# Patient Record
Sex: Female | Born: 1970 | Race: Black or African American | Hispanic: No | Smoking: Never smoker
Health system: Southern US, Community
[De-identification: ages and names within clinical notes are randomized; demographics above are authoritative.]

## PROBLEM LIST (undated history)

## (undated) DIAGNOSIS — Z5189 Encounter for other specified aftercare: Secondary | ICD-10-CM

## (undated) DIAGNOSIS — G4700 Insomnia, unspecified: Secondary | ICD-10-CM

## (undated) DIAGNOSIS — C801 Malignant (primary) neoplasm, unspecified: Secondary | ICD-10-CM

## (undated) DIAGNOSIS — F32A Depression, unspecified: Secondary | ICD-10-CM

## (undated) DIAGNOSIS — R5383 Other fatigue: Secondary | ICD-10-CM

## (undated) DIAGNOSIS — R002 Palpitations: Secondary | ICD-10-CM

## (undated) DIAGNOSIS — M545 Low back pain, unspecified: Secondary | ICD-10-CM

## (undated) DIAGNOSIS — T7840XA Allergy, unspecified, initial encounter: Secondary | ICD-10-CM

## (undated) DIAGNOSIS — K219 Gastro-esophageal reflux disease without esophagitis: Secondary | ICD-10-CM

## (undated) DIAGNOSIS — D649 Anemia, unspecified: Secondary | ICD-10-CM

## (undated) DIAGNOSIS — R251 Tremor, unspecified: Secondary | ICD-10-CM

## (undated) DIAGNOSIS — E876 Hypokalemia: Secondary | ICD-10-CM

## (undated) DIAGNOSIS — R000 Tachycardia, unspecified: Secondary | ICD-10-CM

## (undated) DIAGNOSIS — I10 Essential (primary) hypertension: Secondary | ICD-10-CM

## (undated) DIAGNOSIS — Z6839 Body mass index (BMI) 39.0-39.9, adult: Secondary | ICD-10-CM

## (undated) DIAGNOSIS — J45909 Unspecified asthma, uncomplicated: Secondary | ICD-10-CM

## (undated) DIAGNOSIS — E669 Obesity, unspecified: Secondary | ICD-10-CM

## (undated) DIAGNOSIS — M461 Sacroiliitis, not elsewhere classified: Secondary | ICD-10-CM

## (undated) DIAGNOSIS — R899 Unspecified abnormal finding in specimens from other organs, systems and tissues: Secondary | ICD-10-CM

## (undated) DIAGNOSIS — R7303 Prediabetes: Secondary | ICD-10-CM

## (undated) DIAGNOSIS — N189 Chronic kidney disease, unspecified: Secondary | ICD-10-CM

## (undated) DIAGNOSIS — F419 Anxiety disorder, unspecified: Secondary | ICD-10-CM

## (undated) DIAGNOSIS — R350 Frequency of micturition: Secondary | ICD-10-CM

## (undated) DIAGNOSIS — R011 Cardiac murmur, unspecified: Secondary | ICD-10-CM

## (undated) DIAGNOSIS — Z90710 Acquired absence of both cervix and uterus: Secondary | ICD-10-CM

## (undated) DIAGNOSIS — Z9889 Other specified postprocedural states: Secondary | ICD-10-CM

## (undated) DIAGNOSIS — R112 Nausea with vomiting, unspecified: Secondary | ICD-10-CM

## (undated) DIAGNOSIS — M1990 Unspecified osteoarthritis, unspecified site: Secondary | ICD-10-CM

## (undated) DIAGNOSIS — F329 Major depressive disorder, single episode, unspecified: Secondary | ICD-10-CM

## (undated) HISTORY — DX: Unspecified abnormal finding in specimens from other organs, systems and tissues: R89.9

## (undated) HISTORY — DX: Low back pain: M54.5

## (undated) HISTORY — DX: Gastro-esophageal reflux disease without esophagitis: K21.9

## (undated) HISTORY — DX: Essential (primary) hypertension: I10

## (undated) HISTORY — DX: Encounter for other specified aftercare: Z51.89

## (undated) HISTORY — DX: Hypokalemia: E87.6

## (undated) HISTORY — DX: Acquired absence of both cervix and uterus: Z90.710

## (undated) HISTORY — DX: Unspecified osteoarthritis, unspecified site: M19.90

## (undated) HISTORY — DX: Cardiac murmur, unspecified: R01.1

## (undated) HISTORY — DX: Body mass index (BMI) 39.0-39.9, adult: Z68.39

## (undated) HISTORY — DX: Insomnia, unspecified: G47.00

## (undated) HISTORY — DX: Frequency of micturition: R35.0

## (undated) HISTORY — DX: Allergy, unspecified, initial encounter: T78.40XA

## (undated) HISTORY — DX: Obesity, unspecified: E66.9

## (undated) HISTORY — DX: Low back pain, unspecified: M54.50

## (undated) HISTORY — DX: Chronic kidney disease, unspecified: N18.9

## (undated) HISTORY — DX: Anxiety disorder, unspecified: F41.9

## (undated) HISTORY — DX: Palpitations: R00.2

## (undated) HISTORY — PX: TOOTH EXTRACTION: SUR596

## (undated) HISTORY — PX: COLON SURGERY: SHX602

## (undated) HISTORY — DX: Anemia, unspecified: D64.9

## (undated) HISTORY — DX: Sacroiliitis, not elsewhere classified: M46.1

## (undated) HISTORY — PX: APPENDECTOMY: SHX54

---

## 2016-11-05 ENCOUNTER — Encounter: Payer: Self-pay | Admitting: Cardiology

## 2016-12-06 ENCOUNTER — Ambulatory Visit (INDEPENDENT_AMBULATORY_CARE_PROVIDER_SITE_OTHER): Payer: Medicaid Other | Admitting: Cardiology

## 2016-12-06 ENCOUNTER — Encounter: Payer: Self-pay | Admitting: Cardiology

## 2016-12-06 ENCOUNTER — Encounter (INDEPENDENT_AMBULATORY_CARE_PROVIDER_SITE_OTHER): Payer: Self-pay

## 2016-12-06 VITALS — BP 156/100 | HR 77 | Ht 67.0 in | Wt 271.8 lb

## 2016-12-06 DIAGNOSIS — R002 Palpitations: Secondary | ICD-10-CM

## 2016-12-06 DIAGNOSIS — I10 Essential (primary) hypertension: Secondary | ICD-10-CM | POA: Diagnosis not present

## 2016-12-06 DIAGNOSIS — R400 Somnolence: Secondary | ICD-10-CM

## 2016-12-06 DIAGNOSIS — R0683 Snoring: Secondary | ICD-10-CM | POA: Diagnosis not present

## 2016-12-06 DIAGNOSIS — R001 Bradycardia, unspecified: Secondary | ICD-10-CM | POA: Diagnosis not present

## 2016-12-06 MED ORDER — CARVEDILOL 12.5 MG PO TABS: 12.5000 mg | ORAL_TABLET | Freq: Two times a day (BID) | ORAL | 3 refills | Status: DC

## 2016-12-06 NOTE — Progress Notes (Signed)
Faxed OV notes to PCP. Faxed to Dr. Jason Fila at [facility] at [phone]

## 2016-12-06 NOTE — Progress Notes (Signed)
Electrophysiology Office Note   Date:  [id number]   ID:  XICLALY ELSAESSER, DOB 24-May-1971, MRN [id number]  PCP:  Pcp Not In System Primary Electrophysiologist:  Verdie Barrows Meredith Leeds, MD    Chief Complaint  Patient presents with  . CONSULT    Sinis Bradycardia     History of Present Illness: Erin Bradford is a 45 y.o. female who presents today for electrophysiology evaluation.   She has a history of hypertension, obesity, palpitations, and chronic hypokalemia. She had an electrocardiogram at her primary physician's office that showed sinus rhythm with inferior T-wave inversions. Her heart rate at the time was 58. She says that her blood pressure is been quite difficult to control. She recently went to a nephrologist who put her on Aldactone, as she is also been hypokalemic. She says that she does snore at night, and has been told in the past that she stops breathing when she sleeps. She gets headaches most days, including morning headaches, and does have daytime somnolence.   Today, she denies symptoms of palpitations, chest pain, shortness of breath, orthopnea, PND, lower extremity edema, claudication, dizziness, presyncope, syncope, bleeding, or neurologic sequela. The patient is tolerating medications without difficulties and is otherwise without complaint today.    Past Medical History:  Diagnosis Date  . Abnormal laboratory test   . Anemia   . Anxiety   . BMI 39.0-39.9,adult   . Chronic hypokalemia   . Hypertension   . Insomnia   . Low back pain   . Obesity   . Palpitations    Past Surgical History:  Procedure Laterality Date  . CESAREAN SECTION    . TOOTH EXTRACTION       Current Outpatient Prescriptions  Medication Sig Dispense Refill  . buPROPion (WELLBUTRIN XL) 150 MG 24 hr tablet TAKE TWO TABLETS BY MOUTH  DAILY    . busPIRone (BUSPAR) 5 MG tablet Take 5 mg by mouth daily.    . cetirizine (ZYRTEC) 10 MG tablet Take 10 mg by mouth daily.    Marland Kitchen FERROUS  FUMARATE ER PO Take 50 mg by mouth daily.    . furosemide (LASIX) 20 MG tablet Take 20 mg by mouth daily.    Marland Kitchen losartan (COZAAR) 100 MG tablet Take 100 mg by mouth daily.    . methocarbamol (ROBAXIN) 750 MG tablet Take 750 mg by mouth.    . potassium chloride SA (K-DUR,KLOR-CON) 20 MEQ tablet Take 20 mEq by mouth daily.    Marland Kitchen spironolactone (ALDACTONE) 12.5 mg TABS tablet Take 12.5 mg by mouth 2 (two) times daily.    . traMADol (ULTRAM) 50 MG tablet Take 50 mg by mouth 2 (two) times daily.    Marland Kitchen zolpidem (AMBIEN) 10 MG tablet Take 10 mg by mouth at bedtime as needed for sleep.    . carvedilol (COREG) 12.5 MG tablet Take 1 tablet (12.5 mg total) by mouth 2 (two) times daily. 60 tablet 3   No current facility-administered medications for this visit.     Allergies:   Ace inhibitors   Social History:  The patient  reports that she has never smoked. She has never used smokeless tobacco. She reports that she does not drink alcohol or use drugs.   Family History:  The patient's family history includes Atrial fibrillation in her sister; Cancer in her maternal grandfather; Diabetes in her maternal grandmother and mother; Hyperlipidemia in her mother; Hypertension in her maternal grandmother and mother; Kidney disease in her father and maternal  grandmother; Pancreatic cancer in her maternal grandmother; Sarcoidosis in her mother.    ROS:  Please see the history of present illness.   Otherwise, review of systems is positive for none.   All other systems are reviewed and negative.    PHYSICAL EXAM: VS:  BP (!) 156/100   Pulse 77   Ht 5\' 7"  (1.702 m)   Wt 271 lb 12.8 oz (123.3 kg)   BMI 42.57 kg/m  , BMI Body mass index is 42.57 kg/m. GEN: Well nourished, well developed, in no acute distress  HEENT: normal  Neck: no JVD, carotid bruits, or masses Cardiac: RRR; no murmurs, rubs, or gallops,no edema  Respiratory:  clear to auscultation bilaterally, normal work of breathing GI: soft, nontender,  nondistended, + BS MS: no deformity or atrophy  Skin: warm and dry,  Neuro:  Strength and sensation are intact Psych: euthymic mood, full affect  EKG:  EKG is ordered today. Personal review of the ekg ordered shows sinus rhythm, rate 77  Recent Labs: No results found for requested labs within last 8760 hours.    Lipid Panel  No results found for: CHOL, TRIG, HDL, CHOLHDL, VLDL, LDLCALC, LDLDIRECT   Wt Readings from Last 3 Encounters:  12/06/16 271 lb 12.8 oz (123.3 kg)      Other studies Reviewed: Additional studies/ records that were reviewed today include: PCP notes  ASSESSMENT AND PLAN:  1.  Palpitations: Currently happened for up to 30 seconds at a time a few times a week. We'll place a two-week monitor for further determine the cause of her palpitations.  2. Hypertension: Blood pressure is elevated today, but apparently improved since she saw her neurologist, who put her on Aldactone. Erin Bradford start her on carvedilol 12.5 mg twice a day, which may help to improve her blood pressure. She has been relatively bradycardic in the past, but according to her she did not have any symptoms from this.  3. Snoring: She does have risk factors for sleep apnea including snoring, obesity, daytime somnolence, headaches. She is also been told that she stops breathing when she sleeps. Erin Bradford order a sleep study. If she does have sleep apnea, therapy may help improve her blood pressure.  Current medicines are reviewed at length with the patient today.   The patient does not have concerns regarding her medicines.  The following changes were made today:  Coreg 12.5 mg BID  Labs/ tests ordered today include:  Orders Placed This Encounter  Procedures  . Cardiac event monitor  . EKG 12-Lead  . Split night study     Disposition:   FU with Erin Bradford 6 weeks  Signed, Erin Bradford Meredith Leeds, MD  12/06/2016 10:50 AM     [facility] [address] [place]  [id number] [phone] (office) [phone] (fax).inic

## 2016-12-06 NOTE — Patient Instructions (Addendum)
Medication Instructions:   Start Carvedilol 12.5 mg twice daily  --- If you need a refill on your cardiac medications before your next appointment, please call your pharmacy. ---  Labwork:  None ordered  Testing/Procedures: Your physician has recommended that you wear an event monitor - 2 weeks. Event monitors are medical devices that record the heart's electrical activity. Doctors most often [place] these monitors to diagnose arrhythmias. Arrhythmias are problems with the speed or rhythm of the heartbeat. The monitor is a small, portable device. You can wear one while you do your normal daily activities. This is usually used to diagnose what is causing palpitations/syncope (passing out).  Your physician has recommended that you have a sleep study. This test records several body functions during sleep, including: brain activity, eye movement, oxygen and carbon dioxide blood levels, heart rate and rhythm, breathing rate and rhythm, the flow of air through your mouth and nose, snoring, body muscle movements, and chest and belly movement.    Follow-Up: Your physician wants you to follow-up in:6 weeks with Dr. Curt Bears.    Thank you for choosing [facility]!!   Erin Curet, RN [phone]   Any Other Special Instructions Will Be Listed Below (If Applicable).  Cardiac Event Monitoring A cardiac event monitor is a small recording device used to help detect abnormal heart rhythms (arrhythmias). The monitor is used to record heart rhythm when noticeable symptoms such as the following occur:  Fast heartbeats (palpitations), such as heart racing or fluttering.  Dizziness.  Fainting or light-headedness.  Unexplained weakness. The monitor is wired to two electrodes placed on your chest. Electrodes are flat, sticky disks that attach to your skin. The monitor can be worn for up to 30 days. You will wear the monitor at all times, except when bathing. How to use your cardiac event monitor A  technician will prepare your chest for the electrode placement. The technician will show you how to place the electrodes, how to work the monitor, and how to replace the batteries. Take time to practice using the monitor before you leave the office. Make sure you understand how to send the information from the monitor to your health care provider. This requires a telephone with a landline, not a cell phone. You need to:  Wear your monitor at all times, except when you are in water:  Do not get the monitor wet.  Take the monitor off when bathing. Do not swim or use a hot tub with it on.  Keep your skin clean. Do not put body lotion or moisturizer on your chest.  Change the electrodes daily or any time they stop sticking to your skin. You might need to use tape to keep them on.  It is possible that your skin under the electrodes could become irritated. To keep this from happening, try to put the electrodes in slightly different places on your chest. However, they must remain in the area under your left breast and in the upper right section of your chest.  Make sure the monitor is safely clipped to your clothing or in a location close to your body that your health care provider recommends.  Press the button to record when you feel symptoms of heart trouble, such as dizziness, weakness, light-headedness, palpitations, thumping, shortness of breath, unexplained weakness, or a fluttering or racing heart. The monitor is always on and records what happened slightly before you pressed the button, so do not worry about being too late to get good information.  Keep a diary of your activities, such as walking, doing chores, and taking medicine. It is especially important to note what you were doing when you pushed the button to record your symptoms. This will help your health care provider determine what might be contributing to your symptoms. The information stored in your monitor will be reviewed by your  health care provider alongside your diary entries.  Send the recorded information as recommended by your health care provider. It is important to understand that it will take some time for your health care provider to process the results.  Change the batteries as recommended by your health care provider. Get help right away if:  You have chest pain.  You have extreme difficulty breathing or shortness of breath.  You develop a very fast heartbeat that persists.  You develop dizziness that does not go away.  You faint or constantly feel you are about to faint. This information is not intended to replace advice given to you by your health care provider. Make sure you discuss any questions you have with your health care provider. Document Released: 09/21/2008 Document Revised: 05/20/2016 Document Reviewed: 06/11/2013 Elsevier Interactive Patient Education  2017 [employer].    Sleep Studies A sleep study (polysomnogram) is a series of tests done while you are sleeping. It can show how well you sleep. This can help your health care provider diagnose a sleep disorder and show how severe your sleep disorder is. A sleep study may lead to treatment that will help you sleep better and prevent other medical problems caused by poor sleep. If you have a sleep disorder, you may also be at risk for: Sleep-related accidents. High blood pressure. Heart disease. Stroke. Other medical conditions. Sleep disorders are common. Your health care provider may suspect a sleep disorder if you: Have loud snoring most nights. Have brief periods when you stop breathing at night. Feel sleepy on most days. Fall asleep suddenly during the day. Have trouble falling asleep or staying asleep. Feel like you need to move your legs when trying to fall asleep. Have dreams that seem very real shortly after falling asleep. Feel like you cannot move when you first wake up. Which tests will I need to have? Most sleep  studies last all night and include these tests: Recordings of your brain activity. Recordings of your eye movements. Recording of your heart rate and rhythm. Blood pressure readings. Readings of the amount of oxygen in your blood. Measurements of your chest and belly movement as you breathe during sleep. If you have signs of the sleep disorder called sleep apnea during your test, you may get a mask to wear for the second half of the night. The mask provides continuous positive airway pressure (CPAP). This may improve sleep apnea significantly. You will then have all tests done again with the mask in place to see if your measurements and recordings change. How are sleep studies done? Most sleep studies are done over one full night of sleep. You will arrive at the study center in the evening and can go home in the morning. Bring your pajamas and toothbrush. Do not have caffeine on the day of your sleep study. Your health care provider will let you know if you need to stop taking any of your regular medicines before the test. To do the tests included in a polysomnogram, you will have: Round, sticky patches with sensors attached to recording wires (electrodes) placed on your scalp, face, chest, and limbs. Wires from  all the electrodes and sensors run from your bed to a computer. The wires can be taken off and put back on if you need to get out of bed to go to the bathroom. A sensor placed over your nose to measure airflow. A finger clip put on one finger to measure your blood oxygen level. A belt around your belly and a belt around your chest to measure breathing movements. Where are sleep studies done? Sleep studies are done at sleep centers. A sleep center may be inside a hospital, office, or clinic. The room where you have the study may look like a hospital room or a hotel room. The health care providers doing the study may come in and out of the room during the study. Most of the time, they  will be in another room monitoring your test. How is information from sleep studies helpful? A polysomnogram can be used along with your medical history and a physical exam to diagnose conditions, such as: Sleep apnea. Restless legs syndrome. Sleep-related seizure disorders. Sleep-related movement disorders. A medical doctor who specializes in sleep will evaluate your sleep study. The specialist will share the results with your primary health care provider. Treatments based on your sleep study may include: Improving your sleep habits (sleep hygiene). Wearing a CPAP mask. Wearing an oral device at night to improve breathing and reduce snoring. Taking medicine for: Restless legs syndrome. Sleep-related seizure disorder. Sleep-related movement disorder. This information is not intended to replace advice given to you by your health care provider. Make sure you discuss any questions you have with your health care provider. Document Released: 06/19/2003 Document Revised: 08/08/2016 Document Reviewed: 02/18/2014 Elsevier Interactive Patient Education  2017 [employer].

## 2016-12-09 ENCOUNTER — Telehealth: Payer: Self-pay | Admitting: Oncology

## 2016-12-09 ENCOUNTER — Encounter: Payer: Self-pay | Admitting: Oncology

## 2016-12-09 NOTE — Telephone Encounter (Signed)
Pt confirmed appt, verified demo and insurance, mailed pt letter, fax  referring provider with appt date/time.

## 2016-12-10 ENCOUNTER — Encounter: Payer: Self-pay | Admitting: Cardiology

## 2016-12-10 NOTE — Telephone Encounter (Signed)
Advised pt it is not abnormal to have some fatigue/weakenss/dizziness when first starting a beta blocker.  We discussed that she experienced this before starting but after starting it "felt different than before and that worried me". Discussed medication and that with time usually (but not always) these initial symptoms subside. We discussed normal BP numbers also.  She was unaware of what normal is.  She also realized while we were on the phone that she had been taking in once daily and not BID. Pt is going to continue taking it, correctly, and I am going to follow up with her next week to see how she is doing.  If no better then we will discuss possible switching  medications. She thanks me for calling and setting her mind at ease.

## 2016-12-14 ENCOUNTER — Ambulatory Visit (INDEPENDENT_AMBULATORY_CARE_PROVIDER_SITE_OTHER): Payer: Medicaid Other

## 2016-12-14 ENCOUNTER — Telehealth: Payer: Self-pay | Admitting: Cardiology

## 2016-12-14 DIAGNOSIS — R002 Palpitations: Secondary | ICD-10-CM

## 2016-12-14 NOTE — Telephone Encounter (Signed)
Advised pt to continue normal ADLs/exercising while wearing monitor. She thanks me for calling.

## 2016-12-14 NOTE — Telephone Encounter (Signed)
Erin Bradford is calling to find is it ok to exercise with the heart monitor on . Please call . Thanks

## 2016-12-24 NOTE — Telephone Encounter (Signed)
Follow up with patient.  She reports BPs are better.  Last was 124/82. Fatigue/weakness slightly improved. Pt will continue to monitor and call office if there is a change or fatigue worsens.  Otherwise she will keep follow up in February. She thanks me for calling and checking in.

## 2017-01-03 ENCOUNTER — Encounter: Payer: Self-pay | Admitting: Oncology

## 2017-01-03 ENCOUNTER — Telehealth: Payer: Self-pay | Admitting: Oncology

## 2017-01-03 NOTE — Telephone Encounter (Signed)
Appt has been rescheduled to 2/9 at 2pm. Provider will be on-call on 1/19. Pt agreed to the appt date and time. Letter mailed

## 2017-01-07 ENCOUNTER — Telehealth: Payer: Self-pay | Admitting: *Deleted

## 2017-01-07 NOTE — Telephone Encounter (Signed)
Called to move appt to sooner date.  Pt took a look at her schedule and is "stretched".  She will keep currently scheduled appt on 2/2 and call if needs to be seen sooner.  She thanks me for my help  Notes Recorded by Stanton Kidney, RN on 01/07/2017 at 9:24 AM EST Pt would like OV to discuss further with Dr. Curt Bears. Will call pt later today to arrange sooner OV. ------  Notes Recorded by Stanton Kidney, RN on 12/31/2016 at 9:59 AM EST Reviewed results with patient who verbalized understanding. Pt still having daily palpitations that she feels. Will discuss with Camnitz further and call pt back with updated plan of care/recommendations and/or sooner follow up appt. Pt is agreeable to plan and understands it may be next week before calling her back. ------  Notes Recorded by Will Meredith Leeds, MD on 12/31/2016 at 9:37 AM EST Monitor without arrhythmia. Palpitations associated with sinus rhythm.

## 2017-01-14 ENCOUNTER — Encounter: Payer: Medicaid Other | Admitting: Oncology

## 2017-01-25 ENCOUNTER — Ambulatory Visit: Payer: Self-pay | Admitting: Neurology

## 2017-01-25 DIAGNOSIS — IMO0001 Reserved for inherently not codable concepts without codable children: Secondary | ICD-10-CM | POA: Insufficient documentation

## 2017-01-25 DIAGNOSIS — E669 Obesity, unspecified: Secondary | ICD-10-CM | POA: Diagnosis not present

## 2017-01-25 DIAGNOSIS — Z6841 Body Mass Index (BMI) 40.0 and over, adult: Secondary | ICD-10-CM | POA: Diagnosis not present

## 2017-01-25 DIAGNOSIS — M545 Low back pain: Secondary | ICD-10-CM

## 2017-01-25 DIAGNOSIS — R251 Tremor, unspecified: Secondary | ICD-10-CM | POA: Insufficient documentation

## 2017-01-25 DIAGNOSIS — R600 Localized edema: Secondary | ICD-10-CM | POA: Diagnosis not present

## 2017-01-25 DIAGNOSIS — G8929 Other chronic pain: Secondary | ICD-10-CM | POA: Insufficient documentation

## 2017-01-25 DIAGNOSIS — I10 Essential (primary) hypertension: Secondary | ICD-10-CM | POA: Diagnosis not present

## 2017-01-25 DIAGNOSIS — F329 Major depressive disorder, single episode, unspecified: Secondary | ICD-10-CM | POA: Diagnosis not present

## 2017-01-26 ENCOUNTER — Telehealth: Payer: Self-pay | Admitting: Cardiology

## 2017-01-26 ENCOUNTER — Encounter (HOSPITAL_BASED_OUTPATIENT_CLINIC_OR_DEPARTMENT_OTHER): Payer: Medicaid Other

## 2017-01-26 NOTE — Telephone Encounter (Signed)
Pt called to rs 01-28-17 appt with Camnitz, however she requested 2-13 or 2-16 and he is not in either of those days. She then requested to be transferred to the nurse-I tols her I would have the nurse call her back-she agreed-pls call [phone]

## 2017-01-26 NOTE — Telephone Encounter (Signed)
Pt unable to make appt on Friday and unable to schedule appt in the near future.  She is asking if Camnitz would recommend a medication for her tachy episodes/palpitations. I will forward to him for his review and call her this week w/ his response.  Pt understands and is agreeable.

## 2017-01-27 NOTE — Telephone Encounter (Signed)
Informed patient Dr. Curt Bears needs to meet with her in the office before prescribing any medication.  Pt understands and is agreeable.  She cannot make appt tomorrow -- she is re-scheduled for 2/20.

## 2017-01-28 ENCOUNTER — Ambulatory Visit: Payer: Medicaid Other | Admitting: Cardiology

## 2017-01-29 ENCOUNTER — Encounter: Payer: Self-pay | Admitting: Neurology

## 2017-01-31 ENCOUNTER — Encounter (HOSPITAL_BASED_OUTPATIENT_CLINIC_OR_DEPARTMENT_OTHER): Payer: Medicaid Other

## 2017-02-04 ENCOUNTER — Ambulatory Visit (HOSPITAL_BASED_OUTPATIENT_CLINIC_OR_DEPARTMENT_OTHER): Payer: Medicaid Other | Admitting: Oncology

## 2017-02-04 ENCOUNTER — Ambulatory Visit (HOSPITAL_BASED_OUTPATIENT_CLINIC_OR_DEPARTMENT_OTHER): Payer: Medicaid Other

## 2017-02-04 ENCOUNTER — Telehealth: Payer: Self-pay | Admitting: *Deleted

## 2017-02-04 ENCOUNTER — Telehealth: Payer: Self-pay | Admitting: Oncology

## 2017-02-04 DIAGNOSIS — D500 Iron deficiency anemia secondary to blood loss (chronic): Secondary | ICD-10-CM

## 2017-02-04 DIAGNOSIS — E876 Hypokalemia: Secondary | ICD-10-CM | POA: Diagnosis not present

## 2017-02-04 DIAGNOSIS — D509 Iron deficiency anemia, unspecified: Secondary | ICD-10-CM | POA: Insufficient documentation

## 2017-02-04 DIAGNOSIS — N920 Excessive and frequent menstruation with regular cycle: Secondary | ICD-10-CM

## 2017-02-04 DIAGNOSIS — I10 Essential (primary) hypertension: Secondary | ICD-10-CM

## 2017-02-04 LAB — COMPREHENSIVE METABOLIC PANEL
ALBUMIN: 3.8 g/dL (ref 3.5–5.0)
ALT: 20 U/L (ref 0–55)
AST: 17 U/L (ref 5–34)
Alkaline Phosphatase: 90 U/L (ref 40–150)
Anion Gap: 7 mEq/L (ref 3–11)
BUN: 8.9 mg/dL (ref 7.0–26.0)
CHLORIDE: 105 meq/L (ref 98–109)
CO2: 26 mEq/L (ref 22–29)
CREATININE: 1 mg/dL (ref 0.6–1.1)
Calcium: 9.2 mg/dL (ref 8.4–10.4)
EGFR: 80 mL/min/{1.73_m2} — ABNORMAL LOW (ref >90)
GLUCOSE: 85 mg/dL (ref 70–140)
POTASSIUM: 3.8 meq/L (ref 3.5–5.1)
SODIUM: 138 meq/L (ref 136–145)
Total Bilirubin: <0.22 mg/dL (ref 0.20–1.20)
Total Protein: 6.8 g/dL (ref 6.4–8.3)

## 2017-02-04 LAB — CBC WITH DIFFERENTIAL/PLATELET
BASO%: 0.3 % (ref 0.0–2.0)
BASOS ABS: 0 10*3/uL (ref 0.0–0.1)
EOS%: 2.3 % (ref 0.0–7.0)
Eosinophils Absolute: 0.2 10*3/uL (ref 0.0–0.5)
HEMATOCRIT: 33.9 % — AB (ref 34.8–46.6)
HEMOGLOBIN: 11 g/dL — AB (ref 11.6–15.9)
LYMPH#: 2.6 10*3/uL (ref 0.9–3.3)
LYMPH%: 29.8 % (ref 14.0–49.7)
MCH: 28.3 pg (ref 25.1–34.0)
MCHC: 32.4 g/dL (ref 31.5–36.0)
MCV: 87.1 fL (ref 79.5–101.0)
MONO#: 1.1 10*3/uL — ABNORMAL HIGH (ref 0.1–0.9)
MONO%: 13 % (ref 0.0–14.0)
NEUT#: 4.7 10*3/uL (ref 1.5–6.5)
NEUT%: 54.6 % (ref 38.4–76.8)
Platelets: 433 10*3/uL — ABNORMAL HIGH (ref 145–400)
RBC: 3.89 10*6/uL (ref 3.70–5.45)
RDW: 14.9 % — AB (ref 11.2–14.5)
WBC: 8.6 10*3/uL (ref 3.9–10.3)

## 2017-02-04 NOTE — Telephone Encounter (Signed)
Per 2/9 LOS I have scheduled appats and notified the scheduler

## 2017-02-04 NOTE — Progress Notes (Signed)
Reason for Referral: Anemia.   HPI: 46 year old woman currently of [place] where she has relocated recently. She lived in multiple areas including [place] and [place]. She was seen by hematology while she was living in [place] for the management of iron deficiency anemia. At that time she was noted to have iron deficiency related to menorrhagia. She was prescribed oral iron although it has not been very successful in treating her anemia. In the interim, she relocated to [facility] and light established follow-up locally. She reports that her symptoms of anemia have been chronic and dating back from last 30 years. Her menstrual cycles have been heavy since her last pregnancy 27 years ago. She has been intermittently on oral iron although never required transfusions or IV iron infusion. Most recently, she is reporting heavier menstrual cycles with menorrhagia related to uterine fibroids. She does report fatigue, tiredness and occasional headaches. She does not report any chest pain but does report some occasional dyspnea exertion. She does report some occasional dizziness. She denies any hematochezia or melena.  She does not report any blurry vision, syncope or seizures. She does not report any fevers, chills, sweats or weight loss. She does not report any chest pain, palpitation, orthopnea or leg edema. She does not report any cough, wheezing or hemoptysis. She does not report any nausea, vomiting or abdominal pain. She does not report any frequency urgency or hesitancy. She does not report any skeletal complaints. Remaining review of systems unremarkable.   Past Medical History:  Diagnosis Date  . Abnormal laboratory test   . Anemia   . Anxiety   . BMI 39.0-39.9,adult   . Chronic hypokalemia   . Hypertension   . Insomnia   . Low back pain   . Obesity   . Palpitations   :  Past Surgical History:  Procedure Laterality Date  . CESAREAN SECTION    . TOOTH EXTRACTION     :   Current Outpatient Prescriptions:  .  buPROPion (WELLBUTRIN XL) 150 MG 24 hr tablet, TAKE TWO TABLETS BY MOUTH  DAILY, Disp: , Rfl:  .  carvedilol (COREG) 12.5 MG tablet, Take 1 tablet (12.5 mg total) by mouth 2 (two) times daily., Disp: 60 tablet, Rfl: 3 .  cetirizine (ZYRTEC) 10 MG tablet, Take 10 mg by mouth daily., Disp: , Rfl:  .  FeFum-FePoly-FA-B Cmp-C-Biot (INTEGRA PLUS PO), Take 1 tablet by mouth daily., Disp: , Rfl:  .  FERROUS FUMARATE ER PO, Take 50 mg by mouth daily., Disp: , Rfl:  .  furosemide (LASIX) 20 MG tablet, Take 20 mg by mouth daily., Disp: , Rfl:  .  losartan (COZAAR) 100 MG tablet, Take 100 mg by mouth daily., Disp: , Rfl:  .  methocarbamol (ROBAXIN) 750 MG tablet, Take 750 mg by mouth., Disp: , Rfl:  .  potassium chloride SA (K-DUR,KLOR-CON) 20 MEQ tablet, Take 20 mEq by mouth daily., Disp: , Rfl:  .  sertraline (ZOLOFT) 50 MG tablet, Take 1 tablet by mouth daily., Disp: , Rfl:  .  spironolactone (ALDACTONE) 12.5 mg TABS tablet, Take 12.5 mg by mouth 2 (two) times daily., Disp: , Rfl:  .  traMADol (ULTRAM) 50 MG tablet, Take 50 mg by mouth 2 (two) times daily., Disp: , Rfl:  .  traZODone (DESYREL) 100 MG tablet, Take 1 tablet by mouth at bedtime., Disp: , Rfl: :  Allergies  Allergen Reactions  . Ace Inhibitors Anaphylaxis and Swelling    Swelling of tongue.  :  Family History  Problem Relation Age of Onset  . Diabetes Mother   . Hypertension Mother   . Hyperlipidemia Mother   . Sarcoidosis Mother   . Kidney disease Father   . Atrial fibrillation Sister   . Kidney disease Maternal Grandmother   . Pancreatic cancer Maternal Grandmother   . Diabetes Maternal Grandmother   . Hypertension Maternal Grandmother   . Cancer Maternal Grandfather   :  Social History   Social History  . Marital status: Married    Spouse name: N/A  . Number of children: N/A  . Years of education: N/A   Occupational History  . Not on file.   Social History Main  Topics  . Smoking status: Never Smoker  . Smokeless tobacco: Never Used  . Alcohol use No  . Drug use: No  . Sexual activity: No   Other Topics Concern  . Not on file   Social History Narrative  . No narrative on file  :  Pertinent items are noted in HPI.  Exam: Blood pressure (!) 141/86, pulse 81, temperature 98.4 F (36.9 C), temperature source Oral, resp. rate 20, height 5\' 7"  (1.702 m), weight 272 lb 9.6 oz (123.7 kg), SpO2 99 %.  ECOG 0.  General appearance: alert and cooperative appeared without distress. Head: Normocephalic, without obvious abnormality Throat: lips, mucosa, and tongue normal; teeth and gums normal Neck: no adenopathy Back: negative Resp: clear to auscultation bilaterally Chest wall: no tenderness Cardio: regular rate and rhythm, S1, S2 normal, no murmur, click, rub or gallop GI: soft, non-tender; bowel sounds normal; no masses,  no organomegaly  extremities: no edema    Assessment and Plan:   46 year old woman with the following issues:  1. Iron deficiency anemia: Her diagnosis has been chronic in nature and related to menstrual blood losses. She has menorrhagia related to uterine fibroids and have been on oral iron replacement but has been unsuccessful.  Other causes of anemia were reviewed today which includes anemia related to renal insufficiency, hemoglobinopathy among other causes. GI blood losses are not noted at this time per her report.  Management standpoint, I have recommended repeating her CBC and iron studies and document her recent iron status. She has been on oral iron and her iron levels continues to be low, I discussed IV iron with her as an option. Risks and benefits of Feraheme infusion were reviewed today. Complications include arthralgias, myalgias and infusion related complications were discussed. Anaphylaxis has been noted but has been rare.  After discussion today, she is agreeable to receive IV iron if iron deficiency is  documented. I recommended continuing oral iron and she is doing.  2. Menorrhagia: I have urged her to establish care with endocrinology to address her chronic menstrual blood losses. She understands she might require hysterectomy at worse case scenario.  3. Follow-up: Will be in 3 months to repeat iron studies

## 2017-02-04 NOTE — Telephone Encounter (Signed)
Appointments scheduled per 2/9 LOS. Patient given AVS report and calendars with future scheduled appointments.  °

## 2017-02-07 ENCOUNTER — Telehealth: Payer: Self-pay | Admitting: Oncology

## 2017-02-07 LAB — IRON AND TIBC
%SAT: 8 % — ABNORMAL LOW (ref 21–57)
IRON: 31 ug/dL — AB (ref 41–142)
TIBC: 371 ug/dL (ref 236–444)
UIBC: 340 ug/dL (ref 120–384)

## 2017-02-07 LAB — FERRITIN: FERRITIN: 31 ng/mL (ref 9–269)

## 2017-02-07 NOTE — Telephone Encounter (Signed)
Pt called to r/s 2/15 and 2/22 appts to 2/16 and 2/23 at 3 pm

## 2017-02-10 ENCOUNTER — Ambulatory Visit: Payer: Medicaid Other

## 2017-02-11 ENCOUNTER — Ambulatory Visit: Payer: Medicaid Other

## 2017-02-11 ENCOUNTER — Encounter: Payer: Self-pay | Admitting: Oncology

## 2017-02-11 ENCOUNTER — Telehealth: Payer: Self-pay | Admitting: *Deleted

## 2017-02-11 NOTE — Telephone Encounter (Signed)
Per patient request I have moved her appt from today to Monday

## 2017-02-14 ENCOUNTER — Ambulatory Visit (HOSPITAL_BASED_OUTPATIENT_CLINIC_OR_DEPARTMENT_OTHER): Payer: Medicaid Other

## 2017-02-14 VITALS — BP 142/85 | HR 67 | Temp 98.7°F | Resp 20

## 2017-02-14 DIAGNOSIS — N920 Excessive and frequent menstruation with regular cycle: Secondary | ICD-10-CM | POA: Diagnosis not present

## 2017-02-14 DIAGNOSIS — D500 Iron deficiency anemia secondary to blood loss (chronic): Secondary | ICD-10-CM

## 2017-02-14 MED ORDER — SODIUM CHLORIDE 0.9 % IV SOLN
Freq: Once | INTRAVENOUS | Status: AC
  Administered 2017-02-14: 10:00:00 via INTRAVENOUS

## 2017-02-14 MED ORDER — SODIUM CHLORIDE 0.9 % IV SOLN
510.0000 mg | Freq: Once | INTRAVENOUS | Status: AC
  Administered 2017-02-14: 510 mg via INTRAVENOUS
  Filled 2017-02-14: qty 17

## 2017-02-14 NOTE — Patient Instructions (Signed)
Ferumoxytol injection What is this medicine? FERUMOXYTOL is an iron complex. Iron is used to make healthy red blood cells, which carry oxygen and nutrients throughout the body. This medicine is used to treat iron deficiency anemia in people with chronic kidney disease. COMMON BRAND NAME(S): Feraheme What should I tell my health care provider before I take this medicine? They need to know if you have any of these conditions: -anemia not caused by low iron levels -high levels of iron in the blood -magnetic resonance imaging (MRI) test scheduled -an unusual or allergic reaction to iron, other medicines, foods, dyes, or preservatives -pregnant or trying to get pregnant -breast-feeding How should I use this medicine? This medicine is for injection into a vein. It is given by a health care professional in a hospital or clinic setting. Talk to your pediatrician regarding the use of this medicine in children. Special care may be needed. What if I miss a dose? It is important not to miss your dose. Call your doctor or health care professional if you are unable to keep an appointment. What may interact with this medicine? This medicine may interact with the following medications: -other iron products What should I watch for while using this medicine? Visit your doctor or healthcare professional regularly. Tell your doctor or healthcare professional if your symptoms do not start to get better or if they get worse. You may need blood work done while you are taking this medicine. You may need to follow a special diet. Talk to your doctor. Foods that contain iron include: whole grains/cereals, dried fruits, beans, or peas, leafy green vegetables, and organ meats (liver, kidney). What side effects may I notice from receiving this medicine? Side effects that you should report to your doctor or health care professional as soon as possible: -allergic reactions like skin rash, itching or hives, swelling of the  face, lips, or tongue -breathing problems -changes in blood pressure -feeling faint or lightheaded, falls -fever or chills -flushing, sweating, or hot feelings -swelling of the ankles or feet Side effects that usually do not require medical attention (report to your doctor or health care professional if they continue or are bothersome): -diarrhea -headache -nausea, vomiting -stomach pain Where should I keep my medicine? This drug is given in a hospital or clinic and will not be stored at home.  2017 Elsevier/Gold Standard (2016-01-15 12:41:49)  

## 2017-02-15 ENCOUNTER — Encounter: Payer: Self-pay | Admitting: Cardiology

## 2017-02-15 ENCOUNTER — Ambulatory Visit (INDEPENDENT_AMBULATORY_CARE_PROVIDER_SITE_OTHER): Payer: Medicaid Other | Admitting: Cardiology

## 2017-02-15 VITALS — BP 128/98 | HR 82 | Ht 66.5 in | Wt 267.8 lb

## 2017-02-15 DIAGNOSIS — R002 Palpitations: Secondary | ICD-10-CM

## 2017-02-15 NOTE — Progress Notes (Signed)
Electrophysiology Office Note   Date:  [id number]   ID:  Erin Bradford, DOB 1971-07-20, MRN [id number]  PCP:  Pcp Not In System Primary Electrophysiologist:  Anne Boltz Meredith Leeds, MD    Chief Complaint  Patient presents with  . Follow-up    Palpitations     History of Present Illness: Erin Bradford is a 46 y.o. female who presents today for electrophysiology evaluation.   She has a history of hypertension, obesity, palpitations, and chronic hypokalemia. She had an electrocardiogram at her primary physician's office that showed sinus rhythm with inferior T-wave inversions.    Today, she denies symptoms of palpitations, chest pain, shortness of breath, orthopnea, PND, lower extremity edema, claudication, dizziness, presyncope, syncope, bleeding, or neurologic sequela. The patient is tolerating medications without difficulties and is otherwise without complaint today. She says that she has been feeling well, other than the fact that she is a little fatigued. She was found to have a low iron level and had an infusion yesterday. Otherwise, she says that she has much fewer episodes of her palpitations. She also says that the carvedilol was added as well as helped her blood pressure.   Past Medical History:  Diagnosis Date  . Abnormal laboratory test   . Anemia   . Anxiety   . BMI 39.0-39.9,adult   . Chronic hypokalemia   . Hypertension   . Insomnia   . Low back pain   . Obesity   . Palpitations    Past Surgical History:  Procedure Laterality Date  . CESAREAN SECTION    . TOOTH EXTRACTION       Current Outpatient Prescriptions  Medication Sig Dispense Refill  . buPROPion (WELLBUTRIN XL) 150 MG 24 hr tablet TAKE TWO TABLETS BY MOUTH  DAILY    . carvedilol (COREG) 12.5 MG tablet Take 1 tablet (12.5 mg total) by mouth 2 (two) times daily. 60 tablet 3  . cetirizine (ZYRTEC) 10 MG tablet Take 10 mg by mouth daily.    Marland Kitchen FeFum-FePoly-FA-B Cmp-C-Biot (INTEGRA PLUS PO) Take  1 tablet by mouth daily.    Marland Kitchen FERROUS FUMARATE ER PO Take 50 mg by mouth daily.    . furosemide (LASIX) 20 MG tablet Take 20 mg by mouth daily.    Marland Kitchen losartan (COZAAR) 100 MG tablet Take 100 mg by mouth daily.    . methocarbamol (ROBAXIN) 750 MG tablet Take 750 mg by mouth.    . potassium chloride SA (K-DUR,KLOR-CON) 20 MEQ tablet Take 20 mEq by mouth daily.    . sertraline (ZOLOFT) 50 MG tablet Take 1 tablet by mouth daily.    Marland Kitchen spironolactone (ALDACTONE) 12.5 mg TABS tablet Take 12.5 mg by mouth 2 (two) times daily.    . traMADol (ULTRAM) 50 MG tablet Take 50 mg by mouth 2 (two) times daily.    Marland Kitchen zolpidem (AMBIEN) 10 MG tablet Take 10 mg by mouth at bedtime.   1   No current facility-administered medications for this visit.     Allergies:   Ace inhibitors   Social History:  The patient  reports that she has never smoked. She has never used smokeless tobacco. She reports that she does not drink alcohol or use drugs.   Family History:  The patient's family history includes Atrial fibrillation in her sister; Cancer in her maternal grandfather; Diabetes in her maternal grandmother and mother; Hyperlipidemia in her mother; Hypertension in her maternal grandmother and mother; Kidney disease in her father and maternal grandmother; Pancreatic  cancer in her maternal grandmother; Sarcoidosis in her mother.    ROS:  Please see the history of present illness.   Otherwise, review of systems is positive for none.   All other systems are reviewed and negative.    PHYSICAL EXAM: VS:  BP (!) 128/98   Pulse 82   Ht 5' 6.5" (1.689 m)   Wt 267 lb 12.8 oz (121.5 kg)   BMI 42.58 kg/m  , BMI Body mass index is 42.58 kg/m. GEN: Well nourished, well developed, in no acute distress  HEENT: normal  Neck: no JVD, carotid bruits, or masses Cardiac: RRR; no murmurs, rubs, or gallops,no edema  Respiratory:  clear to auscultation bilaterally, normal work of breathing GI: soft, nontender, nondistended, +  BS MS: no deformity or atrophy  Skin: warm and dry,  Neuro:  Strength and sensation are intact Psych: euthymic mood, full affect  EKG:  EKG is not ordered today. Personal review of the ekg ordered 12/06/16 shows sinus rhythm, rate 77   Recent Labs: 02/04/2017: ALT 20; BUN 8.9; Creatinine 1.0; HGB 11.0; Platelets 433; Potassium 3.8; Sodium 138    Lipid Panel  No results found for: CHOL, TRIG, HDL, CHOLHDL, VLDL, LDLCALC, LDLDIRECT   Wt Readings from Last 3 Encounters:  02/15/17 267 lb 12.8 oz (121.5 kg)  02/04/17 272 lb 9.6 oz (123.7 kg)  12/06/16 271 lb 12.8 oz (123.3 kg)      Other studies Reviewed: Additional studies/ records that were reviewed today include: Cardiac monitor 12/14/17 Sinus rhythm and sinus tachycardia All symptoms associated with sinus rhythm and sinus tachycardia No arrhythmias seen Zero atrial fibrillation   ASSESSMENT AND PLAN:  1.  Palpitations: Currently feeling well on increased dose of carvedilol. We'll continue that medication, as it seems to have improved her palpitations. I have told her that I saw nothing on the monitor that puts her at risk of heart attack, stroke, or asystole.  2. Hypertension: Much better controlled today after starting the carvedilol. We'll continue her current regimen.  3. Snoring: Planned sleep study previously, but she was too busy and was unable to schedule. She Milaina Sher schedule for this coming March.  Current medicines are reviewed at length with the patient today.   The patient does not have concerns regarding her medicines.  The following changes were made today:  none  Labs/ tests ordered today include:  No orders of the defined types were placed in this encounter.    Disposition:   FU with Atziri Zubiate 1 year  Signed, Erin Bradford Meredith Leeds, MD  02/15/2017 11:32 AM     [facility] [address] [place] [address] [phone] (office) [phone] (fax).inic

## 2017-02-15 NOTE — Patient Instructions (Signed)
Medication Instructions:  Your physician recommends that you continue on your current medications as directed. Please refer to the Current Medication list given to you today.   Labwork: None Ordered   Testing/Procedures: None Ordered   Follow-Up: Your physician wants you to follow-up in: 1 year with Dr. Camnitz. You will receive a reminder letter in the mail two months in advance. If you don't receive a letter, please call our office to schedule the follow-up appointment.    Any Other Special Instructions Will Be Listed Below (If Applicable).     If you need a refill on your cardiac medications before your next appointment, please call your pharmacy.   

## 2017-02-16 ENCOUNTER — Other Ambulatory Visit: Payer: Self-pay | Admitting: Family Medicine

## 2017-02-16 DIAGNOSIS — Z1231 Encounter for screening mammogram for malignant neoplasm of breast: Secondary | ICD-10-CM

## 2017-02-17 ENCOUNTER — Ambulatory Visit: Payer: Medicaid Other

## 2017-02-18 ENCOUNTER — Encounter: Payer: Self-pay | Admitting: Neurology

## 2017-02-18 ENCOUNTER — Ambulatory Visit (HOSPITAL_BASED_OUTPATIENT_CLINIC_OR_DEPARTMENT_OTHER): Payer: Medicaid Other

## 2017-02-18 ENCOUNTER — Other Ambulatory Visit (INDEPENDENT_AMBULATORY_CARE_PROVIDER_SITE_OTHER): Payer: Medicaid Other

## 2017-02-18 ENCOUNTER — Ambulatory Visit (INDEPENDENT_AMBULATORY_CARE_PROVIDER_SITE_OTHER): Payer: Medicaid Other | Admitting: Neurology

## 2017-02-18 VITALS — BP 154/82 | HR 96 | Ht 66.5 in | Wt 264.4 lb

## 2017-02-18 VITALS — BP 138/69 | HR 81 | Temp 99.2°F | Resp 18

## 2017-02-18 DIAGNOSIS — I10 Essential (primary) hypertension: Secondary | ICD-10-CM | POA: Diagnosis not present

## 2017-02-18 DIAGNOSIS — G250 Essential tremor: Secondary | ICD-10-CM

## 2017-02-18 DIAGNOSIS — R42 Dizziness and giddiness: Secondary | ICD-10-CM

## 2017-02-18 DIAGNOSIS — N920 Excessive and frequent menstruation with regular cycle: Secondary | ICD-10-CM | POA: Diagnosis not present

## 2017-02-18 DIAGNOSIS — D500 Iron deficiency anemia secondary to blood loss (chronic): Secondary | ICD-10-CM | POA: Diagnosis not present

## 2017-02-18 LAB — TSH: TSH: 0.92 u[IU]/mL (ref 0.35–4.50)

## 2017-02-18 MED ORDER — SODIUM CHLORIDE 0.9 % IV SOLN
Freq: Once | INTRAVENOUS | Status: AC
  Administered 2017-02-18: 16:00:00 via INTRAVENOUS

## 2017-02-18 MED ORDER — SODIUM CHLORIDE 0.9 % IV SOLN
510.0000 mg | Freq: Once | INTRAVENOUS | Status: AC
  Administered 2017-02-18: 510 mg via INTRAVENOUS
  Filled 2017-02-18: qty 17

## 2017-02-18 NOTE — Patient Instructions (Signed)
1.  I don't think the dizziness is neurologic.  I would discuss with your PCP or nephrologist.  It may be due to blood pressure. 2.  I think the tremor is what is called "essential tremor".  First, we will check thyroid hormone, because overactive thyroid causes tremor.  If it is okay, we will start primidone, a medication used to treat tremor.  I will contact you on Monday with those results. 3.  Follow up in 3 months.

## 2017-02-18 NOTE — Patient Instructions (Signed)
Ferumoxytol injection What is this medicine? FERUMOXYTOL is an iron complex. Iron is used to make healthy red blood cells, which carry oxygen and nutrients throughout the body. This medicine is used to treat iron deficiency anemia in people with chronic kidney disease. COMMON BRAND NAME(S): Feraheme What should I tell my health care provider before I take this medicine? They need to know if you have any of these conditions: -anemia not caused by low iron levels -high levels of iron in the blood -magnetic resonance imaging (MRI) test scheduled -an unusual or allergic reaction to iron, other medicines, foods, dyes, or preservatives -pregnant or trying to get pregnant -breast-feeding How should I use this medicine? This medicine is for injection into a vein. It is given by a health care professional in a hospital or clinic setting. Talk to your pediatrician regarding the use of this medicine in children. Special care may be needed. What if I miss a dose? It is important not to miss your dose. Call your doctor or health care professional if you are unable to keep an appointment. What may interact with this medicine? This medicine may interact with the following medications: -other iron products What should I watch for while using this medicine? Visit your doctor or healthcare professional regularly. Tell your doctor or healthcare professional if your symptoms do not start to get better or if they get worse. You may need blood work done while you are taking this medicine. You may need to follow a special diet. Talk to your doctor. Foods that contain iron include: whole grains/cereals, dried fruits, beans, or peas, leafy green vegetables, and organ meats (liver, kidney). What side effects may I notice from receiving this medicine? Side effects that you should report to your doctor or health care professional as soon as possible: -allergic reactions like skin rash, itching or hives, swelling of the  face, lips, or tongue -breathing problems -changes in blood pressure -feeling faint or lightheaded, falls -fever or chills -flushing, sweating, or hot feelings -swelling of the ankles or feet Side effects that usually do not require medical attention (report to your doctor or health care professional if they continue or are bothersome): -diarrhea -headache -nausea, vomiting -stomach pain Where should I keep my medicine? This drug is given in a hospital or clinic and will not be stored at home.  2017 Elsevier/Gold Standard (2016-01-15 12:41:49)  

## 2017-02-18 NOTE — Progress Notes (Signed)
NEUROLOGY CONSULTATION NOTE  Erin Bradford MRN: [id number] DOB: 06-18-1971  Referring provider: Dr. Corinna Capra (prior PCP) Primary care provider: Dr. Lavone Neri (current PCP)  Reason for consult:  Dizziness, tremor  HISTORY OF PRESENT ILLNESS: Erin Bradford is a 46 year old right-handed female with hypertension, mild chronic kidney disease, palpitations, anxiety who presents for dizziness and tremor.  History supplemented by cardiology note.  Dizziness: Starting 7 months ago, she began experiencing dizzy spells.  It only occurs when she stands from a sitting position.  She describes the dizziness as a lightheadedness, like she is going to pass out.  It is accompanied by a sensation of rushing in the head, hearing her heartbeat in her head, bad taste in her mouth, tunnel vision, diaphoresis and palpitations.  It lasts only a minute when she sits back down.  It occurs daily and happens most times when she stands up.  She has taken precautions by taking her time and standing up slowly.  She also has palpitations outside of these events, lasting 30 seconds a few times a week.  She had an EKG which showed sinus rhythm with inferior T-wave inversions with a rate of 58.  She followed up with electrophysiology. She had a 14 day cardiac event monitor which revealed that her palpitations were associated with sinus rhythm and not arrhythmia.  Increasing carvedilol helped with the palpitations. She also has uncontrolled blood pressure and is working with her nephrologist about optimizing control.  Tremor: Starting 8 months ago, she reports tremor in both hands.  She only notices it when she is performing a task, but not at rest.  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  She also reports difficulty pouring liquids in a spoon or using utensils.  It does not affect writing.  She denies any triggers or anything that improves symptoms.  She thinks it has gotten a little worse over the past  year.  She denies starting any new medication prior to onset of symptoms.  She denies family history of tremor.  02/04/17 CMP: Na 138, K 3.8, Cl 105, glucose 85, BUN 8.9, Cr 1.0, EGFR 80, total bili 90, ALP 90, AST 17 and ALT 20; CBC with WBC 8.6, HGB 11, HCT 33.9, PLT 433, MCV 87.1; ferritin 31, iron 31, TIBC 371, UIBC 340, %SAT 8.  PAST MEDICAL HISTORY: Past Medical History:  Diagnosis Date  . Abnormal laboratory test   . Anemia   . Anxiety   . BMI 39.0-39.9,adult   . Chronic hypokalemia   . Hypertension   . Insomnia   . Low back pain   . Obesity   . Palpitations     PAST SURGICAL HISTORY: Past Surgical History:  Procedure Laterality Date  . CESAREAN SECTION    . TOOTH EXTRACTION      MEDICATIONS: Current Outpatient Prescriptions on File Prior to Visit  Medication Sig Dispense Refill  . buPROPion (WELLBUTRIN XL) 150 MG 24 hr tablet TAKE TWO TABLETS BY MOUTH  DAILY    . carvedilol (COREG) 12.5 MG tablet Take 1 tablet (12.5 mg total) by mouth 2 (two) times daily. 60 tablet 3  . cetirizine (ZYRTEC) 10 MG tablet Take 10 mg by mouth daily.    Marland Kitchen FeFum-FePoly-FA-B Cmp-C-Biot (INTEGRA PLUS PO) Take 1 tablet by mouth daily.    Marland Kitchen FERROUS FUMARATE ER PO Take 50 mg by mouth daily.    . furosemide (LASIX) 20 MG tablet Take 20 mg by mouth daily.    Marland Kitchen  losartan (COZAAR) 100 MG tablet Take 100 mg by mouth daily.    . methocarbamol (ROBAXIN) 750 MG tablet Take 750 mg by mouth.    . potassium chloride SA (K-DUR,KLOR-CON) 20 MEQ tablet Take 20 mEq by mouth daily.    . sertraline (ZOLOFT) 50 MG tablet Take 1 tablet by mouth daily.    Marland Kitchen spironolactone (ALDACTONE) 12.5 mg TABS tablet Take 12.5 mg by mouth 2 (two) times daily.    . traMADol (ULTRAM) 50 MG tablet Take 50 mg by mouth 2 (two) times daily.    Marland Kitchen zolpidem (AMBIEN) 10 MG tablet Take 10 mg by mouth at bedtime.   1   No current facility-administered medications on file prior to visit.     ALLERGIES: Allergies  Allergen Reactions  .  Ace Inhibitors Anaphylaxis and Swelling    Swelling of tongue.    FAMILY HISTORY: Family History  Problem Relation Age of Onset  . Diabetes Mother   . Hypertension Mother   . Hyperlipidemia Mother   . Sarcoidosis Mother   . Kidney disease Father   . Atrial fibrillation Sister   . Kidney disease Maternal Grandmother   . Pancreatic cancer Maternal Grandmother   . Diabetes Maternal Grandmother   . Hypertension Maternal Grandmother   . Cancer Maternal Grandfather     SOCIAL HISTORY: Social History   Social History  . Marital status: Married    Spouse name: N/A  . Number of children: N/A  . Years of education: N/A   Occupational History  . Not on file.   Social History Main Topics  . Smoking status: Never Smoker  . Smokeless tobacco: Never Used  . Alcohol use No  . Drug use: No  . Sexual activity: No   Other Topics Concern  . Not on file   Social History Narrative  . No narrative on file    REVIEW OF SYSTEMS: Constitutional: No fevers, chills, or sweats, no generalized fatigue, change in appetite Eyes: No visual changes, double vision, eye pain Ear, nose and throat: No hearing loss, ear pain, nasal congestion, sore throat Cardiovascular: No chest pain, palpitations Respiratory:  No shortness of breath at rest or with exertion, wheezes GastrointestinaI: No nausea, vomiting, diarrhea, abdominal pain, fecal incontinence Genitourinary:  No dysuria, urinary retention or frequency Musculoskeletal:  No neck pain, back pain Integumentary: No rash, pruritus, skin lesions Neurological: as above Psychiatric: No depression, insomnia, anxiety Endocrine: No palpitations, fatigue, diaphoresis, mood swings, change in appetite, change in weight, increased thirst Hematologic/Lymphatic:  No purpura, petechiae. Allergic/Immunologic: no itchy/runny eyes, nasal congestion, recent allergic reactions, rashes  PHYSICAL EXAM: Vitals:   02/18/17 1246  BP: (!) 154/82  Pulse: 96    Orthostatic VS for the past 24 hrs:  BP- Lying Pulse- Lying BP- Sitting Pulse- Sitting BP- Standing at 0 minutes Pulse- Standing at 0 minutes  02/18/17 1304 126/78 84 (!) 146/96 85 (!) 142/92 99   General: No acute distress.  Patient appears well-groomed.  Head:  Normocephalic/atraumatic Eyes:  fundi examined but not visualized Neck: supple, no paraspinal tenderness, full range of motion Back: No paraspinal tenderness Heart: regular rate and rhythm Lungs: Clear to auscultation bilaterally. Vascular: No carotid bruits. Neurological Exam: Mental status: alert and oriented to person, place, and time, recent and remote memory intact, fund of knowledge intact, attention and concentration intact, speech fluent and not dysarthric, language intact. Cranial nerves: CN I: not tested CN II: pupils equal, round and reactive to light, visual fields intact CN III,  IV, VI:  full range of motion, no nystagmus, no ptosis CN V: facial sensation intact CN VII: upper and lower face symmetric CN VIII: hearing intact CN IX, X: gag intact, uvula midline CN XI: sternocleidomastoid and trapezius muscles intact CN XII: tongue midline Bulk & Tone: normal, no fasciculations. Motor:  5/5 throughout.  Bilateral intention tremor  Sensation: temperature and vibration sensation intact. Deep Tendon Reflexes:  2+ throughout, toes downgoing.  Finger to nose testing:  Bilateral intention tremor.  Without dysmetria.  Heel to shin:  Without dysmetria.  Gait:  Normal station and stride.  Able to turn and tandem walk. Romberg with sway.  IMPRESSION: 1.  Dizziness.  Symptoms consistent with near-syncope.  Orthostatic vitals reproduced symptoms when she sat up from supine position.  Curiously, her blood pressure had increased from 126/78 to 146/96.  This does not sound like a neurologic issue and should be further evaluated by her PCP or nephrologist (regarding her blood pressure) 2.  Essential tremor 3.   Hypertension 4.  Morbid obesity (BMI 42.04)  PLAN: 1.  I defer further workup of dizziness/near-syncope to her PCP. 2.  To evaluate tremor, we will check TSH for hyperthyroidism.  If normal, we will start primidone 3.  Follow up with nephrologist regarding elevated blood pressure. 4.  Weight loss 5.  Follow up in 3 months.  Thank you for allowing me to take part in the care of this patient.  Metta Clines, DO  CC:  Helane Rima, MD

## 2017-02-21 ENCOUNTER — Telehealth: Payer: Self-pay | Admitting: Neurology

## 2017-02-21 MED ORDER — PRIMIDONE 50 MG PO TABS: 25.0000 mg | ORAL_TABLET | Freq: Every day | ORAL | 1 refills | Status: DC

## 2017-02-21 NOTE — Telephone Encounter (Signed)
-----   Message from Pieter Partridge, DO sent at 02/21/2017  6:54 AM EST ----- Thyroid looks okay.  For tremor, we will start primidone 25mg  at bedtime.

## 2017-02-21 NOTE — Telephone Encounter (Signed)
Mychart message sent to patient.

## 2017-02-21 NOTE — Addendum Note (Signed)
Addended byAnnamaria Helling on: 02/21/2017 08:56 AM   Modules accepted: Orders

## 2017-02-22 ENCOUNTER — Telehealth: Payer: Self-pay | Admitting: Cardiology

## 2017-02-22 MED ORDER — CARVEDILOL 25 MG PO TABS: 25.0000 mg | ORAL_TABLET | Freq: Two times a day (BID) | ORAL | 11 refills | Status: DC

## 2017-02-22 NOTE — Telephone Encounter (Signed)
New Message    Per pt needing to talk with nurse. States it's very important regarding her last visit, and test results. Requesting  Call back.

## 2017-02-22 NOTE — Telephone Encounter (Signed)
Patient calls in confused about her last appointment.  She has " reviewed my heart monitor results, which showed tachycardia, but the doctor told me that I didn't have tachycardia. She states that he didn't really examine me and was abrupt when in the room with me. Continues explaining that her heart is still racing/beating fast.  Last episode was the day before yesterday and lasted about 2 minutes.  She said that this was the longest episode she has had -- she felt "zapped afterwards".  Apologized to pt that she felt "slighted" from her last OV. Apologized on behalf of Dr. Curt Bears also.  Discussed what was found on the monitor.  She reports BPs avg 150s.  Advised pt to increase Carvedilol to 25 mg BID for HR & BP control, per Camnitz.  Pt will call back if this does not help with her episodes. She thanks me for taking time to talk with her.

## 2017-03-06 ENCOUNTER — Ambulatory Visit (HOSPITAL_BASED_OUTPATIENT_CLINIC_OR_DEPARTMENT_OTHER): Payer: Medicaid Other | Attending: Cardiology

## 2017-03-08 ENCOUNTER — Encounter: Payer: Self-pay | Admitting: Cardiology

## 2017-03-09 MED ORDER — CARVEDILOL 12.5 MG PO TABS: 18.7500 mg | ORAL_TABLET | Freq: Two times a day (BID) | ORAL | 0 refills | Status: DC

## 2017-03-09 NOTE — Telephone Encounter (Signed)
Advised patient to decrease to 18.75 mg BID to see if improvement in diarrhea symptoms.  Pt is willing to try and will contact office if this does not solve GI symptoms.

## 2017-03-15 DIAGNOSIS — F1320 Sedative, hypnotic or anxiolytic dependence, uncomplicated: Secondary | ICD-10-CM | POA: Insufficient documentation

## 2017-03-17 ENCOUNTER — Telehealth: Payer: Self-pay | Admitting: Neurology

## 2017-03-17 NOTE — Telephone Encounter (Signed)
Pt went to get Primidone 50mg  refilled. Pharm said she was requesting refill to early. Pt then realized she was taken 1 tab qhs instead of 1/2 tab qhs since 02/26. Pt states she has had only slight improvement while taking Primidone. Requesting advice on maybe increasing dosage.

## 2017-03-17 NOTE — Telephone Encounter (Signed)
Pt need to talk to nurse about directions on her primidone.

## 2017-03-17 NOTE — Telephone Encounter (Signed)
OK to increase primidone 75mg  at bedtime.  Erin K. Posey Pronto, DO

## 2017-03-18 MED ORDER — PRIMIDONE 50 MG PO TABS: 75.0000 mg | ORAL_TABLET | Freq: Every day | ORAL | 1 refills | Status: DC

## 2017-03-18 NOTE — Telephone Encounter (Signed)
Spoke to pt. Gave instructions per Dr. Serita Grit previous note. Pt verbalized understanding.

## 2017-04-01 ENCOUNTER — Ambulatory Visit (INDEPENDENT_AMBULATORY_CARE_PROVIDER_SITE_OTHER): Payer: Medicaid Other

## 2017-04-01 ENCOUNTER — Ambulatory Visit (INDEPENDENT_AMBULATORY_CARE_PROVIDER_SITE_OTHER): Payer: Medicaid Other | Admitting: Podiatry

## 2017-04-01 ENCOUNTER — Encounter: Payer: Self-pay | Admitting: Podiatry

## 2017-04-01 VITALS — Resp 16 | Ht 66.5 in | Wt 269.0 lb

## 2017-04-01 DIAGNOSIS — M7750 Other enthesopathy of unspecified foot: Secondary | ICD-10-CM | POA: Diagnosis not present

## 2017-04-01 DIAGNOSIS — M779 Enthesopathy, unspecified: Secondary | ICD-10-CM

## 2017-04-01 DIAGNOSIS — M2142 Flat foot [pes planus] (acquired), left foot: Secondary | ICD-10-CM

## 2017-04-01 DIAGNOSIS — M76829 Posterior tibial tendinitis, unspecified leg: Secondary | ICD-10-CM

## 2017-04-01 DIAGNOSIS — M2141 Flat foot [pes planus] (acquired), right foot: Secondary | ICD-10-CM

## 2017-04-01 MED ORDER — METHYLPREDNISOLONE 4 MG PO TBPK: ORAL_TABLET | ORAL | 0 refills | Status: DC

## 2017-04-02 NOTE — Progress Notes (Signed)
Subjective:     Patient ID: Erin Bradford, female   DOB: 11-Aug-1971, 46 y.o.   MRN: [id number]  HPI 46 year old female presents the office today for concerns of left foot pain which has been ongoing for about 1 year. She also associates some pain in the right side intermittently which is not especially the right side today. She denies any recent injury or trauma. She denies any swelling or redness. The pain does not wake are benign. No numbness or tingling. Pain is worse walking. No other complaints at this time.  Review of Systems  All other systems reviewed and are negative.      Objective:   Physical Exam General: AAO x3, NAD  Dermatological: Skin is warm, dry and supple bilateral. Nails x 10 are well manicured; remaining integument appears unremarkable at this time. There are no open sores, no preulcerative lesions, no rash or signs of infection present.  Vascular: Dorsalis Pedis artery and Posterior Tibial artery pedal pulses are 2/4 bilateral with immedate capillary fill time. There is no pain with calf compression, swelling, warmth, erythema.   Neruologic: Grossly intact via light touch bilateral. Vibratory intact via tuning fork bilateral. Protective threshold with Semmes Wienstein monofilament intact to all pedal sites bilateral. Negative Tinel sign.  Musculoskeletal: There is significant decrease in medial arch height upon weightbearing. There is tenderness the dorsal medial aspect of the left foot along the talonavicular as well as navicular cuneiforms joint dorsally. There is tenderness on the insertion of the tibialis anterior. The tibialis anterior appears to be intact. There is trace edema to this area. There is also some mild discomfort along the course of posterior tibial tendon just posterior and inferior to the medial malleolus along the insertion the navicular tuberosity. There is no pain of the right foot today. Muscular strength 5/5 in all groups tested bilateral.  Equinus is present.  Gait: Unassisted, Nonantalgic.      Assessment:     46 year old female with left foot tendinitis, symptomatic flatfoot    Plan:     -Treatment options discussed including all alternatives, risks, and complications -Etiology of symptoms were discussed -X-rays were obtained and reviewed with the patient. No evidence of acute fracture. Significant flatfoot is present. -Tri-Lock ankle brace was dispensed today. -Medrol Dosepak prescribed. -Long-term she is in the knees and orthotic. At this point given her foot type with custom and she wishes to go ahead and proceed with this. She was measured for the inserts today and there were sent to [facility] labs. -Ice and elevation and limited activity -Follow-up in 3 weeks or sooner if needed. Call any questions concerns meantime.  Celesta Gentile, DPM

## 2017-04-04 ENCOUNTER — Other Ambulatory Visit: Payer: Self-pay | Admitting: Cardiology

## 2017-04-05 NOTE — Telephone Encounter (Signed)
ERROR

## 2017-04-29 ENCOUNTER — Ambulatory Visit: Payer: Medicaid Other | Admitting: Podiatry

## 2017-05-02 ENCOUNTER — Ambulatory Visit: Payer: Medicaid Other | Admitting: Podiatry

## 2017-05-06 ENCOUNTER — Telehealth: Payer: Self-pay | Admitting: Oncology

## 2017-05-06 ENCOUNTER — Ambulatory Visit (HOSPITAL_BASED_OUTPATIENT_CLINIC_OR_DEPARTMENT_OTHER): Payer: Medicaid Other | Admitting: Oncology

## 2017-05-06 ENCOUNTER — Other Ambulatory Visit (HOSPITAL_BASED_OUTPATIENT_CLINIC_OR_DEPARTMENT_OTHER): Payer: Medicaid Other

## 2017-05-06 VITALS — BP 149/89 | HR 67 | Temp 98.9°F | Resp 21 | Ht 66.0 in | Wt 266.3 lb

## 2017-05-06 DIAGNOSIS — D500 Iron deficiency anemia secondary to blood loss (chronic): Secondary | ICD-10-CM | POA: Diagnosis present

## 2017-05-06 DIAGNOSIS — N920 Excessive and frequent menstruation with regular cycle: Secondary | ICD-10-CM | POA: Diagnosis not present

## 2017-05-06 LAB — CBC WITH DIFFERENTIAL/PLATELET
BASO%: 0.4 % (ref 0.0–2.0)
Basophils Absolute: 0 10*3/uL (ref 0.0–0.1)
EOS ABS: 0.3 10*3/uL (ref 0.0–0.5)
EOS%: 3.6 % (ref 0.0–7.0)
HCT: 36.8 % (ref 34.8–46.6)
HEMOGLOBIN: 12 g/dL (ref 11.6–15.9)
LYMPH%: 34.3 % (ref 14.0–49.7)
MCH: 29 pg (ref 25.1–34.0)
MCHC: 32.6 g/dL (ref 31.5–36.0)
MCV: 88.9 fL (ref 79.5–101.0)
MONO#: 0.6 10*3/uL (ref 0.1–0.9)
MONO%: 8.8 % (ref 0.0–14.0)
NEUT%: 52.9 % (ref 38.4–76.8)
NEUTROS ABS: 3.8 10*3/uL (ref 1.5–6.5)
Platelets: 439 10*3/uL — ABNORMAL HIGH (ref 145–400)
RBC: 4.14 10*6/uL (ref 3.70–5.45)
RDW: 14 % (ref 11.2–14.5)
WBC: 7.2 10*3/uL (ref 3.9–10.3)
lymph#: 2.5 10*3/uL (ref 0.9–3.3)

## 2017-05-06 LAB — FERRITIN: FERRITIN: 34 ng/mL (ref 9–269)

## 2017-05-06 LAB — IRON AND TIBC
%SAT: 14 % — ABNORMAL LOW (ref 21–57)
IRON: 51 ug/dL (ref 41–142)
TIBC: 358 ug/dL (ref 236–444)
UIBC: 306 ug/dL (ref 120–384)

## 2017-05-06 NOTE — Progress Notes (Signed)
Hematology and Oncology Follow Up Visit  Erin Bradford [id number] Jan 15, 1971 46 y.o. 05/06/2017 1:35 PM Helane Rima, MDNo ref. provider found   Principle Diagnosis: 46 year old with iron deficiency anemia related to chronic blood losses and menorrhagia diagnosed in February 2018. She presented with hemoglobin 11, iron of 31 and saturation of 8%.   Prior Therapy: She status post Feraheme infusion for a total of 1000 mg completed in February 2018.  Current therapy: Oral iron replacement for maintenance purposes.  Interim History: Erin Bradford presents today for a follow-up visit. Since her last visit, she received intravenous iron and tolerated it well. She denied any infusion-related complications, arthralgias, myalgias or GI symptoms. She reported significant improvement in her symptoms of fatigue and tiredness. She remains active and attends to activities of daily living. She is reporting slight increased fatigue as of late but no hematochezia, melena or epistaxis. She continues to have menorrhagia.  She does not report any blurry vision, syncope or seizures. She does not report any fevers, chills, sweats or weight loss. She does not report any chest pain, palpitation, orthopnea or leg edema. She does not report any cough, wheezing or hemoptysis. She does not report any nausea, vomiting or abdominal pain. She does not report any frequency urgency or hesitancy. She does not report any skeletal complaints. Remaining review of systems unremarkable.   Medications: I have reviewed the patient's current medications.  Current Outpatient Prescriptions  Medication Sig Dispense Refill  . buPROPion (WELLBUTRIN XL) 150 MG 24 hr tablet TAKE TWO TABLETS BY MOUTH  DAILY    . carvedilol (COREG) 12.5 MG tablet TAKE 1 & 1/2 (ONE & ONE-HALF) TABLETS BY MOUTH TWICE DAILY 90 tablet 0  . cetirizine (ZYRTEC) 10 MG tablet Take 10 mg by mouth daily.    Marland Kitchen FeFum-FePoly-FA-B Cmp-C-Biot (INTEGRA PLUS PO) Take 1  tablet by mouth daily.    Marland Kitchen FERROUS FUMARATE ER PO Take 50 mg by mouth daily.    . furosemide (LASIX) 20 MG tablet Take 20 mg by mouth daily.    Marland Kitchen losartan (COZAAR) 100 MG tablet Take 100 mg by mouth daily.    . methocarbamol (ROBAXIN) 750 MG tablet Take 750 mg by mouth.    . methylPREDNISolone (MEDROL DOSEPAK) 4 MG TBPK tablet Take as directed 21 tablet 0  . potassium chloride SA (K-DUR,KLOR-CON) 20 MEQ tablet Take 20 mEq by mouth daily.    . primidone (MYSOLINE) 50 MG tablet Take 1.5 tablets (75 mg total) by mouth at bedtime. 45 tablet 1  . sertraline (ZOLOFT) 50 MG tablet Take 1 tablet by mouth daily.    Marland Kitchen spironolactone (ALDACTONE) 12.5 mg TABS tablet Take 12.5 mg by mouth 2 (two) times daily.    . traMADol (ULTRAM) 50 MG tablet Take 50 mg by mouth 2 (two) times daily.    Marland Kitchen zolpidem (AMBIEN) 10 MG tablet Take 10 mg by mouth at bedtime.   1   No current facility-administered medications for this visit.      Allergies:  Allergies  Allergen Reactions  . Ace Inhibitors Anaphylaxis and Swelling    Swelling of tongue.    Past Medical History, Surgical history, Social history, and Family History were reviewed and updated.   Physical Exam: Blood pressure (!) 149/89, pulse 67, temperature 98.9 F (37.2 C), temperature source Oral, resp. rate (!) 21, height 5\' 6"  (1.676 m), weight 266 lb 4.8 oz (120.8 kg), SpO2 100 %. ECOG:0  General appearance: alert and cooperative Head: Normocephalic, without obvious abnormality  Neck: no adenopathy Lymph nodes: Cervical, supraclavicular, and axillary nodes normal. Heart:regular rate and rhythm, S1, S2 normal, no murmur, click, rub or gallop Lung:chest clear, no wheezing, rales, normal symmetric air entry. Abdomin: soft, non-tender, without masses or organomegaly EXT:no erythema, induration, or nodules   Lab Results: Lab Results  Component Value Date   WBC 7.2 05/06/2017   HGB 12.0 05/06/2017   HCT 36.8 05/06/2017   MCV 88.9 05/06/2017    PLT 439 (H) 05/06/2017     Chemistry      Component Value Date/Time   NA 138 02/04/2017 1438   K 3.8 02/04/2017 1438   CO2 26 02/04/2017 1438   BUN 8.9 02/04/2017 1438   CREATININE 1.0 02/04/2017 1438      Component Value Date/Time   CALCIUM 9.2 02/04/2017 1438   ALKPHOS 90 02/04/2017 1438   AST 17 02/04/2017 1438   ALT 20 02/04/2017 1438   BILITOT <0.22 02/04/2017 1438          Impression and Plan:  46 year old woman with the following issues:  1. Iron deficiency anemia: Her diagnosis has been chronic in nature and related to menstrual blood losses. She has menorrhagia related to uterine fibroids and have been on oral iron replacement but has been unsuccessful. Her iron level in February 2018 was 31 with saturation of 8%. Her ferritin was 31.  She received IV iron in February 2018 and tolerated it well. Her hemoglobin is normal at this time and iron studies are pending. Implants continue with periodic checking of her iron and replace as needed. I recommended continuing oral iron for the time being for maintenance purposes. She understands along she has chronic blood losses she will continue to require intravenous iron periodically.   2. Menorrhagia: I have urged her to establish care with Ob/gyn to address her chronic menstrual blood losses.   3. Follow-up: Will be in 3 months to repeat iron studies.   Zola Button, MD 5/11/20181:35 PM

## 2017-05-06 NOTE — Telephone Encounter (Signed)
Gave patient AVS and calender per 5/11 los. Lab and f/u in 3 months.  

## 2017-05-08 ENCOUNTER — Other Ambulatory Visit: Payer: Self-pay | Admitting: Cardiology

## 2017-05-09 ENCOUNTER — Encounter: Payer: Self-pay | Admitting: Cardiology

## 2017-05-09 ENCOUNTER — Other Ambulatory Visit: Payer: Self-pay | Admitting: Cardiology

## 2017-05-09 MED ORDER — CARVEDILOL 12.5 MG PO TABS: ORAL_TABLET | ORAL | 2 refills | Status: DC

## 2017-05-09 NOTE — Telephone Encounter (Signed)
Pt's medication was refills and sent to pt's pharmacy as requested. Confirmation received.

## 2017-05-19 ENCOUNTER — Other Ambulatory Visit: Payer: Self-pay | Admitting: *Deleted

## 2017-05-19 MED ORDER — PRIMIDONE 50 MG PO TABS: 75.0000 mg | ORAL_TABLET | Freq: Every day | ORAL | 3 refills | Status: DC

## 2017-05-20 ENCOUNTER — Ambulatory Visit: Payer: Medicaid Other | Admitting: Podiatry

## 2017-05-24 ENCOUNTER — Encounter: Payer: Self-pay | Admitting: Oncology

## 2017-05-25 ENCOUNTER — Telehealth: Payer: Self-pay | Admitting: *Deleted

## 2017-05-25 NOTE — Telephone Encounter (Signed)
Her ferritin is normal with normal hemoglobin. Her symptoms are not related to low iron. No iron infusion is needed.

## 2017-05-25 NOTE — Telephone Encounter (Signed)
"  I'm calling to schedule infusion with Dr.Shadad.  I'm nauseated, diarrhea, fatigue, headaches and light headed.  We were waiting for lab results.  Results have come in.  My ferritin level is low so I need appointments to receive more Iron.  Return number [phone]."

## 2017-05-26 NOTE — Telephone Encounter (Signed)
Called patient with this information.  No further needs or questions at this time.

## 2017-05-30 ENCOUNTER — Ambulatory Visit (INDEPENDENT_AMBULATORY_CARE_PROVIDER_SITE_OTHER): Payer: Medicaid Other | Admitting: Neurology

## 2017-05-30 ENCOUNTER — Encounter: Payer: Self-pay | Admitting: Neurology

## 2017-05-30 VITALS — BP 122/80 | HR 82 | Ht 66.5 in | Wt 267.0 lb

## 2017-05-30 DIAGNOSIS — G250 Essential tremor: Secondary | ICD-10-CM

## 2017-05-30 MED ORDER — PRIMIDONE 50 MG PO TABS: 100.0000 mg | ORAL_TABLET | Freq: Every day | ORAL | 5 refills | Status: DC

## 2017-05-30 NOTE — Progress Notes (Signed)
NEUROLOGY FOLLOW UP OFFICE NOTE  Erin Bradford [id number]  HISTORY OF PRESENT ILLNESS: Erin Bradford is a 46 year old right-handed female with hypertension, mild chronic kidney disease, palpitations, anxiety who follows up for tremor.   UPDATE: To evaluate secondary causes of tremor, TSH was checked, which was 0.92.  She was subsequently started on primidone and is currently taking 75mg  at bedtime.  She reports  improvement but it is still noticeable to her.  She is tolerating the primidone.    HISTORY: Beginning in 2017, she reports tremor in both hands.  She only notices it when she is performing a task, but not at rest.  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  She also reports difficulty pouring liquids in a spoon or using utensils.  It does not affect writing.  She denies any triggers or anything that improves symptoms.  She thinks it has gotten a little worse over the past year.  She denies starting any new medication prior to onset of symptoms.  She denies family history of tremor.  PAST MEDICAL HISTORY: Past Medical History:  Diagnosis Date  . Abnormal laboratory test   . Anemia   . Anxiety   . BMI 39.0-39.9,adult   . Chronic hypokalemia   . Hypertension   . Insomnia   . Low back pain   . Obesity   . Palpitations     MEDICATIONS: Current Outpatient Prescriptions on File Prior to Visit  Medication Sig Dispense Refill  . buPROPion (WELLBUTRIN XL) 150 MG 24 hr tablet TAKE TWO TABLETS BY MOUTH  DAILY    . carvedilol (COREG) 12.5 MG tablet TAKE 1 & 1/2 (ONE & ONE-HALF) TABLETS BY MOUTH TWICE DAILY 270 tablet 2  . cetirizine (ZYRTEC) 10 MG tablet Take 10 mg by mouth daily.    Marland Kitchen FeFum-FePoly-FA-B Cmp-C-Biot (INTEGRA PLUS PO) Take 1 tablet by mouth daily.    Marland Kitchen FERROUS FUMARATE ER PO Take 50 mg by mouth daily.    . furosemide (LASIX) 20 MG tablet Take 20 mg by mouth daily.    Marland Kitchen losartan (COZAAR) 100 MG tablet Take 100 mg by mouth daily.    .  methocarbamol (ROBAXIN) 750 MG tablet Take 750 mg by mouth.    . potassium chloride SA (K-DUR,KLOR-CON) 20 MEQ tablet Take 20 mEq by mouth daily.    . sertraline (ZOLOFT) 50 MG tablet Take 1 tablet by mouth daily.    Marland Kitchen spironolactone (ALDACTONE) 12.5 mg TABS tablet Take 12.5 mg by mouth 2 (two) times daily.    . traMADol (ULTRAM) 50 MG tablet Take 50 mg by mouth 2 (two) times daily.    Marland Kitchen zolpidem (AMBIEN) 10 MG tablet Take 10 mg by mouth at bedtime.   1   No current facility-administered medications on file prior to visit.     ALLERGIES: Allergies  Allergen Reactions  . Ace Inhibitors Anaphylaxis and Swelling    Swelling of tongue.    FAMILY HISTORY: Family History  Problem Relation Age of Onset  . Diabetes Mother   . Hypertension Mother   . Hyperlipidemia Mother   . Sarcoidosis Mother   . Kidney disease Father   . Atrial fibrillation Sister   . Kidney disease Maternal Grandmother   . Pancreatic cancer Maternal Grandmother   . Diabetes Maternal Grandmother   . Hypertension Maternal Grandmother   . Cancer Maternal Grandfather     SOCIAL HISTORY: Social History   Social History  . Marital  status: Married    Spouse name: N/A  . Number of children: N/A  . Years of education: N/A   Occupational History  . Not on file.   Social History Main Topics  . Smoking status: Never Smoker  . Smokeless tobacco: Never Used  . Alcohol use No  . Drug use: No  . Sexual activity: No   Other Topics Concern  . Not on file   Social History Narrative  . No narrative on file    REVIEW OF SYSTEMS: Constitutional: No fevers, chills, or sweats, no generalized fatigue, change in appetite Eyes: No visual changes, double vision, eye pain Ear, nose and throat: No hearing loss, ear pain, nasal congestion, sore throat Cardiovascular: No chest pain, palpitations Respiratory:  No shortness of breath at rest or with exertion, wheezes GastrointestinaI: No nausea, vomiting, diarrhea,  abdominal pain, fecal incontinence Genitourinary:  No dysuria, urinary retention or frequency Musculoskeletal:  No neck pain, back pain Integumentary: No rash, pruritus, skin lesions Neurological: as above Psychiatric: No depression, insomnia, anxiety Endocrine: No palpitations, fatigue, diaphoresis, mood swings, change in appetite, change in weight, increased thirst Hematologic/Lymphatic:  No purpura, petechiae. Allergic/Immunologic: no itchy/runny eyes, nasal congestion, recent allergic reactions, rashes  PHYSICAL EXAM: Vitals:   05/30/17 1420  BP: 122/80  Pulse: 82   General: No acute distress.  Patient appears well-groomed.   Head:  Normocephalic/atraumatic Eyes:  Fundi examined but not visualized Neck: supple, no paraspinal tenderness, full range of motion Heart:  Regular rate and rhythm Lungs:  Clear to auscultation bilaterally Back: No paraspinal tenderness Neurological Exam: alert and oriented to person, place, and time. Attention span and concentration intact, recent and remote memory intact, fund of knowledge intact.  Speech fluent and not dysarthric, language intact.  CN II-XII intact. Bulk and tone normal, muscle strength 5/5 throughout.  Sensation to light touch, temperature and vibration intact.  Deep tendon reflexes 2+ throughout, toes downgoing.  Finger to nose and heel to shin testing intact.  There is very minimal/slight intention tremor in both upper extremities.  Able to draw Archimedes swirl and right a sentence without difficulty. Gait normal, Romberg negative.  IMPRESSION: Benign essential tremor.  It is very minimal, if at all. Morbid obesity (BMI 42.45)  PLAN: 1.  She would like to try and achieve even better results.  We will increase primidone to 100mg  at bedtime. 2.  Recommend weight loss 3.  Follow up in 5 months.  16 minutes spent face to face with patient, over 50% spent discussing management.  Metta Clines, DO  CC:  Helane Rima, MD

## 2017-05-30 NOTE — Patient Instructions (Signed)
1.  Increase primidone 50mg  tablet to two tablets at bedtime.   2.  At the end of the month, you may refill the prescription if you are doing well.  If not, contact me prior to picking up refill and I can order a higher dose. 3.  Follow up in 5 months.

## 2017-06-09 ENCOUNTER — Encounter: Payer: Self-pay | Admitting: Oncology

## 2017-07-01 ENCOUNTER — Ambulatory Visit: Payer: Medicaid Other | Admitting: Podiatry

## 2017-07-05 DIAGNOSIS — F419 Anxiety disorder, unspecified: Secondary | ICD-10-CM | POA: Diagnosis not present

## 2017-07-05 DIAGNOSIS — F329 Major depressive disorder, single episode, unspecified: Secondary | ICD-10-CM | POA: Diagnosis not present

## 2017-07-05 DIAGNOSIS — I10 Essential (primary) hypertension: Secondary | ICD-10-CM | POA: Diagnosis not present

## 2017-07-05 DIAGNOSIS — M1990 Unspecified osteoarthritis, unspecified site: Secondary | ICD-10-CM | POA: Diagnosis not present

## 2017-07-15 DIAGNOSIS — Z79891 Long term (current) use of opiate analgesic: Secondary | ICD-10-CM | POA: Diagnosis not present

## 2017-07-15 DIAGNOSIS — M545 Low back pain: Secondary | ICD-10-CM | POA: Diagnosis not present

## 2017-07-15 DIAGNOSIS — M79605 Pain in left leg: Secondary | ICD-10-CM | POA: Diagnosis not present

## 2017-07-15 DIAGNOSIS — G894 Chronic pain syndrome: Secondary | ICD-10-CM | POA: Diagnosis not present

## 2017-07-18 ENCOUNTER — Telehealth: Payer: Self-pay | Admitting: Neurology

## 2017-07-18 NOTE — Telephone Encounter (Signed)
Patient called in to let Dr. Tomi Likens know that the medication Primidone Dosage  was not working for her. Please Advise. Thanks

## 2017-07-19 ENCOUNTER — Other Ambulatory Visit: Payer: Self-pay | Admitting: *Deleted

## 2017-07-19 MED ORDER — PRIMIDONE 50 MG PO TABS: 100.0000 mg | ORAL_TABLET | Freq: Every day | ORAL | 3 refills | Status: DC

## 2017-07-19 MED ORDER — PRIMIDONE 50 MG PO TABS: 100.0000 mg | ORAL_TABLET | Freq: Every day | ORAL | 5 refills | Status: DC

## 2017-07-19 NOTE — Telephone Encounter (Signed)
Patient given instructions and new Rx sent in.

## 2017-07-19 NOTE — Telephone Encounter (Signed)
Please advise 

## 2017-07-19 NOTE — Telephone Encounter (Signed)
She should increase primidone to 150mg  at bedtime.  If not any better, then she may increase to 200mg  at bedtime in one week.

## 2017-07-21 ENCOUNTER — Other Ambulatory Visit: Payer: Self-pay | Admitting: *Deleted

## 2017-07-21 MED ORDER — PRIMIDONE 50 MG PO TABS: 150.0000 mg | ORAL_TABLET | Freq: Every day | ORAL | 3 refills | Status: DC

## 2017-07-29 ENCOUNTER — Telehealth: Payer: Self-pay | Admitting: Oncology

## 2017-07-29 ENCOUNTER — Ambulatory Visit (HOSPITAL_BASED_OUTPATIENT_CLINIC_OR_DEPARTMENT_OTHER): Payer: Medicaid Other | Admitting: Oncology

## 2017-07-29 ENCOUNTER — Other Ambulatory Visit (HOSPITAL_BASED_OUTPATIENT_CLINIC_OR_DEPARTMENT_OTHER): Payer: Medicaid Other

## 2017-07-29 VITALS — BP 142/78 | HR 75 | Temp 99.3°F | Resp 20 | Ht 66.5 in | Wt 268.8 lb

## 2017-07-29 DIAGNOSIS — N920 Excessive and frequent menstruation with regular cycle: Secondary | ICD-10-CM | POA: Diagnosis not present

## 2017-07-29 DIAGNOSIS — D500 Iron deficiency anemia secondary to blood loss (chronic): Secondary | ICD-10-CM

## 2017-07-29 LAB — CBC WITH DIFFERENTIAL/PLATELET
BASO%: 0.8 % (ref 0.0–2.0)
Basophils Absolute: 0.1 10*3/uL (ref 0.0–0.1)
EOS%: 4.1 % (ref 0.0–7.0)
Eosinophils Absolute: 0.3 10*3/uL (ref 0.0–0.5)
HCT: 30.7 % — ABNORMAL LOW (ref 34.8–46.6)
HGB: 9.9 g/dL — ABNORMAL LOW (ref 11.6–15.9)
LYMPH%: 22.6 % (ref 14.0–49.7)
MCH: 26.2 pg (ref 25.1–34.0)
MCHC: 32.2 g/dL (ref 31.5–36.0)
MCV: 81.6 fL (ref 79.5–101.0)
MONO#: 0.6 10*3/uL (ref 0.1–0.9)
MONO%: 9.3 % (ref 0.0–14.0)
NEUT%: 63.2 % (ref 38.4–76.8)
NEUTROS ABS: 4.2 10*3/uL (ref 1.5–6.5)
PLATELETS: 411 10*3/uL — AB (ref 145–400)
RBC: 3.77 10*6/uL (ref 3.70–5.45)
RDW: 14.2 % (ref 11.2–14.5)
WBC: 6.6 10*3/uL (ref 3.9–10.3)
lymph#: 1.5 10*3/uL (ref 0.9–3.3)

## 2017-07-29 LAB — IRON AND TIBC
%SAT: 7 % — ABNORMAL LOW (ref 21–57)
IRON: 28 ug/dL — AB (ref 41–142)
TIBC: 423 ug/dL (ref 236–444)
UIBC: 394 ug/dL — ABNORMAL HIGH (ref 120–384)

## 2017-07-29 LAB — FERRITIN: FERRITIN: 13 ng/mL (ref 9–269)

## 2017-07-29 NOTE — Progress Notes (Signed)
Hematology and Oncology Follow Up Visit  Erin Bradford [id number] Sep 13, 1971 46 y.o. 07/29/2017 11:57 AM Erin Bradford, MDHowell, Bryn Gulling, MD   Principle Diagnosis: 46 year old with iron deficiency anemia related to chronic blood losses and menorrhagia diagnosed in February 2018. She presented with hemoglobin 11, iron of 31 and saturation of 8%.   Prior Therapy: She status post Feraheme infusion for a total of 1000 mg completed in February 2018.  Current therapy: Oral iron replacement for maintenance purposes. She has not been taking it regularly.  Interim History: Erin Bradford presents today for a follow-up visit. Since her last visit, she reports no major changes in her health. She is reporting some increased fatigue, tiredness and ice cravings. She remains active and attends to activities of daily living. She is reporting no hematochezia, melena or epistaxis. She continues to have menorrhagia. She reports that she is having to menstrual cycles a month and heavy flow with each of those. She has established care with OB/GYN and has appointment in the near future. She denied any complications related to Feraheme infusion in the past. She reports she has not been taking oral iron because of lack of effectiveness and tolerance.  She does not report any blurry vision, syncope or seizures. She does not report any fevers, chills, sweats or weight loss. She does not report any chest pain, palpitation, orthopnea or leg edema. She does not report any cough, wheezing or hemoptysis. She does not report any nausea, vomiting or abdominal pain. She does not report any frequency urgency or hesitancy. She does not report any skeletal complaints. Remaining review of systems unremarkable.   Medications: I have reviewed the patient's current medications.  Current Outpatient Prescriptions  Medication Sig Dispense Refill  . buPROPion (WELLBUTRIN XL) 150 MG 24 hr tablet TAKE TWO TABLETS BY MOUTH  DAILY    .  carvedilol (COREG) 12.5 MG tablet TAKE 1 & 1/2 (ONE & ONE-HALF) TABLETS BY MOUTH TWICE DAILY 270 tablet 2  . cetirizine (ZYRTEC) 10 MG tablet Take 10 mg by mouth daily.    Marland Kitchen FeFum-FePoly-FA-B Cmp-C-Biot (INTEGRA PLUS PO) Take 1 tablet by mouth daily.    Marland Kitchen FERROUS FUMARATE ER PO Take 50 mg by mouth daily.    . furosemide (LASIX) 20 MG tablet Take 20 mg by mouth daily.    Marland Kitchen losartan (COZAAR) 100 MG tablet Take 100 mg by mouth daily.    . methocarbamol (ROBAXIN) 750 MG tablet Take 750 mg by mouth.    . potassium chloride SA (K-DUR,KLOR-CON) 20 MEQ tablet Take 20 mEq by mouth daily.    . primidone (MYSOLINE) 50 MG tablet Take 2 tablets (100 mg total) by mouth at bedtime. 60 tablet 5  . primidone (MYSOLINE) 50 MG tablet Take 3 tablets (150 mg total) by mouth at bedtime. 90 tablet 3  . sertraline (ZOLOFT) 50 MG tablet Take 1 tablet by mouth daily.    Marland Kitchen spironolactone (ALDACTONE) 12.5 mg TABS tablet Take 12.5 mg by mouth 2 (two) times daily.    . traMADol (ULTRAM) 50 MG tablet Take 50 mg by mouth 2 (two) times daily.    Marland Kitchen zolpidem (AMBIEN) 10 MG tablet Take 10 mg by mouth at bedtime.   1   No current facility-administered medications for this visit.      Allergies:  Allergies  Allergen Reactions  . Ace Inhibitors Anaphylaxis and Swelling    Swelling of tongue.    Past Medical History, Surgical history, Social history, and Family History were reviewed  and updated.   Physical Exam: Blood pressure (!) 142/78, pulse 75, temperature 99.3 F (37.4 C), temperature source Oral, resp. rate 20, height 5' 6.5" (1.689 m), weight 268 lb 12.8 oz (121.9 kg), SpO2 98 %. ECOG:0  General appearance: alert and cooperative appeared without distress. Head: Normocephalic, without obvious abnormality no oral thrush or ulcers. Neck: no adenopathy Lymph nodes: Cervical, supraclavicular, and axillary nodes normal. Heart:regular rate and rhythm, S1, S2 normal, no murmur, click, rub or gallop Lung:chest clear, no  wheezing, rales, normal symmetric air entry. Abdomin: soft, non-tender, without masses or organomegaly EXT:no erythema, induration, or nodules   Lab Results: Lab Results  Component Value Date   WBC 6.6 07/29/2017   HGB 9.9 (L) 07/29/2017   HCT 30.7 (L) 07/29/2017   MCV 81.6 07/29/2017   PLT 411 (H) 07/29/2017     Chemistry      Component Value Date/Time   NA 138 02/04/2017 1438   K 3.8 02/04/2017 1438   CO2 26 02/04/2017 1438   BUN 8.9 02/04/2017 1438   CREATININE 1.0 02/04/2017 1438      Component Value Date/Time   CALCIUM 9.2 02/04/2017 1438   ALKPHOS 90 02/04/2017 1438   AST 17 02/04/2017 1438   ALT 20 02/04/2017 1438   BILITOT <0.22 02/04/2017 1438          Impression and Plan:  46 year old woman with the following issues:  1. Iron deficiency anemia: Her diagnosis has been chronic in nature and related to menstrual blood losses. She has menorrhagia related to uterine fibroids and have been on oral iron replacement but has been unsuccessful. Her iron level in February 2018 was 31 with saturation of 8%. Her ferritin was 31.  She received IV iron in February 2018 and tolerated it well.   Her hemoglobin is declining again with symptoms of iron deficiency. Risks and benefits of Feraheme infusion were reviewed again and she is agreeable to receive it.   2. Menorrhagia: She will follow-up with her OB/GYN regarding this issue.  3. Colon cancer screening: She does not have any GI symptoms but she is of appropriate age to receive colon cancer screening. I do not think her iron deficiency is related to GI blood losses but it is appropriate to consider cancer screening in her.  4. Follow-up: Will be in 3 months to repeat iron studies.   [facility], MD 8/3/201811:57 AM

## 2017-07-29 NOTE — Telephone Encounter (Signed)
Scheduled appt per 8/3 los - Gave patient AVS and calender per los.  

## 2017-08-05 ENCOUNTER — Ambulatory Visit (HOSPITAL_BASED_OUTPATIENT_CLINIC_OR_DEPARTMENT_OTHER): Payer: Medicaid Other

## 2017-08-05 VITALS — BP 132/62 | HR 70 | Temp 98.5°F | Resp 16

## 2017-08-05 DIAGNOSIS — D500 Iron deficiency anemia secondary to blood loss (chronic): Secondary | ICD-10-CM | POA: Diagnosis not present

## 2017-08-05 DIAGNOSIS — N920 Excessive and frequent menstruation with regular cycle: Secondary | ICD-10-CM

## 2017-08-05 MED ORDER — SODIUM CHLORIDE 0.9 % IV SOLN
510.0000 mg | Freq: Once | INTRAVENOUS | Status: AC
  Administered 2017-08-05: 510 mg via INTRAVENOUS
  Filled 2017-08-05: qty 17

## 2017-08-05 MED ORDER — SODIUM CHLORIDE 0.9 % IV SOLN
Freq: Once | INTRAVENOUS | Status: AC
  Administered 2017-08-05: 10:00:00 via INTRAVENOUS

## 2017-08-05 NOTE — Patient Instructions (Signed)
Ferumoxytol injection What is this medicine? FERUMOXYTOL is an iron complex. Iron is used to make healthy red blood cells, which carry oxygen and nutrients throughout the body. This medicine is used to treat iron deficiency anemia in people with chronic kidney disease. COMMON BRAND NAME(S): Feraheme What should I tell my health care provider before I take this medicine? They need to know if you have any of these conditions: -anemia not caused by low iron levels -high levels of iron in the blood -magnetic resonance imaging (MRI) test scheduled -an unusual or allergic reaction to iron, other medicines, foods, dyes, or preservatives -pregnant or trying to get pregnant -breast-feeding How should I use this medicine? This medicine is for injection into a vein. It is given by a health care professional in a hospital or clinic setting. Talk to your pediatrician regarding the use of this medicine in children. Special care may be needed. What if I miss a dose? It is important not to miss your dose. Call your doctor or health care professional if you are unable to keep an appointment. What may interact with this medicine? This medicine may interact with the following medications: -other iron products What should I watch for while using this medicine? Visit your doctor or healthcare professional regularly. Tell your doctor or healthcare professional if your symptoms do not start to get better or if they get worse. You may need blood work done while you are taking this medicine. You may need to follow a special diet. Talk to your doctor. Foods that contain iron include: whole grains/cereals, dried fruits, beans, or peas, leafy green vegetables, and organ meats (liver, kidney). What side effects may I notice from receiving this medicine? Side effects that you should report to your doctor or health care professional as soon as possible: -allergic reactions like skin rash, itching or hives, swelling of the  face, lips, or tongue -breathing problems -changes in blood pressure -feeling faint or lightheaded, falls -fever or chills -flushing, sweating, or hot feelings -swelling of the ankles or feet Side effects that usually do not require medical attention (report to your doctor or health care professional if they continue or are bothersome): -diarrhea -headache -nausea, vomiting -stomach pain Where should I keep my medicine? This drug is given in a hospital or clinic and will not be stored at home.  2017 Elsevier/Gold Standard (2016-01-15 12:41:49)  

## 2017-08-11 ENCOUNTER — Encounter: Payer: Self-pay | Admitting: Oncology

## 2017-08-12 ENCOUNTER — Ambulatory Visit: Payer: Medicaid Other

## 2017-08-12 ENCOUNTER — Telehealth: Payer: Self-pay | Admitting: Oncology

## 2017-08-12 ENCOUNTER — Telehealth: Payer: Self-pay

## 2017-08-12 NOTE — Telephone Encounter (Signed)
Pt called to r/s today's feraheme appt to next week Monday Tuesday or Friday. In basket sent high priority, infusion informed.

## 2017-08-12 NOTE — Telephone Encounter (Signed)
sw pt to confirm r/s appt to 8/21 at 0915 per sch msg

## 2017-08-15 ENCOUNTER — Encounter: Payer: Self-pay | Admitting: Oncology

## 2017-08-16 ENCOUNTER — Ambulatory Visit: Payer: Medicaid Other

## 2017-08-18 ENCOUNTER — Telehealth: Payer: Self-pay

## 2017-08-18 ENCOUNTER — Telehealth: Payer: Self-pay | Admitting: *Deleted

## 2017-08-18 NOTE — Telephone Encounter (Signed)
Called patient with upcoming appouintment for 9/4.

## 2017-08-18 NOTE — Telephone Encounter (Signed)
Patient called and left a message stating,"I need to reschedule the appointment from 08/16/17. Thank you."

## 2017-08-26 ENCOUNTER — Telehealth: Payer: Self-pay | Admitting: Oncology

## 2017-08-26 NOTE — Telephone Encounter (Signed)
Spoke with patient and rescheduled her appt from Friday to 9/18.

## 2017-08-30 ENCOUNTER — Ambulatory Visit: Payer: Medicaid Other

## 2017-09-02 DIAGNOSIS — D649 Anemia, unspecified: Secondary | ICD-10-CM | POA: Diagnosis not present

## 2017-09-02 DIAGNOSIS — N920 Excessive and frequent menstruation with regular cycle: Secondary | ICD-10-CM | POA: Diagnosis not present

## 2017-09-02 DIAGNOSIS — D259 Leiomyoma of uterus, unspecified: Secondary | ICD-10-CM | POA: Diagnosis not present

## 2017-09-02 DIAGNOSIS — Z01818 Encounter for other preprocedural examination: Secondary | ICD-10-CM | POA: Diagnosis not present

## 2017-09-02 DIAGNOSIS — R000 Tachycardia, unspecified: Secondary | ICD-10-CM | POA: Insufficient documentation

## 2017-09-02 DIAGNOSIS — I10 Essential (primary) hypertension: Secondary | ICD-10-CM | POA: Insufficient documentation

## 2017-09-09 ENCOUNTER — Telehealth: Payer: Self-pay | Admitting: *Deleted

## 2017-09-09 ENCOUNTER — Ambulatory Visit: Payer: Self-pay

## 2017-09-09 NOTE — Telephone Encounter (Signed)
Patient called and left a voice mail message stating,"I need to cancel my appointment for Tuesday. I"ll need to reschedule it."

## 2017-09-13 ENCOUNTER — Ambulatory Visit: Payer: Self-pay

## 2017-09-15 ENCOUNTER — Telehealth: Payer: Self-pay | Admitting: *Deleted

## 2017-09-15 NOTE — Telephone Encounter (Signed)
Patient called and left a voicemail message stating,"I need to reschedule my iron infusion. Return number is [phone]." Sent in-basket message to be rescheduled.

## 2017-09-16 ENCOUNTER — Telehealth: Payer: Self-pay | Admitting: Oncology

## 2017-09-16 NOTE — Telephone Encounter (Signed)
Left voicemail for patient regarding her appts. Added per 9/19 sch msg. Sending her a confirmation letter.

## 2017-09-19 ENCOUNTER — Telehealth: Payer: Self-pay | Admitting: Oncology

## 2017-09-19 NOTE — Telephone Encounter (Signed)
Spoke with patient and rescheduled her appts from 10/1 to 10/4

## 2017-09-20 ENCOUNTER — Encounter: Payer: Self-pay | Admitting: Neurology

## 2017-09-21 ENCOUNTER — Other Ambulatory Visit: Payer: Self-pay

## 2017-09-21 ENCOUNTER — Other Ambulatory Visit: Payer: Self-pay | Admitting: *Deleted

## 2017-09-21 MED ORDER — PRIMIDONE 50 MG PO TABS: 200.0000 mg | ORAL_TABLET | ORAL | 1 refills | Status: DC

## 2017-09-26 ENCOUNTER — Ambulatory Visit: Payer: Self-pay

## 2017-09-27 DIAGNOSIS — J302 Other seasonal allergic rhinitis: Secondary | ICD-10-CM | POA: Insufficient documentation

## 2017-09-27 NOTE — Patient Instructions (Addendum)
Erin Bradford  [birth year]      Your procedure is scheduled on 10-06-17  Report to [place]  at 6:30 A.M.  Call this number if you have problems the morning of surgery:  [facility].   Remember:  Do not eat food or drink liquids after midnight.  Take these medicines the morning of surgery with A SIP OF WATER: wellbutrin, carvedilol(coreg), cetirizine(zyrtec), sertraline(zoloft) , nasal spray if needed, hydrocodone if needed    Do not wear jewelry, make-up or nail polish.  Do not wear lotions, powders, or perfumes, or deoderant.  Do not shave 48 hours prior to surgery.  Men may shave face and neck.  Do not bring valuables to the hospital.  [facility] is not responsible for any belongings or valuables.  Contacts, dentures or bridgework may not be worn into surgery.  Leave your suitcase in the car.  After surgery it may be brought to your room.  For patients admitted to the hospital, discharge time will be determined by your treatment team.   Special instructions:  N/A  Please read over the following fact sheets that you were given:     [facility] - Preparing for Surgery Before surgery, you can play an important role.  Because skin is not sterile, your skin needs to be as free of germs as possible.  You can reduce the number of germs on your skin by washing with CHG (chlorahexidine gluconate) soap before surgery.  CHG is an antiseptic cleaner which kills germs and bonds with the skin to continue killing germs even after washing. Please DO NOT use if you have an allergy to CHG or antibacterial soaps.  If your skin becomes reddened/irritated stop using the CHG and inform your nurse when you arrive at Short Stay. Do not shave (including legs and underarms) for at least 48 hours prior to the first CHG shower.  You may shave your face/neck. Please follow these  instructions carefully:  1.  Shower with CHG Soap the night before surgery and the  morning of Surgery.  2.  If you choose to wash your hair, wash your hair first as usual with your  normal  shampoo.  3.  After you shampoo, rinse your hair and body thoroughly to remove the  shampoo.                           4.  Use CHG as you would any other liquid soap.  You can apply chg directly  to the skin and wash                       Gently with a scrungie or clean washcloth.  5.  Apply the CHG Soap to your body ONLY FROM THE NECK DOWN.   Do not use on face/ open                           Wound or open sores. Avoid contact with eyes, ears mouth and genitals (private parts).                       Wash face,  Genitals (private parts) with your normal soap.             6.  Wash thoroughly, paying special attention to the area where your surgery  will be performed.  7.  Thoroughly rinse your body with warm water from the neck down.  8.  DO NOT shower/wash with your normal soap after using and rinsing off  the CHG Soap.                9.  Pat yourself dry with a clean towel.            10.  Wear clean pajamas.            11.  Place clean sheets on your bed the night of your first shower and do not  sleep with pets. Day of Surgery : Do not apply any lotions/deodorants the morning of surgery.  Please wear clean clothes to the hospital/surgery center.  FAILURE TO FOLLOW THESE INSTRUCTIONS MAY RESULT IN THE CANCELLATION OF YOUR SURGERY PATIENT SIGNATURE_________________________________  NURSE SIGNATURE__________________________________  ________________________________________________________________________   Erin Bradford  An incentive spirometer is a tool that can help keep your lungs clear and active. This tool measures how well you are filling your lungs with each breath. Taking long deep breaths may help reverse or decrease the chance of developing breathing (pulmonary) problems (especially  infection) following:  A long period of time when you are unable to move or be active. BEFORE THE PROCEDURE   If the spirometer includes an indicator to show your best effort, your nurse or respiratory therapist will set it to a desired goal.  If possible, sit up straight or lean slightly forward. Try not to slouch.  Hold the incentive spirometer in an upright position. INSTRUCTIONS FOR USE  1. Sit on the edge of your bed if possible, or sit up as far as you can in bed or on a chair. 2. Hold the incentive spirometer in an upright position. 3. Breathe out normally. 4. Place the mouthpiece in your mouth and seal your lips tightly around it. 5. Breathe in slowly and as deeply as possible, raising the piston or the ball toward the top of the column. 6. Hold your breath for 3-5 seconds or for as long as possible. Allow the piston or ball to fall to the bottom of the column. 7. Remove the mouthpiece from your mouth and breathe out normally. 8. Rest for a few seconds and repeat Steps 1 through 7 at least 10 times every 1-2 hours when you are awake. Take your time and take a few normal breaths between deep breaths. 9. The spirometer may include an indicator to show your best effort. Use the indicator as a goal to work toward during each repetition. 10. After each set of 10 deep breaths, practice coughing to be sure your lungs are clear. If you have an incision (the cut made at the time of surgery), support your incision when coughing by placing a pillow or rolled up towels firmly against it. Once you are able to get out of bed, walk around indoors and cough well. You may stop using the incentive spirometer when instructed by your caregiver.  RISKS AND COMPLICATIONS  Take your time so you do not get dizzy or light-headed.  If you are in pain, you may need to take or ask for pain medication before doing incentive spirometry. It is harder to take a deep breath if you are having pain. AFTER  USE  Rest and breathe slowly and easily.  It can be helpful to keep track of a log of your progress.  Your caregiver can provide you with a simple table to help with this. If you are using the spirometer at home, follow these instructions: [place] IF:   You are having difficultly using the spirometer.  You have trouble using the spirometer as often as instructed.  Your pain medication is not giving enough relief while using the spirometer.  You develop fever of 100.5 F (38.1 C) or higher. SEEK IMMEDIATE MEDICAL CARE IF:   You cough up bloody sputum that had not been present before.  You develop fever of 102 F (38.9 C) or greater.  You develop worsening pain at or near the incision site. MAKE SURE YOU:   Understand these instructions.  Will watch your condition.  Will get help right away if you are not doing well or get worse. Document Released: 04/25/2007 Document Revised: 03/06/2012 Document Reviewed: 06/26/2007 [facility] Patient Information 2014 [place], [place].   ________________________________________________________________________

## 2017-09-27 NOTE — Progress Notes (Signed)
[place] cardiology Dr Curt Bears 02-15-17 epic  EKG 12-06-16 epic

## 2017-09-27 NOTE — H&P (Signed)
Erin Bradford is an 46 y.o. female here for scheduled surgery due to fibroid uterus causing heavy bleeding requiring 3-4 transfusions this past year. Fibroids have increased in number and size over last few years. Pt has been counseled as to options for mgmt and opts for definitive treatment via surgery. She has been consented for total laparoscopic hysterectomy with bilateral salpingo-oopherectomy, possible open, and possible cystoscopy  Pertinent Gynecological History: Menses: flow is excessive with use of 10-15 pads or tampons on heaviest days Bleeding: dysfunctional uterine bleeding Contraception: condoms DES exposure: denies Blood transfusions: 3-4/yr Sexually transmitted diseases: no past history Previous GYN Procedures: none  Last mammogram: n/a Date:  Last pap: normal Date: 03/2015 OB History: G3, P3003   Menstrual History: Menarche age: 42 No LMP recorded.    Past Medical History:  Diagnosis Date  . Abnormal laboratory test   . Anemia   . Anxiety   . BMI 39.0-39.9,adult   . Chronic hypokalemia   . Hypertension   . Insomnia   . Low back pain   . Obesity   . Palpitations     Past Surgical History:  Procedure Laterality Date  . CESAREAN SECTION    . TOOTH EXTRACTION      Family History  Problem Relation Age of Onset  . Diabetes Mother   . Hypertension Mother   . Hyperlipidemia Mother   . Sarcoidosis Mother   . Kidney disease Father   . Atrial fibrillation Sister   . Kidney disease Maternal Grandmother   . Pancreatic cancer Maternal Grandmother   . Diabetes Maternal Grandmother   . Hypertension Maternal Grandmother   . Cancer Maternal Grandfather     Social History:  reports that she has never smoked. She has never used smokeless tobacco. She reports that she does not drink alcohol or use drugs.  Allergies:  Allergies  Allergen Reactions  . Ace Inhibitors Anaphylaxis and Swelling    Swelling of tongue.    No prescriptions prior to admission.     Review of Systems  Constitutional: Positive for malaise/fatigue. Negative for chills, fever and weight loss.  Eyes: Negative for blurred vision and double vision.  Respiratory: Negative for shortness of breath.   Cardiovascular: Negative for chest pain.  Gastrointestinal: Positive for abdominal pain. Negative for heartburn, nausea and vomiting.  Genitourinary: Negative for dysuria.  Musculoskeletal: Positive for back pain. Negative for myalgias.  Skin: Negative for itching and rash.  Neurological: Negative for dizziness.  Psychiatric/Behavioral: Negative for depression, hallucinations, substance abuse and suicidal ideas. The patient is not nervous/anxious.     There were no vitals taken for this visit. Physical Exam  Constitutional: She is oriented to person, place, and time. She appears well-developed and well-nourished.  Neck: Normal range of motion.  Cardiovascular: Normal rate.   Respiratory: Effort normal.  GI: Soft. She exhibits distension.  Genitourinary: Vagina normal.  Genitourinary Comments: Fibroid uterus  Musculoskeletal: Normal range of motion.  Neurological: She is alert and oriented to person, place, and time.  Skin: Skin is warm.  Psychiatric: She has a normal mood and affect. Her behavior is normal. Judgment and thought content normal.    No results found for this or any previous visit (from the past 24 hour(s)).  No results found.  Assessment/Plan: [id number] with fibroids causing anemia for definitve treatment via  total laparoscopic hysterectomy with bilateral salpingo-oopherectomy, possible open, and possible cystoscopy R/B/A reviewed and all questions answered To OR when ready Consent confirmed  [name] 09/27/2017, 11:33  AM

## 2017-09-28 ENCOUNTER — Encounter (HOSPITAL_COMMUNITY)
Admission: RE | Admit: 2017-09-28 | Discharge: 2017-09-28 | Disposition: A | Discharge status: Home / Self Care | Payer: Medicaid Other | Source: Ambulatory Visit | Attending: Obstetrics and Gynecology | Admitting: Obstetrics and Gynecology

## 2017-09-28 ENCOUNTER — Encounter (HOSPITAL_COMMUNITY): Payer: Self-pay

## 2017-09-28 DIAGNOSIS — Z01812 Encounter for preprocedural laboratory examination: Secondary | ICD-10-CM | POA: Diagnosis not present

## 2017-09-28 DIAGNOSIS — D649 Anemia, unspecified: Secondary | ICD-10-CM | POA: Diagnosis not present

## 2017-09-28 DIAGNOSIS — Z0183 Encounter for blood typing: Secondary | ICD-10-CM | POA: Insufficient documentation

## 2017-09-28 DIAGNOSIS — D259 Leiomyoma of uterus, unspecified: Secondary | ICD-10-CM | POA: Diagnosis not present

## 2017-09-28 HISTORY — DX: Other fatigue: R53.83

## 2017-09-28 HISTORY — DX: Tremor, unspecified: R25.1

## 2017-09-28 HISTORY — DX: Nausea with vomiting, unspecified: R11.2

## 2017-09-28 HISTORY — DX: Other specified postprocedural states: Z98.890

## 2017-09-28 LAB — BASIC METABOLIC PANEL
Anion gap: 8 (ref 5–15)
BUN: 8 mg/dL (ref 6–20)
CALCIUM: 9 mg/dL (ref 8.9–10.3)
CHLORIDE: 104 mmol/L (ref 101–111)
CO2: 26 mmol/L (ref 22–32)
CREATININE: 0.92 mg/dL (ref 0.44–1.00)
GFR calc non Af Amer: >60 mL/min (ref >60)
GLUCOSE: 76 mg/dL (ref 65–99)
Potassium: 3.7 mmol/L (ref 3.5–5.1)
Sodium: 138 mmol/L (ref 135–145)

## 2017-09-28 LAB — CBC
HCT: 33.4 % — ABNORMAL LOW (ref 36.0–46.0)
Hemoglobin: 10.6 g/dL — ABNORMAL LOW (ref 12.0–15.0)
MCH: 25.5 pg — ABNORMAL LOW (ref 26.0–34.0)
MCHC: 31.7 g/dL (ref 30.0–36.0)
MCV: 80.5 fL (ref 78.0–100.0)
PLATELETS: 507 10*3/uL — AB (ref 150–400)
RBC: 4.15 MIL/uL (ref 3.87–5.11)
RDW: 16.7 % — AB (ref 11.5–15.5)
WBC: 7.8 10*3/uL (ref 4.0–10.5)

## 2017-09-28 LAB — ABO/RH: ABO/RH(D): O POS

## 2017-09-28 LAB — PREGNANCY, URINE: Preg Test, Ur: NEGATIVE

## 2017-09-28 NOTE — Progress Notes (Signed)
At pre-op appt, RN called patient from waiting room and as entering doorway, patient reported feeling dizzy and stated "just please give me a minute". RN escorted patient to sit down in office. Pt appeared to be concentrating on breathing deeply. RN offered patient cup of water , patient replied "i actually have cold water in my purse" and began to take sips of the water. RN inquired if patient has had dizziness like this before; patient states " yes at least 2 times a day and it feels like the room is spinning and I just have to have a second". Patient has hx of palpations noted on medical record and also self reports diagnosis of vertigo but not currently on medication for tx. Patient is currently on narcotic pain medicine and lasix and is undergoing surgery for fibroids and anemia . Patient denies cardiac sx(ie no chest pain, tightness etc; altered/blurry vision, nausea vomiting, headache etc.). Initial BP taken right after dizzy spell measures 168/126, repeat BP 20 minutes later reads 160/99. EKG on 12-06-16 reads NSR and reviewed by patient cardiologist Dr Curt Bears (see LOV note on 02-15-17) . Dizzy spell lasted about 5 minutes after which patient was able to complete PAT appt with appropriate mentation and asymptomatic. RN educated patient on s/s of heart attack and stroke and to call 911 if she experiences any. patient verbalized understanding. Patient had blood drawn and independently ambulated to leave PAT department .

## 2017-09-29 ENCOUNTER — Ambulatory Visit: Payer: Self-pay

## 2017-10-03 ENCOUNTER — Ambulatory Visit: Payer: Medicaid Other

## 2017-10-03 DIAGNOSIS — M779 Enthesopathy, unspecified: Secondary | ICD-10-CM

## 2017-10-03 DIAGNOSIS — M76829 Posterior tibial tendinitis, unspecified leg: Secondary | ICD-10-CM

## 2017-10-03 NOTE — Progress Notes (Signed)
Patient presents for orthotic pick up.  Verbal and written break in and wear instructions given.  Patient will follow up in 4 weeks if symptoms worsen or fail to improve. 

## 2017-10-03 NOTE — Patient Instructions (Signed)

## 2017-10-05 MED ORDER — DEXTROSE 5 % IV SOLN
3.0000 g | INTRAVENOUS | Status: DC
  Filled 2017-10-05: qty 3000

## 2017-10-06 ENCOUNTER — Inpatient Hospital Stay (HOSPITAL_BASED_OUTPATIENT_CLINIC_OR_DEPARTMENT_OTHER)
Admission: RE | Admit: 2017-10-06 | Discharge: 2017-10-09 | DRG: 743 | Disposition: A | Discharge status: Home / Self Care | Payer: Medicaid Other | Source: Ambulatory Visit | Attending: Obstetrics and Gynecology | Admitting: Obstetrics and Gynecology

## 2017-10-06 ENCOUNTER — Ambulatory Visit (HOSPITAL_BASED_OUTPATIENT_CLINIC_OR_DEPARTMENT_OTHER): Payer: Medicaid Other | Admitting: Anesthesiology

## 2017-10-06 ENCOUNTER — Encounter (HOSPITAL_BASED_OUTPATIENT_CLINIC_OR_DEPARTMENT_OTHER): Payer: Self-pay | Admitting: Anesthesiology

## 2017-10-06 ENCOUNTER — Encounter (HOSPITAL_COMMUNITY)
Admission: RE | Disposition: A | Discharge status: Home / Self Care | Payer: Self-pay | Source: Ambulatory Visit | Attending: Obstetrics and Gynecology

## 2017-10-06 DIAGNOSIS — I10 Essential (primary) hypertension: Secondary | ICD-10-CM | POA: Diagnosis present

## 2017-10-06 DIAGNOSIS — Z833 Family history of diabetes mellitus: Secondary | ICD-10-CM

## 2017-10-06 DIAGNOSIS — Z841 Family history of disorders of kidney and ureter: Secondary | ICD-10-CM | POA: Diagnosis not present

## 2017-10-06 DIAGNOSIS — Z888 Allergy status to other drugs, medicaments and biological substances status: Secondary | ICD-10-CM

## 2017-10-06 DIAGNOSIS — D649 Anemia, unspecified: Secondary | ICD-10-CM | POA: Diagnosis present

## 2017-10-06 DIAGNOSIS — Z23 Encounter for immunization: Secondary | ICD-10-CM | POA: Diagnosis not present

## 2017-10-06 DIAGNOSIS — N736 Female pelvic peritoneal adhesions (postinfective): Secondary | ICD-10-CM | POA: Diagnosis present

## 2017-10-06 DIAGNOSIS — D259 Leiomyoma of uterus, unspecified: Principal | ICD-10-CM | POA: Diagnosis present

## 2017-10-06 DIAGNOSIS — E669 Obesity, unspecified: Secondary | ICD-10-CM | POA: Diagnosis present

## 2017-10-06 DIAGNOSIS — Z8249 Family history of ischemic heart disease and other diseases of the circulatory system: Secondary | ICD-10-CM | POA: Diagnosis not present

## 2017-10-06 DIAGNOSIS — Z6839 Body mass index (BMI) 39.0-39.9, adult: Secondary | ICD-10-CM | POA: Diagnosis not present

## 2017-10-06 DIAGNOSIS — Z9889 Other specified postprocedural states: Secondary | ICD-10-CM

## 2017-10-06 DIAGNOSIS — E876 Hypokalemia: Secondary | ICD-10-CM | POA: Diagnosis present

## 2017-10-06 DIAGNOSIS — Z800 Family history of malignant neoplasm of digestive organs: Secondary | ICD-10-CM | POA: Diagnosis not present

## 2017-10-06 DIAGNOSIS — N920 Excessive and frequent menstruation with regular cycle: Secondary | ICD-10-CM | POA: Diagnosis present

## 2017-10-06 DIAGNOSIS — Z5331 Laparoscopic surgical procedure converted to open procedure: Secondary | ICD-10-CM | POA: Diagnosis not present

## 2017-10-06 DIAGNOSIS — Z8349 Family history of other endocrine, nutritional and metabolic diseases: Secondary | ICD-10-CM

## 2017-10-06 HISTORY — PX: TOTAL LAPAROSCOPIC HYSTERECTOMY WITH SALPINGECTOMY: SHX6742

## 2017-10-06 HISTORY — DX: Depression, unspecified: F32.A

## 2017-10-06 HISTORY — DX: Major depressive disorder, single episode, unspecified: F32.9

## 2017-10-06 LAB — TYPE AND SCREEN
ABO/RH(D): O POS
ANTIBODY SCREEN: NEGATIVE

## 2017-10-06 SURGERY — HYSTERECTOMY, TOTAL, LAPAROSCOPIC, WITH SALPINGECTOMY
Anesthesia: General | Site: Abdomen | Laterality: Bilateral

## 2017-10-06 MED ORDER — LIDOCAINE 2% (20 MG/ML) 5 ML SYRINGE
INTRAMUSCULAR | Status: DC | PRN
  Administered 2017-10-06: 80 mg via INTRAVENOUS

## 2017-10-06 MED ORDER — FENTANYL CITRATE (PF) 100 MCG/2ML IJ SOLN
INTRAMUSCULAR | Status: AC
Start: 2017-10-06 — End: ?
  Filled 2017-10-06: qty 2

## 2017-10-06 MED ORDER — BUPROPION HCL ER (XL) 300 MG PO TB24
300.0000 mg | ORAL_TABLET | Freq: Every day | ORAL | Status: DC
  Administered 2017-10-08 – 2017-10-09 (×2): 300 mg via ORAL
  Filled 2017-10-06 (×3): qty 1

## 2017-10-06 MED ORDER — FENTANYL CITRATE (PF) 250 MCG/5ML IJ SOLN
INTRAMUSCULAR | Status: AC
  Filled 2017-10-06: qty 5

## 2017-10-06 MED ORDER — SIMETHICONE 80 MG PO CHEW
80.0000 mg | CHEWABLE_TABLET | Freq: Four times a day (QID) | ORAL | Status: DC | PRN
  Administered 2017-10-08: 80 mg via ORAL
  Filled 2017-10-06 (×2): qty 1

## 2017-10-06 MED ORDER — SUCCINYLCHOLINE CHLORIDE 200 MG/10ML IV SOSY
PREFILLED_SYRINGE | INTRAVENOUS | Status: DC | PRN
  Administered 2017-10-06: 140 mg via INTRAVENOUS

## 2017-10-06 MED ORDER — PROPOFOL 10 MG/ML IV BOLUS
INTRAVENOUS | Status: AC
  Filled 2017-10-06: qty 40

## 2017-10-06 MED ORDER — ZOLPIDEM TARTRATE 5 MG PO TABS
5.0000 mg | ORAL_TABLET | Freq: Every evening | ORAL | Status: DC | PRN
  Administered 2017-10-07: 5 mg via ORAL
  Filled 2017-10-06 (×2): qty 1

## 2017-10-06 MED ORDER — SUCCINYLCHOLINE CHLORIDE 200 MG/10ML IV SOSY
PREFILLED_SYRINGE | INTRAVENOUS | Status: AC
  Filled 2017-10-06: qty 10

## 2017-10-06 MED ORDER — OXYCODONE HCL 5 MG PO TABS
5.0000 mg | ORAL_TABLET | Freq: Once | ORAL | Status: DC | PRN
  Filled 2017-10-06: qty 1

## 2017-10-06 MED ORDER — ONDANSETRON HCL 4 MG/2ML IJ SOLN
INTRAMUSCULAR | Status: DC | PRN
  Administered 2017-10-06: 4 mg via INTRAVENOUS

## 2017-10-06 MED ORDER — FENTANYL CITRATE (PF) 100 MCG/2ML IJ SOLN
25.0000 ug | INTRAMUSCULAR | Status: DC | PRN
  Filled 2017-10-06: qty 1

## 2017-10-06 MED ORDER — PROPOFOL 10 MG/ML IV BOLUS
INTRAVENOUS | Status: DC | PRN
  Administered 2017-10-06: 150 mg via INTRAVENOUS

## 2017-10-06 MED ORDER — LOSARTAN POTASSIUM 50 MG PO TABS
100.0000 mg | ORAL_TABLET | Freq: Every day | ORAL | Status: DC
  Filled 2017-10-06: qty 2

## 2017-10-06 MED ORDER — BUPIVACAINE HCL (PF) 0.25 % IJ SOLN
INTRAMUSCULAR | Status: DC | PRN
  Administered 2017-10-06: 10 mL

## 2017-10-06 MED ORDER — DIPHENHYDRAMINE HCL 12.5 MG/5ML PO ELIX
12.5000 mg | ORAL_SOLUTION | Freq: Four times a day (QID) | ORAL | Status: DC | PRN
  Filled 2017-10-06: qty 5

## 2017-10-06 MED ORDER — ONDANSETRON HCL 4 MG/2ML IJ SOLN
4.0000 mg | Freq: Once | INTRAMUSCULAR | Status: DC | PRN
  Filled 2017-10-06: qty 2

## 2017-10-06 MED ORDER — FENTANYL CITRATE (PF) 100 MCG/2ML IJ SOLN
INTRAMUSCULAR | Status: DC | PRN
  Administered 2017-10-06 (×3): 50 ug via INTRAVENOUS
  Administered 2017-10-06: 100 ug via INTRAVENOUS
  Administered 2017-10-06: 50 ug via INTRAVENOUS

## 2017-10-06 MED ORDER — ROCURONIUM BROMIDE 50 MG/5ML IV SOSY
PREFILLED_SYRINGE | INTRAVENOUS | Status: AC
  Filled 2017-10-06: qty 10

## 2017-10-06 MED ORDER — SCOPOLAMINE 1 MG/3DAYS TD PT72
MEDICATED_PATCH | TRANSDERMAL | Status: AC
  Filled 2017-10-06: qty 1

## 2017-10-06 MED ORDER — NALOXONE HCL 0.4 MG/ML IJ SOLN
0.4000 mg | INTRAMUSCULAR | Status: DC | PRN
  Filled 2017-10-06: qty 1

## 2017-10-06 MED ORDER — LIDOCAINE 2% (20 MG/ML) 5 ML SYRINGE
INTRAMUSCULAR | Status: AC
  Filled 2017-10-06: qty 5

## 2017-10-06 MED ORDER — DIPHENHYDRAMINE HCL 12.5 MG/5ML PO ELIX: 12.5000 mg | ORAL_SOLUTION | Freq: Four times a day (QID) | ORAL | Status: DC | PRN

## 2017-10-06 MED ORDER — KETOROLAC TROMETHAMINE 30 MG/ML IJ SOLN
INTRAMUSCULAR | Status: DC | PRN
  Administered 2017-10-06: 30 mg via INTRAVENOUS

## 2017-10-06 MED ORDER — OXYCODONE-ACETAMINOPHEN 5-325 MG PO TABS
1.0000 | ORAL_TABLET | ORAL | Status: DC | PRN
  Administered 2017-10-06 – 2017-10-08 (×3): 2 via ORAL
  Administered 2017-10-08: 1 via ORAL
  Administered 2017-10-09 (×3): 2 via ORAL
  Filled 2017-10-06 (×2): qty 2
  Filled 2017-10-06: qty 1
  Filled 2017-10-06 (×5): qty 2

## 2017-10-06 MED ORDER — IBUPROFEN 200 MG PO TABS
600.0000 mg | ORAL_TABLET | Freq: Four times a day (QID) | ORAL | Status: DC | PRN
  Administered 2017-10-08: 600 mg via ORAL
  Filled 2017-10-06: qty 3
  Filled 2017-10-06: qty 1

## 2017-10-06 MED ORDER — NALOXONE HCL 0.4 MG/ML IJ SOLN: 0.4000 mg | INTRAMUSCULAR | Status: DC | PRN

## 2017-10-06 MED ORDER — LACTATED RINGERS IV SOLN
INTRAVENOUS | Status: DC
  Administered 2017-10-06: 08:00:00 via INTRAVENOUS
  Filled 2017-10-06: qty 1000

## 2017-10-06 MED ORDER — PANTOPRAZOLE SODIUM 40 MG PO TBEC
40.0000 mg | DELAYED_RELEASE_TABLET | Freq: Every day | ORAL | Status: DC
  Administered 2017-10-07 – 2017-10-09 (×3): 40 mg via ORAL
  Filled 2017-10-06 (×5): qty 1

## 2017-10-06 MED ORDER — SCOPOLAMINE 1 MG/3DAYS TD PT72
1.0000 | MEDICATED_PATCH | TRANSDERMAL | Status: DC
  Administered 2017-10-06: 1 via TRANSDERMAL
  Filled 2017-10-06 (×2): qty 1

## 2017-10-06 MED ORDER — ONDANSETRON HCL 4 MG/2ML IJ SOLN
4.0000 mg | Freq: Four times a day (QID) | INTRAMUSCULAR | Status: DC | PRN
  Administered 2017-10-07 (×2): 4 mg via INTRAVENOUS
  Filled 2017-10-06: qty 2

## 2017-10-06 MED ORDER — PRIMIDONE 50 MG PO TABS
200.0000 mg | ORAL_TABLET | ORAL | Status: DC
  Filled 2017-10-06: qty 4

## 2017-10-06 MED ORDER — DIPHENHYDRAMINE HCL 50 MG/ML IJ SOLN: 12.5000 mg | Freq: Four times a day (QID) | INTRAMUSCULAR | Status: DC | PRN

## 2017-10-06 MED ORDER — HYDROMORPHONE HCL 1 MG/ML IJ SOLN
0.5000 mg | INTRAMUSCULAR | Status: DC | PRN
  Filled 2017-10-06: qty 0.5

## 2017-10-06 MED ORDER — LACTATED RINGERS IV SOLN
INTRAVENOUS | Status: DC
  Administered 2017-10-06: 19:00:00 via INTRAVENOUS
  Administered 2017-10-08: 1000 mL via INTRAVENOUS
  Filled 2017-10-06: qty 1000

## 2017-10-06 MED ORDER — OXYCODONE HCL 5 MG PO TABS
5.0000 mg | ORAL_TABLET | Freq: Once | ORAL | Status: DC | PRN
Start: 2017-10-06 — End: 2017-10-06
  Filled 2017-10-06: qty 1

## 2017-10-06 MED ORDER — ROCURONIUM BROMIDE 10 MG/ML (PF) SYRINGE
PREFILLED_SYRINGE | INTRAVENOUS | Status: DC | PRN
  Administered 2017-10-06: 25 mg via INTRAVENOUS
  Administered 2017-10-06: 15 mg via INTRAVENOUS
  Administered 2017-10-06: 50 mg via INTRAVENOUS
  Administered 2017-10-06: 10 mg via INTRAVENOUS

## 2017-10-06 MED ORDER — LACTATED RINGERS IV SOLN
INTRAVENOUS | Status: DC
  Administered 2017-10-06 (×3): via INTRAVENOUS
  Filled 2017-10-06: qty 1000

## 2017-10-06 MED ORDER — OXYCODONE HCL 5 MG/5ML PO SOLN
5.0000 mg | Freq: Once | ORAL | Status: DC | PRN
  Filled 2017-10-06: qty 5

## 2017-10-06 MED ORDER — ARTIFICIAL TEARS OPHTHALMIC OINT
TOPICAL_OINTMENT | OPHTHALMIC | Status: AC
  Filled 2017-10-06: qty 3.5

## 2017-10-06 MED ORDER — SODIUM CHLORIDE 0.9% FLUSH
9.0000 mL | INTRAVENOUS | Status: DC | PRN
  Filled 2017-10-06: qty 10

## 2017-10-06 MED ORDER — DEXAMETHASONE SODIUM PHOSPHATE 10 MG/ML IJ SOLN
INTRAMUSCULAR | Status: AC
  Filled 2017-10-06: qty 1

## 2017-10-06 MED ORDER — MIDAZOLAM HCL 2 MG/2ML IJ SOLN
INTRAMUSCULAR | Status: AC
  Filled 2017-10-06: qty 2

## 2017-10-06 MED ORDER — CEFAZOLIN SODIUM-DEXTROSE 2-4 GM/100ML-% IV SOLN
INTRAVENOUS | Status: AC
  Filled 2017-10-06: qty 100

## 2017-10-06 MED ORDER — ONDANSETRON HCL 4 MG/2ML IJ SOLN
4.0000 mg | Freq: Four times a day (QID) | INTRAMUSCULAR | Status: DC | PRN
  Filled 2017-10-06: qty 2

## 2017-10-06 MED ORDER — ACETAMINOPHEN 10 MG/ML IV SOLN
INTRAVENOUS | Status: AC
  Filled 2017-10-06: qty 100

## 2017-10-06 MED ORDER — DEXTROSE 5 % IV SOLN
3.0000 g | INTRAVENOUS | Status: AC
  Administered 2017-10-06: 3 g via INTRAVENOUS
  Filled 2017-10-06: qty 3

## 2017-10-06 MED ORDER — SUGAMMADEX SODIUM 200 MG/2ML IV SOLN
INTRAVENOUS | Status: AC
  Filled 2017-10-06: qty 2

## 2017-10-06 MED ORDER — ONDANSETRON HCL 4 MG/2ML IJ SOLN
INTRAMUSCULAR | Status: AC
  Filled 2017-10-06: qty 2

## 2017-10-06 MED ORDER — SODIUM CHLORIDE 0.9% FLUSH: 9.0000 mL | INTRAVENOUS | Status: DC | PRN

## 2017-10-06 MED ORDER — DOCUSATE SODIUM 100 MG PO CAPS
100.0000 mg | ORAL_CAPSULE | Freq: Two times a day (BID) | ORAL | Status: DC
  Administered 2017-10-06 – 2017-10-09 (×5): 100 mg via ORAL
  Filled 2017-10-06 (×7): qty 1

## 2017-10-06 MED ORDER — KETOROLAC TROMETHAMINE 30 MG/ML IJ SOLN
30.0000 mg | Freq: Once | INTRAMUSCULAR | Status: DC
  Filled 2017-10-06: qty 1

## 2017-10-06 MED ORDER — HYDROMORPHONE HCL-NACL 0.5-0.9 MG/ML-% IV SOSY
PREFILLED_SYRINGE | INTRAVENOUS | Status: AC
  Filled 2017-10-06: qty 1

## 2017-10-06 MED ORDER — HYDROMORPHONE HCL 1 MG/ML IJ SOLN
0.5000 mg | INTRAMUSCULAR | Status: DC | PRN
  Administered 2017-10-06 (×2): 0.5 mg via INTRAVENOUS
  Filled 2017-10-06: qty 0.5

## 2017-10-06 MED ORDER — DIPHENHYDRAMINE HCL 50 MG/ML IJ SOLN
12.5000 mg | Freq: Four times a day (QID) | INTRAMUSCULAR | Status: DC | PRN
  Filled 2017-10-06: qty 0.25

## 2017-10-06 MED ORDER — DEXAMETHASONE SODIUM PHOSPHATE 10 MG/ML IJ SOLN
INTRAMUSCULAR | Status: DC | PRN
  Administered 2017-10-06: 10 mg via INTRAVENOUS

## 2017-10-06 MED ORDER — CARVEDILOL 6.25 MG PO TABS
18.7500 mg | ORAL_TABLET | Freq: Two times a day (BID) | ORAL | Status: DC
  Administered 2017-10-06 – 2017-10-09 (×5): 18.75 mg via ORAL
  Filled 2017-10-06 (×7): qty 1

## 2017-10-06 MED ORDER — KETOROLAC TROMETHAMINE 30 MG/ML IJ SOLN
INTRAMUSCULAR | Status: AC
  Filled 2017-10-06: qty 1

## 2017-10-06 MED ORDER — LOSARTAN POTASSIUM 50 MG PO TABS
100.0000 mg | ORAL_TABLET | Freq: Every day | ORAL | Status: DC
  Administered 2017-10-06 – 2017-10-09 (×4): 100 mg via ORAL
  Filled 2017-10-06 (×4): qty 2

## 2017-10-06 MED ORDER — MIDAZOLAM HCL 2 MG/2ML IJ SOLN
INTRAMUSCULAR | Status: DC | PRN
  Administered 2017-10-06: 2 mg via INTRAVENOUS

## 2017-10-06 MED ORDER — BUPROPION HCL ER (XL) 150 MG PO TB24
150.0000 mg | ORAL_TABLET | Freq: Every day | ORAL | Status: DC
  Filled 2017-10-06: qty 1

## 2017-10-06 MED ORDER — CEFAZOLIN SODIUM-DEXTROSE 2-4 GM/100ML-% IV SOLN
2.0000 g | INTRAVENOUS | Status: DC
  Filled 2017-10-06: qty 100

## 2017-10-06 MED ORDER — INFLUENZA VAC SPLIT QUAD 0.5 ML IM SUSY
0.5000 mL | PREFILLED_SYRINGE | INTRAMUSCULAR | Status: AC
  Administered 2017-10-09: 0.5 mL via INTRAMUSCULAR
  Filled 2017-10-06: qty 0.5

## 2017-10-06 MED ORDER — FENTANYL CITRATE (PF) 100 MCG/2ML IJ SOLN
INTRAMUSCULAR | Status: AC
  Filled 2017-10-06: qty 2

## 2017-10-06 MED ORDER — MORPHINE SULFATE 2 MG/ML IV SOLN
INTRAVENOUS | Status: DC
  Administered 2017-10-06: 15:00:00 via INTRAVENOUS
  Filled 2017-10-06 (×2): qty 30

## 2017-10-06 MED ORDER — ONDANSETRON HCL 4 MG/2ML IJ SOLN
4.0000 mg | Freq: Four times a day (QID) | INTRAMUSCULAR | Status: DC | PRN
  Filled 2017-10-06 (×2): qty 2

## 2017-10-06 MED ORDER — MENTHOL 3 MG MT LOZG
1.0000 | LOZENGE | OROMUCOSAL | Status: DC | PRN
  Filled 2017-10-06: qty 9

## 2017-10-06 MED ORDER — FUROSEMIDE 20 MG PO TABS
20.0000 mg | ORAL_TABLET | Freq: Two times a day (BID) | ORAL | Status: DC
  Administered 2017-10-06 – 2017-10-09 (×5): 20 mg via ORAL
  Filled 2017-10-06 (×7): qty 1

## 2017-10-06 MED ORDER — PRIMIDONE 50 MG PO TABS
200.0000 mg | ORAL_TABLET | Freq: Every day | ORAL | Status: DC
  Administered 2017-10-06 – 2017-10-08 (×3): 200 mg via ORAL
  Filled 2017-10-06 (×3): qty 4

## 2017-10-06 MED ORDER — SUGAMMADEX SODIUM 200 MG/2ML IV SOLN
INTRAVENOUS | Status: DC | PRN
  Administered 2017-10-06: 200 mg via INTRAVENOUS

## 2017-10-06 MED ORDER — ONDANSETRON HCL 4 MG PO TABS
4.0000 mg | ORAL_TABLET | Freq: Four times a day (QID) | ORAL | Status: DC | PRN
  Filled 2017-10-06: qty 1

## 2017-10-06 MED ORDER — HYDROMORPHONE 1 MG/ML IV SOLN
INTRAVENOUS | Status: DC
  Administered 2017-10-06: 1.3 mg via INTRAVENOUS
  Administered 2017-10-06: 19:00:00 via INTRAVENOUS
  Administered 2017-10-07: 4 mg via INTRAVENOUS
  Administered 2017-10-07: 4.4 mg via INTRAVENOUS
  Administered 2017-10-07: 2.4 mg via INTRAVENOUS
  Filled 2017-10-06: qty 25

## 2017-10-06 MED ORDER — ACETAMINOPHEN 10 MG/ML IV SOLN
INTRAVENOUS | Status: DC | PRN
  Administered 2017-10-06: 1000 mg via INTRAVENOUS

## 2017-10-06 MED ORDER — FENTANYL CITRATE (PF) 100 MCG/2ML IJ SOLN
25.0000 ug | INTRAMUSCULAR | Status: DC | PRN
  Administered 2017-10-06 (×2): 50 ug via INTRAVENOUS
  Filled 2017-10-06: qty 1

## 2017-10-06 SURGICAL SUPPLY — 86 items
ADH SKN CLS APL DERMABOND .7 (GAUZE/BANDAGES/DRESSINGS) ×2
BARRIER ADHS 3X4 INTERCEED (GAUZE/BANDAGES/DRESSINGS) IMPLANT
BLADE SURG 10 STRL SS (BLADE) ×3 IMPLANT
BRR ADH 4X3 ABS CNTRL BYND (GAUZE/BANDAGES/DRESSINGS)
CABLE HIGH FREQUENCY MONO STRZ (ELECTRODE) IMPLANT
CANISTER SUCT 3000ML PPV (MISCELLANEOUS) ×4 IMPLANT
CELL SAVER LIPIGURD (MISCELLANEOUS) IMPLANT
CLOTH BEACON ORANGE TIMEOUT ST (SAFETY) ×4 IMPLANT
COUNTER NEEDLE 1200 MAGNETIC (NEEDLE) ×3 IMPLANT
COVER BACK TABLE 60X90IN (DRAPES) ×4 IMPLANT
COVER MAYO STAND STRL (DRAPES) ×4 IMPLANT
COVER SURGICAL LIGHT HANDLE (MISCELLANEOUS) ×8 IMPLANT
DECANTER SPIKE VIAL GLASS SM (MISCELLANEOUS) IMPLANT
DERMABOND ADVANCED (GAUZE/BANDAGES/DRESSINGS) ×2
DERMABOND ADVANCED .7 DNX12 (GAUZE/BANDAGES/DRESSINGS) ×2 IMPLANT
DEVICE RETRIEVAL ALEXIS 14 (MISCELLANEOUS) IMPLANT
DEVICE SUTURE ENDOST 10MM (ENDOMECHANICALS) IMPLANT
DRAPE WARM FLUID 44X44 (DRAPE) ×3 IMPLANT
DRSG OPSITE POSTOP 3X4 (GAUZE/BANDAGES/DRESSINGS) ×3 IMPLANT
DRSG OPSITE POSTOP 4X10 (GAUZE/BANDAGES/DRESSINGS) ×3 IMPLANT
DURAPREP 26ML APPLICATOR (WOUND CARE) ×4 IMPLANT
ELECT BLADE 6.5 .24CM SHAFT (ELECTRODE) ×3 IMPLANT
EXTRT SYSTEM ALEXIS 14CM (MISCELLANEOUS)
EXTRT SYSTEM ALEXIS 17CM (MISCELLANEOUS)
FILTER SMOKE EVAC LAPAROSHD (FILTER) ×4 IMPLANT
GAUZE SPONGE 4X4 16PLY XRAY LF (GAUZE/BANDAGES/DRESSINGS) IMPLANT
GLOVE BIO SURGEON STRL SZ 6.5 (GLOVE) ×6 IMPLANT
GLOVE BIO SURGEON STRL SZ7 (GLOVE) ×6 IMPLANT
GLOVE BIO SURGEONS STRL SZ 6.5 (GLOVE) ×2
GLOVE BIOGEL PI IND STRL 7.0 (GLOVE) ×6 IMPLANT
GLOVE BIOGEL PI IND STRL 7.5 (GLOVE) ×4 IMPLANT
GLOVE BIOGEL PI INDICATOR 7.0 (GLOVE) ×6
GLOVE BIOGEL PI INDICATOR 7.5 (GLOVE) ×8
GOWN STRL REUS W/TWL LRG LVL3 (GOWN DISPOSABLE) ×12 IMPLANT
GOWN STRL REUS W/TWL XL LVL3 (GOWN DISPOSABLE) ×6 IMPLANT
NEEDLE HYPO 22GX1.5 SAFETY (NEEDLE) IMPLANT
NS IRRIG 1000ML POUR BTL (IV SOLUTION) ×13 IMPLANT
NS IRRIG 500ML POUR BTL (IV SOLUTION) ×4 IMPLANT
OCCLUDER COLPOPNEUMO (BALLOONS) ×4 IMPLANT
PACK ABDOMINAL GYN (CUSTOM PROCEDURE TRAY) ×4 IMPLANT
PACK LAVH (CUSTOM PROCEDURE TRAY) ×4 IMPLANT
PACK TRENDGUARD 450 HYBRID PRO (MISCELLANEOUS) ×1 IMPLANT
PACK TRENDGUARD 600 HYBRD PROC (MISCELLANEOUS) IMPLANT
PAD OB MATERNITY 4.3X12.25 (PERSONAL CARE ITEMS) ×4 IMPLANT
PROTECTOR NERVE ULNAR (MISCELLANEOUS) ×7 IMPLANT
RTRCTR C-SECT PINK 25CM LRG (MISCELLANEOUS) ×3 IMPLANT
SCISSORS LAP 5X35 DISP (ENDOMECHANICALS) IMPLANT
SET IRRIG TUBING LAPAROSCOPIC (IRRIGATION / IRRIGATOR) ×4 IMPLANT
SET IRRIG Y TYPE TUR BLADDER L (SET/KITS/TRAYS/PACK) IMPLANT
SHEARS HARMONIC ACE PLUS 36CM (ENDOMECHANICALS) IMPLANT
SLEEVE XCEL OPT CAN 5 100 (ENDOMECHANICALS) ×4 IMPLANT
SPONGE LAP 18X18 X RAY DECT (DISPOSABLE) ×11 IMPLANT
STAPLER VISISTAT 35W (STAPLE) IMPLANT
SUT ENDO VLOC 180-0-8IN (SUTURE) IMPLANT
SUT PDS AB 0 CTX 60 (SUTURE) IMPLANT
SUT PLAIN 2 0 XLH (SUTURE) IMPLANT
SUT VIC AB 0 CT1 18XCR BRD8 (SUTURE) ×5 IMPLANT
SUT VIC AB 0 CT1 27 (SUTURE) ×8
SUT VIC AB 0 CT1 27XBRD ANBCTR (SUTURE) ×4 IMPLANT
SUT VIC AB 0 CT1 36 (SUTURE) IMPLANT
SUT VIC AB 0 CT1 8-18 (SUTURE) ×12
SUT VIC AB 2-0 CT1 (SUTURE) ×3 IMPLANT
SUT VIC AB 2-0 CTB1 (SUTURE) ×4 IMPLANT
SUT VIC AB 4-0 KS 27 (SUTURE) ×7 IMPLANT
SUT VIC AB 4-0 PS2 27 (SUTURE) ×4 IMPLANT
SUT VICRYL 0 TIES 12 18 (SUTURE) ×3 IMPLANT
SUT VICRYL 0 UR6 27IN ABS (SUTURE) ×4 IMPLANT
SYR 30ML LL (SYRINGE) ×6 IMPLANT
SYR 50ML LL SCALE MARK (SYRINGE) ×4 IMPLANT
SYR CONTROL 10ML LL (SYRINGE) IMPLANT
SYRINGE 10CC LL (SYRINGE) ×4 IMPLANT
SYSTEM CARTER THOMASON II (TROCAR) ×4 IMPLANT
SYSTEM CONTND EXTRCTN KII BLLN (MISCELLANEOUS) IMPLANT
TIP UTERINE 5.1X6CM LAV DISP (MISCELLANEOUS) IMPLANT
TIP UTERINE 6.7X10CM GRN DISP (MISCELLANEOUS) ×6 IMPLANT
TIP UTERINE 6.7X6CM WHT DISP (MISCELLANEOUS) IMPLANT
TIP UTERINE 6.7X8CM BLUE DISP (MISCELLANEOUS) IMPLANT
TOWEL OR 17X24 6PK STRL BLUE (TOWEL DISPOSABLE) ×8 IMPLANT
TRAY FOLEY CATH SILVER 14FR (SET/KITS/TRAYS/PACK) ×4 IMPLANT
TRENDGUARD 450 HYBRID PRO PACK (MISCELLANEOUS) ×4
TRENDGUARD 600 HYBRID PROC PK (MISCELLANEOUS)
TROCAR PORT AIRSEAL 5X120 (TROCAR) ×3 IMPLANT
TROCAR XCEL NON-BLD 11X100MML (ENDOMECHANICALS) ×4 IMPLANT
TROCAR XCEL NON-BLD 5MMX100MML (ENDOMECHANICALS) ×4 IMPLANT
WARMER LAPAROSCOPE (MISCELLANEOUS) ×4 IMPLANT
WATER STERILE IRR 500ML POUR (IV SOLUTION) ×3 IMPLANT

## 2017-10-06 NOTE — Anesthesia Procedure Notes (Signed)
Procedure Name: Intubation Date/Time: 10/06/2017 8:32 AM Performed by: Wanita Chamberlain Pre-anesthesia Checklist: Patient identified, Emergency Drugs available, Suction available and Patient being monitored Patient Re-evaluated:Patient Re-evaluated prior to induction Oxygen Delivery Method: Circle system utilized Preoxygenation: Pre-oxygenation with 100% oxygen Induction Type: IV induction Ventilation: Mask ventilation without difficulty Laryngoscope Size: Mac and 4 Grade View: Grade I Tube size: 7.0 mm Number of attempts: 1 Airway Equipment and Method: Stylet Placement Confirmation: breath sounds checked- equal and bilateral,  positive ETCO2 and ETT inserted through vocal cords under direct vision Secured at: 21 cm Tube secured with: Tape Dental Injury: Teeth and Oropharynx as per pre-operative assessment  Future Recommendations: Recommend- induction with short-acting agent, and alternative techniques readily available Comments: Grade I view of v.cords once large tongue out of the way.

## 2017-10-06 NOTE — Progress Notes (Signed)
Surgeon at bedside. Pt reports having poor pain control at this time using PCA. Surgeon to address issue and review medication orders.

## 2017-10-06 NOTE — Brief Op Note (Addendum)
10/06/2017  11:31 AM  PATIENT:  Erin Bradford  46 y.o. female  PRE-OPERATIVE DIAGNOSIS:  menorrhagia, uterine leiomyoma  POST-OPERATIVE DIAGNOSIS:  menorrhagia, uterine leiomyoma  PROCEDURE:  Procedure(s): ATTEMPTED HYSTERECTOMY TOTAL LAPAROSCOPIC WITH SALPINGECTOMY CONVERTED TO OPEN ABDOMINAL TOTAL HYSTERECTOMY WITH SALPINGECTOMY (Bilateral)  SURGEON:  Surgeon(s) and Role:    * Banga, Bonnee Quin, DO - Primary    * Meisinger, Todd, MD - Assisting    ASSISTANTS: Tracey RNFA ANESTHESIA:   general  EBL:  Total I/O In: 2000 [I.V.:2000] Out: 825 [Urine:250; Blood:575]  BLOOD ADMINISTERED:none  DRAINS: none   LOCAL MEDICATIONS USED:  MARCAINE     SPECIMEN:Uterus, bilateral fallopian tubes, cervix DISPOSITION OF SPECIMEN:  PATHOLOGY  COUNTS:  YES  TOURNIQUET:  * No tourniquets in log *  DICTATION: .Note written in EPIC  PLAN OF CARE: Admit to inpatient   PATIENT DISPOSITION:  PACU - hemodynamically stable.   Delay start of Pharmacological VTE agent (>24hrs) due to surgical blood loss or risk of bleeding: no

## 2017-10-06 NOTE — Anesthesia Postprocedure Evaluation (Signed)
Anesthesia Post Note  Patient: Alianah R Langford  Procedure(s) Performed: ATTEMPTED HYSTERECTOMY TOTAL LAPAROSCOPIC WITH SALPINGECTOMY CONVERTED TO OPEN ABDOMINAL TOTAL HYSTERECTOMY WITH SALPINGECTOMY (Bilateral Abdomen)     Patient location during evaluation: PACU Anesthesia Type: General Level of consciousness: awake, awake and alert and oriented Pain management: pain level controlled Vital Signs Assessment: post-procedure vital signs reviewed and stable Respiratory status: spontaneous breathing, nonlabored ventilation and respiratory function stable Cardiovascular status: blood pressure returned to baseline Anesthetic complications: no    Last Vitals:  Vitals:   10/06/17 1315 10/06/17 1330  BP: (!) 161/97 (!) 171/95  Pulse: 75 77  Resp: 11 (!) 9  Temp:  36.8 C  SpO2: 94% 93%    Last Pain:  Vitals:   10/06/17 1330  TempSrc:   PainSc: 8                  Nirel Babler COKER

## 2017-10-06 NOTE — Interval H&P Note (Signed)
History and Physical Interval Note:  [birth year] 2:19 AM  Erin Bradford  has presented today for surgery, with the diagnosis of menorrhagia, uterine leiomyoma  The various methods of treatment have been discussed with the patient and family. After consideration of risks, benefits and other options for treatment, the patient has consented to  Procedure(s) with comments: HYSTERECTOMY TOTAL LAPAROSCOPIC WITH SALPINGECTOMY (Bilateral) - Outpatient extended recovery  CYSTOSCOPY (N/A) HYSTERECTOMY ABDOMINAL (N/A) - IF open AM Admit 2 night stay as a surgical intervention .  The patient's history has been reviewed, patient examined, no change in status, stable for surgery.  I have reviewed the patient's chart and labs.  Questions were answered to the patient's satisfaction.     [name]

## 2017-10-06 NOTE — Anesthesia Preprocedure Evaluation (Addendum)
Anesthesia Evaluation  Patient identified by MRN, date of birth, ID band Patient awake    Airway Mallampati: III  TM Distance: >3 FB Neck ROM: Full    Dental  (+) Teeth Intact, Dental Advisory Given   Pulmonary    breath sounds clear to auscultation       Cardiovascular hypertension,  Rhythm:Regular Rate:Normal     Neuro/Psych    GI/Hepatic   Endo/Other    Renal/GU      Musculoskeletal   Abdominal (+) + obese,   Peds  Hematology   Anesthesia Other Findings   Reproductive/Obstetrics                           Anesthesia Physical Anesthesia Plan  ASA: III  Anesthesia Plan: General   Post-op Pain Management:    Induction: Intravenous  PONV Risk Score and Plan: 1 and Ondansetron and Dexamethasone  Airway Management Planned: Oral ETT  Additional Equipment:   Intra-op Plan:   Post-operative Plan: Extubation in OR  Informed Consent: I have reviewed the patients History and Physical, chart, labs and discussed the procedure including the risks, benefits and alternatives for the proposed anesthesia with the patient or authorized representative who has indicated his/her understanding and acceptance.   Dental advisory given  Plan Discussed with: CRNA and Anesthesiologist  Anesthesia Plan Comments: (H/O palpitations holter (-) for afib/SVT Obesity Hypertension  Plan GA with oral ETT  Erin Bradford)       Anesthesia Quick Evaluation

## 2017-10-06 NOTE — Progress Notes (Addendum)
Day of Surgery Procedure(s) (LRB): ATTEMPTED HYSTERECTOMY TOTAL LAPAROSCOPIC WITH SALPINGECTOMY CONVERTED TO OPEN ABDOMINAL TOTAL HYSTERECTOMY WITH SALPINGECTOMY (Bilateral)  Subjective: Patient reports incisional pain.  Rates at 7/10 at best despite maxed out PCA. Teary. Deneis any SOB, CP, nausea.   Objective: I have reviewed patient's vital signs, intake and output and medications.  General: alert, mild distress and moderately obese GI: incision: clean, dry, intact and abdomen tender, mild distension, +BS Extremities: extremities normal, atraumatic, no cyanosis or edema  Assessment: s/p Procedure(s): ATTEMPTED HYSTERECTOMY TOTAL LAPAROSCOPIC WITH SALPINGECTOMY CONVERTED TO OPEN ABDOMINAL TOTAL HYSTERECTOMY WITH SALPINGECTOMY (Bilateral): stable and pain not well controlled; BP elevated  Plan: change from morphine to dilaudid PCA; PO percocet q 4 hours for pain now  Pt just received her BP meds - monitor BP closely; likely improve with better pain management as well. Reiterated parameters with nursing to notify re BP Routine post op care.    LOS: 0 days    Erin Bradford 10/06/2017, 6:06 PM

## 2017-10-06 NOTE — Transfer of Care (Signed)
Immediate Anesthesia Transfer of Care Note  Patient: [name]  Procedure(s) Performed: ATTEMPTED HYSTERECTOMY TOTAL LAPAROSCOPIC WITH SALPINGECTOMY CONVERTED TO OPEN ABDOMINAL TOTAL HYSTERECTOMY WITH SALPINGECTOMY (Bilateral Abdomen)  Patient Location: PACU  Anesthesia Type:General  Level of Consciousness: awake, alert , oriented and patient cooperative  Airway & Oxygen Therapy: Patient Spontanous Breathing and Patient connected to face mask oxygen  Post-op Assessment: Report given to RN and Post -op Vital signs reviewed and stable  Post vital signs: Reviewed and stable  Last Vitals:  Vitals:   10/06/17 0659  BP: (!) 141/93  Pulse: 73  Resp: 18  Temp: 37 C  SpO2: 99%    Last Pain:  Vitals:   10/06/17 0659  TempSrc: Oral      Patients Stated Pain Goal: 5 (66/59/93 5701)  Complications: No apparent anesthesia complications

## 2017-10-06 NOTE — Op Note (Signed)
PATIENT:  Erin Bradford  46 y.o. female  PRE-OPERATIVE DIAGNOSIS:  menorrhagia, uterine leiomyoma  POST-OPERATIVE DIAGNOSIS:  menorrhagia, uterine leiomyoma  PROCEDURE:  Procedure(s): ATTEMPTED HYSTERECTOMY TOTAL LAPAROSCOPIC WITH SALPINGECTOMY CONVERTED TO OPEN ABDOMINAL TOTAL HYSTERECTOMY WITH SALPINGECTOMY (Bilateral)  SURGEON:  Surgeon(s) and Role:    * Benito Lemmerman, Bonnee Quin, DO - Primary    * Meisinger, Todd, MD - Assisting    ASSISTANTS: Tracey RNFA ANESTHESIA:   general  EBL:  Total I/O In: 2000 [I.V.:2000] Out: 825 [Urine:250; Blood:575]  BLOOD ADMINISTERED:none  DRAINS: none   LOCAL MEDICATIONS USED:  MARCAINE     SPECIMEN:Uterus, bilateral fallopian tubes, cervix DISPOSITION OF SPECIMEN:  PATHOLOGY  COUNTS:  YES  Dictation:  Pt was taken to operating room where general anesthesia was administered and found to be adequate. Pt was placed in dorsal lithotomy position with her arms at her sides. A foley catheter was sterilely placed. Pt's abdomen was then prepped and draped in sterile fashion as well and an appropriate timeout performed. A weighted speculum was placed in the vagina with an anterior sims and the visualized cervix grasped with a tenaculum. The uterus was sounded to 12cm; the os was dilated with pratt dilaters. A retention stitch was placed at 3 and 9 o'clock and a size 10 KOH assembled and placed. Speculum was removed.Legs were lowere and attention turned to pts abdomen. An infraumbilical incision was made after injection with marcaine and a 59mm port placed under visualization while tenting abdomen upwards. Upon entry into the abdominal cavity, pneumoperitoneum was obtained with CO2 gas. A large, bulky anteverted uterus was noticed with fundus almost immediately under the umbilicus. It was difficult to manipulate the uterus laterally. A thick adhesive band was also noted adhering the uterus to the anterior abdomen also inhibiting the uterus from being  moved posteriorly. Due to poor visualization, decision was made to convert to abdominal approach.  A pfannestial skin incision was made with the scalpel through her previous incision and carried down to the level of the fascia. Small areas of bleeding were coagulated.The fascia was incised in the midline and the incision extended laterally with mayo scissors first undermining the fascia. Next, the superior aspect of the fascia was elevated with coker clamps and the underlying rectus muscles dissected away with sharp dissection due to scarring. In a similar fashion the lower aspect of the incision was also grasped, elevated and the fascia dissected from the rectus. The was extensive adhesiolysis using both bovie and sharp dissection to separate the omentum and peritoneum from the uterus. Hemostasis was maintained.  The alexis retractor was then placed and bowels tucked away with wet lap sponges to ensure good visualization.  Gross survey of the pelvic organs noted an enlarged uterus with multiple fibroids, grossly normal b/l ovaries and fallopian tubes. The uterine fundus was then grasped with two toothed tenaculums and elevated and the left fallopian tube was then grasped at its fimbriated end and excised from the nearby ovary hemostatically. Next the utero-ovarian ligament and the round ligaments were sequentially clamped cut and suture ligated. The broad ligament was then separated from the uterus. Great hemostasis noted. The same was done on the patients right with similar results.  Next the uterosacral ligaments and the cardinal ligaments were then clamped and cut with the ovarian vessel ligated and homostatic.  The bladder reflection was dissected off the cervix. Foley bag was confirmed to have clear urine.The cervix was then amputated from the superior vagina and the uterus, cervix  and fallopian tubes were removed off the field.  The vaginal cuff was closed next in three interrupted figure 8 stitches using  0-vicryl suture. The cardinal and uterosacral ligaments were then suspended to each other. Copious irrigation was done and no bleeding noted.  A final look into the abdominal cavity confirmed no abnormalities or bleeding hence peritoneum was closed next in a running fashion with 2-0 vicryl, the fascia layer was closed using 0- vicryl also in a running fashion from both ends. The subcuticular layer was undermined, irrigated and reapproximated using interrupted 3-0 vicryl stitches and finally the incision was closed using 4-0 vicryl on a keith needle. A honey comb dressing with pressure was applied. Counts were noted to be correct. Patient was awakened and taken to recovery room in stable status. She tolerated the procedure well.  Foley left in place

## 2017-10-07 ENCOUNTER — Encounter (HOSPITAL_BASED_OUTPATIENT_CLINIC_OR_DEPARTMENT_OTHER): Payer: Self-pay | Admitting: Obstetrics and Gynecology

## 2017-10-07 LAB — CBC
HEMATOCRIT: 28.3 % — AB (ref 36.0–46.0)
Hemoglobin: 9 g/dL — ABNORMAL LOW (ref 12.0–15.0)
MCH: 26.2 pg (ref 26.0–34.0)
MCHC: 31.8 g/dL (ref 30.0–36.0)
MCV: 82.3 fL (ref 78.0–100.0)
Platelets: 417 10*3/uL — ABNORMAL HIGH (ref 150–400)
RBC: 3.44 MIL/uL — ABNORMAL LOW (ref 3.87–5.11)
RDW: 16.7 % — AB (ref 11.5–15.5)
WBC: 14.1 10*3/uL — ABNORMAL HIGH (ref 4.0–10.5)

## 2017-10-07 MED ORDER — PROMETHAZINE HCL 25 MG/ML IJ SOLN
25.0000 mg | Freq: Four times a day (QID) | INTRAMUSCULAR | Status: DC | PRN
  Administered 2017-10-07: 25 mg via INTRAVENOUS
  Filled 2017-10-07: qty 1

## 2017-10-07 MED ORDER — HYDROMORPHONE 1 MG/ML IV SOLN
INTRAVENOUS | Status: DC
  Administered 2017-10-07: 2.2 mg via INTRAVENOUS
  Administered 2017-10-07: 2.4 mg via INTRAVENOUS
  Administered 2017-10-07: 1.4 mg via INTRAVENOUS
  Administered 2017-10-08: 1.2 mg via INTRAVENOUS
  Administered 2017-10-08: 25 mg via INTRAVENOUS
  Administered 2017-10-08: 3 mg via INTRAVENOUS
  Administered 2017-10-08: 4.2 mg via INTRAVENOUS
  Administered 2017-10-08: 4.5 mg via INTRAVENOUS
  Filled 2017-10-07 (×2): qty 25

## 2017-10-07 NOTE — Progress Notes (Signed)
1 Day Post-Op Procedure(s) (LRB): ATTEMPTED HYSTERECTOMY TOTAL LAPAROSCOPIC WITH SALPINGECTOMY CONVERTED TO OPEN ABDOMINAL TOTAL HYSTERECTOMY WITH SALPINGECTOMY (Bilateral)  Subjective: Patient reports tolerating PO.  Pain better controlled. No fever, chills, CP or SOB. Foley out but has not voided yet. No complaints  Objective: I have reviewed patient's vital signs, intake and output, medications and labs.  General: alert, fatigued and no distress GI: soft, non-tender; bowel sounds normal; no masses,  no organomegaly Extremities: Homans sign is negative, no sign of DVT; SCDs in place  Assessment: s/p Procedure(s): ATTEMPTED HYSTERECTOMY TOTAL LAPAROSCOPIC WITH SALPINGECTOMY CONVERTED TO OPEN ABDOMINAL TOTAL HYSTERECTOMY WITH SALPINGECTOMY (Bilateral): stable  Plan: Advance diet Encourage ambulation Advance to PO medication Discontinue IV fluids  LOS: 1 day    Jonna Dittrich W Sarrinah Gardin 10/07/2017, 9:52 AM

## 2017-10-08 MED ORDER — OXYCODONE-ACETAMINOPHEN 5-325 MG PO TABS: 1.0000 | ORAL_TABLET | ORAL | 0 refills | Status: DC | PRN

## 2017-10-08 MED ORDER — PROMETHAZINE HCL 25 MG PO TABS: 25.0000 mg | ORAL_TABLET | Freq: Four times a day (QID) | ORAL | 0 refills | Status: DC | PRN

## 2017-10-08 MED ORDER — IBUPROFEN 600 MG PO TABS: 600.0000 mg | ORAL_TABLET | Freq: Four times a day (QID) | ORAL | 2 refills | Status: DC | PRN

## 2017-10-08 MED ORDER — DOCUSATE SODIUM 100 MG PO CAPS: 100.0000 mg | ORAL_CAPSULE | Freq: Two times a day (BID) | ORAL | 0 refills | Status: DC

## 2017-10-08 MED ORDER — HYDROMORPHONE HCL 2 MG PO TABS
1.0000 mg | ORAL_TABLET | ORAL | Status: DC | PRN
  Administered 2017-10-08: 1 mg via ORAL
  Filled 2017-10-08: qty 1

## 2017-10-08 NOTE — Discharge Summary (Signed)
Physician Discharge Summary  Patient ID: Erin Bradford MRN: [id number] DOB/AGE: January 19, 1971 46 y.o.  Admit date: 10/06/2017 Discharge date: 10/08/2017  Admission Diagnoses:  Discharge Diagnoses:  Active Problems:   Post-operative state   Discharged Condition: stable  Hospital Course: Pt admitted post TAH/BS. Pts pain and blood pressure were managed along with usual post op care. Pt doing well on post op day #2. Discharged to home in stable condition  Consults: None  Significant Diagnostic Studies: labs: cbc stable  Treatments: IV hydration, analgesia: Dilaudid and percocet and ibuprofen and surgery: TAH/BS  Discharge Exam: Blood pressure (!) 159/90, pulse 82, temperature 98.7 F (37.1 C), temperature source Oral, resp. rate 12, height 5' 6.5" (1.689 m), weight 270 lb (122.5 kg), last menstrual period 09/17/2017, SpO2 96 %. General appearance: alert, cooperative and no distress Chest wall: no tenderness GI: soft, non-tender; bowel sounds normal; no masses,  no organomegaly and incision with dressing c/d/i Extremities: Homans sign is negative, no sign of DVT Pulses: 2+ and symmetric  Disposition: Final discharge disposition not confirmed  Discharge Instructions    Call MD for:  difficulty breathing, headache or visual disturbances    Complete by:  As directed    Call MD for:  persistant dizziness or light-headedness    Complete by:  As directed    Call MD for:  persistant nausea and vomiting    Complete by:  As directed    Call MD for:  redness, tenderness, or signs of infection (pain, swelling, redness, odor or green/yellow discharge around incision site)    Complete by:  As directed    Call MD for:  severe uncontrolled pain    Complete by:  As directed    Call MD for:  temperature >100.4    Complete by:  As directed    Diet - low sodium heart healthy    Complete by:  As directed    Discharge instructions    Complete by:  As directed    Nothing in vagina for 6  weeks.  No sex, tampons, and douching.   Increase activity slowly    Complete by:  As directed      Allergies as of 10/08/2017      Reactions   Ace Inhibitors Anaphylaxis, Swelling   Swelling of tongue.      Medication List    TAKE these medications   buPROPion 150 MG 24 hr tablet Commonly known as:  WELLBUTRIN XL TAKE TWO TABLETS BY MOUTH  DAILY   carvedilol 12.5 MG tablet Commonly known as:  COREG TAKE 1 & 1/2 (ONE & ONE-HALF) TABLETS BY MOUTH TWICE DAILY   cetirizine 10 MG tablet Commonly known as:  ZYRTEC Take 10 mg by mouth at bedtime as needed for allergies.   docusate sodium 100 MG capsule Commonly known as:  COLACE Take 1 capsule (100 mg total) by mouth 2 (two) times daily.   Fluocinolone Acetonide Body 0.01 % Oil Place 2 drops into both ears 2 (two) times daily.   fluticasone 50 MCG/ACT nasal spray Commonly known as:  FLONASE Place into both nostrils as needed for allergies or rhinitis.   gabapentin 300 MG capsule Commonly known as:  NEURONTIN Take 300 mg by mouth 3 (three) times daily.   HYDROcodone-acetaminophen 10-325 MG tablet Commonly known as:  NORCO Take 1 tablet by mouth 3 (three) times daily.   ibuprofen 600 MG tablet Commonly known as:  ADVIL,MOTRIN Take 1 tablet (600 mg total) by mouth every 6 (six) hours  as needed for moderate pain or cramping (mild pain).   LASIX 20 MG tablet Generic drug:  furosemide Take 20 mg by mouth 2 (two) times daily.   losartan 100 MG tablet Commonly known as:  COZAAR Take 100 mg by mouth daily.   oxyCODONE-acetaminophen 5-325 MG tablet Commonly known as:  PERCOCET/ROXICET Take 1-2 tablets by mouth every 4 (four) hours as needed for severe pain (moderate to severe pain (when tolerating fluids)).   primidone 50 MG tablet Commonly known as:  MYSOLINE Take 4 tablets (200 mg total) by mouth as directed. May increase by 50 mg every week as needed up to 300 mg. What changed:  when to take this  additional  instructions   primidone 50 MG tablet Commonly known as:  MYSOLINE Take 50 mg by mouth at bedtime. What changed:  Another medication with the same name was changed. Make sure you understand how and when to take each.   promethazine 25 MG tablet Commonly known as:  PHENERGAN Take 1 tablet (25 mg total) by mouth every 6 (six) hours as needed for nausea or vomiting.   sertraline 50 MG tablet Commonly known as:  ZOLOFT Take 1 tablet by mouth daily.   spironolactone 12.5 mg Tabs tablet Commonly known as:  ALDACTONE Take 12.5 mg by mouth 2 (two) times daily.   tiZANidine 4 MG tablet Commonly known as:  ZANAFLEX Take 4 mg by mouth 2 (two) times daily.   zolpidem 10 MG tablet Commonly known as:  AMBIEN Take 10 mg by mouth at bedtime.      Follow-up Information    Sherlyn Hay, DO. Call in 2 week(s).   Specialty:  Obstetrics and Gynecology Why:  To schedule incision check in two weeks and for post op visit in 6weeks Contact information: [place] [address] [phone]           Signed: [name] 10/08/2017, 11:52 AM

## 2017-10-08 NOTE — Discharge Instructions (Signed)
Nothing in vagina for 6 weeks.  No sex, tampons, and douching.  Other instructions as in [facility] Discharge Booklet. °

## 2017-10-08 NOTE — Progress Notes (Signed)
2 Days Post-Op Procedure(s) (LRB): ATTEMPTED HYSTERECTOMY TOTAL LAPAROSCOPIC WITH SALPINGECTOMY CONVERTED TO OPEN ABDOMINAL TOTAL HYSTERECTOMY WITH SALPINGECTOMY (Bilateral)  Subjective: Patient reports incisional pain, tolerating PO, + flatus and no problems voiding.   Has ambulated with mild dizziness only. Objective: I have reviewed patient's vital signs, intake and output, medications and labs.  General: alert, cooperative and no distress Resp: clear to auscultation bilaterally GI: soft, non-tender; bowel sounds normal; no masses,  no organomegaly and incision: clean, dry and intact Extremities: no edema, redness or tenderness in the calves or thighs  Assessment: s/p Procedure(s): ATTEMPTED HYSTERECTOMY TOTAL LAPAROSCOPIC WITH SALPINGECTOMY CONVERTED TO OPEN ABDOMINAL TOTAL HYSTERECTOMY WITH SALPINGECTOMY (Bilateral): stable  Plan: Advance to PO medication Discharge home later today once good transition to PO meds only complete Discharge instructions reviewed   LOS: 2 days    Erin Bradford Erin Bradford 10/08/2017, 11:46 AM

## 2017-10-09 NOTE — Progress Notes (Signed)
Nurse reviewed discharge instructions with pt.  Pt verbalized understanding of discharge instructions, follow up appointment and new medications.  Prescription given to pt prior to discharge. 

## 2017-10-09 NOTE — Progress Notes (Signed)
Patient ID: Erin Bradford, female   DOB: [birth year], 46 y.o.   MRN: [id number] Pt doing much better today. Pain well controlled on oral medication. Sitting comfortably in chair at bedside. Tolerating PO. Voiding well. NO CP or SOB. Ready for discharge to home today VSS ABD - soft, ND, dressing c/d/i EXT - no homans  A/P: POD#3 s/p TAH/BS - stable         Discharge instructions reiterated         D/C home today

## 2017-10-28 ENCOUNTER — Ambulatory Visit: Payer: Medicaid Other | Admitting: Oncology

## 2017-10-28 ENCOUNTER — Other Ambulatory Visit: Payer: Medicaid Other

## 2017-10-31 ENCOUNTER — Ambulatory Visit: Payer: Medicaid Other | Admitting: Neurology

## 2017-11-15 ENCOUNTER — Telehealth: Payer: Self-pay

## 2017-11-15 NOTE — Telephone Encounter (Signed)
Per patient request to schedule appointment with MD.Shadad. Per 11/20 phone que.

## 2017-11-16 ENCOUNTER — Ambulatory Visit: Payer: Medicaid Other | Admitting: Orthotics

## 2017-11-16 DIAGNOSIS — M76829 Posterior tibial tendinitis, unspecified leg: Secondary | ICD-10-CM

## 2017-11-16 DIAGNOSIS — M779 Enthesopathy, unspecified: Secondary | ICD-10-CM

## 2017-11-16 NOTE — Progress Notes (Signed)
Patient came in today to have her F/O trimmed to fit new shoes; cut about 1/4" off distal of both.

## 2017-11-23 ENCOUNTER — Other Ambulatory Visit (HOSPITAL_BASED_OUTPATIENT_CLINIC_OR_DEPARTMENT_OTHER): Payer: Medicaid Other

## 2017-11-23 ENCOUNTER — Ambulatory Visit (HOSPITAL_BASED_OUTPATIENT_CLINIC_OR_DEPARTMENT_OTHER): Payer: Medicaid Other | Admitting: Oncology

## 2017-11-23 ENCOUNTER — Telehealth: Payer: Self-pay | Admitting: Oncology

## 2017-11-23 VITALS — BP 139/87 | HR 79 | Temp 99.1°F | Resp 18 | Ht 66.5 in | Wt 264.2 lb

## 2017-11-23 DIAGNOSIS — D500 Iron deficiency anemia secondary to blood loss (chronic): Secondary | ICD-10-CM

## 2017-11-23 DIAGNOSIS — N920 Excessive and frequent menstruation with regular cycle: Secondary | ICD-10-CM

## 2017-11-23 LAB — CBC WITH DIFFERENTIAL/PLATELET
BASO%: 0.5 % (ref 0.0–2.0)
BASOS ABS: 0 10*3/uL (ref 0.0–0.1)
EOS ABS: 0.4 10*3/uL (ref 0.0–0.5)
EOS%: 4.3 % (ref 0.0–7.0)
HEMATOCRIT: 32.2 % — AB (ref 34.8–46.6)
HEMOGLOBIN: 9.8 g/dL — AB (ref 11.6–15.9)
LYMPH#: 2.2 10*3/uL (ref 0.9–3.3)
LYMPH%: 26.5 % (ref 14.0–49.7)
MCH: 24 pg — AB (ref 25.1–34.0)
MCHC: 30.4 g/dL — ABNORMAL LOW (ref 31.5–36.0)
MCV: 78.7 fL — AB (ref 79.5–101.0)
MONO#: 0.8 10*3/uL (ref 0.1–0.9)
MONO%: 9.5 % (ref 0.0–14.0)
NEUT%: 59.2 % (ref 38.4–76.8)
NEUTROS ABS: 5 10*3/uL (ref 1.5–6.5)
Platelets: 476 10*3/uL — ABNORMAL HIGH (ref 145–400)
RBC: 4.09 10*6/uL (ref 3.70–5.45)
RDW: 17.1 % — AB (ref 11.2–14.5)
WBC: 8.4 10*3/uL (ref 3.9–10.3)

## 2017-11-23 LAB — IRON AND TIBC
%SAT: 9 % — ABNORMAL LOW (ref 21–57)
Iron: 31 ug/dL — ABNORMAL LOW (ref 41–142)
TIBC: 361 ug/dL (ref 236–444)
UIBC: 330 ug/dL (ref 120–384)

## 2017-11-23 LAB — FERRITIN: Ferritin: 29 ng/ml (ref 9–269)

## 2017-11-23 NOTE — Telephone Encounter (Signed)
Scheduled appt per 11/28 los- Gave patient AVS and calender per los.  

## 2017-11-23 NOTE — Progress Notes (Signed)
Hematology and Oncology Follow Up Visit  TIANNE PLOTT [id number] January 29, 1971 46 y.o. 11/23/2017 11:08 AM Bartholome Bill, MDBoyd, Dola Factor, MD   Principle Diagnosis: 46 year old with iron deficiency anemia related to chronic blood losses and menorrhagia diagnosed in February 2018. She presented with hemoglobin 11, iron of 31 and saturation of 8%.   Prior Therapy:  She status post Feraheme infusion for a total of 1000 mg completed in February 2018. She is status post hysterectomy completed on 10/06/2017.  Current therapy: Intravenous iron as needed.  Interim History: Ms. Feliciano presents today for a follow-up visit. Since her last visit, she underwent a hysterectomy on October 06, 2017.  She tolerated the procedure well but she continues to have wound healing issues at this time.  She has been diagnosed with prediabetic state.  She continues to have fatigue and tiredness at this time.  She denies any hematochezia or melena.  She is intolerant to oral iron with issues of constipation and dyspepsia.  .  She does not report any blurry vision, syncope or seizures. She does not report any fevers, chills, sweats or weight loss. She does not report any chest pain, palpitation, orthopnea or leg edema. She does not report any cough, wheezing or hemoptysis. She does not report any nausea, vomiting or abdominal pain. She does not report any frequency urgency or hesitancy. She does not report any skeletal complaints. Remaining review of systems unremarkable.   Medications: I have reviewed the patient's current medications.  Current Outpatient Medications  Medication Sig Dispense Refill  . buPROPion (WELLBUTRIN XL) 150 MG 24 hr tablet TAKE TWO TABLETS BY MOUTH  DAILY    . carvedilol (COREG) 12.5 MG tablet TAKE 1 & 1/2 (ONE & ONE-HALF) TABLETS BY MOUTH TWICE DAILY 270 tablet 2  . cetirizine (ZYRTEC) 10 MG tablet Take 10 mg by mouth at bedtime as needed for allergies.     Marland Kitchen docusate sodium  (COLACE) 100 MG capsule Take 1 capsule (100 mg total) by mouth 2 (two) times daily. 20 capsule 0  . Fluocinolone Acetonide Body 0.01 % OIL Place 2 drops into both ears 2 (two) times daily.     . fluticasone (FLONASE) 50 MCG/ACT nasal spray Place into both nostrils as needed for allergies or rhinitis.    . furosemide (LASIX) 20 MG tablet Take 20 mg by mouth 2 (two) times daily.     Marland Kitchen gabapentin (NEURONTIN) 300 MG capsule Take 300 mg by mouth 3 (three) times daily.     Marland Kitchen HYDROcodone-acetaminophen (NORCO) 10-325 MG tablet Take 1 tablet by mouth 3 (three) times daily.     Marland Kitchen ibuprofen (ADVIL,MOTRIN) 600 MG tablet Take 1 tablet (600 mg total) by mouth every 6 (six) hours as needed for moderate pain or cramping (mild pain). 40 tablet 2  . losartan (COZAAR) 100 MG tablet Take 100 mg by mouth daily.    Marland Kitchen oxyCODONE-acetaminophen (PERCOCET/ROXICET) 5-325 MG tablet Take 1-2 tablets by mouth every 4 (four) hours as needed for severe pain (moderate to severe pain (when tolerating fluids)). 30 tablet 0  . primidone (MYSOLINE) 50 MG tablet Take 4 tablets (200 mg total) by mouth as directed. May increase by 50 mg every week as needed up to 300 mg. (Patient taking differently: Take 200 mg by mouth at bedtime. May increase by 50 mg every week as needed up to 300 mg.) 180 tablet 1  . primidone (MYSOLINE) 50 MG tablet Take 50 mg by mouth at bedtime.     Marland Kitchen  promethazine (PHENERGAN) 25 MG tablet Take 1 tablet (25 mg total) by mouth every 6 (six) hours as needed for nausea or vomiting. 30 tablet 0  . sertraline (ZOLOFT) 50 MG tablet Take 1 tablet by mouth daily.    Marland Kitchen spironolactone (ALDACTONE) 12.5 mg TABS tablet Take 12.5 mg by mouth 2 (two) times daily.    Marland Kitchen tiZANidine (ZANAFLEX) 4 MG tablet Take 4 mg by mouth 2 (two) times daily.     Marland Kitchen zolpidem (AMBIEN) 10 MG tablet Take 10 mg by mouth at bedtime.   1   No current facility-administered medications for this visit.      Allergies:  Allergies  Allergen Reactions  .  Ace Inhibitors Anaphylaxis and Swelling    Swelling of tongue.    Past Medical History, Surgical history, Social history, and Family History were reviewed and updated.   Physical Exam: Blood pressure 139/87, pulse 79, temperature 99.1 F (37.3 C), temperature source Oral, resp. rate 18, height 5' 6.5" (1.689 m), weight 264 lb 3.2 oz (119.8 kg), SpO2 95 %. ECOG:0  General appearance: Alert, awake woman without distress. Head: Normocephalic, without obvious abnormality no oral thrush or ulcers. Neck: no adenopathy Lymph nodes: Cervical, supraclavicular, and axillary nodes normal. Heart:regular rate and rhythm, S1, S2 normal, no murmur, click, rub or gallop Lung:chest clear, no wheezing, rales, normal symmetric air entry. Abdomin: soft, non-tender, without masses or organomegaly. No shifting dullness or ascites. EXT:no erythema, induration, or nodules   Lab Results: Lab Results  Component Value Date   WBC 8.4 11/23/2017   HGB 9.8 (L) 11/23/2017   HCT 32.2 (L) 11/23/2017   MCV 78.7 (L) 11/23/2017   PLT 476 (H) 11/23/2017     Chemistry      Component Value Date/Time   NA 138 09/28/2017 1409   NA 138 02/04/2017 1438   K 3.7 09/28/2017 1409   K 3.8 02/04/2017 1438   CL 104 09/28/2017 1409   CO2 26 09/28/2017 1409   CO2 26 02/04/2017 1438   BUN 8 09/28/2017 1409   BUN 8.9 02/04/2017 1438   CREATININE 0.92 09/28/2017 1409   CREATININE 1.0 02/04/2017 1438      Component Value Date/Time   CALCIUM 9.0 09/28/2017 1409   CALCIUM 9.2 02/04/2017 1438   ALKPHOS 90 02/04/2017 1438   AST 17 02/04/2017 1438   ALT 20 02/04/2017 1438   BILITOT <0.22 02/04/2017 1438          Impression and Plan:  46 year old woman with the following issues:  1. Iron deficiency anemia: Her diagnosis has been chronic in nature and related to menstrual blood losses. She has menorrhagia related to uterine fibroids and have been on oral iron replacement but has been unsuccessful. Her iron level in  February 2018 was 31 with saturation of 8%. Her ferritin was 31.  She received IV iron in February 2018 and tolerated it well.  This was repeated in August 3570 without complications.  Her hemoglobin today continues to be low with symptoms of fatigue and iron deficiency.  Risks and benefits of treating her with Shirlean Kelly was discussed today she is agreeable to receive that.   2. Menorrhagia: Related to uterine fibroids.  She is status post hysterectomy which will help her symptoms moving forward.  3. Colon cancer screening: She does not have any GI symptoms but she is of appropriate age to receive colon cancer screening. I do not think her iron deficiency is related to GI blood losses but it is appropriate  to consider cancer screening in her.  4. Follow-up: Will be in 3 months to repeat iron studies.   Zola Button, MD 11/28/201811:08 AM

## 2017-11-24 ENCOUNTER — Ambulatory Visit (HOSPITAL_COMMUNITY)
Admission: RE | Admit: 2017-11-24 | Discharge: 2017-11-24 | Disposition: A | Discharge status: Home / Self Care | Payer: Medicaid Other | Source: Ambulatory Visit | Attending: Obstetrics and Gynecology | Admitting: Obstetrics and Gynecology

## 2017-11-24 ENCOUNTER — Emergency Department (HOSPITAL_COMMUNITY)
Admission: EM | Admit: 2017-11-24 | Discharge: 2017-11-24 | Discharge status: Absent without leave | Payer: Medicaid Other | Attending: Obstetrics and Gynecology | Admitting: Obstetrics and Gynecology

## 2017-11-24 ENCOUNTER — Encounter (HOSPITAL_COMMUNITY): Payer: Self-pay | Admitting: Emergency Medicine

## 2017-11-24 DIAGNOSIS — R1030 Lower abdominal pain, unspecified: Secondary | ICD-10-CM

## 2017-11-24 DIAGNOSIS — E279 Disorder of adrenal gland, unspecified: Secondary | ICD-10-CM | POA: Insufficient documentation

## 2017-11-24 LAB — URINALYSIS, ROUTINE W REFLEX MICROSCOPIC
BACTERIA UA: NONE SEEN
Bilirubin Urine: NEGATIVE
Glucose, UA: NEGATIVE mg/dL
Hgb urine dipstick: NEGATIVE
Ketones, ur: NEGATIVE mg/dL
Nitrite: NEGATIVE
PH: 6 (ref 5.0–8.0)
PROTEIN: NEGATIVE mg/dL
Specific Gravity, Urine: 1.026 (ref 1.005–1.030)

## 2017-11-24 LAB — COMPREHENSIVE METABOLIC PANEL
ALT: 10 U/L — ABNORMAL LOW (ref 14–54)
AST: 52 U/L — AB (ref 15–41)
Albumin: 3.8 g/dL (ref 3.5–5.0)
Alkaline Phosphatase: 108 U/L (ref 38–126)
Anion gap: 8 (ref 5–15)
BUN: 7 mg/dL (ref 6–20)
CHLORIDE: 102 mmol/L (ref 101–111)
CO2: 28 mmol/L (ref 22–32)
CREATININE: 1.21 mg/dL — AB (ref 0.44–1.00)
Calcium: 9.3 mg/dL (ref 8.9–10.3)
GFR calc non Af Amer: 52 mL/min — ABNORMAL LOW (ref >60)
Glucose, Bld: 102 mg/dL — ABNORMAL HIGH (ref 65–99)
Potassium: 4.9 mmol/L (ref 3.5–5.1)
SODIUM: 138 mmol/L (ref 135–145)
Total Bilirubin: 1.5 mg/dL — ABNORMAL HIGH (ref 0.3–1.2)
Total Protein: 6.9 g/dL (ref 6.5–8.1)

## 2017-11-24 LAB — CBC
HEMATOCRIT: 31.9 % — AB (ref 36.0–46.0)
Hemoglobin: 9.8 g/dL — ABNORMAL LOW (ref 12.0–15.0)
MCH: 24.1 pg — AB (ref 26.0–34.0)
MCHC: 30.7 g/dL (ref 30.0–36.0)
MCV: 78.4 fL (ref 78.0–100.0)
PLATELETS: 544 10*3/uL — AB (ref 150–400)
RBC: 4.07 MIL/uL (ref 3.87–5.11)
RDW: 17 % — ABNORMAL HIGH (ref 11.5–15.5)
WBC: 10.1 10*3/uL (ref 4.0–10.5)

## 2017-11-24 LAB — LIPASE, BLOOD: LIPASE: 17 U/L (ref 11–51)

## 2017-11-24 MED ORDER — IOPAMIDOL (ISOVUE-300) INJECTION 61%
100.0000 mL | Freq: Once | INTRAVENOUS | Status: AC | PRN
  Administered 2017-11-24: 100 mL via INTRAVENOUS

## 2017-11-24 NOTE — ED Triage Notes (Addendum)
Patient reports she was sent by [facility] for further evaluation and CT for lower abdominal pain and diarrhea x10 days. Denies N/V. Reports she had a hysterectomy on 10/11.

## 2017-11-28 DIAGNOSIS — R7303 Prediabetes: Secondary | ICD-10-CM | POA: Insufficient documentation

## 2017-11-29 ENCOUNTER — Encounter: Payer: Self-pay | Admitting: Oncology

## 2017-12-01 ENCOUNTER — Encounter: Payer: Self-pay | Admitting: Oncology

## 2017-12-02 ENCOUNTER — Ambulatory Visit (HOSPITAL_BASED_OUTPATIENT_CLINIC_OR_DEPARTMENT_OTHER): Payer: Medicaid Other

## 2017-12-02 ENCOUNTER — Telehealth: Payer: Self-pay | Admitting: *Deleted

## 2017-12-02 VITALS — BP 140/83 | HR 62 | Temp 98.9°F | Resp 18 | Wt 264.0 lb

## 2017-12-02 DIAGNOSIS — N920 Excessive and frequent menstruation with regular cycle: Secondary | ICD-10-CM

## 2017-12-02 DIAGNOSIS — D500 Iron deficiency anemia secondary to blood loss (chronic): Secondary | ICD-10-CM

## 2017-12-02 MED ORDER — SODIUM CHLORIDE 0.9 % IV SOLN
510.0000 mg | Freq: Once | INTRAVENOUS | Status: AC
  Administered 2017-12-02: 510 mg via INTRAVENOUS
  Filled 2017-12-02: qty 17

## 2017-12-02 MED ORDER — SODIUM CHLORIDE 0.9 % IV SOLN
Freq: Once | INTRAVENOUS | Status: AC
  Administered 2017-12-02: 13:00:00 via INTRAVENOUS

## 2017-12-02 NOTE — Patient Instructions (Signed)
Ferumoxytol injection [facility]) What is this medicine? FERUMOXYTOL is an iron complex. Iron is used to make healthy red blood cells, which carry oxygen and nutrients throughout the body. This medicine is used to treat iron deficiency anemia in people with chronic kidney disease. COMMON BRAND NAME(S): Feraheme What should I tell my health care provider before I take this medicine? They need to know if you have any of these conditions: -anemia not caused by low iron levels -high levels of iron in the blood -magnetic resonance imaging (MRI) test scheduled -an unusual or allergic reaction to iron, other medicines, foods, dyes, or preservatives -pregnant or trying to get pregnant -breast-feeding How should I use this medicine? This medicine is for injection into a vein. It is given by a health care professional in a hospital or clinic setting. Talk to your pediatrician regarding the use of this medicine in children. Special care may be needed. What if I miss a dose? It is important not to miss your dose. Call your doctor or health care professional if you are unable to keep an appointment. What may interact with this medicine? This medicine may interact with the following medications: -other iron products What should I watch for while using this medicine? Visit your doctor or healthcare professional regularly. Tell your doctor or healthcare professional if your symptoms do not start to get better or if they get worse. You may need blood work done while you are taking this medicine. You may need to follow a special diet. Talk to your doctor. Foods that contain iron include: whole grains/cereals, dried fruits, beans, or peas, leafy green vegetables, and organ meats (liver, kidney). What side effects may I notice from receiving this medicine? Side effects that you should report to your doctor or health care professional as soon as possible: -allergic reactions like skin rash, itching or hives,  swelling of the face, lips, or tongue -breathing problems -changes in blood pressure -feeling faint or lightheaded, falls -fever or chills -flushing, sweating, or hot feelings -swelling of the ankles or feet Side effects that usually do not require medical attention (report to your doctor or health care professional if they continue or are bothersome): -diarrhea -headache -nausea, vomiting -stomach pain Where should I keep my medicine? This drug is given in a hospital or clinic and will not be stored at home.  2017 Elsevier/Gold Standard (2016-01-15 12:41:49)

## 2017-12-02 NOTE — Telephone Encounter (Signed)
Patient would like to re-schedule her 12/14 appt to a different day. LOS to schedulers

## 2017-12-05 ENCOUNTER — Ambulatory Visit: Payer: Medicaid Other | Admitting: Neurology

## 2017-12-05 ENCOUNTER — Encounter: Payer: Self-pay | Admitting: Oncology

## 2017-12-06 ENCOUNTER — Telehealth: Payer: Self-pay | Admitting: Oncology

## 2017-12-06 NOTE — Telephone Encounter (Signed)
Spoke with patient regarding rescheduling 12/14 appt.  She is now scheduled for 12/17

## 2017-12-08 ENCOUNTER — Encounter: Payer: Self-pay | Admitting: Gastroenterology

## 2017-12-09 ENCOUNTER — Ambulatory Visit: Payer: Medicaid Other

## 2017-12-12 ENCOUNTER — Ambulatory Visit: Payer: Medicaid Other

## 2017-12-16 ENCOUNTER — Telehealth: Payer: Self-pay | Admitting: Neurology

## 2017-12-16 ENCOUNTER — Other Ambulatory Visit: Payer: Medicaid Other

## 2017-12-16 ENCOUNTER — Encounter: Payer: Self-pay | Admitting: Neurology

## 2017-12-16 ENCOUNTER — Ambulatory Visit (INDEPENDENT_AMBULATORY_CARE_PROVIDER_SITE_OTHER): Payer: Medicaid Other | Admitting: Neurology

## 2017-12-16 VITALS — BP 142/84 | HR 85 | Ht 66.5 in | Wt 265.0 lb

## 2017-12-16 DIAGNOSIS — G250 Essential tremor: Secondary | ICD-10-CM | POA: Diagnosis not present

## 2017-12-16 DIAGNOSIS — R413 Other amnesia: Secondary | ICD-10-CM | POA: Diagnosis not present

## 2017-12-16 MED ORDER — PRIMIDONE 50 MG PO TABS: 300.0000 mg | ORAL_TABLET | Freq: Every day | ORAL | 2 refills | Status: DC

## 2017-12-16 NOTE — Patient Instructions (Signed)
1.  Continue primidone 300mg  at bedtime 2.  For memory, I think it is stress-related.  But we will check TSH and B12, which may affect memory 3.  Follow up in 5 months.

## 2017-12-16 NOTE — Progress Notes (Signed)
NEUROLOGY FOLLOW UP OFFICE NOTE  Erin Bradford [id number]  HISTORY OF PRESENT ILLNESS: Erin Bradford is a 46 year old right-handed female with hypertension, iron-deficiency anemia, prediabetes, mild chronic kidney disease, palpitations, anxiety who follows up for essential tremor.   UPDATE: She is taking primidone 300mg  at bedtime.  Tremors are much better controlled.  She wanted to discuss short term memory problems.  She frequently is experiencing word-finding difficulties.  This has been ongoing for about a year but has gotten worse over the past 3 months.  Other than the primidone, she has not had any changes or new medications.  She has not had trouble with finances or disorientation on familiar routes.     HISTORY: Beginning in 2017, she reports tremor in both hands.  She only notices it when she is performing a task, but not at rest.  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  She also reports difficulty pouring liquids in a spoon or using utensils.  It does not affect writing.  She denies any triggers or anything that improves symptoms.  She thinks it has gotten a little worse over the past year.  She denies starting any new medication prior to onset of symptoms.  She denies family history of tremor.  TSH was 0.92.    PAST MEDICAL HISTORY: Past Medical History:  Diagnosis Date  . Abnormal laboratory test   . Anemia   . Anxiety   . BMI 39.0-39.9,adult   . Chronic hypokalemia   . Depression   . Hypertension   . Insomnia   . Low back pain   . Obesity   . Occasional tremors   . Palpitations    dx with tachycardia   . PONV (postoperative nausea and vomiting)   . Tiredness     MEDICATIONS: Current Outpatient Medications on File Prior to Visit  Medication Sig Dispense Refill  . amoxicillin-clavulanate (AUGMENTIN) 875-125 MG tablet Take 1 tablet by mouth 2 (two) times daily.    Marland Kitchen buPROPion (WELLBUTRIN XL) 150 MG 24 hr tablet TAKE TWO TABLETS  BY MOUTH  DAILY    . carvedilol (COREG) 12.5 MG tablet TAKE 1 & 1/2 (ONE & ONE-HALF) TABLETS BY MOUTH TWICE DAILY 270 tablet 2  . cetirizine (ZYRTEC) 10 MG tablet Take 10 mg by mouth at bedtime as needed for allergies.     Marland Kitchen docusate sodium (COLACE) 100 MG capsule Take 1 capsule (100 mg total) by mouth 2 (two) times daily. 20 capsule 0  . Fluocinolone Acetonide Body 0.01 % OIL Place 2 drops into both ears 2 (two) times daily.     . fluticasone (FLONASE) 50 MCG/ACT nasal spray Place into both nostrils as needed for allergies or rhinitis.    . furosemide (LASIX) 20 MG tablet Take 20 mg by mouth 2 (two) times daily.     Marland Kitchen gabapentin (NEURONTIN) 300 MG capsule Take 300 mg by mouth 3 (three) times daily.     Marland Kitchen HYDROcodone-acetaminophen (NORCO) 10-325 MG tablet Take 1 tablet by mouth 3 (three) times daily.     Marland Kitchen ibuprofen (ADVIL,MOTRIN) 600 MG tablet Take 1 tablet (600 mg total) by mouth every 6 (six) hours as needed for moderate pain or cramping (mild pain). 40 tablet 2  . losartan (COZAAR) 100 MG tablet Take 100 mg by mouth daily.    Marland Kitchen oxyCODONE-acetaminophen (PERCOCET/ROXICET) 5-325 MG tablet Take 1-2 tablets by mouth every 4 (four) hours as needed for severe pain (moderate to severe  pain (when tolerating fluids)). 30 tablet 0  . primidone (MYSOLINE) 50 MG tablet Take 4 tablets (200 mg total) by mouth as directed. May increase by 50 mg every week as needed up to 300 mg. (Patient taking differently: Take 200 mg by mouth at bedtime. May increase by 50 mg every week as needed up to 300 mg.) 180 tablet 1  . primidone (MYSOLINE) 50 MG tablet Take 50 mg by mouth at bedtime.     . promethazine (PHENERGAN) 25 MG tablet Take 1 tablet (25 mg total) by mouth every 6 (six) hours as needed for nausea or vomiting. 30 tablet 0  . sertraline (ZOLOFT) 50 MG tablet Take 1 tablet by mouth daily.    Marland Kitchen spironolactone (ALDACTONE) 12.5 mg TABS tablet Take 12.5 mg by mouth 2 (two) times daily.    Marland Kitchen tiZANidine (ZANAFLEX) 4 MG  tablet Take 4 mg by mouth 2 (two) times daily.     Marland Kitchen zolpidem (AMBIEN) 10 MG tablet Take 10 mg by mouth at bedtime.   1   No current facility-administered medications on file prior to visit.     ALLERGIES: Allergies  Allergen Reactions  . Ace Inhibitors Anaphylaxis and Swelling    Swelling of tongue.    FAMILY HISTORY: Family History  Problem Relation Age of Onset  . Diabetes Mother   . Hypertension Mother   . Hyperlipidemia Mother   . Sarcoidosis Mother   . Kidney disease Father   . Atrial fibrillation Sister   . Kidney disease Maternal Grandmother   . Pancreatic cancer Maternal Grandmother   . Diabetes Maternal Grandmother   . Hypertension Maternal Grandmother   . Cancer Maternal Grandfather     SOCIAL HISTORY: Social History   Socioeconomic History  . Marital status: Widowed    Spouse name: Not on file  . Number of children: Not on file  . Years of education: Not on file  . Highest education level: Not on file  Social Needs  . Financial resource strain: Not on file  . Food insecurity - worry: Not on file  . Food insecurity - inability: Not on file  . Transportation needs - medical: Not on file  . Transportation needs - non-medical: Not on file  Occupational History  . Not on file  Tobacco Use  . Smoking status: Never Smoker  . Smokeless tobacco: Never Used  Substance and Sexual Activity  . Alcohol use: No  . Drug use: No  . Sexual activity: No    Birth control/protection: Condom  Other Topics Concern  . Not on file  Social History Narrative  . Not on file    REVIEW OF SYSTEMS: Constitutional: No fevers, chills, or sweats, no generalized fatigue, change in appetite Eyes: No visual changes, double vision, eye pain Ear, nose and throat: No hearing loss, ear pain, nasal congestion, sore throat Cardiovascular: No chest pain, palpitations Respiratory:  No shortness of breath at rest or with exertion, wheezes GastrointestinaI: No nausea, vomiting,  diarrhea, abdominal pain, fecal incontinence Genitourinary:  No dysuria, urinary retention or frequency Musculoskeletal:  No neck pain, back pain Integumentary: No rash, pruritus, skin lesions Neurological: as above Psychiatric: No depression, insomnia, anxiety Endocrine: No palpitations, fatigue, diaphoresis, mood swings, change in appetite, change in weight, increased thirst Hematologic/Lymphatic:  No purpura, petechiae. Allergic/Immunologic: no itchy/runny eyes, nasal congestion, recent allergic reactions, rashes  PHYSICAL EXAM: Vitals:   12/16/17 1028  BP: (!) 142/84  Pulse: 85  SpO2: 97%   General: She became tearful  when she struggled with the [facility].Marland Kitchen  Patient appears well-groomed.   Head:  Normocephalic/atraumatic Eyes:  Fundi examined but not visualized Neck: supple, no paraspinal tenderness, full range of motion Heart:  Regular rate and rhythm Lungs:  Clear to auscultation bilaterally Back: No paraspinal tenderness Neurological Exam: alert and oriented to person, place, and time. Attention span and concentration intact, delayed recall poor, remote memory intact, fund of knowledge intact.  Speech fluent and not dysarthric, language intact.  She couldn't correctly copy a cube.  She struggled with drawing a clock but was able to complete it correctly.  Trail Making Test was correct. Montreal Cognitive Assessment  12/16/2017  Visuospatial/ Executive (0/5) 4  Naming (0/3) 3  Attention: Read list of digits (0/2) 2  Attention: Read list of letters (0/1) 1  Attention: Serial 7 subtraction starting at 100 (0/3) 3  Language: Repeat phrase (0/2) 2  Language : Fluency (0/1) 1  Abstraction (0/2) 2  Delayed Recall (0/5) 0  Orientation (0/6) 6  Total 24  Adjusted Score (based on education) 25   CN II-XII intact. Bulk and tone normal, muscle strength 5/5 throughout.  Sensation to light touch  intact.  Deep tendon reflexes 2+ throughout.  Finger to nose testing intact.  Gait normal,  Romberg negative.   IMPRESSION: Essential tremor, stable Memory deficits.  Suspect stress-related.  PLAN: 1.  Continue primidone 300mg  at bedtime 2.  To assess memory, will check B12 level.  I explained that I think anxiety is likely contributing to the memory issues.  However, we can consider neurocognitive testing if desired. 3.  Follow up in 5 months.  25 minutes spent face to face with patient, over 50% spent discussing management.  Metta Clines, DO  CC:  Precious Haws, MD

## 2017-12-16 NOTE — Telephone Encounter (Signed)
Patient forgot to let Dr. Tomi Likens know that she needs a refill on her Primidone medication. She is completely out. Thanks

## 2017-12-16 NOTE — Telephone Encounter (Signed)
Sent in refill

## 2017-12-17 ENCOUNTER — Ambulatory Visit (HOSPITAL_BASED_OUTPATIENT_CLINIC_OR_DEPARTMENT_OTHER): Payer: Medicaid Other

## 2017-12-17 VITALS — BP 159/70 | HR 65 | Temp 98.5°F | Resp 16

## 2017-12-17 DIAGNOSIS — D500 Iron deficiency anemia secondary to blood loss (chronic): Secondary | ICD-10-CM

## 2017-12-17 DIAGNOSIS — N920 Excessive and frequent menstruation with regular cycle: Secondary | ICD-10-CM | POA: Diagnosis not present

## 2017-12-17 LAB — VITAMIN B12: VITAMIN B 12: 663 pg/mL (ref 200–1100)

## 2017-12-17 LAB — TSH: TSH: 1.05 mIU/L

## 2017-12-17 MED ORDER — SODIUM CHLORIDE 0.9 % IV SOLN
Freq: Once | INTRAVENOUS | Status: AC
  Administered 2017-12-17: 11:00:00 via INTRAVENOUS

## 2017-12-17 MED ORDER — SODIUM CHLORIDE 0.9 % IV SOLN
510.0000 mg | Freq: Once | INTRAVENOUS | Status: AC
  Administered 2017-12-17: 510 mg via INTRAVENOUS
  Filled 2017-12-17: qty 17

## 2017-12-17 NOTE — Patient Instructions (Signed)

## 2017-12-22 ENCOUNTER — Telehealth: Payer: Self-pay

## 2017-12-22 NOTE — Telephone Encounter (Signed)
-----   Message from Pieter Partridge, DO sent at 12/17/2017  1:38 PM EST ----- Labs are normal

## 2017-12-22 NOTE — Telephone Encounter (Signed)
Called and spoke with Pt, advsd her of lab results

## 2018-01-10 ENCOUNTER — Ambulatory Visit: Payer: Medicaid Other | Admitting: Gastroenterology

## 2018-01-10 ENCOUNTER — Encounter: Payer: Self-pay | Admitting: Gastroenterology

## 2018-01-10 VITALS — BP 114/70 | HR 76 | Ht 66.5 in | Wt 273.1 lb

## 2018-01-10 DIAGNOSIS — R197 Diarrhea, unspecified: Secondary | ICD-10-CM | POA: Diagnosis not present

## 2018-01-10 DIAGNOSIS — D509 Iron deficiency anemia, unspecified: Secondary | ICD-10-CM

## 2018-01-10 MED ORDER — PEG 3350-KCL-NA BICARB-NACL 420 G PO SOLR: 4000.0000 mL | Freq: Once | ORAL | 0 refills | Status: AC

## 2018-01-10 NOTE — Patient Instructions (Addendum)
Call here if you think you have C. Diff again. Will likely send stool testing and start you on long tapering vancomycin course if that occurs.  You will be set up for a colonoscopy for iron def anemia.  Normal BMI (Body Mass Index- based on height and weight) is between 19 and 25. Your BMI today is Body mass index is 43.42 kg/m. Marland Kitchen Please consider follow up  regarding your BMI with your Primary Care Provider.

## 2018-01-10 NOTE — Progress Notes (Signed)
HPI: This is a very pleasant 47 year old woman who was referred by Crawford Givens, MD for diarrhea  Chief complaint is iron deficiency anemia, diarrhea  She had severe iron deficiency anemia from very heavy periods that were the result of fibroid uterus.  She ended up eventually getting total abdominal hysterectomy in October 2018.  This was complicated by a suspected infected hematoma.  It took quite a while for the incision to heal.  She was on 3 courses of antibiotics over that time period.  She began to have terrible watery diarrhea that would wake her up from sleep, occurring 20 times a day at its worst.  Her gynecologist started her empirically on metronidazole with good relief of her symptoms but about 2 weeks after she completed that 10-day course should the diarrhea recurred.  She was then put on a 10-day course of oral vancomycin 4 times daily 125 mg.  It has been about 2 weeks since she completed the vancomycin and her stools are nearly back to normal.  When she was having a terrible diarrhea she felt exhausted, nauseous, but really overall terrible.  She was having abdominal pains.  She was never tested for Clostridium difficile.    Old Data Reviewed: I reviewed a note from her gynecologist dated December 08, 2017 in which she wrote about diarrhea.  It looks like she had been on 3 courses of antibiotics following total abdominal hysterectomy due to suspected infected hematoma.  She completed a course of Flagyl with good relief of her diarrhea but it came back and then she was prescribed oral vancomycin 125 mg pills 1 pill 4 times daily for 10 days.    Review of systems: Pertinent positive and negative review of systems were noted in the above HPI section. All other review negative.   Past Medical History:  Diagnosis Date  . Abnormal laboratory test   . Anemia   . Anxiety   . BMI 39.0-39.9,adult   . Chronic hypokalemia   . Depression   . Hypertension   . Insomnia   . Low back  pain   . Obesity   . Occasional tremors   . Palpitations    dx with tachycardia   . PONV (postoperative nausea and vomiting)   . Tiredness     Past Surgical History:  Procedure Laterality Date  . CESAREAN SECTION    . TOOTH EXTRACTION    . TOTAL LAPAROSCOPIC HYSTERECTOMY WITH SALPINGECTOMY Bilateral 10/06/2017   Procedure: ATTEMPTED HYSTERECTOMY TOTAL LAPAROSCOPIC WITH SALPINGECTOMY CONVERTED TO OPEN ABDOMINAL TOTAL HYSTERECTOMY WITH SALPINGECTOMY;  Surgeon: Sherlyn Hay, DO;  Location: [facility];  Service: Gynecology;  Laterality: Bilateral;    Current Outpatient Medications  Medication Sig Dispense Refill  . buPROPion (WELLBUTRIN XL) 300 MG 24 hr tablet Take 300 mg by mouth daily.    . carvedilol (COREG) 12.5 MG tablet TAKE 1 & 1/2 (ONE & ONE-HALF) TABLETS BY MOUTH TWICE DAILY 270 tablet 2  . cetirizine (ZYRTEC) 10 MG tablet Take 10 mg by mouth at bedtime as needed for allergies.     . Fluocinolone Acetonide Body 0.01 % OIL Place 2 drops into both ears 2 (two) times daily.     . furosemide (LASIX) 20 MG tablet Take 20 mg by mouth 2 (two) times daily.     Marland Kitchen gabapentin (NEURONTIN) 300 MG capsule Take 300 mg by mouth 3 (three) times daily.     Marland Kitchen HYDROcodone-acetaminophen (NORCO) 10-325 MG tablet Take 1 tablet by mouth 4 (  four) times daily.     Marland Kitchen losartan (COZAAR) 100 MG tablet Take 100 mg by mouth daily.    . primidone (MYSOLINE) 50 MG tablet Take 4 tablets (200 mg total) by mouth as directed. May increase by 50 mg every week as needed up to 300 mg. (Patient taking differently: Take 200 mg by mouth 4 (four) times daily. May increase by 50 mg every week as needed up to 300 mg.) 180 tablet 1  . primidone (MYSOLINE) 50 MG tablet Take 50 mg by mouth at bedtime.     . primidone (MYSOLINE) 50 MG tablet Take 6 tablets (300 mg total) by mouth at bedtime. 180 tablet 2  . promethazine (PHENERGAN) 25 MG tablet Take 1 tablet (25 mg total) by mouth every 6 (six) hours as  needed for nausea or vomiting. 30 tablet 0  . sertraline (ZOLOFT) 50 MG tablet Take 1 tablet by mouth daily.    Marland Kitchen spironolactone (ALDACTONE) 12.5 mg TABS tablet Take 12.5 mg by mouth 2 (two) times daily.    Marland Kitchen tiZANidine (ZANAFLEX) 4 MG tablet Take 4 mg by mouth 2 (two) times daily.     Marland Kitchen zolpidem (AMBIEN) 10 MG tablet Take 10 mg by mouth at bedtime.   1   No current facility-administered medications for this visit.     Allergies as of 01/10/2018 - Review Complete 01/10/2018  Allergen Reaction Noted  . Ace inhibitors Anaphylaxis and Swelling 12/01/2016    Family History  Problem Relation Age of Onset  . Diabetes Mother   . Hypertension Mother   . Hyperlipidemia Mother   . Sarcoidosis Mother   . Kidney disease Father   . Atrial fibrillation Sister   . Kidney disease Maternal Grandmother   . Pancreatic cancer Maternal Grandmother   . Diabetes Maternal Grandmother   . Hypertension Maternal Grandmother   . Cancer Maternal Grandfather     Social History   Socioeconomic History  . Marital status: Widowed    Spouse name: Not on file  . Number of children: Not on file  . Years of education: Not on file  . Highest education level: Not on file  Social Needs  . Financial resource strain: Not on file  . Food insecurity - worry: Not on file  . Food insecurity - inability: Not on file  . Transportation needs - medical: Not on file  . Transportation needs - non-medical: Not on file  Occupational History  . Not on file  Tobacco Use  . Smoking status: Never Smoker  . Smokeless tobacco: Never Used  Substance and Sexual Activity  . Alcohol use: No  . Drug use: No  . Sexual activity: No    Birth control/protection: Condom  Other Topics Concern  . Not on file  Social History Narrative  . Not on file     Physical Exam: Ht 5' 6.5" (1.689 m)   Wt 273 lb 2 oz (123.9 kg)   LMP 09/17/2017 (Approximate)   BMI 43.42 kg/m  Constitutional: generally well-appearing Psychiatric:  alert and oriented x3 Eyes: extraocular movements intact Mouth: oral pharynx moist, no lesions Neck: supple no lymphadenopathy Cardiovascular: heart regular rate and rhythm Lungs: clear to auscultation bilaterally Abdomen: soft, nontender, nondistended, no obvious ascites, no peritoneal signs, normal bowel sounds Extremities: no lower extremity edema bilaterally Skin: no lesions on visible extremities   Assessment and plan: 47 y.o. female with iron deficiency anemia, diarrhea  First I do think it was very likely that she had antibiotic associated  diarrhea with Clostridium difficile.  She knows to call here if she thinks the diarrhea, C. difficile has recurred.  If that happens I would like to have stool testing checked and after she drops off the stool I would start her on a long tapering course of vancomycin.  I also recommended a colonoscopy to exclude a significant colon neoplasm as a cause of her iron deficiency anemia.  I think it is unlikely especially since she was having such heavy periods.    Please see the "Patient Instructions" section for addition details about the plan.   Owens Loffler, MD [facility] 01/10/2018, 2:28 PM  Cc: Bartholome Bill, MD

## 2018-01-16 ENCOUNTER — Other Ambulatory Visit: Payer: Self-pay | Admitting: Family Medicine

## 2018-01-16 ENCOUNTER — Ambulatory Visit
Admission: RE | Admit: 2018-01-16 | Discharge: 2018-01-16 | Disposition: A | Discharge status: Home / Self Care | Payer: Medicaid Other | Source: Ambulatory Visit | Attending: Family Medicine | Admitting: Family Medicine

## 2018-01-16 DIAGNOSIS — M25561 Pain in right knee: Principal | ICD-10-CM

## 2018-01-16 DIAGNOSIS — G8929 Other chronic pain: Secondary | ICD-10-CM

## 2018-01-16 DIAGNOSIS — M25562 Pain in left knee: Principal | ICD-10-CM

## 2018-02-13 ENCOUNTER — Encounter: Payer: Medicaid Other | Admitting: Gastroenterology

## 2018-02-14 ENCOUNTER — Encounter (HOSPITAL_COMMUNITY): Payer: Self-pay | Admitting: Psychiatry

## 2018-02-14 ENCOUNTER — Ambulatory Visit (INDEPENDENT_AMBULATORY_CARE_PROVIDER_SITE_OTHER): Payer: Medicaid Other | Admitting: Psychiatry

## 2018-02-14 VITALS — BP 128/76 | HR 66 | Ht 67.0 in | Wt 270.8 lb

## 2018-02-14 DIAGNOSIS — F4010 Social phobia, unspecified: Secondary | ICD-10-CM | POA: Diagnosis not present

## 2018-02-14 DIAGNOSIS — F330 Major depressive disorder, recurrent, mild: Secondary | ICD-10-CM

## 2018-02-14 DIAGNOSIS — D500 Iron deficiency anemia secondary to blood loss (chronic): Secondary | ICD-10-CM

## 2018-02-14 DIAGNOSIS — F419 Anxiety disorder, unspecified: Secondary | ICD-10-CM

## 2018-02-14 DIAGNOSIS — Z9149 Other personal history of psychological trauma, not elsewhere classified: Secondary | ICD-10-CM | POA: Diagnosis not present

## 2018-02-14 DIAGNOSIS — Z560 Unemployment, unspecified: Secondary | ICD-10-CM | POA: Diagnosis not present

## 2018-02-14 MED ORDER — HYDROXYZINE PAMOATE 25 MG PO CAPS: ORAL_CAPSULE | ORAL | 0 refills | Status: DC
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
## 2018-02-21 ENCOUNTER — Encounter: Payer: Medicaid Other | Admitting: Gastroenterology

## 2018-02-23 ENCOUNTER — Inpatient Hospital Stay: Payer: Medicaid Other | Attending: Oncology | Admitting: Oncology

## 2018-02-23 ENCOUNTER — Telehealth: Payer: Self-pay

## 2018-02-23 ENCOUNTER — Inpatient Hospital Stay: Payer: Medicaid Other

## 2018-02-23 VITALS — BP 175/101 | HR 75 | Temp 98.6°F | Resp 18 | Ht 67.0 in | Wt 270.1 lb

## 2018-02-23 DIAGNOSIS — Z79899 Other long term (current) drug therapy: Secondary | ICD-10-CM | POA: Diagnosis not present

## 2018-02-23 DIAGNOSIS — N920 Excessive and frequent menstruation with regular cycle: Secondary | ICD-10-CM | POA: Diagnosis not present

## 2018-02-23 DIAGNOSIS — M549 Dorsalgia, unspecified: Secondary | ICD-10-CM | POA: Diagnosis not present

## 2018-02-23 DIAGNOSIS — M2550 Pain in unspecified joint: Secondary | ICD-10-CM | POA: Diagnosis not present

## 2018-02-23 DIAGNOSIS — D500 Iron deficiency anemia secondary to blood loss (chronic): Secondary | ICD-10-CM | POA: Diagnosis not present

## 2018-02-23 DIAGNOSIS — Z90710 Acquired absence of both cervix and uterus: Secondary | ICD-10-CM | POA: Diagnosis not present

## 2018-02-23 NOTE — Progress Notes (Signed)
Hematology and Oncology Follow Up Visit  Erin Bradford [id number] 04/30/1971 47 y.o. 02/23/2018 10:37 AM Erin Bradford, MDBoyd, Erin Factor, MD   Principle Diagnosis: 47 year old woman with iron deficiency anemia due menorrhagia.  He was initially diagnosed in February 2018 with iron of 31 and saturation of 8%.   Prior Therapy:  She status post Feraheme infusion for a total of 1000 mg completed in February 2018. She is status post hysterectomy completed on 10/06/2017.  Current therapy: Intravenous iron as needed.  Interim History: Erin Bradford is here for a follow-up by herself.  The last visit, she reports no major changes in his health.  She continues to have issues with back pain and joint pain and she is awaiting referral to rheumatology.  She denies any hematochezia or melena.  She denies any hemoptysis or hematoma she denies any excessive fatigue or tiredness.  She received her last intravenous iron in December 2018 and tolerated it well.  She does not report any blurry vision, syncope or seizures or altered mental status. She does not report any fevers, chills, weight loss. She does not report any chest pain, palpitation, orthopnea or leg edema. She does not report any cough, wheezing or hemoptysis.  She denies any dyspnea on exertion.  She does not report any nausea, vomiting or abdominal pain.  Denies any early satiety.  She does not report any dysuria or dysuria. She does not report any skeletal complaints. Remaining review of systems is negative.  Medications: I have reviewed the patient's current medications.  Current Outpatient Medications  Medication Sig Dispense Refill  . buPROPion (WELLBUTRIN XL) 300 MG 24 hr tablet Take 300 mg by mouth daily.    . carvedilol (COREG) 12.5 MG tablet TAKE 1 & 1/2 (ONE & ONE-HALF) TABLETS BY MOUTH TWICE DAILY 270 tablet 2  . cetirizine (ZYRTEC) 10 MG tablet Take 10 mg by mouth at bedtime as needed for allergies.     . Fluocinolone  Acetonide Body 0.01 % OIL Place 2 drops into both ears 2 (two) times daily.     . furosemide (LASIX) 20 MG tablet Take 20 mg by mouth 2 (two) times daily.     Marland Kitchen gabapentin (NEURONTIN) 300 MG capsule Take 300 mg by mouth 3 (three) times daily.     Marland Kitchen HYDROcodone-acetaminophen (NORCO) 10-325 MG tablet Take 1 tablet by mouth 4 (four) times daily.     . hydrOXYzine (VISTARIL) 25 MG capsule Take 1-2 capsule at bed time 45 capsule 0  . losartan (COZAAR) 100 MG tablet Take 100 mg by mouth daily.    . meloxicam (MOBIC) 15 MG tablet TK 1 T PO D FOR ARTHRITIS PAIN  1  . primidone (MYSOLINE) 50 MG tablet Take 6 tablets (300 mg total) by mouth at bedtime. 180 tablet 2  . sertraline (ZOLOFT) 50 MG tablet Take 1 tablet by mouth daily.    Marland Kitchen spironolactone (ALDACTONE) 12.5 mg TABS tablet Take 12.5 mg by mouth 2 (two) times daily.    Marland Kitchen tiZANidine (ZANAFLEX) 4 MG tablet Take 4 mg by mouth 2 (two) times daily.      No current facility-administered medications for this visit.      Allergies:  Allergies  Allergen Reactions  . Ace Inhibitors Anaphylaxis and Swelling    Swelling of tongue.    Past Medical History, Surgical history, Social history, and Family History reviewed and unchanged.   Physical Exam: Blood pressure (!) 175/101, pulse 75, temperature 98.6 F (37 C), temperature source  Oral, resp. rate 18, height 5\' 7"  (1.702 m), weight 270 lb 1.6 oz (122.5 kg), last menstrual period 09/17/2017, SpO2 99 %.   ECOG:0  General appearance: Comfortable appearing woman appeared without distress. Head: Normocephalic, without obvious abnormality  Oropharynx: No oral lesions or ulcers.  Mucous membranes are moist. Eyes: No scleral icterus.  Pupils are equal and round reactive to light. Lymph nodes: Cervical, supraclavicular, and axillary nodes normal. Heart: Regular rate and rhythm without murmurs or gallops. Lung: Clear to auscultation without wheezes or dullness to percussion. Abdomin: Soft, nontender  without any rebound or guarding.  Good bowel sounds. Musculoskeletal: No joint deformity or effusion. Skin: No rashes or lesions.   Lab Results: Lab Results  Component Value Date   WBC 10.1 11/24/2017   HGB 9.8 (L) 11/24/2017   HCT 31.9 (L) 11/24/2017   MCV 78.4 11/24/2017   PLT 544 (H) 11/24/2017     Chemistry      Component Value Date/Time   NA 138 11/24/2017 1313   NA 138 02/04/2017 1438   K 4.9 11/24/2017 1313   K 3.8 02/04/2017 1438   CL 102 11/24/2017 1313   CO2 28 11/24/2017 1313   CO2 26 02/04/2017 1438   BUN 7 11/24/2017 1313   BUN 8.9 02/04/2017 1438   CREATININE 1.21 (H) 11/24/2017 1313   CREATININE 1.0 02/04/2017 1438      Component Value Date/Time   CALCIUM 9.3 11/24/2017 1313   CALCIUM 9.2 02/04/2017 1438   ALKPHOS 108 11/24/2017 1313   ALKPHOS 90 02/04/2017 1438   AST 52 (H) 11/24/2017 1313   AST 17 02/04/2017 1438   ALT 10 (L) 11/24/2017 1313   ALT 20 02/04/2017 1438   BILITOT 1.5 (H) 11/24/2017 1313   BILITOT <0.22 02/04/2017 1438          Impression and Plan:  47 year old woman with the following issues:  1. Iron deficiency anemia due to menstrual blood losses.  She has poor tolerance and absorption issue related to oral iron and has been receiving intermittent intravenous iron.  Her hemoglobin obtained on February 17, 2018 showed a hemoglobin of 13 7 with a normal MCV.  Iron studies are currently pending.  The plan is to continue with active surveillance at this time and retreat her with intravenous iron as needed.  We will repeat iron studies in 2 months and subsequently in 3 months.  She understands that repeat IV iron may be needed in the future.  Risks and benefits of repeat intravenous iron was discussed and she is agreeable to receive that if needed 2.   2. Menorrhagia: Resolved after hysterectomy for uterine fibroids.  3. Colon cancer screening: She is scheduled for a colonoscopy in the near future..  4. Follow-up: Will be in  2  months to repeat iron studies and a 3 months after that.   15  minutes was spent with the patient face-to-face today.  More than 50% of time was dedicated to patient counseling, education and coordination of her care.    Zola Button, MD 2/28/201910:37 AM

## 2018-02-23 NOTE — Telephone Encounter (Signed)
Printed avs and calender of upcoming appointment. Per 2/28 los 

## 2018-02-24 ENCOUNTER — Encounter: Payer: Self-pay | Admitting: Cardiology

## 2018-02-24 ENCOUNTER — Ambulatory Visit (INDEPENDENT_AMBULATORY_CARE_PROVIDER_SITE_OTHER): Payer: Medicaid Other | Admitting: Cardiology

## 2018-02-24 ENCOUNTER — Other Ambulatory Visit: Payer: Self-pay | Admitting: Cardiology

## 2018-02-24 ENCOUNTER — Telehealth: Payer: Self-pay | Admitting: *Deleted

## 2018-02-24 VITALS — BP 128/86 | HR 69 | Ht 67.0 in | Wt 266.0 lb

## 2018-02-24 DIAGNOSIS — R002 Palpitations: Secondary | ICD-10-CM

## 2018-02-24 DIAGNOSIS — R400 Somnolence: Secondary | ICD-10-CM

## 2018-02-24 DIAGNOSIS — R0683 Snoring: Secondary | ICD-10-CM

## 2018-02-24 DIAGNOSIS — I10 Essential (primary) hypertension: Secondary | ICD-10-CM

## 2018-02-24 MED ORDER — CARVEDILOL 12.5 MG PO TABS: ORAL_TABLET | ORAL | 3 refills | Status: DC

## 2018-02-24 NOTE — Telephone Encounter (Signed)
Sent to pre cert/ sleep pool 

## 2018-02-24 NOTE — Progress Notes (Signed)
Electrophysiology Office Note   Date:  08/31/6386   ID:  Erin Bradford, DOB [birth year], MRN [id number]  PCP:  Bartholome Bill, MD Primary Electrophysiologist:  Tereka Thorley Meredith Leeds, MD    Chief Complaint  Patient presents with  . Follow-up    Palpitations     History of Present Illness: Erin Bradford is a 47 y.o. female who presents today for electrophysiology evaluation.   She has a history of hypertension, obesity, palpitations, and chronic hypokalemia. She had an electrocardiogram at her primary physician's office that showed sinus rhythm with inferior T-wave inversions.   Today, denies symptoms of chest pain, shortness of breath, orthopnea, PND, lower extremity edema, claudication, dizziness, presyncope, syncope, bleeding, or neurologic sequela. The patient is tolerating medications without difficulties.  She is overall feeling well.  She does continue to have episodic palpitations.  They last up to 30 seconds at a time and occur once or twice a month.  Not associated with shortness of breath or chest pain.  She had a hysterectomy in October which was complicated by C. difficile infection and wound infection.  She is currently followed by gastroenterology.  She is also been seeing a therapist over the last few weeks.   Past Medical History:  Diagnosis Date  . Abnormal laboratory test   . Anemia   . Anxiety   . BMI 39.0-39.9,adult   . Chronic hypokalemia   . Depression   . Hypertension   . Insomnia   . Low back pain   . Obesity   . Occasional tremors   . Palpitations    dx with tachycardia   . PONV (postoperative nausea and vomiting)   . Tiredness    Past Surgical History:  Procedure Laterality Date  . CESAREAN SECTION    . TOOTH EXTRACTION    . TOTAL LAPAROSCOPIC HYSTERECTOMY WITH SALPINGECTOMY Bilateral 10/06/2017   Procedure: ATTEMPTED HYSTERECTOMY TOTAL LAPAROSCOPIC WITH SALPINGECTOMY CONVERTED TO OPEN ABDOMINAL TOTAL HYSTERECTOMY WITH SALPINGECTOMY;   Surgeon: Sherlyn Hay, DO;  Location: [facility];  Service: Gynecology;  Laterality: Bilateral;     Current Outpatient Medications  Medication Sig Dispense Refill  . buPROPion (WELLBUTRIN XL) 300 MG 24 hr tablet Take 300 mg by mouth daily.    . carvedilol (COREG) 12.5 MG tablet TAKE 1 & 1/2 (ONE & ONE-HALF) TABLETS BY MOUTH TWICE DAILY 270 tablet 2  . cetirizine (ZYRTEC) 10 MG tablet Take 10 mg by mouth at bedtime as needed for allergies.     . Fluocinolone Acetonide Body 0.01 % OIL Place 2 drops into both ears 2 (two) times daily.     . furosemide (LASIX) 20 MG tablet Take 20 mg by mouth 2 (two) times daily.     Marland Kitchen gabapentin (NEURONTIN) 300 MG capsule Take 300 mg by mouth 3 (three) times daily.     Marland Kitchen HYDROcodone-acetaminophen (NORCO) 10-325 MG tablet Take 1 tablet by mouth 4 (four) times daily.     . hydrOXYzine (VISTARIL) 25 MG capsule Take 1-2 capsule at bed time 45 capsule 0  . losartan (COZAAR) 100 MG tablet Take 100 mg by mouth daily.    . primidone (MYSOLINE) 50 MG tablet Take 6 tablets (300 mg total) by mouth at bedtime. 180 tablet 2  . sertraline (ZOLOFT) 50 MG tablet Take 1 tablet by mouth daily.    Marland Kitchen spironolactone (ALDACTONE) 50 MG tablet Take 50 mg by mouth 2 (two) times daily.    Marland Kitchen tiZANidine (ZANAFLEX) 4 MG tablet  Take 4 mg by mouth 2 (two) times daily.      No current facility-administered medications for this visit.     Allergies:   Ace inhibitors   Social History:  The patient  reports that  has never smoked. she has never used smokeless tobacco. She reports that she does not drink alcohol or use drugs.   Family History:  The patient's family history includes Atrial fibrillation in her sister; Cancer in her maternal grandfather; Diabetes in her maternal grandmother and mother; Hyperlipidemia in her mother; Hypertension in her maternal grandmother and mother; Kidney disease in her father and maternal grandmother; Pancreatic cancer in her maternal  grandmother; Sarcoidosis in her mother.   ROS:  Please see the history of present illness.   Otherwise, review of systems is positive for sweats, palpitations, snoring, diarrhea, depression, anxiety, back pain, muscle pain, joint swelling, balance problems.   All other systems are reviewed and negative.   PHYSICAL EXAM: VS:  BP 128/86   Pulse 69   Ht 5\' 7"  (1.702 m)   Wt 266 lb (120.7 kg)   LMP 09/17/2017 (Approximate)   BMI 41.66 kg/m  , BMI Body mass index is 41.66 kg/m. GEN: Well nourished, well developed, in no acute distress  HEENT: normal  Neck: no JVD, carotid bruits, or masses Cardiac: RRR; no murmurs, rubs, or gallops,no edema  Respiratory:  clear to auscultation bilaterally, normal work of breathing GI: soft, nontender, nondistended, + BS MS: no deformity or atrophy  Skin: warm and dry Neuro:  Strength and sensation are intact Psych: euthymic mood, full affect  EKG:  EKG is ordered today. Personal review of the ekg ordered shows sinus rhythm, rate 69, T wave flattening  Recent Labs: 11/24/2017: ALT 10; BUN 7; Creatinine, Ser 1.21; Hemoglobin 9.8; Platelets 544; Potassium 4.9; Sodium 138 12/16/2017: TSH 1.05    Lipid Panel  No results found for: CHOL, TRIG, HDL, CHOLHDL, VLDL, LDLCALC, LDLDIRECT   Wt Readings from Last 3 Encounters:  02/24/18 266 lb (120.7 kg)  02/23/18 270 lb 1.6 oz (122.5 kg)  01/10/18 273 lb 2 oz (123.9 kg)      Other studies Reviewed: Additional studies/ records that were reviewed today include: Cardiac monitor 12/14/17 Sinus rhythm and sinus tachycardia All symptoms associated with sinus rhythm and sinus tachycardia No arrhythmias seen Zero atrial fibrillation   ASSESSMENT AND PLAN:  1.  Palpitations: Early on carvedilol.  She is tolerating the medication well.  She is having up to 30 seconds of palpitations, rarely.  She is not concerned.  No changes.  2. Hypertension: Controlled.  No changes.  3. Snoring: Planning a sleep  study, but she has been too busy and unable to schedule.  She also had a hysterectomy which was complicated by infection.  She wishes to have a repeat sleep study.  We Erin Bradford put the order in today.  Current medicines are reviewed at length with the patient today.   The patient does not have concerns regarding her medicines.  The following changes were made today: None  Labs/ tests ordered today include:  Orders Placed This Encounter  Procedures  . EKG 12-Lead     Disposition:   FU with Erin Bradford 1 year  Signed, Tzvi Economou Meredith Leeds, MD  02/24/2018 2:23 PM     [address] [place] [id number] [phone] (office) [phone] (fax).inic

## 2018-02-24 NOTE — Telephone Encounter (Signed)
-----   Message from Stanton Kidney, RN sent at 02/24/2018 12:44 PM EST ----- Regarding: FW: Sleep study   ----- Message ----- From: Stanton Kidney, RN Sent: 12/06/2016  11:34 AM To: Gershon Cull, Stanton Kidney, RN Subject: Sleep study                                    Please pre cert and schedule sleep study. Dx: Snoring, Daytime somnolence, obesity, HTN  Ep Scale: 1)  1 2)  2 3)  0 4)  0 5)  3 6)  0 7)  0 8)  0 --------------------- Total = 6

## 2018-02-24 NOTE — Patient Instructions (Addendum)
Medication Instructions:  Your physician recommends that you continue on your current medications as directed. Please refer to the Current Medication list given to you today.  * If you need a refill on your cardiac medications before your next appointment, please call your pharmacy. *  Labwork: None ordered  Testing/Procedures: Your physician has recommended that you have a sleep study. This test records several body functions during sleep, including: brain activity, eye movement, oxygen and carbon dioxide blood levels, heart rate and rhythm, breathing rate and rhythm, the flow of air through your mouth and nose, snoring, body muscle movements, and chest and belly movement.  The office will call you to arrange this testing.  Follow-Up: Your physician wants you to follow-up in: 1 year with Dr. Curt Bears.  You will receive a reminder letter in the mail two months in advance. If you don't receive a letter, please call our office to schedule the follow-up appointment.  Thank you for choosing [facility]!!   Trinidad Curet, RN [phone]

## 2018-02-24 NOTE — Addendum Note (Signed)
Addended by: Stanton Kidney on: 02/24/2018 04:58 PM   Modules accepted: Orders

## 2018-03-07 ENCOUNTER — Telehealth (HOSPITAL_COMMUNITY): Payer: Self-pay

## 2018-03-07 ENCOUNTER — Other Ambulatory Visit (HOSPITAL_COMMUNITY): Payer: Self-pay | Admitting: Psychiatry

## 2018-03-07 NOTE — Telephone Encounter (Signed)
Patient called and said that the Vistaril is not working for sleep and would like to go back to Ambien. Please review and advise, thank you

## 2018-03-07 NOTE — Telephone Encounter (Signed)
Stop Ambien because it was not working.  Either she can take to Vistaril or she can try trazodone 5200 mg at bedtime.

## 2018-03-08 ENCOUNTER — Other Ambulatory Visit: Payer: Self-pay | Admitting: Neurology

## 2018-03-10 MED ORDER — TRAZODONE HCL 100 MG PO TABS: 200.0000 mg | ORAL_TABLET | Freq: Every day | ORAL | 0 refills | Status: DC

## 2018-03-10 NOTE — Telephone Encounter (Signed)
Okay, I called patient and she said that Trazodone id not work in the past, but she was only on a 100 mg dose, I told her this one was 200 mg. Patient states she will give it a try and call us back to let us know if it is working or not.

## 2018-03-13 ENCOUNTER — Encounter (HOSPITAL_COMMUNITY): Payer: Self-pay | Admitting: Licensed Clinical Social Worker

## 2018-03-13 ENCOUNTER — Other Ambulatory Visit (HOSPITAL_COMMUNITY): Payer: Self-pay | Admitting: Psychiatry

## 2018-03-13 ENCOUNTER — Telehealth (HOSPITAL_COMMUNITY): Payer: Self-pay

## 2018-03-13 ENCOUNTER — Ambulatory Visit (INDEPENDENT_AMBULATORY_CARE_PROVIDER_SITE_OTHER): Payer: Medicaid Other | Admitting: Licensed Clinical Social Worker

## 2018-03-13 DIAGNOSIS — F330 Major depressive disorder, recurrent, mild: Secondary | ICD-10-CM | POA: Diagnosis not present

## 2018-03-13 NOTE — Telephone Encounter (Signed)
Patient is here to see Eustaquio Maize today and reports that the increase in the Trazodone is not working. She states she is sleep deprived. She has tried and failed Ambien, Vistaril and Trazodone. Please review and advise, thank you

## 2018-03-13 NOTE — Telephone Encounter (Signed)
She can try Thorazine 10-20 mg at bedtime for insomnia.  She need to stop the Vistaril and trazodone.  Please call Thorazine to her local pharmacy.
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
## 2018-03-14 MED ORDER — CHLORPROMAZINE HCL 10 MG PO TABS: ORAL_TABLET | ORAL | 0 refills | Status: DC

## 2018-03-14 NOTE — Telephone Encounter (Signed)
I sent in the new prescription and called the patient to let her know

## 2018-03-17 ENCOUNTER — Ambulatory Visit (AMBULATORY_SURGERY_CENTER): Payer: Self-pay | Admitting: *Deleted

## 2018-03-17 ENCOUNTER — Other Ambulatory Visit: Payer: Self-pay

## 2018-03-17 VITALS — Ht 67.0 in | Wt 274.2 lb

## 2018-03-17 DIAGNOSIS — D508 Other iron deficiency anemias: Secondary | ICD-10-CM

## 2018-03-17 NOTE — Progress Notes (Signed)
No egg or soy allergy known to patient   issues with past sedation with any surgeries  or procedures, ( Sometimes N&V )  No problems with intubation No diet pills per patient No home 02 use per patient  No blood thinners per patient  Pt denies issues with constipation  No A fib or A flutter  EMMI video sent to pt's e mail

## 2018-03-20 ENCOUNTER — Ambulatory Visit (INDEPENDENT_AMBULATORY_CARE_PROVIDER_SITE_OTHER): Payer: Medicaid Other | Admitting: Psychiatry

## 2018-03-20 ENCOUNTER — Encounter (HOSPITAL_COMMUNITY): Payer: Self-pay | Admitting: Psychiatry

## 2018-03-20 ENCOUNTER — Other Ambulatory Visit (HOSPITAL_COMMUNITY): Payer: Self-pay | Admitting: Psychiatry

## 2018-03-20 VITALS — BP 136/82 | HR 81 | Ht 67.0 in | Wt 271.8 lb

## 2018-03-20 DIAGNOSIS — F419 Anxiety disorder, unspecified: Secondary | ICD-10-CM

## 2018-03-20 DIAGNOSIS — M549 Dorsalgia, unspecified: Secondary | ICD-10-CM

## 2018-03-20 DIAGNOSIS — F330 Major depressive disorder, recurrent, mild: Secondary | ICD-10-CM | POA: Diagnosis not present

## 2018-03-20 DIAGNOSIS — G4700 Insomnia, unspecified: Secondary | ICD-10-CM | POA: Diagnosis not present

## 2018-03-20 DIAGNOSIS — M2550 Pain in unspecified joint: Secondary | ICD-10-CM

## 2018-03-20 DIAGNOSIS — Z560 Unemployment, unspecified: Secondary | ICD-10-CM | POA: Diagnosis not present

## 2018-03-20 MED ORDER — CHLORPROMAZINE HCL 25 MG PO TABS: 25.0000 mg | ORAL_TABLET | Freq: Every day | ORAL | 1 refills | Status: DC

## 2018-03-20 NOTE — Progress Notes (Signed)
BH MD/PA/NP OP Progress Note  03/18/5572 22:02 PM Erin Bradford  MRN:  [id number]  Chief Complaint: I am doing better.  The new medicine helping with sleep.  HPI: Patient is 47 year old African-American widowed unemployed female who was seen first time 4 weeks ago as referred from primary care physician for the management of depression and anxiety symptoms.  She was taking Wellbutrin and her primary care physician recently added 50 mg of Zoloft.  Patient doing better with the medication.  She had chronic insomnia and we tried Ambien and then Vistaril and then trazodone but none of these medication helped her.  Finally we decided to give Thorazine and she is taking 20 mg Thorazine for past few days and she feels it is helping her sleep.  She still have chronic somatic symptoms including low back pain but she endorsed her energy level is improved from the past.  She has lack of motivation and she worried about her future but with the help of medication she feel sleep improved and motivation to do things.  She is seeing Charolotte Eke for therapy.  Patient also taking multiple medication for her general health.  Patient denies any psychosis, hallucination, suicidal thoughts or homicidal thought.  Sometimes she endorsed excessive sweating with the Zoloft but no other major concern.  Patient has tremors and she is to taking primidone for tremors.  She lives with the mother and her 29 year old daughter.  Patient denies drinking alcohol or using any illegal substances.  Her energy level is improved.  She lost a few pounds since the last visit and she is happy about it.  Recently she seen cardiologist for palpitation.  She has hypertension, iron deficiency anemia, obesity and chronic back pain.  She is no longer taking Tinazidine and she is prescribed methacarbacal 750 mg twice a day.    Visit Diagnosis:    ICD-10-CM   1. MDD (major depressive disorder), recurrent episode, mild (HCC) F33.0 chlorproMAZINE  (THORAZINE) 25 MG tablet    Past Psychiatric History: Reviewed She denies any history of psychiatric inpatient treatment.  She has been taking antidepressant for past few years from her primary care physician.  She tried Prozac which worked for a few years and then was switched to Wellbutrin and lately Zoloft added.    Tried Ambien, Vistaril and trazodone for insomnia with poor outcome.  Patient denies any history of suicidal attempt, mania, psychosis or any hallucination.  Patient completed intensive outpatient program at old [facility]   Past Medical History:  Past Medical History:  Diagnosis Date  . Abnormal laboratory test   . Allergy   . Anemia   . Anxiety   . Arthritis   . Blood transfusion without reported diagnosis   . BMI 39.0-39.9,adult   . Chronic hypokalemia   . Chronic kidney disease    mild  . Depression   . GERD (gastroesophageal reflux disease)   . Hypertension   . Insomnia   . Low back pain   . Obesity   . Occasional tremors   . Palpitations    dx with tachycardia   . PONV (postoperative nausea and vomiting)   . Tiredness     Past Surgical History:  Procedure Laterality Date  . CESAREAN SECTION    . TOOTH EXTRACTION    . TOTAL LAPAROSCOPIC HYSTERECTOMY WITH SALPINGECTOMY Bilateral 10/06/2017   Procedure: ATTEMPTED HYSTERECTOMY TOTAL LAPAROSCOPIC WITH SALPINGECTOMY CONVERTED TO OPEN ABDOMINAL TOTAL HYSTERECTOMY WITH SALPINGECTOMY;  Surgeon: Sherlyn Hay, DO;  Location:  [facility];  Service: Gynecology;  Laterality: Bilateral;    Family Psychiatric History: Reviewed  Family History:  Family History  Problem Relation Age of Onset  . Diabetes Mother   . Hypertension Mother   . Hyperlipidemia Mother   . Sarcoidosis Mother   . Kidney disease Father   . Atrial fibrillation Sister   . Kidney disease Maternal Grandmother   . Pancreatic cancer Maternal Grandmother   . Diabetes Maternal Grandmother   . Hypertension Maternal  Grandmother   . Cancer Maternal Grandfather   . Colon cancer Neg Hx   . Colon polyps Neg Hx   . Esophageal cancer Neg Hx   . Stomach cancer Neg Hx   . Rectal cancer Neg Hx     Social History:  Social History   Socioeconomic History  . Marital status: Widowed    Spouse name: Not on file  . Number of children: Not on file  . Years of education: Not on file  . Highest education level: Not on file  Occupational History  . Not on file  Social Needs  . Financial resource strain: Not on file  . Food insecurity:    Worry: Not on file    Inability: Not on file  . Transportation needs:    Medical: Not on file    Non-medical: Not on file  Tobacco Use  . Smoking status: Never Smoker  . Smokeless tobacco: Never Used  Substance and Sexual Activity  . Alcohol use: No  . Drug use: No  . Sexual activity: Never    Birth control/protection: Condom  Lifestyle  . Physical activity:    Days per week: Not on file    Minutes per session: Not on file  . Stress: Not on file  Relationships  . Social connections:    Talks on phone: Not on file    Gets together: Not on file    Attends religious service: Not on file    Active member of club or organization: Not on file    Attends meetings of clubs or organizations: Not on file    Relationship status: Not on file  Other Topics Concern  . Not on file  Social History Narrative  . Not on file    Allergies:  Allergies  Allergen Reactions  . Ace Inhibitors Anaphylaxis and Swelling    Swelling of tongue.    Metabolic Disorder Labs: No results found for: HGBA1C, MPG No results found for: PROLACTIN No results found for: CHOL, TRIG, HDL, CHOLHDL, VLDL, LDLCALC Lab Results  Component Value Date   TSH 1.05 12/16/2017   TSH 0.92 02/18/2017    Therapeutic Level Labs: No results found for: LITHIUM No results found for: VALPROATE No components found for:  CBMZ  Current Medications: Current Outpatient Medications  Medication Sig  Dispense Refill  . ACCU-CHEK AVIVA PLUS test strip USE TO CHECK BLOOD SUGAR TWICE WEEKLY  11  . buPROPion (WELLBUTRIN XL) 300 MG 24 hr tablet Take 300 mg by mouth daily.    . carvedilol (COREG) 12.5 MG tablet Take 1 & 1/2 tablets (18.75 mg total) by mouth twice daily 270 tablet 3  . cetirizine (ZYRTEC) 10 MG tablet Take 10 mg by mouth at bedtime as needed for allergies.     . chlorproMAZINE (THORAZINE) 10 MG tablet Take 1 (10 mg) to 2 (20 mg) tabs at bedtime for sleep 60 tablet 0  . Fluocinolone Acetonide Body 0.01 % OIL Place 2 drops into both ears 2 (  two) times daily.     . furosemide (LASIX) 20 MG tablet Take 20 mg by mouth 2 (two) times daily.     Marland Kitchen gabapentin (NEURONTIN) 300 MG capsule Take 300 mg by mouth 3 (three) times daily.     . Glucose Blood (BLOOD GLUCOSE TEST STRIPS) STRP 2 (two) times a week.    Marland Kitchen HYDROcodone-acetaminophen (NORCO) 10-325 MG tablet Take 1 tablet by mouth 4 (four) times daily.     Marland Kitchen losartan (COZAAR) 100 MG tablet Take 100 mg by mouth daily.    . primidone (MYSOLINE) 50 MG tablet TAKE 6 TABLETS(300 MG) BY MOUTH AT BEDTIME 180 tablet 2  . sertraline (ZOLOFT) 50 MG tablet Take 1 tablet by mouth daily.    Marland Kitchen spironolactone (ALDACTONE) 50 MG tablet Take 50 mg by mouth 2 (two) times daily.    Marland Kitchen tiZANidine (ZANAFLEX) 4 MG tablet Take 4 mg by mouth 2 (two) times daily.      No current facility-administered medications for this visit.      Musculoskeletal: Strength & Muscle Tone: within normal limits Gait & Station: normal Patient leans: N/A  Psychiatric Specialty Exam: Review of Systems  Constitutional: Negative.   Musculoskeletal: Positive for back pain and joint pain.    Blood pressure 136/82, pulse 81, height 5\' 7"  (1.702 m), weight 271 lb 12.8 oz (123.3 kg), last menstrual period 09/17/2017.There is no height or weight on file to calculate BMI.  General Appearance: Casual  Eye Contact:  Good  Speech:  Clear and Coherent  Volume:  Normal  Mood:  Euthymic   Affect:  Congruent  Thought Process:  Goal Directed  Orientation:  Full (Time, Place, and Person)  Thought Content: Logical   Suicidal Thoughts:  No  Homicidal Thoughts:  No  Memory:  Immediate;   Good Recent;   Good Remote;   Good  Judgement:  Good  Insight:  Good  Psychomotor Activity:  Mild tremors in her hand  Concentration:  Concentration: Fair and Attention Span: Fair  Recall:  Good  Fund of Knowledge: Good  Language: Good  Akathisia:  No  Handed:  Right  AIMS (if indicated): not done  Assets:  Communication Skills Desire for Improvement Resilience Social Support  ADL's:  Intact  Cognition: WNL  Sleep:  Improved from the past   Screenings:   Assessment and Plan: Major depressive disorder, recurrent.  Anxiety disorder NOS.  Patient doing better on her current medication.  She is getting Wellbutrin XL 300 mg daily and Zoloft 50 mg from her primary care physician.  I recommended to try Thorazine 25 mg to help insomnia and to help the depression.  Encouraged to continue CBT treatment with Charolotte Eke.  We are still waiting records from old [facility] where she had intensive outpatient treatment.  Discussed medication side effects and benefits.  Recommended to call us back if she has any question, concern if she feels worsening of the symptoms.  Follow-up in 2 months.   [name], MD 03/20/2018, 12:14 PM

## 2018-03-21 NOTE — Telephone Encounter (Signed)
Dose changes recently and patient need to try for 30 days.  Do not add refills without permission.

## 2018-03-27 ENCOUNTER — Other Ambulatory Visit (HOSPITAL_COMMUNITY): Payer: Self-pay | Admitting: Psychiatry

## 2018-03-27 ENCOUNTER — Telehealth (HOSPITAL_COMMUNITY): Payer: Self-pay

## 2018-03-27 MED ORDER — ESZOPICLONE 3 MG PO TABS: 3.0000 mg | ORAL_TABLET | Freq: Every evening | ORAL | 0 refills | Status: DC | PRN

## 2018-03-27 NOTE — Telephone Encounter (Signed)
Called pharmacy and d/c'd the Thorazine, called in the [place]. I called the patient and let her know

## 2018-03-27 NOTE — Telephone Encounter (Signed)
Discontinue Thorazine.  She had tried Vistaril, trazodone, Ambien with poor response.  Recommended to try Lunesta 3 mg at bedtime.  Please call prescription to her pharmacy.

## 2018-03-27 NOTE — Telephone Encounter (Signed)
Patient called and she said the Thorazine is not working for sleep, she is still up all hours of the night. Please review and advise, thank you

## 2018-03-31 ENCOUNTER — Encounter: Payer: Medicaid Other | Admitting: Gastroenterology

## 2018-04-03 NOTE — Addendum Note (Signed)
Addended by: Freada Bergeron on: 04/03/2018 04:55 PM   Modules accepted: Orders

## 2018-04-04 ENCOUNTER — Encounter (HOSPITAL_COMMUNITY): Payer: Self-pay | Admitting: Licensed Clinical Social Worker

## 2018-04-04 ENCOUNTER — Ambulatory Visit (INDEPENDENT_AMBULATORY_CARE_PROVIDER_SITE_OTHER): Payer: Medicaid Other | Admitting: Licensed Clinical Social Worker

## 2018-04-04 DIAGNOSIS — F330 Major depressive disorder, recurrent, mild: Secondary | ICD-10-CM | POA: Diagnosis not present

## 2018-04-04 NOTE — Progress Notes (Signed)
   THERAPIST PROGRESS NOTE  Session Time: 2:10-3pm  Participation Level: Active  Behavioral Response: CasualAlertEuthymic  Type of Therapy: Individual Therapy  Treatment Goals addressed: Coping  Interventions: CBT  Summary: Erin Bradford is a 47 y.o. female who presents for her initial individual counseling session. Spent a considerable amount of time building a trusting therapeutic relationship. Pt lives with her mother and her 69 yo daughter. Her older daughter, who lives in [place], are estranged.  She has chronic pain, however because the weather is becoming warmer her pain has lessened. 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 She hs sleep issues which she has spoken with her psychiatrist, Dr. Adele Schilder about. She reports since beginning her meds she feels better.  Discussed with pt her symptoms and dx, where pt asked questions. Pt reports she has been trying to be more positive. Taught pt, "check the facts." coping tool for dealing with her negative thoughts.  Suicidal/Homicidal: Nowithout intent/plan  Therapist Response:  Assessed pt'Bradford current functioning and reviewed progress. Assist pt building a trusting therapeutic relationship, discussing her dx and symptoms, and meds. Taught pt dbt coping tool to assist with her negative thoughts.  Plan: Return again in 1 week and will begin OP group next week.  Diagnosis: Axis I: MDD        Erin Bradford,Erin Bradford, LCAS 04/04/2018

## 2018-04-06 NOTE — Telephone Encounter (Signed)
Informed patient of sleep study results and patient understanding was verbalized. Patient is scheduled for lab study on 04/27/18. Patient understands her sleep study will be done at [facility] sleep lab. Patient understands she will receive a sleep packet in a week or so. Patient understands to call if she does not receive the sleep packet in a timely manner. Patient agrees with treatment and thanked me for call

## 2018-04-11 ENCOUNTER — Ambulatory Visit (INDEPENDENT_AMBULATORY_CARE_PROVIDER_SITE_OTHER): Payer: Medicaid Other | Admitting: Licensed Clinical Social Worker

## 2018-04-11 ENCOUNTER — Ambulatory Visit (HOSPITAL_COMMUNITY): Payer: Self-pay | Admitting: Licensed Clinical Social Worker

## 2018-04-11 ENCOUNTER — Encounter (HOSPITAL_COMMUNITY): Payer: Self-pay | Admitting: Licensed Clinical Social Worker

## 2018-04-11 DIAGNOSIS — F330 Major depressive disorder, recurrent, mild: Secondary | ICD-10-CM | POA: Diagnosis not present

## 2018-04-11 NOTE — Progress Notes (Signed)
   THERAPIST PROGRESS NOTE  Session Time: 1:10-2pm  Participation Level: Active  Behavioral Response: CasualAlertEuthymic  Type of Therapy: Individual Therapy  Treatment Goals addressed: Coping  Interventions: CBT  Summary: Erin Bradford is a 47 y.o. female who presents for her individual counseling session. Pt discussed her psychiatric symptoms and current life events. Pt reports she has no side effects of medications and feels they are working well. Pt shared she is focusing now on weight loss. Validated pt on her choice of focus. She has a plan and strategies to work on her goal. Pt walked in with a cane today. Asked open ended questions and used empathic reflection. Suggested to pt that she continue to use her cane to deter further chronic health issues. Talked with pt about her daily routine, and she has no routine except her dr appts and picking her daughter up from school. Suggested to pt to do a daily schedule, where she schedules everything: resting, walking, meditation, breathing exercises, journaling, recreation. Pt was receptive to the suggestion.    Suicidal/Homicidal: Nowithout intent/plan  Therapist Response:  Assessed pt's current functioning and reviewed progress. Assist pt with goal setting/strategies, discussing health issues, scheduling her time, benefits of journaling and meditation. Assisted pt processing for the management of her stressors.  Plan: Return again in 1 week.  Diagnosis: Axis I: MDD        Seva Chancy S, LCAS 04/11/2018

## 2018-04-17 ENCOUNTER — Other Ambulatory Visit (HOSPITAL_COMMUNITY): Payer: Self-pay | Admitting: Psychiatry

## 2018-04-18 ENCOUNTER — Ambulatory Visit (INDEPENDENT_AMBULATORY_CARE_PROVIDER_SITE_OTHER): Payer: Medicaid Other | Admitting: Licensed Clinical Social Worker

## 2018-04-18 ENCOUNTER — Encounter (HOSPITAL_COMMUNITY): Payer: Self-pay | Admitting: Licensed Clinical Social Worker

## 2018-04-18 ENCOUNTER — Ambulatory Visit (HOSPITAL_COMMUNITY): Payer: Medicaid Other | Admitting: Licensed Clinical Social Worker

## 2018-04-18 ENCOUNTER — Ambulatory Visit (HOSPITAL_COMMUNITY): Payer: Self-pay | Admitting: Licensed Clinical Social Worker

## 2018-04-18 DIAGNOSIS — F330 Major depressive disorder, recurrent, mild: Secondary | ICD-10-CM | POA: Diagnosis not present

## 2018-04-18 NOTE — Progress Notes (Signed)
   THERAPIST PROGRESS NOTE  Session Time: 1:10-2pm  Participation Level: Active  Behavioral Response: CasualAlertEuthymic  Type of Therapy: Individual Therapy  Treatment Goals addressed: Coping  Interventions: CBT  Summary: Erin Bradford is a 47 y.o. female who presents for her individual counseling session. Pt discussed her psychiatric symptoms and current life events. Pt reports she has no side effects of medications and feels they are working well. Pt presents tearful today. She had a conversation with her daughter who lives in [place], who she hasn't spoken with in 7 years. Asked open ended questions. Taught pt relationship circles and how she can choose who she wants in  Her inner circle. Gave pt a copy of blank calendar & suggested she fill it out with appts, and scheduling self care. Pt reports she is struggling with memory issues and has an appt with neurologist. She was tearful in disbursing loss of her memory. Asked open ended questions. As suggested pt started meditation for anxiety.       Suicidal/Homicidal: Nowithout intent/plan  Therapist Response:  Assessed pt's current functioning and reviewed progress. Assist pt with  scheduling her time, meditation, relationship circles, memory issues.. Assisted pt processing for the management of her stressors.  Plan: Return again in 1 week to discuss her husband who committed suicide 20+ years ago.  Diagnosis: Axis I: MDD        Skanda Worlds S, LCAS 04/18/2018

## 2018-04-20 ENCOUNTER — Inpatient Hospital Stay: Payer: Medicaid Other | Attending: Oncology

## 2018-04-20 DIAGNOSIS — D500 Iron deficiency anemia secondary to blood loss (chronic): Secondary | ICD-10-CM | POA: Insufficient documentation

## 2018-04-20 DIAGNOSIS — Z90710 Acquired absence of both cervix and uterus: Secondary | ICD-10-CM | POA: Insufficient documentation

## 2018-04-20 DIAGNOSIS — N920 Excessive and frequent menstruation with regular cycle: Secondary | ICD-10-CM | POA: Diagnosis not present

## 2018-04-20 LAB — CBC WITH DIFFERENTIAL (CANCER CENTER ONLY)
Basophils Absolute: 0 10*3/uL (ref 0.0–0.1)
Basophils Relative: 1 %
Eosinophils Absolute: 0.3 10*3/uL (ref 0.0–0.5)
Eosinophils Relative: 4 %
HCT: 39.8 % (ref 34.8–46.6)
Hemoglobin: 13.3 g/dL (ref 11.6–15.9)
Lymphocytes Relative: 37 %
Lymphs Abs: 2.4 10*3/uL (ref 0.9–3.3)
MCH: 29.6 pg (ref 25.1–34.0)
MCHC: 33.4 g/dL (ref 31.5–36.0)
MCV: 88.4 fL (ref 79.5–101.0)
Monocytes Absolute: 0.5 10*3/uL (ref 0.1–0.9)
Monocytes Relative: 8 %
Neutro Abs: 3.3 10*3/uL (ref 1.5–6.5)
Neutrophils Relative %: 50 %
Platelet Count: 336 10*3/uL (ref 145–400)
RBC: 4.5 MIL/uL (ref 3.70–5.45)
RDW: 13.2 % (ref 11.2–14.5)
WBC Count: 6.4 10*3/uL (ref 3.9–10.3)

## 2018-04-20 LAB — IRON AND TIBC
Iron: 71 ug/dL (ref 41–142)
Saturation Ratios: 24 % (ref 21–57)
TIBC: 293 ug/dL (ref 236–444)
UIBC: 221 ug/dL

## 2018-04-20 LAB — FERRITIN: FERRITIN: 141 ng/mL (ref 9–269)

## 2018-04-25 ENCOUNTER — Ambulatory Visit (HOSPITAL_COMMUNITY): Payer: Self-pay | Admitting: Licensed Clinical Social Worker

## 2018-04-25 ENCOUNTER — Other Ambulatory Visit (HOSPITAL_COMMUNITY): Payer: Self-pay

## 2018-04-25 MED ORDER — ESZOPICLONE 3 MG PO TABS: 3.0000 mg | ORAL_TABLET | Freq: Every evening | ORAL | 0 refills | Status: DC | PRN

## 2018-04-27 ENCOUNTER — Telehealth (HOSPITAL_COMMUNITY): Payer: Self-pay

## 2018-04-27 ENCOUNTER — Encounter (HOSPITAL_BASED_OUTPATIENT_CLINIC_OR_DEPARTMENT_OTHER): Payer: Self-pay

## 2018-04-27 ENCOUNTER — Other Ambulatory Visit (HOSPITAL_COMMUNITY): Payer: Self-pay | Admitting: Psychiatry

## 2018-04-27 DIAGNOSIS — M791 Myalgia, unspecified site: Secondary | ICD-10-CM | POA: Diagnosis not present

## 2018-04-27 DIAGNOSIS — M170 Bilateral primary osteoarthritis of knee: Secondary | ICD-10-CM | POA: Diagnosis not present

## 2018-04-27 NOTE — Telephone Encounter (Signed)
She should not take Lunesta every night.  She can take every other night and it may work.  If it does not work then we may need to consider giving Seroquel.

## 2018-04-27 NOTE — Telephone Encounter (Signed)
Patient is calling to report that the Eszopiclone has stopped working, patient said it had been the last 2-3 nights that she has taken the medication and still can not sleep. Please review and advise, thank you

## 2018-04-28 NOTE — Telephone Encounter (Signed)
I called patient and let her know what the doctor said, she voiced understanding and will call me next Tuesday

## 2018-05-01 ENCOUNTER — Telehealth (HOSPITAL_COMMUNITY): Payer: Self-pay

## 2018-05-01 NOTE — Telephone Encounter (Signed)
Patient called and said that the Eszopiclone is currently not working. She said that she took the medication one night and skipped a night, and it's still not working. Patient wants to know can she try Ambien, she has previously use this medication and it worked for her. Please advise

## 2018-05-02 ENCOUNTER — Other Ambulatory Visit (HOSPITAL_COMMUNITY): Payer: Self-pay | Admitting: Psychiatry

## 2018-05-02 ENCOUNTER — Encounter: Payer: Self-pay | Admitting: Cardiology

## 2018-05-02 ENCOUNTER — Ambulatory Visit (HOSPITAL_COMMUNITY): Payer: Self-pay | Admitting: Licensed Clinical Social Worker

## 2018-05-02 ENCOUNTER — Other Ambulatory Visit (HOSPITAL_COMMUNITY): Payer: Self-pay

## 2018-05-02 MED ORDER — ZOLPIDEM TARTRATE 10 MG PO TABS: 10.0000 mg | ORAL_TABLET | Freq: Every evening | ORAL | 0 refills | Status: DC | PRN

## 2018-05-02 NOTE — Telephone Encounter (Signed)
She had tried Ambien in the past but if she want to try again we can call Ambien 10 mg at bedtime as needed for insomnia.

## 2018-05-02 NOTE — Telephone Encounter (Signed)
Called and spoke with patient. Patient wanted Ambien 10mg  to be called into [facility] pharmacy on [address].

## 2018-05-03 ENCOUNTER — Other Ambulatory Visit: Payer: Self-pay | Admitting: Neurology

## 2018-05-03 NOTE — Telephone Encounter (Signed)
Called patient to follow up on cancelled sleep study. Patient will call [place] sleep lab when she is ready to reschedule.

## 2018-05-05 ENCOUNTER — Other Ambulatory Visit (HOSPITAL_COMMUNITY): Payer: Self-pay | Admitting: Psychiatry

## 2018-05-09 ENCOUNTER — Ambulatory Visit (INDEPENDENT_AMBULATORY_CARE_PROVIDER_SITE_OTHER): Payer: Medicaid Other | Admitting: Licensed Clinical Social Worker

## 2018-05-09 ENCOUNTER — Encounter (HOSPITAL_COMMUNITY): Payer: Self-pay | Admitting: Licensed Clinical Social Worker

## 2018-05-09 DIAGNOSIS — F330 Major depressive disorder, recurrent, mild: Secondary | ICD-10-CM | POA: Diagnosis not present

## 2018-05-09 NOTE — Progress Notes (Signed)
   THERAPIST PROGRESS NOTE  Session Time: 1:10-2pm  Participation Level: Active  Behavioral Response: CasualAlertEuthymic  Type of Therapy: Individual Therapy  Treatment Goals addressed: Coping  Interventions: CBT  Summary: [name] is a 46 y.o. female who presents for her individual counseling session. Pt discussed her psychiatric symptoms and current life events. Pt reports she has been sick with sinus infection and just felt out of sorts. Pt has not been sleeping well and Dr. Adele Schilder, psychiatrist, gave her a prescription for Ambien. Prior to the Ambien she did not sleep for 7 days. She has been on Ambien a week and has slept well for 7 days. Pt was agitated in session and discussed reasons behind agitation and irritability. Psychoeducation on depression and depressive symptoms. Pt led discussion on her xhusband who committed suicide 20 years ago. She was tearful in her discussion. Will continue processing next session. Pt completed PHQ9 screening tool.   Suicidal/Homicidal: Nowithout intent/plan  Therapist Response:  Assessed pt's current functioning and reviewed progress. Assist pt with  psychoeducation on depression, agitation and irritability, xhusband, PHQ9. Assisted pt processing for the management of her stressors.  Plan: Return again in 1 week to continue discussion of her xhusband who committed suicide 20+ years ago, meditation, calendar, neurologist, meds  Diagnosis: Axis I: MDD        Jenkins Rouge, LCAS 05/09/2018

## 2018-05-15 ENCOUNTER — Ambulatory Visit (HOSPITAL_COMMUNITY): Payer: Self-pay | Admitting: Licensed Clinical Social Worker

## 2018-05-19 ENCOUNTER — Ambulatory Visit: Payer: Medicaid Other | Admitting: Neurology

## 2018-05-19 ENCOUNTER — Encounter: Payer: Self-pay | Admitting: Neurology

## 2018-05-19 VITALS — BP 134/88 | HR 91 | Ht 66.5 in | Wt 263.0 lb

## 2018-05-19 DIAGNOSIS — G250 Essential tremor: Secondary | ICD-10-CM

## 2018-05-19 NOTE — Patient Instructions (Addendum)
I am glad you are feeling better. Continue primidone 300mg  at bedtime Follow up in 6 months

## 2018-05-19 NOTE — Progress Notes (Signed)
NEUROLOGY FOLLOW UP OFFICE NOTE  Erin Bradford [id number]  HISTORY OF PRESENT ILLNESS: Erin Bradford is a 47 year old right-handed female with hypertension, iron-deficiency anemia, prediabetes, mild chronic kidney disease, palpitations, anxiety who follows up for essential tremor.   UPDATE: She is taking primidone 300mg  at bedtime.  She still notes tremors but overall they remain well-controlled.  To evaluate memory deficits, B12 and TSH were checked, which were 663 and 1.05 respectively.  She has since started seeing a therapist and is doing much better.  She realizes the memory deficits were related to depression.     HISTORY: Beginning in 2017, she reports tremor in both hands.  She only notices it when she is performing a task, but not at rest.  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  She also reports difficulty pouring liquids in a spoon or using utensils.  It does not affect writing.  She denies any triggers or anything that improves symptoms.  She thinks it has gotten a little worse over the past year.  She denies starting any new medication prior to onset of symptoms.  She denies family history of tremor.  TSH was 0.92.    She endorses short term memory problems.  She frequently is experiencing word-finding difficulties.  This has been ongoing for about a year but has gotten worse over the past 3 months.  Other than the primidone, she has not had any changes or new medications.  She has not had trouble with finances or disorientation on familiar routes.    PAST MEDICAL HISTORY: Past Medical History:  Diagnosis Date  . Abnormal laboratory test   . Allergy   . Anemia   . Anxiety   . Arthritis   . Blood transfusion without reported diagnosis   . BMI 39.0-39.9,adult   . Chronic hypokalemia   . Chronic kidney disease    mild  . Depression   . GERD (gastroesophageal reflux disease)   . Hypertension   . Insomnia   . Low back pain   . Obesity    . Occasional tremors   . Palpitations    dx with tachycardia   . PONV (postoperative nausea and vomiting)   . Tiredness     MEDICATIONS: Current Outpatient Medications on File Prior to Visit  Medication Sig Dispense Refill  . ACCU-CHEK AVIVA PLUS test strip USE TO CHECK BLOOD SUGAR TWICE WEEKLY  11  . buPROPion (WELLBUTRIN XL) 300 MG 24 hr tablet Take 300 mg by mouth daily.    . carvedilol (COREG) 12.5 MG tablet Take 1 & 1/2 tablets (18.75 mg total) by mouth twice daily 270 tablet 3  . cetirizine (ZYRTEC) 10 MG tablet Take 10 mg by mouth at bedtime as needed for allergies.     . Fluocinolone Acetonide Body 0.01 % OIL Place 2 drops into both ears 2 (two) times daily.     . furosemide (LASIX) 20 MG tablet Take 20 mg by mouth 2 (two) times daily.     Marland Kitchen gabapentin (NEURONTIN) 300 MG capsule Take 300 mg by mouth 3 (three) times daily.     . Glucose Blood (BLOOD GLUCOSE TEST STRIPS) STRP 2 (two) times a week.    Marland Kitchen HYDROcodone-acetaminophen (NORCO) 10-325 MG tablet Take 1 tablet by mouth 4 (four) times daily.     Marland Kitchen losartan (COZAAR) 100 MG tablet Take 100 mg by mouth daily.    . methocarbamol (ROBAXIN) 750 MG tablet Take 750 mg  by mouth 3 (three) times daily.  5  . primidone (MYSOLINE) 50 MG tablet TAKE 6 TABLETS(300 MG) BY MOUTH AT BEDTIME 180 tablet 0  . sertraline (ZOLOFT) 50 MG tablet Take 1 tablet by mouth daily.    Marland Kitchen spironolactone (ALDACTONE) 50 MG tablet Take 50 mg by mouth 2 (two) times daily.    Marland Kitchen tiZANidine (ZANAFLEX) 4 MG tablet Take 4 mg by mouth 2 (two) times daily.     Marland Kitchen zolpidem (AMBIEN) 10 MG tablet TAKE 1 TABLET BY MOUTH EVERY NIGHT AT BEDTIME AS NEEDED FOR INSOMNIA 15 tablet 0   No current facility-administered medications on file prior to visit.     ALLERGIES: Allergies  Allergen Reactions  . Ace Inhibitors Anaphylaxis and Swelling    Swelling of tongue.    FAMILY HISTORY: Family History  Problem Relation Age of Onset  . Diabetes Mother   . Hypertension Mother    . Hyperlipidemia Mother   . Sarcoidosis Mother   . Kidney disease Father   . Atrial fibrillation Sister   . Kidney disease Maternal Grandmother   . Pancreatic cancer Maternal Grandmother   . Diabetes Maternal Grandmother   . Hypertension Maternal Grandmother   . Cancer Maternal Grandfather   . Colon cancer Neg Hx   . Colon polyps Neg Hx   . Esophageal cancer Neg Hx   . Stomach cancer Neg Hx   . Rectal cancer Neg Hx     SOCIAL HISTORY: Social History   Socioeconomic History  . Marital status: Widowed    Spouse name: Not on file  . Number of children: Not on file  . Years of education: Not on file  . Highest education level: Not on file  Occupational History  . Not on file  Social Needs  . Financial resource strain: Not on file  . Food insecurity:    Worry: Not on file    Inability: Not on file  . Transportation needs:    Medical: Not on file    Non-medical: Not on file  Tobacco Use  . Smoking status: Never Smoker  . Smokeless tobacco: Never Used  Substance and Sexual Activity  . Alcohol use: No  . Drug use: No  . Sexual activity: Never    Birth control/protection: Condom  Lifestyle  . Physical activity:    Days per week: Not on file    Minutes per session: Not on file  . Stress: Not on file  Relationships  . Social connections:    Talks on phone: Not on file    Gets together: Not on file    Attends religious service: Not on file    Active member of club or organization: Not on file    Attends meetings of clubs or organizations: Not on file    Relationship status: Not on file  . Intimate partner violence:    Fear of current or ex partner: Not on file    Emotionally abused: Not on file    Physically abused: Not on file    Forced sexual activity: Not on file  Other Topics Concern  . Not on file  Social History Narrative  . Not on file    REVIEW OF SYSTEMS: Constitutional: No fevers, chills, or sweats, no generalized fatigue, change in appetite Eyes:  No visual changes, double vision, eye pain Ear, nose and throat: No hearing loss, ear pain, nasal congestion, sore throat Cardiovascular: No chest pain, palpitations Respiratory:  No shortness of breath at rest or with  exertion, wheezes GastrointestinaI: No nausea, vomiting, diarrhea, abdominal pain, fecal incontinence Genitourinary:  No dysuria, urinary retention or frequency Musculoskeletal:  No neck pain, back pain Integumentary: No rash, pruritus, skin lesions Neurological: as above Psychiatric: No depression, insomnia, anxiety Endocrine: No palpitations, fatigue, diaphoresis, mood swings, change in appetite, change in weight, increased thirst Hematologic/Lymphatic:  No purpura, petechiae. Allergic/Immunologic: no itchy/runny eyes, nasal congestion, recent allergic reactions, rashes  PHYSICAL EXAM: Vitals:   05/19/18 1143  BP: 134/88  Pulse: 91  SpO2: 98%   General: No acute distress.  Patient appears well-groomed.   Head:  Normocephalic/atraumatic Eyes:  Fundi examined but not visualized Neurological Exam: alert and oriented to person, place, and time. Attention span and concentration intact, recent and remote memory intact, fund of knowledge intact.  Speech fluent and not dysarthric, language intact.  CN II-XII intact. Bulk and tone normal, muscle strength 5/5 throughout.  Sensation to light touch  intact.  Deep tendon reflexes 2+ throughout.  Finger to nose testing intact.  Gait normal, Romberg negative.  IMPRESSION: Essential tremor Morbid obesity (BMI 41.81 kg/m2)  PLAN: Continue primidone 300mg  at bedtime Weight loss Follow up in 6 months.  16 minutes spent face to face with patient, over 50% spent discussing management.  Metta Clines, DO

## 2018-05-24 ENCOUNTER — Ambulatory Visit (INDEPENDENT_AMBULATORY_CARE_PROVIDER_SITE_OTHER): Payer: Medicaid Other | Admitting: Psychiatry

## 2018-05-24 ENCOUNTER — Encounter (HOSPITAL_COMMUNITY): Payer: Self-pay | Admitting: Psychiatry

## 2018-05-24 VITALS — BP 160/100 | HR 84 | Ht 65.5 in | Wt 260.0 lb

## 2018-05-24 DIAGNOSIS — F330 Major depressive disorder, recurrent, mild: Secondary | ICD-10-CM

## 2018-05-24 DIAGNOSIS — F419 Anxiety disorder, unspecified: Secondary | ICD-10-CM

## 2018-05-24 DIAGNOSIS — F5101 Primary insomnia: Secondary | ICD-10-CM | POA: Diagnosis not present

## 2018-05-24 MED ORDER — BUPROPION HCL ER (XL) 300 MG PO TB24: 300.0000 mg | ORAL_TABLET | Freq: Every day | ORAL | 1 refills | Status: DC

## 2018-05-24 MED ORDER — ZOLPIDEM TARTRATE 10 MG PO TABS: ORAL_TABLET | ORAL | 1 refills | Status: DC

## 2018-05-24 MED ORDER — LAMOTRIGINE 25 MG PO TABS: ORAL_TABLET | ORAL | 1 refills | Status: DC

## 2018-05-24 NOTE — Progress Notes (Signed)
BH MD/PA/NP OP Progress Note  2/83/1517 6:16 PM Erin Bradford  MRN:  [id number]  Chief Complaint: I am sleeping better.  I am not taking Thorazine and Lunesta.  HPI: Patient came for her follow-up appointment.  She is now taking Ambien 10 mg at bedtime.  We tried Vistaril, trazodone, Lunesta, Thorazine with poor outcome.  She is seeing Charolotte Eke for therapy.  She still feel some time irritable, depressed but reported Wellbutrin working very well for her.  She is taking Zoloft prescribed by primary care physician but she continued to have sweating and she does not like the side effects of Zoloft.  She has no tremors, shakes or any EPS.  She lives with her mother and her 64 year old daughter.  She is excited because her 59 year old daughter accepted at [employer] middle college program.  Recently she was given iron infusion and her hemoglobin A1c and hematocrit is improved.  She started working and she lost 10 pounds since the last visit.  She joined bariatric program from [place].  Patient like to try a different medication other than Zoloft because of the sweating.  Patient denies drinking or using any illegal substances.  Visit Diagnosis:    ICD-10-CM   1. MDD (major depressive disorder), recurrent episode, mild (HCC) F33.0 buPROPion (WELLBUTRIN XL) 300 MG 24 hr tablet    lamoTRIgine (LAMICTAL) 25 MG tablet  2. Primary insomnia F51.01 zolpidem (AMBIEN) 10 MG tablet    Past Psychiatric History: Reviewed. Patient denies any history of psychiatric inpatient treatment or any suicidal attempt.  In the past she tried Prozac which works for few hours and then it stopped working.  She tried Vistaril, trazodone, Thorazine and Lunesta for insomnia with poor outcome.  She was recommended to do intensive outpatient program at [facility] but she never went there.  Past Medical History:  Past Medical History:  Diagnosis Date  . Abnormal laboratory test   . Allergy   . Anemia   . Anxiety   .  Arthritis   . Blood transfusion without reported diagnosis   . BMI 39.0-39.9,adult   . Chronic hypokalemia   . Chronic kidney disease    mild  . Depression   . GERD (gastroesophageal reflux disease)   . Hypertension   . Insomnia   . Low back pain   . Obesity   . Occasional tremors   . Palpitations    dx with tachycardia   . PONV (postoperative nausea and vomiting)   . Tiredness     Past Surgical History:  Procedure Laterality Date  . CESAREAN SECTION    . TOOTH EXTRACTION    . TOTAL LAPAROSCOPIC HYSTERECTOMY WITH SALPINGECTOMY Bilateral 10/06/2017   Procedure: ATTEMPTED HYSTERECTOMY TOTAL LAPAROSCOPIC WITH SALPINGECTOMY CONVERTED TO OPEN ABDOMINAL TOTAL HYSTERECTOMY WITH SALPINGECTOMY;  Surgeon: Sherlyn Hay, DO;  Location: [facility];  Service: Gynecology;  Laterality: Bilateral;    Family Psychiatric History: Reviewed.  Family History:  Family History  Problem Relation Age of Onset  . Diabetes Mother   . Hypertension Mother   . Hyperlipidemia Mother   . Sarcoidosis Mother   . Kidney disease Father   . Atrial fibrillation Sister   . Kidney disease Maternal Grandmother   . Pancreatic cancer Maternal Grandmother   . Diabetes Maternal Grandmother   . Hypertension Maternal Grandmother   . Cancer Maternal Grandfather   . Colon cancer Neg Hx   . Colon polyps Neg Hx   . Esophageal cancer Neg Hx   .  Stomach cancer Neg Hx   . Rectal cancer Neg Hx     Social History:  Social History   Socioeconomic History  . Marital status: Widowed    Spouse name: Not on file  . Number of children: Not on file  . Years of education: Not on file  . Highest education level: Not on file  Occupational History  . Not on file  Social Needs  . Financial resource strain: Not on file  . Food insecurity:    Worry: Not on file    Inability: Not on file  . Transportation needs:    Medical: Not on file    Non-medical: Not on file  Tobacco Use  . Smoking status:  Never Smoker  . Smokeless tobacco: Never Used  Substance and Sexual Activity  . Alcohol use: No  . Drug use: No  . Sexual activity: Never    Birth control/protection: Condom  Lifestyle  . Physical activity:    Days per week: Not on file    Minutes per session: Not on file  . Stress: Not on file  Relationships  . Social connections:    Talks on phone: Not on file    Gets together: Not on file    Attends religious service: Not on file    Active member of club or organization: Not on file    Attends meetings of clubs or organizations: Not on file    Relationship status: Not on file  Other Topics Concern  . Not on file  Social History Narrative  . Not on file    Allergies:  Allergies  Allergen Reactions  . Ace Inhibitors Anaphylaxis and Swelling    Swelling of tongue.    Metabolic Disorder Labs: No results found for: HGBA1C, MPG No results found for: PROLACTIN No results found for: CHOL, TRIG, HDL, CHOLHDL, VLDL, LDLCALC Lab Results  Component Value Date   TSH 1.05 12/16/2017   TSH 0.92 02/18/2017    Therapeutic Level Labs: Recent Results (from the past 2160 hour(s))  Iron and TIBC     Status: None   Collection Time: 04/20/18  1:00 PM  Result Value Ref Range   Iron 71 41 - 142 ug/dL   TIBC 293 236 - 444 ug/dL   Saturation Ratios 24 21 - 57 %   UIBC 221 ug/dL    Comment: Performed at [facility], [place] [address], [place], [place] [address]  Ferritin     Status: None   Collection Time: 04/20/18  1:00 PM  Result Value Ref Range   Ferritin 141 9 - 269 ng/mL    Comment: Performed at [facility], [place] [address], [place], [place] [address]  CBC with Differential ([facility] Only)     Status: None   Collection Time: 04/20/18  1:01 PM  Result Value Ref Range   WBC Count 6.4 3.9 - 10.3 K/uL   RBC 4.50 3.70 - 5.45 MIL/uL   Hemoglobin 13.3 11.6 - 15.9 g/dL   HCT 39.8 34.8 - 46.6 %   MCV 88.4 79.5 - 101.0 fL   MCH  29.6 25.1 - 34.0 pg   MCHC 33.4 31.5 - 36.0 g/dL   RDW 13.2 11.2 - 14.5 %   Platelet Count 336 145 - 400 K/uL   Neutrophils Relative % 50 %   Neutro Abs 3.3 1.5 - 6.5 K/uL   Lymphocytes Relative 37 %   Lymphs Abs 2.4 0.9 - 3.3 K/uL  Monocytes Relative 8 %   Monocytes Absolute 0.5 0.1 - 0.9 K/uL   Eosinophils Relative 4 %   Eosinophils Absolute 0.3 0.0 - 0.5 K/uL   Basophils Relative 1 %   Basophils Absolute 0.0 0.0 - 0.1 K/uL    Comment: Performed at [facility], [place] [address], [place], [place] [address]   No results found for: LITHIUM No results found for: VALPROATE No components found for:  CBMZ  Current Medications: Current Outpatient Medications  Medication Sig Dispense Refill  . ACCU-CHEK AVIVA PLUS test strip USE TO CHECK BLOOD SUGAR TWICE WEEKLY  11  . buPROPion (WELLBUTRIN XL) 300 MG 24 hr tablet Take 300 mg by mouth daily.    . carvedilol (COREG) 12.5 MG tablet Take 1 & 1/2 tablets (18.75 mg total) by mouth twice daily 270 tablet 3  . cetirizine (ZYRTEC) 10 MG tablet Take 10 mg by mouth at bedtime as needed for allergies.     . Fluocinolone Acetonide Body 0.01 % OIL Place 2 drops into both ears 2 (two) times daily.     . furosemide (LASIX) 20 MG tablet Take 20 mg by mouth 2 (two) times daily.     Marland Kitchen gabapentin (NEURONTIN) 300 MG capsule Take 300 mg by mouth 3 (three) times daily.     . Glucose Blood (BLOOD GLUCOSE TEST STRIPS) STRP 2 (two) times a week.    Marland Kitchen HYDROcodone-acetaminophen (NORCO) 10-325 MG tablet Take 1 tablet by mouth 4 (four) times daily.     Marland Kitchen losartan (COZAAR) 100 MG tablet Take 100 mg by mouth daily.    . methocarbamol (ROBAXIN) 750 MG tablet Take 750 mg by mouth 3 (three) times daily.  5  . phentermine (ADIPEX-P) 37.5 MG tablet Take 37.5 mg by mouth daily before breakfast.    . primidone (MYSOLINE) 50 MG tablet TAKE 6 TABLETS(300 MG) BY MOUTH AT BEDTIME 180 tablet 0  . spironolactone (ALDACTONE) 50 MG tablet Take 50 mg by mouth  2 (two) times daily.    Marland Kitchen zolpidem (AMBIEN) 10 MG tablet TAKE 1 TABLET BY MOUTH EVERY NIGHT AT BEDTIME AS NEEDED FOR INSOMNIA 15 tablet 0  . sertraline (ZOLOFT) 50 MG tablet Take 1 tablet by mouth daily.    Marland Kitchen tiZANidine (ZANAFLEX) 4 MG tablet Take 4 mg by mouth 2 (two) times daily.      No current facility-administered medications for this visit.      Musculoskeletal: Strength & Muscle Tone: within normal limits Gait & Station: normal Patient leans: N/A  Psychiatric Specialty Exam: Review of Systems  Constitutional: Positive for weight loss.       Excessive sweating    Blood pressure (!) 160/100, pulse 84, height 5' 5.5" (1.664 m), weight 260 lb (117.9 kg), last menstrual period 09/17/2017, SpO2 93 %.Body mass index is 42.61 kg/m.  General Appearance: Casual  Eye Contact:  Good  Speech:  Clear and Coherent  Volume:  Normal  Mood:  Euthymic  Affect:  Congruent  Thought Process:  Goal Directed  Orientation:  Full (Time, Place, and Person)  Thought Content: Logical   Suicidal Thoughts:  No  Homicidal Thoughts:  No  Memory:  Immediate;   Good Recent;   Good Remote;   Good  Judgement:  Good  Insight:  Good  Psychomotor Activity:  Mild tremors in her hand  Concentration:  Concentration: Good and Attention Span: Good  Recall:  Good  Fund of Knowledge: Good  Language: Good  Akathisia:  No  Handed:  Right  AIMS (if indicated): not done  Assets:  Communication Skills Desire for Improvement Housing Social Support  ADL's:  Intact  Cognition: WNL  Sleep:  Good   Screenings: PHQ2-9     Counselor from 05/09/2018 in [place]  PHQ-2 Total Score  6  PHQ-9 Total Score  14       Assessment and Plan: Major depressive disorder, recurrent.  Anxiety disorder NOS.  Patient doing better on Ambien.  She is sleeping better.  We discussed hypnotic abuse and tolerance.  Recommended to take Ambien every other night so it work longer.  I will  discontinue Zoloft as patient complaining of sweating.  We will try Lamictal 25 mg daily for 1 week and then 50 mg daily.  Discussed if she develops rash that she need to stop the medication immediately.  Continue Wellbutrin XL 300 mg daily.  Continue CBT with Charolotte Eke.  I also reviewed blood work results which was done by primary care physician.  Her hemoglobin and hematocrit is improved.  Her energy level is improved.  Recommended to call us back if she has any question or any concern.  Discussed healthy lifestyle and encouraged to continue weight loss program.  Follow-up in 2 months.   [name], MD 05/24/2018, 2:12 PM

## 2018-05-29 ENCOUNTER — Ambulatory Visit (HOSPITAL_COMMUNITY): Payer: Self-pay | Admitting: Licensed Clinical Social Worker

## 2018-05-29 ENCOUNTER — Other Ambulatory Visit: Payer: Self-pay | Admitting: Neurology

## 2018-06-05 ENCOUNTER — Encounter (HOSPITAL_COMMUNITY): Payer: Self-pay | Admitting: Licensed Clinical Social Worker

## 2018-06-05 ENCOUNTER — Ambulatory Visit (INDEPENDENT_AMBULATORY_CARE_PROVIDER_SITE_OTHER): Payer: Medicaid Other | Admitting: Licensed Clinical Social Worker

## 2018-06-05 DIAGNOSIS — F330 Major depressive disorder, recurrent, mild: Secondary | ICD-10-CM

## 2018-06-05 NOTE — Progress Notes (Signed)
   THERAPIST PROGRESS NOTE  Session Time: 1:10-2pm  Participation Level: Active  Behavioral Response: Casual/Alert/Euthymic  Type of Therapy: Individual Therapy  Treatment Goals addressed: Coping  Interventions: CBT  Summary: Erin Bradford is a 47 y.o. female who presents for her individual counseling session. Pt discussed her psychiatric symptoms and current life events. Pt reports her moods are somewhat stabilized. Pt saw Dr. Adele Schilder who did not change any of her medications. Pt has some issues currently with her daughter who lives in [place]. Asked open ended questions and used empathic reflection. Taught pt relationship circles. Role played using boundaries with daughter. Reviewed and updated tx plan.    Suicidal/Homicidal: Nowithout intent/plan  Therapist Response:  Assessed pt's current functioning and reviewed progress. Assist pt with  Relationships, boundaries, relationship circles, tx plan update and review. Assisted pt processing for the management of her stressors.  Plan: Return again in 1 week to continue discussion of  Daughters.  Diagnosis: Axis I: MDD        MACKENZIE,LISBETH S, LCAS 06/05/2018

## 2018-06-06 ENCOUNTER — Ambulatory Visit (INDEPENDENT_AMBULATORY_CARE_PROVIDER_SITE_OTHER): Payer: Medicaid Other | Admitting: Licensed Clinical Social Worker

## 2018-06-06 DIAGNOSIS — F330 Major depressive disorder, recurrent, mild: Secondary | ICD-10-CM

## 2018-06-07 ENCOUNTER — Encounter (HOSPITAL_COMMUNITY): Payer: Self-pay | Admitting: Licensed Clinical Social Worker

## 2018-06-07 NOTE — Progress Notes (Signed)
Daily Group Progress Note Program:  OP Evening Group   Group Time: 5:30-6:30pm  Participation Level: Active  Behavioral Response: Appropriate  Type of Therapy:  Psychoeducation/Group Process  Summary of Progress:  Pt participated in a discussion on family relationships and how  families who nurture relationships tend to communicate better in times of stress. Pt was encouraged to continue using effective communication skills in times of stress.  Alver Fisher, LCAS

## 2018-06-12 ENCOUNTER — Ambulatory Visit (HOSPITAL_COMMUNITY): Payer: Self-pay | Admitting: Licensed Clinical Social Worker

## 2018-06-13 ENCOUNTER — Encounter (HOSPITAL_COMMUNITY): Payer: Self-pay | Admitting: Licensed Clinical Social Worker

## 2018-06-13 ENCOUNTER — Ambulatory Visit (INDEPENDENT_AMBULATORY_CARE_PROVIDER_SITE_OTHER): Payer: Medicaid Other | Admitting: Licensed Clinical Social Worker

## 2018-06-13 DIAGNOSIS — F330 Major depressive disorder, recurrent, mild: Secondary | ICD-10-CM

## 2018-06-13 NOTE — Progress Notes (Signed)
Daily Group Progress Note   Program: OP Evening Group   Group Time: 5:30-6:30pm  Participation Level: Active  Behavioral Response: Appropriate  Type of Therapy:  Psychoeducation/Therapy  Summary of Progress: Pt participated a discussion on stress management, discovering not all stress is bad. Pt identified that stress response is a tool used to increase the odds of overcoming obstacles, before becoming too intense or problematic.  Pt was encouraged to continue using her stress management skills.  Jenkins Rouge, LCAS

## 2018-06-14 ENCOUNTER — Telehealth: Payer: Self-pay | Admitting: *Deleted

## 2018-06-14 ENCOUNTER — Encounter (HOSPITAL_COMMUNITY): Payer: Self-pay | Admitting: Licensed Clinical Social Worker

## 2018-06-14 ENCOUNTER — Encounter: Payer: Self-pay | Admitting: Oncology

## 2018-06-14 ENCOUNTER — Telehealth: Payer: Self-pay | Admitting: Oncology

## 2018-06-14 ENCOUNTER — Ambulatory Visit: Payer: Self-pay | Admitting: Family Medicine

## 2018-06-14 ENCOUNTER — Other Ambulatory Visit: Payer: Self-pay | Admitting: Oncology

## 2018-06-14 ENCOUNTER — Ambulatory Visit (INDEPENDENT_AMBULATORY_CARE_PROVIDER_SITE_OTHER): Payer: Medicaid Other | Admitting: Licensed Clinical Social Worker

## 2018-06-14 DIAGNOSIS — D500 Iron deficiency anemia secondary to blood loss (chronic): Secondary | ICD-10-CM

## 2018-06-14 DIAGNOSIS — F330 Major depressive disorder, recurrent, mild: Secondary | ICD-10-CM

## 2018-06-14 NOTE — Telephone Encounter (Signed)
Patient calling to say she thinks she needs an iron infusion. Doesn't think she can   wait until her next appt 07/25/18

## 2018-06-14 NOTE — Telephone Encounter (Signed)
Please send a message to scheduling to get her labs done this week. Depends on the results, will arrange for IV iron if needed.

## 2018-06-14 NOTE — Telephone Encounter (Signed)
rec'd message that patient called for an earlier appt and has not had a return call, states she is anemic. Needs iron infusion. Called patient, left VM to call desk nurse. Gave  my fax # so she may fax any recent labs, indicating anemia.

## 2018-06-14 NOTE — Progress Notes (Signed)
   THERAPIST PROGRESS NOTE  Session Time: 4:10-5pm  Participation Level: Active  Behavioral Response: Casual/Alert/Anxious  Type of Therapy: Individual Therapy  Treatment Goals addressed: Coping  Interventions: CBT  Summary: Erin Bradford is a 47 y.o. female who presents for her individual counseling session. Pt discussed her psychiatric symptoms and current life events. Pt presented anxious and tearful for her session. Pt wanted to talk about her sister who lives in [place]. They are currently estranged. When pt moved from [place] to [place] she lived with her sister. They had a falling out and have not spoken in several years. Mapped their relationship with pt.showing her the development of their relationship at a young age to current. Pt was able to see how she had played into some of the pitfalls and mishaps in their relationship. Asked open ended questions and used empathic reflection.      Suicidal/Homicidal: Nowithout intent/plan  Therapist Response:  Assessed pt's current functioning and reviewed progress. Assist pt with sister relationship. Assisted pt processing for the management of her stressors.  Plan: Return again in 1 week   Diagnosis: Axis I: MDD        Jenkins Rouge, LCAS 06/14/2018

## 2018-06-14 NOTE — Telephone Encounter (Signed)
Scheduled lab appt per 6/19 sch msg - left vm for pt w/ appts.

## 2018-06-16 ENCOUNTER — Inpatient Hospital Stay: Payer: Medicaid Other | Attending: Oncology

## 2018-06-16 ENCOUNTER — Telehealth: Payer: Self-pay | Admitting: *Deleted

## 2018-06-16 DIAGNOSIS — D500 Iron deficiency anemia secondary to blood loss (chronic): Secondary | ICD-10-CM

## 2018-06-16 DIAGNOSIS — Z90710 Acquired absence of both cervix and uterus: Secondary | ICD-10-CM | POA: Insufficient documentation

## 2018-06-16 DIAGNOSIS — N920 Excessive and frequent menstruation with regular cycle: Secondary | ICD-10-CM | POA: Insufficient documentation

## 2018-06-16 LAB — CBC WITH DIFFERENTIAL (CANCER CENTER ONLY)
Basophils Absolute: 0 10*3/uL (ref 0.0–0.1)
Basophils Relative: 1 %
EOS ABS: 0.3 10*3/uL (ref 0.0–0.5)
Eosinophils Relative: 4 %
HEMATOCRIT: 38.8 % (ref 34.8–46.6)
HEMOGLOBIN: 13 g/dL (ref 11.6–15.9)
LYMPHS ABS: 2.6 10*3/uL (ref 0.9–3.3)
Lymphocytes Relative: 40 %
MCH: 30.3 pg (ref 25.1–34.0)
MCHC: 33.5 g/dL (ref 31.5–36.0)
MCV: 90.4 fL (ref 79.5–101.0)
MONOS PCT: 10 %
Monocytes Absolute: 0.7 10*3/uL (ref 0.1–0.9)
NEUTROS ABS: 3 10*3/uL (ref 1.5–6.5)
NEUTROS PCT: 45 %
Platelet Count: 341 10*3/uL (ref 145–400)
RBC: 4.29 MIL/uL (ref 3.70–5.45)
RDW: 13.2 % (ref 11.2–14.5)
WBC Count: 6.6 10*3/uL (ref 3.9–10.3)

## 2018-06-16 LAB — IRON AND TIBC
Iron: 60 ug/dL (ref 41–142)
Saturation Ratios: 20 % — ABNORMAL LOW (ref 21–57)
TIBC: 304 ug/dL (ref 236–444)
UIBC: 243 ug/dL

## 2018-06-16 LAB — FERRITIN: FERRITIN: 161 ng/mL (ref 9–269)

## 2018-06-16 NOTE — Telephone Encounter (Signed)
-----   Message from Wyatt Portela, MD sent at 06/16/2018  4:01 PM EDT ----- Please let her know her iron and hgb are normal No need for iron infusion at this time.

## 2018-06-16 NOTE — Telephone Encounter (Signed)
Spoke with patient, per dr Alen Blew, iron and hgb are both normal and no iron infusion is needed.

## 2018-06-18 ENCOUNTER — Encounter: Payer: Self-pay | Admitting: Oncology

## 2018-06-19 ENCOUNTER — Ambulatory Visit (HOSPITAL_COMMUNITY): Payer: Self-pay | Admitting: Licensed Clinical Social Worker

## 2018-06-22 ENCOUNTER — Ambulatory Visit: Payer: Self-pay | Admitting: Family Medicine

## 2018-06-22 ENCOUNTER — Telehealth: Payer: Self-pay | Admitting: *Deleted

## 2018-06-22 ENCOUNTER — Other Ambulatory Visit: Payer: Self-pay | Admitting: Oncology

## 2018-06-22 NOTE — Telephone Encounter (Signed)
Left message to call [place] back to review lab results.

## 2018-06-22 NOTE — Telephone Encounter (Signed)
Let her know that we will arrange IV iron infusion next week.

## 2018-06-22 NOTE — Telephone Encounter (Signed)
Returned patient's phone call regarding iron studies. Patient stated,"even though my iron level is in range I think it needs to be higher than 60. Two months ago it was 71. Can you ask Dr. Alen Blew if I can have an iron infusion? I see Dr. Alen Blew on July 9th. I told her I would pass along the message. Return umber is [phone].

## 2018-06-23 ENCOUNTER — Telehealth: Payer: Self-pay | Admitting: Oncology

## 2018-06-23 ENCOUNTER — Other Ambulatory Visit (HOSPITAL_COMMUNITY): Payer: Self-pay | Admitting: Psychiatry

## 2018-06-23 DIAGNOSIS — F330 Major depressive disorder, recurrent, mild: Secondary | ICD-10-CM

## 2018-06-23 NOTE — Telephone Encounter (Signed)
Called pt re appts added per 6/27 sch msg - left vm for pt re appts that were added.

## 2018-06-27 ENCOUNTER — Ambulatory Visit (HOSPITAL_COMMUNITY): Payer: Self-pay | Admitting: Licensed Clinical Social Worker

## 2018-07-01 ENCOUNTER — Inpatient Hospital Stay: Payer: Medicaid Other | Attending: Oncology

## 2018-07-01 VITALS — BP 126/75 | HR 74 | Temp 98.8°F | Resp 18

## 2018-07-01 DIAGNOSIS — R1031 Right lower quadrant pain: Secondary | ICD-10-CM | POA: Diagnosis not present

## 2018-07-01 DIAGNOSIS — Z79899 Other long term (current) drug therapy: Secondary | ICD-10-CM | POA: Insufficient documentation

## 2018-07-01 DIAGNOSIS — D500 Iron deficiency anemia secondary to blood loss (chronic): Secondary | ICD-10-CM | POA: Diagnosis not present

## 2018-07-01 DIAGNOSIS — K5900 Constipation, unspecified: Secondary | ICD-10-CM | POA: Diagnosis not present

## 2018-07-01 DIAGNOSIS — Z90710 Acquired absence of both cervix and uterus: Secondary | ICD-10-CM | POA: Insufficient documentation

## 2018-07-01 DIAGNOSIS — N920 Excessive and frequent menstruation with regular cycle: Secondary | ICD-10-CM | POA: Diagnosis not present

## 2018-07-01 MED ORDER — SODIUM CHLORIDE 0.9 % IV SOLN
Freq: Once | INTRAVENOUS | Status: AC
  Administered 2018-07-01: 10:00:00 via INTRAVENOUS

## 2018-07-01 MED ORDER — FERUMOXYTOL INJECTION 510 MG/17 ML
510.0000 mg | Freq: Once | INTRAVENOUS | Status: AC
  Administered 2018-07-01: 510 mg via INTRAVENOUS
  Filled 2018-07-01: qty 17

## 2018-07-01 NOTE — Patient Instructions (Signed)

## 2018-07-03 ENCOUNTER — Ambulatory Visit (HOSPITAL_COMMUNITY)
Admission: RE | Admit: 2018-07-03 | Discharge: 2018-07-03 | Disposition: A | Discharge status: Home / Self Care | Payer: Medicaid Other | Source: Ambulatory Visit | Attending: Obstetrics and Gynecology | Admitting: Obstetrics and Gynecology

## 2018-07-03 ENCOUNTER — Other Ambulatory Visit (HOSPITAL_COMMUNITY): Payer: Self-pay | Admitting: Obstetrics and Gynecology

## 2018-07-03 DIAGNOSIS — R1031 Right lower quadrant pain: Secondary | ICD-10-CM | POA: Diagnosis not present

## 2018-07-04 ENCOUNTER — Ambulatory Visit (HOSPITAL_COMMUNITY): Payer: Self-pay | Admitting: Licensed Clinical Social Worker

## 2018-07-04 ENCOUNTER — Telehealth: Payer: Self-pay

## 2018-07-04 ENCOUNTER — Inpatient Hospital Stay (HOSPITAL_BASED_OUTPATIENT_CLINIC_OR_DEPARTMENT_OTHER): Payer: Medicaid Other | Admitting: Oncology

## 2018-07-04 ENCOUNTER — Encounter: Payer: Self-pay | Admitting: Oncology

## 2018-07-04 VITALS — BP 173/87 | HR 76 | Temp 98.8°F | Resp 17 | Ht 65.5 in | Wt 267.0 lb

## 2018-07-04 DIAGNOSIS — D500 Iron deficiency anemia secondary to blood loss (chronic): Secondary | ICD-10-CM | POA: Diagnosis not present

## 2018-07-04 DIAGNOSIS — K5900 Constipation, unspecified: Secondary | ICD-10-CM

## 2018-07-04 DIAGNOSIS — Z79899 Other long term (current) drug therapy: Secondary | ICD-10-CM

## 2018-07-04 DIAGNOSIS — R1031 Right lower quadrant pain: Secondary | ICD-10-CM | POA: Diagnosis not present

## 2018-07-04 DIAGNOSIS — Z90710 Acquired absence of both cervix and uterus: Secondary | ICD-10-CM | POA: Diagnosis not present

## 2018-07-04 DIAGNOSIS — N920 Excessive and frequent menstruation with regular cycle: Secondary | ICD-10-CM

## 2018-07-04 NOTE — Telephone Encounter (Signed)
Printed avs and calender of upcoming appointment. Per 7/9 los 

## 2018-07-04 NOTE — Progress Notes (Signed)
Hematology and Oncology Follow Up Visit  Erin Bradford [id number] [birth year] 47 y.o. 07/04/2018 1:14 PM Medicine, [facility], [facility]*   Principle Diagnosis: 47 year old woman with iron deficiency anemia diagnosed in February 2018.  Her iron deficiency related to menstrual bleeding.    Prior Therapy:  She status post Feraheme infusion for a total of 1000 mg completed in February 2018. She is status post hysterectomy completed on 10/06/2017.  Current therapy: Intravenous iron as needed.  Interim History: Erin Bradford presents today for a follow-up.  Since last visit, she received intravenous iron on July 01, 6236 without complications.  She has reported slight increase in a right lower quadrant abdominal pain and currently under evaluation for that.  She does report issues with constipation but no diarrhea.  She denies any hematochezia or melena.  She does not report any vaginal bleeding.  Her energy and performance status remains unchanged.  She does not report any blurry vision, syncope or seizures.  She denies any alteration in mental status or confusion. She does not report any fevers, chills, weight loss. She does not report any chest pain, palpitation, orthopnea or leg edema. She does not report any cough, wheezing or hemoptysis.   She does not report any nausea, vomiting or abdominal pain.  Denies any constipation or diarrhea.  She does not report any dysuria or dysuria. She does not report any arthralgias or myalgias.. Remaining review of systems is negative.  Medications: I have reviewed the patient's current medications.  Current Outpatient Medications  Medication Sig Dispense Refill  . ACCU-CHEK AVIVA PLUS test strip USE TO CHECK BLOOD SUGAR TWICE WEEKLY  11  . buPROPion (WELLBUTRIN XL) 300 MG 24 hr tablet Take 1 tablet (300 mg total) by mouth daily. 30 tablet 1  . carvedilol (COREG) 12.5 MG tablet Take 1 & 1/2 tablets (18.75 mg total) by mouth  twice daily 270 tablet 3  . cetirizine (ZYRTEC) 10 MG tablet Take 10 mg by mouth at bedtime as needed for allergies.     . Fluocinolone Acetonide Body 0.01 % OIL Place 2 drops into both ears 2 (two) times daily.     . furosemide (LASIX) 20 MG tablet Take 20 mg by mouth 2 (two) times daily.     Marland Kitchen gabapentin (NEURONTIN) 300 MG capsule Take 300 mg by mouth 3 (three) times daily.     . Glucose Blood (BLOOD GLUCOSE TEST STRIPS) STRP 2 (two) times a week.    Marland Kitchen HYDROcodone-acetaminophen (NORCO) 10-325 MG tablet Take 1 tablet by mouth 4 (four) times daily.     Marland Kitchen lamoTRIgine (LAMICTAL) 25 MG tablet Take 1 tab daily for 1 week and than 2 tab daily 60 tablet 1  . losartan (COZAAR) 100 MG tablet Take 100 mg by mouth daily.    . methocarbamol (ROBAXIN) 750 MG tablet Take 750 mg by mouth 3 (three) times daily.  5  . phentermine (ADIPEX-P) 37.5 MG tablet Take 37.5 mg by mouth daily before breakfast.    . primidone (MYSOLINE) 50 MG tablet TAKE 6 TABLETS(300 MG) BY MOUTH AT BEDTIME 180 tablet 6  . spironolactone (ALDACTONE) 50 MG tablet Take 50 mg by mouth 2 (two) times daily.    Marland Kitchen zolpidem (AMBIEN) 10 MG tablet TAKE 1 TABLET BY MOUTH EVERY NIGHT AT BEDTIME AS NEEDED FOR INSOMNIA 30 tablet 1   No current facility-administered medications for this visit.      Allergies:  Allergies  Allergen Reactions  . Ace Inhibitors Anaphylaxis  and Swelling    Swelling of tongue.    Past Medical History, Surgical history, Social history, and Family History reviewed and unchanged.   Physical Exam: Blood pressure (!) 173/87, pulse 76, temperature 98.8 F (37.1 C), temperature source Oral, resp. rate 17, height 5' 5.5" (1.664 m), weight 267 lb (121.1 kg), last menstrual period 09/17/2017, SpO2 97 %.    ECOG:0  General appearance: Alert, awake woman without distress. Head: Atraumatic without abnormalities. Oropharynx: Mucous membranes are moist and pink. Eyes: Sclera anicteric. Lymph nodes: No lymphadenopathy  noted in the cervical, supraclavicular, or axillary nodes  Heart: Regular rate and rhythm without any murmurs or gallops.  No leg edema. Lung: Clear in all lung fields without any wheezes or dullness to percussion. Abdomin: Soft, without any rebound or guarding.  No shifting dullness or ascites. Musculoskeletal: No clubbing or cyanosis. Skin: No ecchymosis or petechiae.   Lab Results: Lab Results  Component Value Date   WBC 6.6 06/16/2018   HGB 13.0 06/16/2018   HCT 38.8 06/16/2018   MCV 90.4 06/16/2018   PLT 341 06/16/2018     Chemistry      Component Value Date/Time   NA 138 11/24/2017 1313   NA 138 02/04/2017 1438   K 4.9 11/24/2017 1313   K 3.8 02/04/2017 1438   CL 102 11/24/2017 1313   CO2 28 11/24/2017 1313   CO2 26 02/04/2017 1438   BUN 7 11/24/2017 1313   BUN 8.9 02/04/2017 1438   CREATININE 1.21 (H) 11/24/2017 1313   CREATININE 1.0 02/04/2017 1438      Component Value Date/Time   CALCIUM 9.3 11/24/2017 1313   CALCIUM 9.2 02/04/2017 1438   ALKPHOS 108 11/24/2017 1313   ALKPHOS 90 02/04/2017 1438   AST 52 (H) 11/24/2017 1313   AST 17 02/04/2017 1438   ALT 10 (L) 11/24/2017 1313   ALT 20 02/04/2017 1438   BILITOT 1.5 (H) 11/24/2017 1313   BILITOT <0.22 02/04/2017 1438          Impression and Plan:  47 year old woman with the following issues:  1. Iron deficiency anemia documented in 2018.  This is related to chronic blood losses due to menstrual cycles.  She has received intravenous iron intermittently because of poor tolerance to oral iron therapy.  Her hemoglobin in June 2019 was normal with mild decrease in her iron saturation.  She received Feraheme infusion last week without any complications.  At this time, I have recommended continued periodic monitoring and repeat iron infusion as needed.   2. Menorrhagia: No issues reported since her hysterectomy.  3. Colon cancer screening: I have recommended proceeding with a colonoscopy given her GI  symptoms.  4. Follow-up: Will be in 3 months to repeat iron studies.  15  minutes was spent with the patient face-to-face today.  More than 50% of time was dedicated to patient counseling, education and coordination of her care.    Zola Button, MD 7/9/20191:14 PM

## 2018-07-05 ENCOUNTER — Encounter: Payer: Self-pay | Admitting: Family Medicine

## 2018-07-05 ENCOUNTER — Other Ambulatory Visit: Payer: Self-pay | Admitting: Neurosurgery

## 2018-07-05 ENCOUNTER — Ambulatory Visit (INDEPENDENT_AMBULATORY_CARE_PROVIDER_SITE_OTHER): Payer: Medicaid Other | Admitting: Family Medicine

## 2018-07-05 VITALS — BP 140/90 | HR 66 | Temp 97.8°F | Ht 65.5 in | Wt 265.0 lb

## 2018-07-05 DIAGNOSIS — J302 Other seasonal allergic rhinitis: Secondary | ICD-10-CM

## 2018-07-05 DIAGNOSIS — Z7689 Persons encountering health services in other specified circumstances: Secondary | ICD-10-CM

## 2018-07-05 DIAGNOSIS — Z09 Encounter for follow-up examination after completed treatment for conditions other than malignant neoplasm: Secondary | ICD-10-CM | POA: Diagnosis not present

## 2018-07-05 DIAGNOSIS — M4316 Spondylolisthesis, lumbar region: Secondary | ICD-10-CM

## 2018-07-05 DIAGNOSIS — R7303 Prediabetes: Secondary | ICD-10-CM

## 2018-07-05 DIAGNOSIS — J320 Chronic maxillary sinusitis: Secondary | ICD-10-CM | POA: Diagnosis not present

## 2018-07-05 DIAGNOSIS — D509 Iron deficiency anemia, unspecified: Secondary | ICD-10-CM

## 2018-07-05 DIAGNOSIS — R002 Palpitations: Secondary | ICD-10-CM

## 2018-07-05 DIAGNOSIS — R52 Pain, unspecified: Secondary | ICD-10-CM | POA: Diagnosis not present

## 2018-07-05 DIAGNOSIS — Z0000 Encounter for general adult medical examination without abnormal findings: Secondary | ICD-10-CM | POA: Diagnosis not present

## 2018-07-05 LAB — POCT GLYCOSYLATED HEMOGLOBIN (HGB A1C): Hemoglobin A1C: 5.8 % — AB (ref 4.0–5.6)

## 2018-07-05 LAB — POCT URINALYSIS DIP (MANUAL ENTRY)
Bilirubin, UA: NEGATIVE
Blood, UA: NEGATIVE
Glucose, UA: NEGATIVE mg/dL
Leukocytes, UA: NEGATIVE
Nitrite, UA: NEGATIVE
Protein Ur, POC: NEGATIVE mg/dL
Spec Grav, UA: 1.02 (ref 1.010–1.025)
Urobilinogen, UA: 0.2 E.U./dL
pH, UA: 6.5 (ref 5.0–8.0)

## 2018-07-05 MED ORDER — AMOXICILLIN-POT CLAVULANATE 875-125 MG PO TABS
1.0000 | ORAL_TABLET | Freq: Two times a day (BID) | ORAL | 0 refills | Status: DC
Start: 2018-07-05 — End: 2018-09-14

## 2018-07-05 MED ORDER — FLUTICASONE PROPIONATE 50 MCG/ACT NA SUSP: 2.0000 | Freq: Every day | NASAL | 2 refills | Status: DC

## 2018-07-05 MED ORDER — CETIRIZINE HCL 10 MG PO TABS: 10.0000 mg | ORAL_TABLET | Freq: Every evening | ORAL | 2 refills | Status: DC | PRN

## 2018-07-05 NOTE — Patient Instructions (Signed)
Amoxicillin; Clavulanic Acid tablets What is this medicine? AMOXICILLIN; CLAVULANIC ACID (a mox i SIL in; KLAV yoo lan ic AS id) is a penicillin antibiotic. It is used to treat certain kinds of bacterial infections. It will not work for colds, flu, or other viral infections. This medicine may be used for other purposes; ask your health care provider or pharmacist if you have questions. COMMON BRAND NAME(S): Augmentin What should I tell my health care provider before I take this medicine? They need to know if you have any of these conditions: -bowel disease, like colitis -kidney disease -liver disease -mononucleosis -an unusual or allergic reaction to amoxicillin, penicillin, cephalosporin, other antibiotics, clavulanic acid, other medicines, foods, dyes, or preservatives -pregnant or trying to get pregnant -breast-feeding How should I use this medicine? Take this medicine by mouth with a full glass of water. Follow the directions on the prescription label. Take at the start of a meal. Do not crush or chew. If the tablet has a score line, you may cut it in half at the score line for easier swallowing. Take your medicine at regular intervals. Do not take your medicine more often than directed. Take all of your medicine as directed even if you think you are better. Do not skip doses or stop your medicine early. Talk to your pediatrician regarding the use of this medicine in children. Special care may be needed. Overdosage: If you think you have taken too much of this medicine contact a poison control center or emergency room at once. NOTE: This medicine is only for you. Do not share this medicine with others. What if I miss a dose? If you miss a dose, take it as soon as you can. If it is almost time for your next dose, take only that dose. Do not take double or extra doses. What may interact with this medicine? -allopurinol -anticoagulants -birth control pills -methotrexate -probenecid This  list may not describe all possible interactions. Give your health care provider a list of all the medicines, herbs, non-prescription drugs, or dietary supplements you use. Also tell them if you smoke, drink alcohol, or use illegal drugs. Some items may interact with your medicine. What should I watch for while using this medicine? Tell your doctor or health care professional if your symptoms do not improve. Do not treat diarrhea with over the counter products. Contact your doctor if you have diarrhea that lasts more than 2 days or if it is severe and watery. If you have diabetes, you may get a false-positive result for sugar in your urine. Check with your doctor or health care professional. Birth control pills may not work properly while you are taking this medicine. Talk to your doctor about using an extra method of birth control. What side effects may I notice from receiving this medicine? Side effects that you should report to your doctor or health care professional as soon as possible: -allergic reactions like skin rash, itching or hives, swelling of the face, lips, or tongue -breathing problems -dark urine -fever or chills, sore throat -redness, blistering, peeling or loosening of the skin, including inside the mouth -seizures -trouble passing urine or change in the amount of urine -unusual bleeding, bruising -unusually weak or tired -white patches or sores in the mouth or throat Side effects that usually do not require medical attention (report to your doctor or health care professional if they continue or are bothersome): -diarrhea -dizziness -headache -nausea, vomiting -stomach upset -vaginal or anal irritation This list may   not describe all possible side effects. Call your doctor for medical advice about side effects. You may report side effects to FDA at 1-800-FDA-1088. Where should I keep my medicine? Keep out of the reach of children. Store at room temperature below 25 degrees  C (77 degrees F). Keep container tightly closed. Throw away any unused medicine after the expiration date. NOTE: This sheet is a summary. It may not cover all possible information. If you have questions about this medicine, talk to your doctor, pharmacist, or health care provider.  2018 Elsevier/Gold Standard (2008-03-07 12:04:30)  

## 2018-07-05 NOTE — Progress Notes (Signed)
New Patient-Establish Care  Subjective:    Patient ID: Erin Bradford, female    DOB: 1971-09-19, 46 y.o.   MRN: [id number]   PCP: [name], NP  Chief Complaint  Patient presents with  . Establish Care  . Annual Exam    HPI  Erin Bradford has a past medical history of Obesity, Chronic Back, Insomnia, Hypertension, GERD, Depression, CKD, Arthritis, Anemia, and Allergies.  She is here today to establish care.  Current Status: Since her last office visit, she is doing well with no complaints. She denies fevers, chills, fatigue, recent infections, weight loss, and night sweats. She has headaches occasional. She has not had any visual changes, dizziness, and falls. No chest pain, heart palpitations, cough and shortness of breath reported. No reports of GI problems such as nausea, vomiting, diarrhea, and constipation. She has no reports of blood in stools, dysuria and hematuria. Her anxiety is mild today.  She denies suicidal ideations, homicidal ideations, or auditory hallucinations. She denies pain today. She had mild edema, which she uses compression hose.   Past Medical History:  Diagnosis Date  . Abnormal laboratory test   . Allergy   . Anemia   . Anxiety   . Arthritis   . Blood transfusion without reported diagnosis   . BMI 39.0-39.9,adult   . Chronic hypokalemia   . Chronic kidney disease    mild  . Depression   . GERD (gastroesophageal reflux disease)   . Hypertension   . Insomnia   . Low back pain   . Obesity   . Occasional tremors   . Palpitations    dx with tachycardia   . PONV (postoperative nausea and vomiting)   . Tiredness     Family History  Problem Relation Age of Onset  . Diabetes Mother   . Hypertension Mother   . Hyperlipidemia Mother   . Sarcoidosis Mother   . Kidney disease Father   . Atrial fibrillation Sister   . Kidney disease Maternal Grandmother   . Pancreatic cancer Maternal Grandmother   . Diabetes Maternal Grandmother   .  Hypertension Maternal Grandmother   . Cancer Maternal Grandfather   . Colon cancer Neg Hx   . Colon polyps Neg Hx   . Esophageal cancer Neg Hx   . Stomach cancer Neg Hx   . Rectal cancer Neg Hx     Social History   Socioeconomic History  . Marital status: Widowed    Spouse name: Not on file  . Number of children: Not on file  . Years of education: Not on file  . Highest education level: Not on file  Occupational History  . Not on file  Social Needs  . Financial resource strain: Not on file  . Food insecurity:    Worry: Not on file    Inability: Not on file  . Transportation needs:    Medical: Not on file    Non-medical: Not on file  Tobacco Use  . Smoking status: Never Smoker  . Smokeless tobacco: Never Used  Substance and Sexual Activity  . Alcohol use: No  . Drug use: No  . Sexual activity: Never    Birth control/protection: Condom  Lifestyle  . Physical activity:    Days per week: Not on file    Minutes per session: Not on file  . Stress: Not on file  Relationships  . Social connections:    Talks on phone: Not on file    Gets together: Not on  file    Attends religious service: Not on file    Active member of club or organization: Not on file    Attends meetings of clubs or organizations: Not on file    Relationship status: Not on file  . Intimate partner violence:    Fear of current or ex partner: Not on file    Emotionally abused: Not on file    Physically abused: Not on file    Forced sexual activity: Not on file  Other Topics Concern  . Not on file  Social History Narrative  . Not on file    Past Surgical History:  Procedure Laterality Date  . CESAREAN SECTION    . TOOTH EXTRACTION    . TOTAL LAPAROSCOPIC HYSTERECTOMY WITH SALPINGECTOMY Bilateral 10/06/2017   Procedure: ATTEMPTED HYSTERECTOMY TOTAL LAPAROSCOPIC WITH SALPINGECTOMY CONVERTED TO OPEN ABDOMINAL TOTAL HYSTERECTOMY WITH SALPINGECTOMY;  Surgeon: Sherlyn Hay, DO;  Location:  [place];  Service: Gynecology;  Laterality: Bilateral;    Immunization History  Administered Date(s) Administered  . Influenza,inj,Quad PF,6+ Mos 10/09/2017  . Influenza-Unspecified 10/09/2017    Current Meds  Medication Sig  . ACCU-CHEK AVIVA PLUS test strip USE TO CHECK BLOOD SUGAR TWICE WEEKLY  . buPROPion (WELLBUTRIN XL) 300 MG 24 hr tablet Take 1 tablet (300 mg total) by mouth daily.  . carvedilol (COREG) 12.5 MG tablet Take 1 & 1/2 tablets (18.75 mg total) by mouth twice daily  . cetirizine (ZYRTEC) 10 MG tablet Take 1 tablet (10 mg total) by mouth at bedtime as needed for allergies.  . Fluocinolone Acetonide Body 0.01 % OIL Place 2 drops into both ears 2 (two) times daily.   . furosemide (LASIX) 20 MG tablet Take 20 mg by mouth 2 (two) times daily.   Marland Kitchen gabapentin (NEURONTIN) 300 MG capsule Take 300 mg by mouth 3 (three) times daily.   . Glucose Blood (BLOOD GLUCOSE TEST STRIPS) STRP 2 (two) times a week.  Marland Kitchen HYDROcodone-acetaminophen (NORCO) 10-325 MG tablet Take 1 tablet by mouth 4 (four) times daily.   Marland Kitchen lamoTRIgine (LAMICTAL) 25 MG tablet Take 1 tab daily for 1 week and than 2 tab daily  . losartan (COZAAR) 100 MG tablet Take 100 mg by mouth daily.  . methocarbamol (ROBAXIN) 750 MG tablet Take 750 mg by mouth 3 (three) times daily.  . phentermine (ADIPEX-P) 37.5 MG tablet Take 37.5 mg by mouth daily before breakfast.  . primidone (MYSOLINE) 50 MG tablet TAKE 6 TABLETS(300 MG) BY MOUTH AT BEDTIME  . spironolactone (ALDACTONE) 50 MG tablet Take 50 mg by mouth 2 (two) times daily.  Marland Kitchen zolpidem (AMBIEN) 10 MG tablet TAKE 1 TABLET BY MOUTH EVERY NIGHT AT BEDTIME AS NEEDED FOR INSOMNIA  . [DISCONTINUED] cetirizine (ZYRTEC) 10 MG tablet Take 10 mg by mouth at bedtime as needed for allergies.     Allergies  Allergen Reactions  . Ace Inhibitors Anaphylaxis and Swelling    Swelling of tongue.    BP 140/90 (BP Location: Left Wrist, Patient Position: Sitting, Cuff  Size: Large)   Pulse 66   Temp 97.8 F (36.6 C) (Oral)   Ht 5' 5.5" (1.664 m)   Wt 265 lb (120.2 kg)   LMP 09/17/2017 (Approximate)   SpO2 95%   BMI 43.43 kg/m   Review of Systems  Constitutional: Negative.   HENT: Negative.   Eyes: Negative.   Respiratory: Negative.   Cardiovascular: Positive for palpitations (occasional ).  Gastrointestinal: Positive for abdominal pain (right upper quadrant),  constipation (r/t pain medications) and nausea.  Endocrine: Negative.   Genitourinary: Negative.   Musculoskeletal: Positive for back pain (chronic lower back pain).  Skin: Negative.   Allergic/Immunologic: Negative.   Neurological: Positive for headaches.       Hot flahes  Hematological: Negative.   Psychiatric/Behavioral: Negative.    Objective:   Physical Exam  Constitutional: She is oriented to person, place, and time. She appears well-developed and well-nourished.  HENT:  Head: Normocephalic and atraumatic.  Right Ear: External ear normal.  Left Ear: External ear normal.  Nose: Nose normal.  Mouth/Throat: Oropharynx is clear and moist.  Eyes: Pupils are equal, round, and reactive to light. Conjunctivae and EOM are normal.  Neck: Normal range of motion. Neck supple.  Cardiovascular: Normal rate, regular rhythm, normal heart sounds and intact distal pulses.  Pulmonary/Chest: Effort normal and breath sounds normal.  Abdominal: Soft. Bowel sounds are normal.  Musculoskeletal: Normal range of motion.  Neurological: She is alert and oriented to person, place, and time.  Skin: Skin is warm and dry. Capillary refill takes less than 2 seconds.  Psychiatric: She has a normal mood and affect. Her behavior is normal. Judgment and thought content normal.  Nursing note and vitals reviewed.  Assessment & Plan:   1. Encounter to establish care  2. Prediabetes Hgb A1c is stable at 5.8 today.  She will continue to decrease foods/beverages high in sugars and carbs and follow Heart  Healthy or DASH diet. Increase physical activity to at least 30 minutes cardio exercise daily.  - POCT glycosylated hemoglobin (Hb A1C) - POCT urinalysis dipstick  3. Health care maintenance - Comprehensive metabolic panel - TSH - Lipid Panel - Vitamin D, 25-hydroxy  4. Seasonal allergies - cetirizine (ZYRTEC) 10 MG tablet; Take 1 tablet (10 mg total) by mouth at bedtime as needed for allergies.  Dispense: 30 tablet; Refill: 2 - fluticasone (FLONASE) 50 MCG/ACT nasal spray; Place 2 sprays into both nostrils daily.  Dispense: 16 g; Refill: 2  5. Maxillary sinusitis, unspecified chronicity - amoxicillin-clavulanate (AUGMENTIN) 875-125 MG tablet; Take 1 tablet by mouth 2 (two) times daily.  Dispense: 20 tablet; Refill: 0  6. Pain management She will continue to follow up at [facility], with Dr. Mirna Mires.  7. Heart palpitations Stable. She will continue to follow up with Cardiologist Dr. Curt Bears.   8. Iron deficiency anemia Iron studies improved on 06/16/2018. Her last iron infusion was on 07/01/2018. We will continue to monitor. She continues to be followed by Dr. Alen Blew, Oncologist.   9. Follow up She will follow up in 3 months.   Meds ordered this encounter  Medications  . cetirizine (ZYRTEC) 10 MG tablet    Sig: Take 1 tablet (10 mg total) by mouth at bedtime as needed for allergies.    Dispense:  30 tablet    Refill:  2  . fluticasone (FLONASE) 50 MCG/ACT nasal spray    Sig: Place 2 sprays into both nostrils daily.    Dispense:  16 g    Refill:  2  . amoxicillin-clavulanate (AUGMENTIN) 875-125 MG tablet    Sig: Take 1 tablet by mouth 2 (two) times daily.    Dispense:  20 tablet    Refill:  0    [name],  MSN, FNP-C [facility] [address] [place], [place] [address] [phone]

## 2018-07-06 DIAGNOSIS — R1031 Right lower quadrant pain: Secondary | ICD-10-CM | POA: Diagnosis not present

## 2018-07-06 LAB — LIPID PANEL
Chol/HDL Ratio: 4.8 ratio — ABNORMAL HIGH (ref 0.0–4.4)
Cholesterol, Total: 183 mg/dL (ref 100–199)
HDL: 38 mg/dL — ABNORMAL LOW (ref >39)
LDL Calculated: 114 mg/dL — ABNORMAL HIGH (ref 0–99)
Triglycerides: 156 mg/dL — ABNORMAL HIGH (ref 0–149)
VLDL Cholesterol Cal: 31 mg/dL (ref 5–40)

## 2018-07-06 LAB — COMPREHENSIVE METABOLIC PANEL WITH GFR
ALT: 16 IU/L (ref 0–32)
AST: 19 IU/L (ref 0–40)
Albumin/Globulin Ratio: 1.7 (ref 1.2–2.2)
Albumin: 4.2 g/dL (ref 3.5–5.5)
Alkaline Phosphatase: 94 IU/L (ref 39–117)
BUN/Creatinine Ratio: 8 — ABNORMAL LOW (ref 9–23)
BUN: 7 mg/dL (ref 6–24)
Bilirubin Total: 0.3 mg/dL (ref 0.0–1.2)
CO2: 22 mmol/L (ref 20–29)
Calcium: 9.5 mg/dL (ref 8.7–10.2)
Chloride: 101 mmol/L (ref 96–106)
Creatinine, Ser: 0.84 mg/dL (ref 0.57–1.00)
GFR calc Af Amer: 94 mL/min/1.73
GFR calc non Af Amer: 82 mL/min/1.73
Globulin, Total: 2.5 g/dL (ref 1.5–4.5)
Glucose: 104 mg/dL — ABNORMAL HIGH (ref 65–99)
Potassium: 4.2 mmol/L (ref 3.5–5.2)
Sodium: 136 mmol/L (ref 134–144)
Total Protein: 6.7 g/dL (ref 6.0–8.5)

## 2018-07-06 LAB — TSH: TSH: 1.66 u[IU]/mL (ref 0.450–4.500)

## 2018-07-06 LAB — VITAMIN D 25 HYDROXY (VIT D DEFICIENCY, FRACTURES): Vit D, 25-Hydroxy: 14.3 ng/mL — ABNORMAL LOW (ref 30.0–100.0)

## 2018-07-10 DIAGNOSIS — M79604 Pain in right leg: Secondary | ICD-10-CM | POA: Diagnosis not present

## 2018-07-10 DIAGNOSIS — N83202 Unspecified ovarian cyst, left side: Secondary | ICD-10-CM | POA: Diagnosis not present

## 2018-07-10 DIAGNOSIS — G894 Chronic pain syndrome: Secondary | ICD-10-CM | POA: Diagnosis not present

## 2018-07-10 DIAGNOSIS — M79605 Pain in left leg: Secondary | ICD-10-CM | POA: Diagnosis not present

## 2018-07-10 DIAGNOSIS — Z79891 Long term (current) use of opiate analgesic: Secondary | ICD-10-CM | POA: Diagnosis not present

## 2018-07-10 DIAGNOSIS — M545 Low back pain: Secondary | ICD-10-CM | POA: Diagnosis not present

## 2018-07-11 ENCOUNTER — Other Ambulatory Visit: Payer: Self-pay | Admitting: Family Medicine

## 2018-07-11 ENCOUNTER — Encounter: Payer: Self-pay | Admitting: Family Medicine

## 2018-07-11 ENCOUNTER — Ambulatory Visit (INDEPENDENT_AMBULATORY_CARE_PROVIDER_SITE_OTHER): Payer: Medicaid Other | Admitting: Licensed Clinical Social Worker

## 2018-07-11 DIAGNOSIS — E559 Vitamin D deficiency, unspecified: Secondary | ICD-10-CM

## 2018-07-11 DIAGNOSIS — F330 Major depressive disorder, recurrent, mild: Secondary | ICD-10-CM

## 2018-07-11 MED ORDER — VITAMIN D (ERGOCALCIFEROL) 1.25 MG (50000 UNIT) PO CAPS: 50000.0000 [IU] | ORAL_CAPSULE | ORAL | 2 refills | Status: DC

## 2018-07-11 NOTE — Progress Notes (Signed)
Rx for Vitamin D to pharmacy today.

## 2018-07-12 ENCOUNTER — Encounter (HOSPITAL_COMMUNITY): Payer: Self-pay | Admitting: Licensed Clinical Social Worker

## 2018-07-12 NOTE — Progress Notes (Signed)
Daily Group Progress Note Program:  OP Evening Group   Group Time: 5:30-6:30pm  Participation Level: Active  Behavioral Response: Appropriate  Type of Therapy:  Psychoeducation/Group Process  Summary of Progress: Pt participated in a discussion on relationships and boundaries with family members. Pt shared the relationship with a family member is currently strained.  Pt was encouraged to continue to use boundaries with people in her life for healthy self-care.  Iyauna Sing, LCAS 

## 2018-07-18 ENCOUNTER — Ambulatory Visit (INDEPENDENT_AMBULATORY_CARE_PROVIDER_SITE_OTHER): Payer: Medicaid Other | Admitting: Licensed Clinical Social Worker

## 2018-07-18 DIAGNOSIS — F330 Major depressive disorder, recurrent, mild: Secondary | ICD-10-CM

## 2018-07-19 ENCOUNTER — Ambulatory Visit (INDEPENDENT_AMBULATORY_CARE_PROVIDER_SITE_OTHER): Payer: Medicaid Other | Admitting: Psychiatry

## 2018-07-19 ENCOUNTER — Other Ambulatory Visit: Payer: Self-pay

## 2018-07-19 ENCOUNTER — Encounter (HOSPITAL_COMMUNITY): Payer: Self-pay | Admitting: Psychiatry

## 2018-07-19 ENCOUNTER — Emergency Department (HOSPITAL_COMMUNITY)
Admission: EM | Admit: 2018-07-19 | Discharge: 2018-07-20 | Disposition: A | Discharge status: Home / Self Care | Payer: Medicaid Other | Attending: Emergency Medicine | Admitting: Emergency Medicine

## 2018-07-19 ENCOUNTER — Encounter (HOSPITAL_COMMUNITY): Payer: Self-pay

## 2018-07-19 ENCOUNTER — Encounter (HOSPITAL_COMMUNITY): Payer: Self-pay | Admitting: Licensed Clinical Social Worker

## 2018-07-19 VITALS — BP 132/93 | HR 78 | Ht 65.5 in | Wt 258.0 lb

## 2018-07-19 DIAGNOSIS — I129 Hypertensive chronic kidney disease with stage 1 through stage 4 chronic kidney disease, or unspecified chronic kidney disease: Secondary | ICD-10-CM | POA: Insufficient documentation

## 2018-07-19 DIAGNOSIS — N189 Chronic kidney disease, unspecified: Secondary | ICD-10-CM | POA: Insufficient documentation

## 2018-07-19 DIAGNOSIS — T50901A Poisoning by unspecified drugs, medicaments and biological substances, accidental (unintentional), initial encounter: Secondary | ICD-10-CM | POA: Diagnosis not present

## 2018-07-19 DIAGNOSIS — T426X1A Poisoning by other antiepileptic and sedative-hypnotic drugs, accidental (unintentional), initial encounter: Secondary | ICD-10-CM | POA: Diagnosis not present

## 2018-07-19 DIAGNOSIS — F330 Major depressive disorder, recurrent, mild: Secondary | ICD-10-CM | POA: Diagnosis not present

## 2018-07-19 DIAGNOSIS — F5101 Primary insomnia: Secondary | ICD-10-CM

## 2018-07-19 DIAGNOSIS — R0602 Shortness of breath: Secondary | ICD-10-CM | POA: Diagnosis not present

## 2018-07-19 DIAGNOSIS — Z79899 Other long term (current) drug therapy: Secondary | ICD-10-CM | POA: Diagnosis not present

## 2018-07-19 DIAGNOSIS — R002 Palpitations: Secondary | ICD-10-CM | POA: Diagnosis not present

## 2018-07-19 LAB — BASIC METABOLIC PANEL
ANION GAP: 9 (ref 5–15)
BUN: 9 mg/dL (ref 6–20)
CO2: 28 mmol/L (ref 22–32)
Calcium: 9.4 mg/dL (ref 8.9–10.3)
Chloride: 101 mmol/L (ref 98–111)
Creatinine, Ser: 1.05 mg/dL — ABNORMAL HIGH (ref 0.44–1.00)
GLUCOSE: 94 mg/dL (ref 70–99)
POTASSIUM: 3.8 mmol/L (ref 3.5–5.1)
Sodium: 138 mmol/L (ref 135–145)

## 2018-07-19 LAB — CBC
HCT: 41.6 % (ref 36.0–46.0)
Hemoglobin: 13.5 g/dL (ref 12.0–15.0)
MCH: 29.7 pg (ref 26.0–34.0)
MCHC: 32.5 g/dL (ref 30.0–36.0)
MCV: 91.6 fL (ref 78.0–100.0)
Platelets: 383 10*3/uL (ref 150–400)
RBC: 4.54 MIL/uL (ref 3.87–5.11)
RDW: 12.3 % (ref 11.5–15.5)
WBC: 9.5 10*3/uL (ref 4.0–10.5)

## 2018-07-19 MED ORDER — TOPIRAMATE 25 MG PO TABS: 25.0000 mg | ORAL_TABLET | Freq: Every day | ORAL | 0 refills | Status: DC

## 2018-07-19 MED ORDER — ZOLPIDEM TARTRATE 10 MG PO TABS: ORAL_TABLET | ORAL | 1 refills | Status: DC

## 2018-07-19 MED ORDER — FLUOXETINE HCL 20 MG PO CAPS: 20.0000 mg | ORAL_CAPSULE | Freq: Every day | ORAL | 0 refills | Status: DC

## 2018-07-19 MED ORDER — FLUOXETINE HCL 40 MG PO CAPS: 40.0000 mg | ORAL_CAPSULE | Freq: Every day | ORAL | 0 refills | Status: DC

## 2018-07-19 NOTE — ED Triage Notes (Signed)
Pt states that she started a new medication today and accidentally mixed them up, took topiramate 125mg  and was supposed to take 25mg . And only took one Primidone 50mg  and was supposed to take 6 of them. Pt now feeling flushed, anxious, SOB. Denies SI/HI/AVH

## 2018-07-19 NOTE — Progress Notes (Signed)
BH MD/PA/NP OP Progress Note  5/69/7948 0:16 PM Erin Bradford  MRN:  [id number]  Chief Complaint: Sweating, anxiety HPI: Erin Bradford presents for a cross coverage visit.  I spent time with the patient reviewing that a big part of why she is sweating is Wellbutrin, in addition combined with the phentermine.  Discussed that phentermine is a derivative of addictive stimulants, and I strongly recommend she discontinue.  Educated her that the medication is only indicated for 12 weeks of use, and also tends to elicit more anxiety, more insomnia, and more sweating.   She has previously done fine in the past on Prozac, so I suggested we discontinue Wellbutrin and phentermine, and restart Prozac, titrate to 40 mg.  She tried the Lamictal but it gave her skin rash so she discontinued.  She continues on Ambien nightly which helps her for her insomnia symptoms, and we agreed to add a low-dose of Topamax to help with evening snacking habits and augmentation for anxiety.   No other acute concerns, and she is going to be following up with her weight loss surgeon/bariatric doctor in a few days.  I told her to let them know psychiatry has discontinued this phentermine medication.  Visit Diagnosis:    ICD-10-CM   1. MDD (major depressive disorder), recurrent episode, mild (HCC) F33.0 FLUoxetine (PROZAC) 20 MG capsule    FLUoxetine (PROZAC) 40 MG capsule    topiramate (TOPAMAX) 25 MG tablet  2. Primary insomnia F51.01 zolpidem (AMBIEN) 10 MG tablet    topiramate (TOPAMAX) 25 MG tablet   Past Psychiatric History: See intake H&P for full details. Reviewed, with no updates at this time.  Past Medical History:  Past Medical History:  Diagnosis Date  . Abnormal laboratory test   . Allergy   . Anemia   . Anxiety   . Arthritis   . Blood transfusion without reported diagnosis   . BMI 39.0-39.9,adult   . Chronic hypokalemia   . Chronic kidney disease    mild  . Depression   . GERD  (gastroesophageal reflux disease)   . Hypertension   . Insomnia   . Low back pain   . Obesity   . Occasional tremors   . Palpitations    dx with tachycardia   . PONV (postoperative nausea and vomiting)   . Tiredness     Past Surgical History:  Procedure Laterality Date  . CESAREAN SECTION    . TOOTH EXTRACTION    . TOTAL LAPAROSCOPIC HYSTERECTOMY WITH SALPINGECTOMY Bilateral 10/06/2017   Procedure: ATTEMPTED HYSTERECTOMY TOTAL LAPAROSCOPIC WITH SALPINGECTOMY CONVERTED TO OPEN ABDOMINAL TOTAL HYSTERECTOMY WITH SALPINGECTOMY;  Surgeon: Sherlyn Hay, DO;  Location: [facility];  Service: Gynecology;  Laterality: Bilateral;    Family Psychiatric History: See intake H&P for full details. Reviewed, with no updates at this time.   Family History:  Family History  Problem Relation Age of Onset  . Diabetes Mother   . Hypertension Mother   . Hyperlipidemia Mother   . Sarcoidosis Mother   . Kidney disease Father   . Atrial fibrillation Sister   . Kidney disease Maternal Grandmother   . Pancreatic cancer Maternal Grandmother   . Diabetes Maternal Grandmother   . Hypertension Maternal Grandmother   . Cancer Maternal Grandfather   . Colon cancer Neg Hx   . Colon polyps Neg Hx   . Esophageal cancer Neg Hx   . Stomach cancer Neg Hx   . Rectal cancer Neg Hx  Social History:  Social History   Socioeconomic History  . Marital status: Widowed    Spouse name: Not on file  . Number of children: Not on file  . Years of education: Not on file  . Highest education level: Not on file  Occupational History  . Not on file  Social Needs  . Financial resource strain: Not on file  . Food insecurity:    Worry: Not on file    Inability: Not on file  . Transportation needs:    Medical: Not on file    Non-medical: Not on file  Tobacco Use  . Smoking status: Never Smoker  . Smokeless tobacco: Never Used  Substance and Sexual Activity  . Alcohol use: No  .  Drug use: No  . Sexual activity: Never    Birth control/protection: Condom  Lifestyle  . Physical activity:    Days per week: Not on file    Minutes per session: Not on file  . Stress: Not on file  Relationships  . Social connections:    Talks on phone: Not on file    Gets together: Not on file    Attends religious service: Not on file    Active member of club or organization: Not on file    Attends meetings of clubs or organizations: Not on file    Relationship status: Not on file  Other Topics Concern  . Not on file  Social History Narrative  . Not on file    Allergies:  Allergies  Allergen Reactions  . Ace Inhibitors Anaphylaxis and Swelling    Swelling of tongue.    Metabolic Disorder Labs: Lab Results  Component Value Date   HGBA1C 5.8 (A) 07/05/2018   No results found for: PROLACTIN Lab Results  Component Value Date   CHOL 183 07/05/2018   TRIG 156 (H) 07/05/2018   HDL 38 (L) 07/05/2018   CHOLHDL 4.8 (H) 07/05/2018   LDLCALC 114 (H) 07/05/2018   Lab Results  Component Value Date   TSH 1.660 07/05/2018   TSH 1.05 12/16/2017    Therapeutic Level Labs: No results found for: LITHIUM No results found for: VALPROATE No components found for:  CBMZ  Current Medications: Current Outpatient Medications  Medication Sig Dispense Refill  . ACCU-CHEK AVIVA PLUS test strip USE TO CHECK BLOOD SUGAR TWICE WEEKLY  11  . carvedilol (COREG) 12.5 MG tablet Take 1 & 1/2 tablets (18.75 mg total) by mouth twice daily 270 tablet 3  . cetirizine (ZYRTEC) 10 MG tablet Take 1 tablet (10 mg total) by mouth at bedtime as needed for allergies. 30 tablet 2  . Fluocinolone Acetonide Body 0.01 % OIL Place 2 drops into both ears 2 (two) times daily.     . fluticasone (FLONASE) 50 MCG/ACT nasal spray Place 2 sprays into both nostrils daily. 16 g 2  . furosemide (LASIX) 20 MG tablet Take 20 mg by mouth 2 (two) times daily.     Marland Kitchen gabapentin (NEURONTIN) 300 MG capsule Take 300 mg by  mouth 3 (three) times daily.     . Glucose Blood (BLOOD GLUCOSE TEST STRIPS) STRP 2 (two) times a week.    Marland Kitchen HYDROcodone-acetaminophen (NORCO) 10-325 MG tablet Take 1 tablet by mouth 4 (four) times daily.     Marland Kitchen losartan (COZAAR) 100 MG tablet Take 100 mg by mouth daily.    . methocarbamol (ROBAXIN) 750 MG tablet Take 750 mg by mouth 3 (three) times daily.  5  . primidone (MYSOLINE)  50 MG tablet TAKE 6 TABLETS(300 MG) BY MOUTH AT BEDTIME 180 tablet 6  . spironolactone (ALDACTONE) 50 MG tablet Take 50 mg by mouth 2 (two) times daily.    . Vitamin D, Ergocalciferol, (DRISDOL) 50000 units CAPS capsule Take 1 capsule (50,000 Units total) by mouth every 7 (seven) days. 5 capsule 2  . zolpidem (AMBIEN) 10 MG tablet TAKE 1 TABLET BY MOUTH EVERY NIGHT AT BEDTIME AS NEEDED FOR INSOMNIA 30 tablet 1  . amoxicillin-clavulanate (AUGMENTIN) 875-125 MG tablet Take 1 tablet by mouth 2 (two) times daily. (Patient not taking: Reported on 07/19/2018) 20 tablet 0  . FLUoxetine (PROZAC) 20 MG capsule Take 1 capsule (20 mg total) by mouth daily for 14 doses. 14 capsule 0  . [START ON 08/02/2018] FLUoxetine (PROZAC) 40 MG capsule Take 1 capsule (40 mg total) by mouth daily. 90 capsule 0  . topiramate (TOPAMAX) 25 MG tablet Take 1 tablet (25 mg total) by mouth at bedtime. Take with dinner 90 tablet 0   No current facility-administered medications for this visit.     Musculoskeletal: Strength & Muscle Tone: within normal limits Gait & Station: normal Patient leans: N/A  Psychiatric Specialty Exam: ROS  Blood pressure (!) 132/93, pulse 78, height 5' 5.5" (1.664 m), weight 258 lb (117 kg), last menstrual period 09/17/2017, SpO2 95 %.Body mass index is 42.28 kg/m.  General Appearance: Casual and Well Groomed  Eye Contact:  Good  Speech:  Clear and Coherent and Normal Rate  Volume:  Normal  Mood:  Anxious and sweating  Affect:  Appropriate and Congruent  Thought Process:  Coherent and Descriptions of Associations:  Intact  Orientation:  Full (Time, Place, and Person)  Thought Content: Logical   Suicidal Thoughts:  No  Homicidal Thoughts:  No  Memory:  Immediate;   Good  Judgement:  Good  Insight:  Good  Psychomotor Activity:  Normal  Concentration:  Concentration: Good  Recall:  [place] of Knowledge: Good  Language: Good  Akathisia:  Negative  Handed:  Right  AIMS (if indicated): not done  Assets:  Communication Skills Desire for Improvement Financial Resources/Insurance Housing Social Support Transportation Vocational/Educational  ADL's:  Intact  Cognition: WNL  Sleep:  Good   Screenings: PHQ2-9     Office Visit from 07/05/2018 in [facility] from 05/09/2018 in [place]  PHQ-2 Total Score  4  6  PHQ-9 Total Score  8  14      Assessment and Plan:  Jya R Fragoso presents for a cross coverage visit.  She continues to struggle with depression, anxiety, was unable to tolerate Lamictal due to skin rash.  I spent time with her reviewing her primary concerns which are related to ongoing anxiety and sweating.  She also has palpitations.  Educated her on the deleterious effects of phentermine and Wellbutrin for her set of symptoms, and suggested that she may benefit from a return to Prozac which has been helpful for her in the past.  We agreed to also titrate Prozac to a higher overall effective dose, and continue augmentation with Ambien nightly for sleep.  For weight loss I suggested she may benefit from a trial of Topamax nightly to help with evening snacking which continues to be a contributor to her weight gain.  She denies any other acute needs and will follow-up with Dr. Adele Schilder or this writer in 6-8 weeks.  1. MDD (major depressive disorder), recurrent episode, mild ([place])  2. Primary insomnia     Status of current problems: unchanged  Labs Ordered: No orders of the defined types were placed in this  encounter.   Labs Reviewed: n/a  Collateral Obtained/Records Reviewed: n/a  Plan:  Stop Wellbutrin given ongoing sweating Stop phentermine given ongoing sweating, insomnia, anxiety Educated patient on the deleterious and addictive effects of phentermine Start Prozac 20 mg x 2 weeks, then 40 mg daily Continue Ambien 10 mg nightly Topamax 25 mg nightly lamictal self-discontinued given patient had a skin rash  Aundra Dubin, MD 07/19/2018, 2:54 PM

## 2018-07-19 NOTE — Progress Notes (Signed)
  Daily Group Progress Note Program:  Outpatient Evening Group  Group Time: 5:30-6:30pm   Participation Level: Active  Behavioral Response: Appropriate   Type of Therapy:  Psychoeducation/Therapy  Summary of Progress:  Pt participated in a discussion on relationships and boundaries. Pt discussed her relationship with her 49 yo daughter who refuses to speak to her and won't give a reason why. The group gave her suggestions on ways to use boundaries to protect herself. Pt was encouraged to continue to use boundaries with people in her life for healthy self-care.  Erin Bradford, LCAS

## 2018-07-20 NOTE — Discharge Instructions (Addendum)
You can start you 25 mg dose of Topomax tonight (07/20/18). Return to the ED as needed.

## 2018-07-20 NOTE — ED Provider Notes (Signed)
[facility] EMERGENCY DEPARTMENT Provider Note   CSN: [id number] Arrival date & time: 07/19/18  2218     History   Chief Complaint Chief Complaint  Patient presents with  . Drug Overdose    HPI Erin Bradford is a 47 y.o. female.  Patient presents after accidental overdose having taken 125 mg Topomax instead of the 25 mg prescribed dose. Ingestion occurred around 9:00 pm.  She was prescribed the medication today for appetite control and tonight would have been her first dose. She got it confused with her other medication (Primodone). When she discovered what she had done she started having symptoms c/w her anxiety - nervousness, SOB, palpitations. Symptoms have completely resolved at the time of assessment.   The history is provided by the patient. No language interpreter was used.  Drug Overdose  Associated symptoms include shortness of breath.    Past Medical History:  Diagnosis Date  . Abnormal laboratory test   . Allergy   . Anemia   . Anxiety   . Arthritis   . Blood transfusion without reported diagnosis   . BMI 39.0-39.9,adult   . Chronic hypokalemia   . Chronic kidney disease    mild  . Depression   . GERD (gastroesophageal reflux disease)   . Hypertension   . Insomnia   . Low back pain   . Obesity   . Occasional tremors   . Palpitations    dx with tachycardia   . PONV (postoperative nausea and vomiting)   . Tiredness     Patient Active Problem List   Diagnosis Date Noted  . Prediabetes 11/28/2017  . Post-operative state 10/06/2017  . Seasonal allergies 09/27/2017  . Tachycardia 09/02/2017  . Hypertensive disorder 09/02/2017  . 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 Iron deficiency anemia 02/04/2017  . Iron deficiency anemia due to chronic blood loss 02/04/2017  . Tremor 01/25/2017  . Class 3 obesity with serious comorbidity and body mass index (BMI) of 40.0 to 44.9 in adult 01/25/2017  . Chronic low  back pain 01/25/2017    Past Surgical History:  Procedure Laterality Date  . CESAREAN SECTION    . TOOTH EXTRACTION    . TOTAL LAPAROSCOPIC HYSTERECTOMY WITH SALPINGECTOMY Bilateral 10/06/2017   Procedure: ATTEMPTED HYSTERECTOMY TOTAL LAPAROSCOPIC WITH SALPINGECTOMY CONVERTED TO OPEN ABDOMINAL TOTAL HYSTERECTOMY WITH SALPINGECTOMY;  Surgeon: Sherlyn Hay, DO;  Location: [facility];  Service: Gynecology;  Laterality: Bilateral;     OB History   None      Home Medications    Prior to Admission medications   Medication Sig Start Date End Date Taking? Authorizing Provider  carvedilol (COREG) 12.5 MG tablet Take 1 & 1/2 tablets (18.75 mg total) by mouth twice daily 02/24/18  Yes Camnitz, Will Hassell Done, MD  cetirizine (ZYRTEC) 10 MG tablet Take 1 tablet (10 mg total) by mouth at bedtime as needed for allergies. 07/05/18  Yes Azzie Glatter, FNP  Fluocinolone Acetonide Body 0.01 % OIL Place 2 drops into both ears 2 (two) times daily as needed (for ear pain).  09/26/17  Yes [provider]  fluticasone (FLONASE) 50 MCG/ACT nasal spray Place 2 sprays into both nostrils daily. 07/05/18  Yes Azzie Glatter, FNP  furosemide (LASIX) 20 MG tablet Take 20 mg by mouth 2 (two) times daily.    Yes [provider]  gabapentin (NEURONTIN) 300 MG capsule Take 300 mg by mouth 3 (three) times daily.  Yes [provider]  HYDROcodone-acetaminophen (NORCO) 10-325 MG tablet Take 1 tablet by mouth 4 (four) times daily.    Yes [provider]  losartan (COZAAR) 100 MG tablet Take 100 mg by mouth daily.   Yes [provider]  methocarbamol (ROBAXIN) 750 MG tablet Take 750 mg by mouth 3 (three) times daily. 04/19/18  Yes [provider]  primidone (MYSOLINE) 50 MG tablet TAKE 6 TABLETS(300 MG) BY MOUTH AT BEDTIME 05/29/18  Yes Jaffe, Adam R, DO  spironolactone (ALDACTONE) 50 MG tablet Take 50 mg by mouth 2 (two) times daily.   Yes  [provider]  topiramate (TOPAMAX) 25 MG tablet Take 1 tablet (25 mg total) by mouth at bedtime. Take with dinner 07/19/18 07/19/19 Yes Eksir, Richard Miu, MD  Vitamin D, Ergocalciferol, (DRISDOL) 50000 units CAPS capsule Take 1 capsule (50,000 Units total) by mouth every 7 (seven) days. 07/11/18  Yes Azzie Glatter, FNP  zolpidem (AMBIEN) 10 MG tablet TAKE 1 TABLET BY MOUTH EVERY NIGHT AT BEDTIME AS NEEDED FOR INSOMNIA 07/19/18  Yes Eksir, Richard Miu, MD  ACCU-CHEK AVIVA PLUS test strip USE TO CHECK BLOOD SUGAR TWICE WEEKLY 03/03/18   [provider]  amoxicillin-clavulanate (AUGMENTIN) 875-125 MG tablet Take 1 tablet by mouth 2 (two) times daily. Patient not taking: Reported on 07/19/2018 07/05/18   Azzie Glatter, FNP  FLUoxetine (PROZAC) 20 MG capsule Take 1 capsule (20 mg total) by mouth daily for 14 doses. 07/19/18 08/02/18  Aundra Dubin, MD  FLUoxetine (PROZAC) 40 MG capsule Take 1 capsule (40 mg total) by mouth daily. 08/02/18 08/02/19  Aundra Dubin, MD  Glucose Blood (BLOOD GLUCOSE TEST STRIPS) STRP 2 (two) times a week.    [provider]    Family History Family History  Problem Relation Age of Onset  . Diabetes Mother   . Hypertension Mother   . Hyperlipidemia Mother   . Sarcoidosis Mother   . Kidney disease Father   . Atrial fibrillation Sister   . Kidney disease Maternal Grandmother   . Pancreatic cancer Maternal Grandmother   . Diabetes Maternal Grandmother   . Hypertension Maternal Grandmother   . Cancer Maternal Grandfather   . Colon cancer Neg Hx   . Colon polyps Neg Hx   . Esophageal cancer Neg Hx   . Stomach cancer Neg Hx   . Rectal cancer Neg Hx     Social History Social History   Tobacco Use  . Smoking status: Never Smoker  . Smokeless tobacco: Never Used  Substance Use Topics  . Alcohol use: No  . Drug use: No     Allergies   Ace inhibitors   Review of Systems Review of Systems  Constitutional:  Negative for chills.  HENT: Negative.   Respiratory: Positive for shortness of breath.   Cardiovascular: Positive for palpitations.  Gastrointestinal: Negative.   Musculoskeletal: Negative.   Skin: Negative.   Neurological: Negative.   Psychiatric/Behavioral: The patient is nervous/anxious.      Physical Exam Updated Vital Signs BP 133/82 (BP Location: Right Arm)   Pulse 72   Temp 97.7 F (36.5 C)   Resp 16   LMP 09/17/2017 (Approximate)   SpO2 99%   Physical Exam  Constitutional: She is oriented to person, place, and time. She appears well-developed and well-nourished.  Patient is calm, cooperative and asymptomatic.   HENT:  Head: Normocephalic.  Neck: Normal range of motion. Neck supple.  Cardiovascular: Normal rate and regular rhythm.  No murmur heard. Pulmonary/Chest: Effort normal and breath sounds normal. She has no wheezes. She has no rales.  Abdominal: Soft. Bowel sounds are normal. There is no tenderness. There is no rebound and no guarding.  Musculoskeletal: Normal range of motion.  Neurological: She is alert and oriented to person, place, and time.  Skin: Skin is warm and dry. No rash noted.  Psychiatric: She has a normal mood and affect.     ED Treatments / Results  Labs (all labs ordered are listed, but only abnormal results are displayed) Labs Reviewed  BASIC METABOLIC PANEL - Abnormal; Notable for the following components:      Result Value   Creatinine, Ser 1.05 (*)    All other components within normal limits  CBC    EKG EKG Interpretation  Date/Time:  Wednesday July 19 2018 22:21:20 EDT Ventricular Rate:  80 PR Interval:  134 QRS Duration: 88 QT Interval:  366 QTC Calculation: 422 R Axis:   -7 Text Interpretation:  Normal sinus rhythm Normal ECG No old tracing to compare Confirmed by Delora Fuel ([id number]) on 07/19/2018 11:00:58 PM   Radiology No results found.  Procedures Procedures (including critical care time)  Medications  Ordered in ED Medications - No data to display   Initial Impression / Assessment and Plan / ED Course  I have reviewed the triage vital signs and the nursing notes.  Pertinent labs & imaging results that were available during my care of the patient were reviewed by me and considered in my medical decision making (see chart for details).     Patient here after accidental overdose on Topomax. Discussed with Poison Control who advised checking basic chemistries and 4-hour post-ingestion observation.  Chemistries are normal. VSS. She remains asymptomatic. She is felt stable for discharge after having been observed for the recommended 4-hour time. She is comfortable with plan of discharge.   Final Clinical Impressions(s) / ED Diagnoses   Final diagnoses:  None   1. Accidental overdose  ED Discharge Orders    None       Dennie Bible 88/87/57 [id number]    Delora Fuel, MD 20/60/15 [phone]

## 2018-07-24 ENCOUNTER — Ambulatory Visit (HOSPITAL_COMMUNITY): Payer: Self-pay | Admitting: Psychiatry

## 2018-07-25 ENCOUNTER — Ambulatory Visit: Payer: Self-pay | Admitting: Oncology

## 2018-07-25 ENCOUNTER — Ambulatory Visit (HOSPITAL_COMMUNITY): Payer: Self-pay | Admitting: Licensed Clinical Social Worker

## 2018-07-25 ENCOUNTER — Other Ambulatory Visit: Payer: Self-pay

## 2018-07-26 ENCOUNTER — Telehealth (HOSPITAL_COMMUNITY): Payer: Self-pay

## 2018-07-26 DIAGNOSIS — Z6841 Body Mass Index (BMI) 40.0 and over, adult: Secondary | ICD-10-CM | POA: Diagnosis not present

## 2018-07-26 DIAGNOSIS — D500 Iron deficiency anemia secondary to blood loss (chronic): Secondary | ICD-10-CM

## 2018-07-26 DIAGNOSIS — I10 Essential (primary) hypertension: Secondary | ICD-10-CM | POA: Diagnosis not present

## 2018-07-26 NOTE — Telephone Encounter (Signed)
Patient called and she has reservations about switching from Wellbutrin to Prozac, she said that she is afraid she will gain weight on the Prozac and wants to know if she can just stick to the Wellbutrin. Please review and advise, thank you

## 2018-07-27 DIAGNOSIS — H52223 Regular astigmatism, bilateral: Secondary | ICD-10-CM | POA: Diagnosis not present

## 2018-07-27 DIAGNOSIS — H5213 Myopia, bilateral: Secondary | ICD-10-CM | POA: Diagnosis not present

## 2018-07-27 DIAGNOSIS — H524 Presbyopia: Secondary | ICD-10-CM | POA: Diagnosis not present

## 2018-07-27 NOTE — Telephone Encounter (Signed)
Patient is calling back today and states that she has that she has a refill at the pharmacy of the Wellbutrin and she has requested that they fill it. Patient was taken off of Zoloft in May and switched to Lamictal, patient states that it was for anxiety, she stopped taking it because she developed a rash and she states she needs something to replace it.

## 2018-07-27 NOTE — Telephone Encounter (Signed)
Im confused. She should not be on wellbutrin.  She should be taking the following as discussed at her recent visit with me.  Prozac 20 mg x 2 weeks, then 40 mg daily Continue Ambien 10 mg nightly Topamax 25 mg nightly

## 2018-07-31 ENCOUNTER — Ambulatory Visit (INDEPENDENT_AMBULATORY_CARE_PROVIDER_SITE_OTHER): Payer: Medicaid Other | Admitting: Licensed Clinical Social Worker

## 2018-07-31 ENCOUNTER — Encounter (HOSPITAL_COMMUNITY): Payer: Self-pay | Admitting: Licensed Clinical Social Worker

## 2018-07-31 DIAGNOSIS — F330 Major depressive disorder, recurrent, mild: Secondary | ICD-10-CM | POA: Diagnosis not present

## 2018-07-31 MED ORDER — SERTRALINE HCL 50 MG PO TABS: 50.0000 mg | ORAL_TABLET | Freq: Every day | ORAL | 0 refills | Status: DC

## 2018-07-31 NOTE — Progress Notes (Signed)
   THERAPIST PROGRESS NOTE  Session Time: 1:10-2pm  Participation Level: Active  Behavioral Response: Casual/Alert/Anxious  Type of Therapy: Individual Therapy  Treatment Goals addressed: Coping  Interventions: CBT  Summary: Erin Bradford is a 47 y.o. female who presents for her individual counseling session. Pt discussed her psychiatric symptoms and current life events. Pt presented anxious and tearful for her session. Pt wanted to talk about her experience with her psychiatrist and medication. Checked with CMA Regan to confirm her medication changed and gave pt information. Pt discussed her moods. She feels they have been up and down. Educated pt how her medications should stabilize her moods. Pt was excited to share she had been on 2 dates recently. Congratulated pt on beginning to live her own life. She is usually so busy taking care of everyone else she has little time for herself. Educated pt on the importance of self care. Pt agreed it was nice to go out and be taken care of. Asked open ended questions. Pt reported she will be in group tomorrow evening and confirmed her next individual appointments.   Suicidal/Homicidal: Nowithout intent/plan  Therapist Response:  Assessed pt's current functioning and reviewed progress. Assist pt processing her medications and moods, educated pt on the importance of self care. Assisted pt processing for the management of her stressors.  Plan: Return again in 1 week and OP group weekly  Diagnosis: Axis I: MDD        MACKENZIE,LISBETH S, LCAS 07/31/2018

## 2018-07-31 NOTE — Telephone Encounter (Signed)
So she wants to go back to the zoloft?  She is welcome to restart her prior dose - the likelihood of weight gain with zoloft is higher than prozac though.

## 2018-07-31 NOTE — Addendum Note (Signed)
Addended by: Dennie Maizes E on: 07/31/2018 01:19 PM   Modules accepted: Orders

## 2018-07-31 NOTE — Telephone Encounter (Signed)
Please see first message, she said that she does not want to take the Prozac, she already contacted her pharmacy and picked up the Wellbutrin - and she wants to start Zoloft to replace the Lamictal that she had to stop due to a rash.

## 2018-08-01 ENCOUNTER — Ambulatory Visit (INDEPENDENT_AMBULATORY_CARE_PROVIDER_SITE_OTHER): Payer: Medicaid Other | Admitting: Licensed Clinical Social Worker

## 2018-08-01 DIAGNOSIS — F330 Major depressive disorder, recurrent, mild: Secondary | ICD-10-CM | POA: Diagnosis not present

## 2018-08-02 ENCOUNTER — Encounter (HOSPITAL_COMMUNITY): Payer: Self-pay | Admitting: Licensed Clinical Social Worker

## 2018-08-02 NOTE — Progress Notes (Signed)
Daily Group Progress Note   Program: OP Evening Group    Group Time: 5:30-6:30pm  Participation Level: Active  Behavioral Response: Appropriate  Type of Therapy:  Psychoeducation/Therapy  Summary of Progress: Pt participated in a discussion on vulnerability. Pt shared she thinks being vulnerable is being weak.  The group discussed how vulnerability is not a sign of weakness, but can be the greatest strength. Vulnerability is not weakness; it's the greatest measure of courage. Pt was encouraged to open up her circle to become more vulnerable.  Alver Fisher 08/01/18

## 2018-08-03 DIAGNOSIS — R7303 Prediabetes: Secondary | ICD-10-CM | POA: Diagnosis not present

## 2018-08-03 DIAGNOSIS — F329 Major depressive disorder, single episode, unspecified: Secondary | ICD-10-CM | POA: Diagnosis not present

## 2018-08-03 DIAGNOSIS — I10 Essential (primary) hypertension: Secondary | ICD-10-CM | POA: Diagnosis not present

## 2018-08-03 DIAGNOSIS — F419 Anxiety disorder, unspecified: Secondary | ICD-10-CM | POA: Diagnosis not present

## 2018-08-03 DIAGNOSIS — Z6841 Body Mass Index (BMI) 40.0 and over, adult: Secondary | ICD-10-CM | POA: Diagnosis not present

## 2018-08-03 DIAGNOSIS — M545 Low back pain: Secondary | ICD-10-CM | POA: Diagnosis not present

## 2018-08-03 DIAGNOSIS — G8929 Other chronic pain: Secondary | ICD-10-CM | POA: Diagnosis not present

## 2018-08-07 ENCOUNTER — Ambulatory Visit (INDEPENDENT_AMBULATORY_CARE_PROVIDER_SITE_OTHER): Payer: Medicaid Other | Admitting: Licensed Clinical Social Worker

## 2018-08-07 ENCOUNTER — Ambulatory Visit: Payer: Self-pay | Admitting: Family Medicine

## 2018-08-07 ENCOUNTER — Encounter (HOSPITAL_COMMUNITY): Payer: Self-pay | Admitting: Licensed Clinical Social Worker

## 2018-08-07 DIAGNOSIS — F330 Major depressive disorder, recurrent, mild: Secondary | ICD-10-CM

## 2018-08-07 DIAGNOSIS — M79604 Pain in right leg: Secondary | ICD-10-CM | POA: Diagnosis not present

## 2018-08-07 DIAGNOSIS — M79605 Pain in left leg: Secondary | ICD-10-CM | POA: Diagnosis not present

## 2018-08-07 DIAGNOSIS — Z79891 Long term (current) use of opiate analgesic: Secondary | ICD-10-CM | POA: Diagnosis not present

## 2018-08-07 DIAGNOSIS — G894 Chronic pain syndrome: Secondary | ICD-10-CM | POA: Diagnosis not present

## 2018-08-07 DIAGNOSIS — M545 Low back pain: Secondary | ICD-10-CM | POA: Diagnosis not present

## 2018-08-07 NOTE — Progress Notes (Signed)
   THERAPIST PROGRESS NOTE  Session Time: 1:10-2pm  Participation Level: Active  Behavioral Response: Casual/Alert/Anxious  Type of Therapy: Individual Therapy  Treatment Goals addressed: Improve psychiatric symptoms, Controlled Behavior, Moderated Mood, Improve Unhelpful Thought Patterns, Emotional Regulation Skills, moderate moods. Feel and express a full Range of Emotions, Learn about Diagnosis, Healthy Coping Skills.  Interventions: CBT  Summary: Erin Bradford is a 47 y.o. female who presents for her individual counseling session. Pt discussed her psychiatric symptoms and current life events. Pt presented anxious and tearful for her session. She is concerned about her daughter in a new school and thinks she is being bullied. Asked open ended questions and used empathic reflection. Assisted pt problem solving. Asked pt how she feels about the problem and what she thinks about the problem (thoughts vs feelings.) She said it was hard to distinguish between the two. Pt has been communicating with her oldest daughter in [place], they have a tenuous relationship. Pt read their texts and discussed thoughts vs feelings. Pt was receptive.  Suicidal/Homicidal: Nowithout intent/plan  Therapist Response:  Assessed pt's current functioning and reviewed progress. Assist pt processing her daughter, problem solving, thoughts vs feelings, communication. Assisted pt processing for the management of her stressors.  Plan: Return again in 1 week and OP group weekly  Diagnosis: Axis I: MDD        Jenkins Rouge, LCAS 08/07/2018

## 2018-08-08 ENCOUNTER — Ambulatory Visit: Payer: Self-pay | Admitting: Family Medicine

## 2018-08-08 ENCOUNTER — Ambulatory Visit (HOSPITAL_COMMUNITY): Payer: Self-pay | Admitting: Licensed Clinical Social Worker

## 2018-08-15 ENCOUNTER — Ambulatory Visit (HOSPITAL_COMMUNITY): Payer: Self-pay | Admitting: Licensed Clinical Social Worker

## 2018-08-21 ENCOUNTER — Ambulatory Visit (HOSPITAL_COMMUNITY): Payer: Self-pay | Admitting: Licensed Clinical Social Worker

## 2018-08-22 ENCOUNTER — Ambulatory Visit (HOSPITAL_COMMUNITY): Payer: Self-pay | Admitting: Licensed Clinical Social Worker

## 2018-08-23 ENCOUNTER — Ambulatory Visit (HOSPITAL_COMMUNITY): Payer: Self-pay | Admitting: Psychiatry

## 2018-08-25 ENCOUNTER — Other Ambulatory Visit (HOSPITAL_COMMUNITY): Payer: Self-pay | Admitting: Psychiatry

## 2018-08-25 DIAGNOSIS — F330 Major depressive disorder, recurrent, mild: Secondary | ICD-10-CM

## 2018-08-29 ENCOUNTER — Ambulatory Visit (HOSPITAL_COMMUNITY): Payer: Self-pay | Admitting: Licensed Clinical Social Worker

## 2018-08-30 ENCOUNTER — Other Ambulatory Visit (HOSPITAL_COMMUNITY): Payer: Self-pay

## 2018-08-30 DIAGNOSIS — F330 Major depressive disorder, recurrent, mild: Secondary | ICD-10-CM

## 2018-08-30 MED ORDER — BUPROPION HCL ER (XL) 300 MG PO TB24: 300.0000 mg | ORAL_TABLET | Freq: Every day | ORAL | 1 refills | Status: DC

## 2018-08-30 MED ORDER — SERTRALINE HCL 50 MG PO TABS: 50.0000 mg | ORAL_TABLET | Freq: Every day | ORAL | 1 refills | Status: DC

## 2018-09-04 DIAGNOSIS — G894 Chronic pain syndrome: Secondary | ICD-10-CM | POA: Diagnosis not present

## 2018-09-04 DIAGNOSIS — M79605 Pain in left leg: Secondary | ICD-10-CM | POA: Diagnosis not present

## 2018-09-04 DIAGNOSIS — M79604 Pain in right leg: Secondary | ICD-10-CM | POA: Diagnosis not present

## 2018-09-04 DIAGNOSIS — Z79891 Long term (current) use of opiate analgesic: Secondary | ICD-10-CM | POA: Diagnosis not present

## 2018-09-04 DIAGNOSIS — M545 Low back pain: Secondary | ICD-10-CM | POA: Diagnosis not present

## 2018-09-05 ENCOUNTER — Ambulatory Visit (HOSPITAL_COMMUNITY): Payer: Self-pay | Admitting: Licensed Clinical Social Worker

## 2018-09-11 ENCOUNTER — Encounter (HOSPITAL_COMMUNITY): Payer: Self-pay | Admitting: Licensed Clinical Social Worker

## 2018-09-11 ENCOUNTER — Ambulatory Visit (INDEPENDENT_AMBULATORY_CARE_PROVIDER_SITE_OTHER): Payer: Medicaid Other | Admitting: Licensed Clinical Social Worker

## 2018-09-11 DIAGNOSIS — F330 Major depressive disorder, recurrent, mild: Secondary | ICD-10-CM

## 2018-09-11 NOTE — Progress Notes (Signed)
   THERAPIST PROGRESS NOTE  Session Time: 1:10-2pm  Participation Level: Active  Behavioral Response: Casual/Alert/Anxious  Type of Therapy: Individual Therapy  Treatment Goals addressed: Improve psychiatric symptoms, Controlled Behavior, Moderated Mood, Improve Unhelpful Thought Patterns, Emotional Regulation Skills, moderate moods. Feel and express a full Range of Emotions, Learn about Diagnosis, Healthy Coping Skills.  Interventions: CBT  Summary: Erin Bradford is a 47 y.o. female who presents for her individual counseling session. Pt discussed her psychiatric symptoms and current life events. Pt presented anxious and tearful for her session. She has been feeling overwhelmed with her perception of all she has to do in her life. Assisted pt breaking down her responsibilities vs her perception of responsibilities. Pt continues to have conflict with her daughter in [place]. Pt was tearful in her description of the conflict. Educated pt on conflict resolution. Pt also exhibited low frustration tolerance. Educated pt on effective skills. Suggested to pt how journaling would assist with dealing with some of her strong feelings.      Suicidal/Homicidal: Nowithout intent/plan  Therapist Response:  Assessed pt's current functioning and reviewed progress. Assist pt processing her daughter, conflict resolutions, low frustration tolerance and skills. Assisted pt processing for the management of her stressors.  Plan: Return again in 2 weeks  Diagnosis: Axis I: MDD        Jenkins Rouge, LCAS 09/11/2018

## 2018-09-12 ENCOUNTER — Ambulatory Visit (HOSPITAL_COMMUNITY): Payer: Self-pay | Admitting: Licensed Clinical Social Worker

## 2018-09-12 DIAGNOSIS — F54 Psychological and behavioral factors associated with disorders or diseases classified elsewhere: Secondary | ICD-10-CM | POA: Diagnosis not present

## 2018-09-12 DIAGNOSIS — Z7189 Other specified counseling: Secondary | ICD-10-CM | POA: Diagnosis not present

## 2018-09-13 ENCOUNTER — Ambulatory Visit (HOSPITAL_COMMUNITY): Payer: Self-pay | Admitting: Psychiatry

## 2018-09-13 DIAGNOSIS — E876 Hypokalemia: Secondary | ICD-10-CM | POA: Diagnosis not present

## 2018-09-13 DIAGNOSIS — Z6841 Body Mass Index (BMI) 40.0 and over, adult: Secondary | ICD-10-CM | POA: Diagnosis not present

## 2018-09-13 DIAGNOSIS — G8929 Other chronic pain: Secondary | ICD-10-CM | POA: Diagnosis not present

## 2018-09-13 DIAGNOSIS — R7989 Other specified abnormal findings of blood chemistry: Secondary | ICD-10-CM | POA: Diagnosis not present

## 2018-09-13 DIAGNOSIS — I129 Hypertensive chronic kidney disease with stage 1 through stage 4 chronic kidney disease, or unspecified chronic kidney disease: Secondary | ICD-10-CM | POA: Diagnosis not present

## 2018-09-13 DIAGNOSIS — Z23 Encounter for immunization: Secondary | ICD-10-CM | POA: Diagnosis not present

## 2018-09-13 DIAGNOSIS — F419 Anxiety disorder, unspecified: Secondary | ICD-10-CM | POA: Diagnosis not present

## 2018-09-14 ENCOUNTER — Ambulatory Visit (INDEPENDENT_AMBULATORY_CARE_PROVIDER_SITE_OTHER): Payer: Medicaid Other | Admitting: Psychiatry

## 2018-09-14 ENCOUNTER — Encounter (HOSPITAL_COMMUNITY): Payer: Self-pay | Admitting: Psychiatry

## 2018-09-14 ENCOUNTER — Ambulatory Visit (HOSPITAL_COMMUNITY): Payer: Medicaid Other | Admitting: Psychiatry

## 2018-09-14 DIAGNOSIS — R7303 Prediabetes: Secondary | ICD-10-CM | POA: Diagnosis not present

## 2018-09-14 DIAGNOSIS — F5101 Primary insomnia: Secondary | ICD-10-CM | POA: Diagnosis not present

## 2018-09-14 DIAGNOSIS — F330 Major depressive disorder, recurrent, mild: Secondary | ICD-10-CM | POA: Diagnosis not present

## 2018-09-14 DIAGNOSIS — R5383 Other fatigue: Secondary | ICD-10-CM | POA: Diagnosis not present

## 2018-09-14 DIAGNOSIS — F321 Major depressive disorder, single episode, moderate: Secondary | ICD-10-CM | POA: Diagnosis not present

## 2018-09-14 DIAGNOSIS — I10 Essential (primary) hypertension: Secondary | ICD-10-CM | POA: Diagnosis not present

## 2018-09-14 DIAGNOSIS — E559 Vitamin D deficiency, unspecified: Secondary | ICD-10-CM | POA: Diagnosis not present

## 2018-09-14 DIAGNOSIS — D500 Iron deficiency anemia secondary to blood loss (chronic): Secondary | ICD-10-CM | POA: Diagnosis not present

## 2018-09-14 DIAGNOSIS — Z6839 Body mass index (BMI) 39.0-39.9, adult: Secondary | ICD-10-CM | POA: Diagnosis not present

## 2018-09-14 DIAGNOSIS — E669 Obesity, unspecified: Secondary | ICD-10-CM | POA: Diagnosis not present

## 2018-09-14 MED ORDER — ZOLPIDEM TARTRATE 10 MG PO TABS: ORAL_TABLET | ORAL | 2 refills | Status: DC

## 2018-09-14 MED ORDER — TOPIRAMATE 25 MG PO TABS: 25.0000 mg | ORAL_TABLET | Freq: Every day | ORAL | 0 refills | Status: DC

## 2018-09-14 MED ORDER — BUPROPION HCL ER (XL) 300 MG PO TB24: 300.0000 mg | ORAL_TABLET | Freq: Every day | ORAL | 0 refills | Status: DC

## 2018-09-14 MED ORDER — SERTRALINE HCL 50 MG PO TABS: 75.0000 mg | ORAL_TABLET | Freq: Every day | ORAL | 0 refills | Status: DC

## 2018-09-14 NOTE — Progress Notes (Signed)
BH MD/PA/NP OP Progress Note  9/67/8938 1:01 AM Erin Bradford  MRN:  [id number]  Chief Complaint: I still have anxiety and nervousness.  HPI: Patient came for her follow-up appointment.  She saw Dr. Sharlene Dory in my absence who change her medication.  She was recommended to try Prozac but she recall having a weight gain in the past from Prozac and she never picked up the medication.  She is taking Wellbutrin Zoloft and Dr. Sharlene Dory recommended to try low-dose Topamax for weight loss.  She is no longer taking phentermine.  She is taking Ambien which is helping her a lot for sleep.  She had blood work and find out that she has low vitamin D.  She is taking vitamin D is helping her energy.  We have tried Lamictal but she developed a rash with the Lamictal.  Overall she describes her mood is stable but she gets some time anxious and nervousness.  She denies any irritability, anger, mania or any psychosis.  She is seriously thinking about gastric bypass and currently she is enrolled in weight loss program.  She wants to discuss about bariatric surgery.  Patient denies any paranoia, hallucination, suicidal thoughts or homicidal thought.  She is very happy that her 49 year old daughter like her school.  She lost 7 pounds since her last visit.  Her energy level is improved.  Patient denies drinking or using any illegal substances.  She is seeing Charolotte Eke for therapy.  Visit Diagnosis:    ICD-10-CM   1. Primary insomnia F51.01 zolpidem (AMBIEN) 10 MG tablet    topiramate (TOPAMAX) 25 MG tablet  2. MDD (major depressive disorder), recurrent episode, mild (HCC) F33.0 topiramate (TOPAMAX) 25 MG tablet    buPROPion (WELLBUTRIN XL) 300 MG 24 hr tablet  3. Iron deficiency anemia due to chronic blood loss D50.0 sertraline (ZOLOFT) 50 MG tablet    Past Psychiatric History: Reviewed. Patient denies any history of psychiatric inpatient treatment, mania, psychosis or any suicidal attempt.  In the past she tried  Prozac which works for few hours and then it stopped working and caused weight gain.  She tried Vistaril, trazodone, Thorazine and Lunesta for insomnia with poor outcome.  We have tried Lamictal but that caused a rash. She was recommended to do intensive outpatient program at [facility] but she never went there.  Past Medical History:  Past Medical History:  Diagnosis Date  . Abnormal laboratory test   . Allergy   . Anemia   . Anxiety   . Arthritis   . Blood transfusion without reported diagnosis   . BMI 39.0-39.9,adult   . Chronic hypokalemia   . Chronic kidney disease    mild  . Depression   . GERD (gastroesophageal reflux disease)   . Hypertension   . Insomnia   . Low back pain   . Obesity   . Occasional tremors   . Palpitations    dx with tachycardia   . PONV (postoperative nausea and vomiting)   . Tiredness     Past Surgical History:  Procedure Laterality Date  . CESAREAN SECTION    . TOOTH EXTRACTION    . TOTAL LAPAROSCOPIC HYSTERECTOMY WITH SALPINGECTOMY Bilateral 10/06/2017   Procedure: ATTEMPTED HYSTERECTOMY TOTAL LAPAROSCOPIC WITH SALPINGECTOMY CONVERTED TO OPEN ABDOMINAL TOTAL HYSTERECTOMY WITH SALPINGECTOMY;  Surgeon: Sherlyn Hay, DO;  Location: [facility];  Service: Gynecology;  Laterality: Bilateral;    Family Psychiatric History: Reviewed   Family History:  Family History  Problem Relation Age of Onset  . Diabetes Mother   . Hypertension Mother   . Hyperlipidemia Mother   . Sarcoidosis Mother   . Kidney disease Father   . Atrial fibrillation Sister   . Kidney disease Maternal Grandmother   . Pancreatic cancer Maternal Grandmother   . Diabetes Maternal Grandmother   . Hypertension Maternal Grandmother   . Cancer Maternal Grandfather   . Colon cancer Neg Hx   . Colon polyps Neg Hx   . Esophageal cancer Neg Hx   . Stomach cancer Neg Hx   . Rectal cancer Neg Hx     Social History:  Social History    Socioeconomic History  . Marital status: Widowed    Spouse name: Not on file  . Number of children: Not on file  . Years of education: Not on file  . Highest education level: Not on file  Occupational History  . Not on file  Social Needs  . Financial resource strain: Not on file  . Food insecurity:    Worry: Not on file    Inability: Not on file  . Transportation needs:    Medical: Not on file    Non-medical: Not on file  Tobacco Use  . Smoking status: Never Smoker  . Smokeless tobacco: Never Used  Substance and Sexual Activity  . Alcohol use: No  . Drug use: No  . Sexual activity: Never    Birth control/protection: Condom  Lifestyle  . Physical activity:    Days per week: Not on file    Minutes per session: Not on file  . Stress: Not on file  Relationships  . Social connections:    Talks on phone: Not on file    Gets together: Not on file    Attends religious service: Not on file    Active member of club or organization: Not on file    Attends meetings of clubs or organizations: Not on file    Relationship status: Not on file  Other Topics Concern  . Not on file  Social History Narrative  . Not on file    Allergies:  Allergies  Allergen Reactions  . Ace Inhibitors Anaphylaxis and Swelling    Swelling of tongue.    Metabolic Disorder Labs: Lab Results  Component Value Date   HGBA1C 5.8 (A) 07/05/2018   No results found for: PROLACTIN Lab Results  Component Value Date   CHOL 183 07/05/2018   TRIG 156 (H) 07/05/2018   HDL 38 (L) 07/05/2018   CHOLHDL 4.8 (H) 07/05/2018   LDLCALC 114 (H) 07/05/2018   Lab Results  Component Value Date   TSH 1.660 07/05/2018   TSH 1.05 12/16/2017    Therapeutic Level Labs: No results found for: LITHIUM No results found for: VALPROATE No components found for:  CBMZ  Current Medications: Current Outpatient Medications  Medication Sig Dispense Refill  . ACCU-CHEK AVIVA PLUS test strip USE TO CHECK BLOOD SUGAR  TWICE WEEKLY  11  . buPROPion (WELLBUTRIN XL) 300 MG 24 hr tablet Take 1 tablet (300 mg total) by mouth daily. 30 tablet 1  . carvedilol (COREG) 12.5 MG tablet Take 1 & 1/2 tablets (18.75 mg total) by mouth twice daily 270 tablet 3  . cetirizine (ZYRTEC) 10 MG tablet Take 1 tablet (10 mg total) by mouth at bedtime as needed for allergies. 30 tablet 2  . Fluocinolone Acetonide Body 0.01 % OIL Place 2 drops into both ears 2 (two) times daily as needed (  for ear pain).     . fluticasone (FLONASE) 50 MCG/ACT nasal spray Place 2 sprays into both nostrils daily. 16 g 2  . furosemide (LASIX) 20 MG tablet Take 20 mg by mouth 2 (two) times daily.     Marland Kitchen gabapentin (NEURONTIN) 300 MG capsule Take 300 mg by mouth 3 (three) times daily.     . Glucose Blood (BLOOD GLUCOSE TEST STRIPS) STRP 2 (two) times a week.    Marland Kitchen HYDROcodone-acetaminophen (NORCO) 10-325 MG tablet Take 1 tablet by mouth 4 (four) times daily.     Marland Kitchen losartan (COZAAR) 100 MG tablet Take 100 mg by mouth daily.    . methocarbamol (ROBAXIN) 750 MG tablet Take 750 mg by mouth 3 (three) times daily.  5  . primidone (MYSOLINE) 50 MG tablet TAKE 6 TABLETS(300 MG) BY MOUTH AT BEDTIME 180 tablet 6  . sertraline (ZOLOFT) 50 MG tablet Take 1 tablet (50 mg total) by mouth daily. 30 tablet 0  . spironolactone (ALDACTONE) 50 MG tablet Take 50 mg by mouth 2 (two) times daily.    Marland Kitchen topiramate (TOPAMAX) 25 MG tablet Take 1 tablet (25 mg total) by mouth at bedtime. Take with dinner 90 tablet 0  . Vitamin D, Ergocalciferol, (DRISDOL) 50000 units CAPS capsule Take 1 capsule (50,000 Units total) by mouth every 7 (seven) days. 5 capsule 2  . zolpidem (AMBIEN) 10 MG tablet TAKE 1 TABLET BY MOUTH EVERY NIGHT AT BEDTIME AS NEEDED FOR INSOMNIA 30 tablet 1   No current facility-administered medications for this visit.      Musculoskeletal: Strength & Muscle Tone: within normal limits Gait & Station: normal Patient leans: N/A  Psychiatric Specialty Exam: Review of  Systems  Constitutional: Positive for weight loss.  HENT: Negative.   Respiratory: Negative.   Cardiovascular: Negative.   Gastrointestinal: Negative.   Skin: Negative for itching and rash.  Neurological: Negative.   Psychiatric/Behavioral: The patient is nervous/anxious. The patient does not have insomnia.     Blood pressure 134/87, pulse 92, height 5\' 7"  (1.702 m), weight 251 lb (113.9 kg), last menstrual period 09/17/2017.Body mass index is 39.31 kg/m.  General Appearance: Casual  Eye Contact:  Good  Speech:  Clear and Coherent  Volume:  Normal  Mood:  Euthymic  Affect:  Appropriate  Thought Process:  Goal Directed  Orientation:  Full (Time, Place, and Person)  Thought Content: Logical   Suicidal Thoughts:  No  Homicidal Thoughts:  No  Memory:  Immediate;   Good Recent;   Good Remote;   Good  Judgement:  Good  Insight:  Good  Psychomotor Activity:  Normal  Concentration:  Concentration: Good and Attention Span: Good  Recall:  Good  Fund of Knowledge: Good  Language: Good  Akathisia:  No  Handed:  Right  AIMS (if indicated): not done  Assets:  Communication Skills Desire for Improvement Housing Resilience Social Support  ADL's:  Intact  Cognition: WNL  Sleep:  Good   Screenings: PHQ2-9     Office Visit from 07/05/2018 in [facility] from 05/09/2018 in [facility]  PHQ-2 Total Score  4  6  PHQ-9 Total Score  8  14       Assessment and Plan: Major depressive disorder, recurrent.  Primary insomnia.  Obesity.  Anxiety disorder NOS.  Discussed her anxiety and weight loss program.  Patient is exploring bariatric surgery and she wants to spend time to discuss about her psychiatric medication after  the surgery.  I talked in length that her psychiatric medication may change after the surgery.  Encourage to join the groups and blog with the patient who had post gastric bypass.  I also recommended to try  Zoloft 75 mg to help her anxiety.  She has no longer sweating with her Zoloft.  Continue Wellbutrin XL 300 mg daily, continue Topamax 25 mg to help with weight loss, she had lost 7 pounds since the last visit.  Continue Ambien 10 mg for insomnia and recommended to continue seeing Charolotte Eke for counseling.  Recommended to call us back if he has any question or any concern.  Discussed safety concerns at any time having active suicidal thoughts or homicidal thought and she need to call 911 or go to local emergency room.  Time spent 25 minutes.  50% of the time spent in psychoeducation, coordination of care, discussing medication side effects and reviewing blood work.  Follow-up in 3 months.     [name], MD 09/14/2018, 8:43 AM

## 2018-09-18 ENCOUNTER — Ambulatory Visit (HOSPITAL_COMMUNITY): Payer: Self-pay | Admitting: Licensed Clinical Social Worker

## 2018-09-19 ENCOUNTER — Ambulatory Visit (HOSPITAL_COMMUNITY): Payer: Self-pay | Admitting: Licensed Clinical Social Worker

## 2018-09-20 ENCOUNTER — Telehealth: Payer: Self-pay | Admitting: Oncology

## 2018-09-20 NOTE — Telephone Encounter (Signed)
FS CALL DAY - moved 10/8 appointments 10/9. Spoke with patient.

## 2018-09-25 ENCOUNTER — Ambulatory Visit (INDEPENDENT_AMBULATORY_CARE_PROVIDER_SITE_OTHER): Payer: Medicaid Other | Admitting: Licensed Clinical Social Worker

## 2018-09-25 ENCOUNTER — Encounter (HOSPITAL_COMMUNITY): Payer: Self-pay | Admitting: Licensed Clinical Social Worker

## 2018-09-25 DIAGNOSIS — F330 Major depressive disorder, recurrent, mild: Secondary | ICD-10-CM | POA: Diagnosis not present

## 2018-09-25 NOTE — Progress Notes (Signed)
   THERAPIST PROGRESS NOTE  Session Time: 1:10-2pm  Participation Level: Active  Behavioral Response: Casual/Alert/Anxious  Type of Therapy: Individual Therapy  Treatment Goals addressed: Improve psychiatric symptoms, Controlled Behavior, Moderated Mood, Improve Unhelpful Thought Patterns, Emotional Regulation Skills, moderate moods. Feel and express a full Range of Emotions, Learn about Diagnosis, Healthy Coping Skills.  Interventions: CBT/TX plan update  Summary: Erin Bradford is a 47 y.o. female who presents for her individual counseling session. Pt discussed her psychiatric symptoms and current life events. Pt presented anxious and tearful for her session. Pt and her mother have struggled with communication within the last few weeks. This is not a normal pattern for them. Taught pt effective communication skills. Pt has been using passive aggressive communication style. Reviewed tx plan with pt. Pt has been thinking about going back to school. Discussed the risks vs benefits of school. Assisted pt making a SMART goal with strategies.      Suicidal/Homicidal: Nowithout intent/plan  Therapist Response:  Assessed pt's current functioning and reviewed progress. Assisted pt with conflict resolution and effective communication skills, reviewed tx plan, goal setting with strategies. Assisted pt processing for the management of her stressors.  Plan: Return again in 2 weeks Sign [place] plan  Diagnosis: Axis I: MDD        Brynja Marker S, LCAS 09/25/2018

## 2018-09-26 ENCOUNTER — Ambulatory Visit (HOSPITAL_COMMUNITY): Payer: Self-pay | Admitting: Licensed Clinical Social Worker

## 2018-10-02 ENCOUNTER — Ambulatory Visit (HOSPITAL_COMMUNITY): Payer: Self-pay | Admitting: Licensed Clinical Social Worker

## 2018-10-03 ENCOUNTER — Ambulatory Visit: Payer: Self-pay | Admitting: Oncology

## 2018-10-03 ENCOUNTER — Other Ambulatory Visit: Payer: Self-pay

## 2018-10-03 ENCOUNTER — Ambulatory Visit (HOSPITAL_COMMUNITY): Payer: Self-pay | Admitting: Licensed Clinical Social Worker

## 2018-10-03 ENCOUNTER — Ambulatory Visit (INDEPENDENT_AMBULATORY_CARE_PROVIDER_SITE_OTHER): Payer: Medicaid Other | Admitting: Licensed Clinical Social Worker

## 2018-10-03 DIAGNOSIS — F330 Major depressive disorder, recurrent, mild: Secondary | ICD-10-CM

## 2018-10-04 ENCOUNTER — Inpatient Hospital Stay: Payer: Medicaid Other | Attending: Oncology

## 2018-10-04 ENCOUNTER — Telehealth: Payer: Self-pay | Admitting: Oncology

## 2018-10-04 ENCOUNTER — Inpatient Hospital Stay (HOSPITAL_BASED_OUTPATIENT_CLINIC_OR_DEPARTMENT_OTHER): Payer: Medicaid Other | Admitting: Oncology

## 2018-10-04 ENCOUNTER — Encounter (HOSPITAL_COMMUNITY): Payer: Self-pay | Admitting: Licensed Clinical Social Worker

## 2018-10-04 VITALS — BP 135/83 | HR 70 | Temp 98.3°F | Resp 17 | Ht 67.0 in | Wt 249.1 lb

## 2018-10-04 DIAGNOSIS — N920 Excessive and frequent menstruation with regular cycle: Secondary | ICD-10-CM | POA: Diagnosis not present

## 2018-10-04 DIAGNOSIS — D500 Iron deficiency anemia secondary to blood loss (chronic): Secondary | ICD-10-CM | POA: Insufficient documentation

## 2018-10-04 DIAGNOSIS — Z90710 Acquired absence of both cervix and uterus: Secondary | ICD-10-CM | POA: Diagnosis not present

## 2018-10-04 DIAGNOSIS — Z79899 Other long term (current) drug therapy: Secondary | ICD-10-CM

## 2018-10-04 LAB — CBC WITH DIFFERENTIAL (CANCER CENTER ONLY)
Abs Immature Granulocytes: 0.01 10*3/uL (ref 0.00–0.07)
Basophils Absolute: 0 10*3/uL (ref 0.0–0.1)
Basophils Relative: 1 %
EOS PCT: 4 %
Eosinophils Absolute: 0.2 10*3/uL (ref 0.0–0.5)
HCT: 38.7 % (ref 36.0–46.0)
HEMOGLOBIN: 13.1 g/dL (ref 12.0–15.0)
Immature Granulocytes: 0 %
LYMPHS PCT: 32 %
Lymphs Abs: 1.8 10*3/uL (ref 0.7–4.0)
MCH: 30.2 pg (ref 26.0–34.0)
MCHC: 33.9 g/dL (ref 30.0–36.0)
MCV: 89.2 fL (ref 80.0–100.0)
MONO ABS: 0.6 10*3/uL (ref 0.1–1.0)
Monocytes Relative: 10 %
Neutro Abs: 3.1 10*3/uL (ref 1.7–7.7)
Neutrophils Relative %: 53 %
Platelet Count: 381 10*3/uL (ref 150–400)
RBC: 4.34 MIL/uL (ref 3.87–5.11)
RDW: 12 % (ref 11.5–15.5)
WBC: 5.8 10*3/uL (ref 4.0–10.5)
nRBC: 0 % (ref 0.0–0.2)

## 2018-10-04 LAB — IRON AND TIBC
Iron: 52 ug/dL (ref 41–142)
Saturation Ratios: 19 % — ABNORMAL LOW (ref 21–57)
TIBC: 271 ug/dL (ref 236–444)
UIBC: 219 ug/dL

## 2018-10-04 LAB — FERRITIN: FERRITIN: 332 ng/mL — AB (ref 11–307)

## 2018-10-04 NOTE — Progress Notes (Signed)
Hematology and Oncology Follow Up Visit  Erin Bradford [id number] [birth year] 47 y.o. 10/04/2018 1:23 PM Azzie Glatter, FNPMedicine, [facility]*   Principle Diagnosis: 47 year old woman with iron deficiency anemia related to menstrual bleeding diagnosed in February 2018.     Prior Therapy:  She status post Feraheme infusion for a total of 1000 mg completed in February 2018. She is status post hysterectomy completed on 10/06/2017.  Current therapy: Repeat IV iron infusion as needed.  Interim History: Erin Bradford returns today for a follow-up.  Since the last visit, she reports no major changes or complaints.  She continues to be active and attends to activities of daily living.  She denies any recent hospitalization or illnesses.  She denies any worsening fatigue or tiredness.  She denies any hematochezia or melena.  Her performance status and activity level remain excellent.  She does not report any blurry vision, syncope or seizures.  She denies any alteration in mental status or lethargy.  She does not report any fevers, chills, weight loss. She does not report any chest pain, palpitation, orthopnea or leg edema. She does not report any cough, wheezing or hemoptysis.   She does not report any nausea, vomiting or abdominal pain.  Denies any change in bowel habits.  She does not report any dysuria or dysuria. She does not report any bone pain.  Does not report any mood changes.  Remaining review of systems is negative.  Medications: I have reviewed the patient's current medications.  Current Outpatient Medications  Medication Sig Dispense Refill  . ACCU-CHEK AVIVA PLUS test strip USE TO CHECK BLOOD SUGAR TWICE WEEKLY  11  . buPROPion (WELLBUTRIN XL) 300 MG 24 hr tablet Take 1 tablet (300 mg total) by mouth daily. 90 tablet 0  . carvedilol (COREG) 12.5 MG tablet Take 1 & 1/2 tablets (18.75 mg total) by mouth twice daily 270 tablet 3  . cetirizine (ZYRTEC) 10 MG tablet Take 1 tablet  (10 mg total) by mouth at bedtime as needed for allergies. 30 tablet 2  . Fluocinolone Acetonide Body 0.01 % OIL Place 2 drops into both ears 2 (two) times daily as needed (for ear pain).     . fluticasone (FLONASE) 50 MCG/ACT nasal spray Place 2 sprays into both nostrils daily. 16 g 2  . furosemide (LASIX) 20 MG tablet Take 20 mg by mouth 2 (two) times daily.     Marland Kitchen gabapentin (NEURONTIN) 300 MG capsule Take 300 mg by mouth 3 (three) times daily.     . Glucose Blood (BLOOD GLUCOSE TEST STRIPS) STRP 2 (two) times a week.    Marland Kitchen HYDROcodone-acetaminophen (NORCO) 10-325 MG tablet Take 1 tablet by mouth 4 (four) times daily.     Marland Kitchen losartan (COZAAR) 100 MG tablet Take 100 mg by mouth daily.    . methocarbamol (ROBAXIN) 750 MG tablet Take 750 mg by mouth 3 (three) times daily.  5  . primidone (MYSOLINE) 50 MG tablet TAKE 6 TABLETS(300 MG) BY MOUTH AT BEDTIME 180 tablet 6  . sertraline (ZOLOFT) 50 MG tablet Take 1.5 tablets (75 mg total) by mouth daily. 135 tablet 0  . spironolactone (ALDACTONE) 50 MG tablet Take 50 mg by mouth 2 (two) times daily.    Marland Kitchen topiramate (TOPAMAX) 25 MG tablet Take 1 tablet (25 mg total) by mouth at bedtime. Take with dinner 90 tablet 0  . Vitamin D, Ergocalciferol, (DRISDOL) 50000 units CAPS capsule Take 1 capsule (50,000 Units total) by mouth every 7 (seven)  days. 5 capsule 2  . zolpidem (AMBIEN) 10 MG tablet TAKE 1 TABLET BY MOUTH EVERY NIGHT AT BEDTIME AS NEEDED FOR INSOMNIA 30 tablet 2   No current facility-administered medications for this visit.      Allergies:  Allergies  Allergen Reactions  . Ace Inhibitors Anaphylaxis and Swelling    Swelling of tongue.    Past Medical History, Surgical history, Social history, and Family History reviewed and unchanged.   Physical Exam:  Blood pressure 135/83, pulse 70, temperature 98.3 F (36.8 C), temperature source Oral, resp. rate 17, height 5\' 7"  (1.702 m), weight 249 lb 1.6 oz (113 kg), last menstrual period  09/17/2017, SpO2 98 %.    ECOG: 0    General appearance: Comfortable appearing without any discomfort Head: Normocephalic without any trauma Oropharynx: Mucous membranes are moist and pink without any thrush or ulcers. Eyes: Pupils are equal and round reactive to light. Lymph nodes: No cervical, supraclavicular, inguinal or axillary lymphadenopathy.   Heart:regular rate and rhythm.  S1 and S2 without leg edema. Lung: Clear without any rhonchi or wheezes.  No dullness to percussion. Abdomin: Soft, nontender, nondistended with good bowel sounds.  No hepatosplenomegaly. Musculoskeletal: No joint deformity or effusion.  Full range of motion noted. Neurological: No deficits noted on motor, sensory and deep tendon reflex exam. Skin: No petechial rash or dryness.  Appeared moist.  e.   Lab Results: Lab Results  Component Value Date   WBC 5.8 10/04/2018   HGB 13.1 10/04/2018   HCT 38.7 10/04/2018   MCV 89.2 10/04/2018   PLT 381 10/04/2018     Chemistry      Component Value Date/Time   NA 138 07/19/2018 2238   NA 136 07/05/2018 1401   NA 138 02/04/2017 1438   K 3.8 07/19/2018 2238   K 3.8 02/04/2017 1438   CL 101 07/19/2018 2238   CO2 28 07/19/2018 2238   CO2 26 02/04/2017 1438   BUN 9 07/19/2018 2238   BUN 7 07/05/2018 1401   BUN 8.9 02/04/2017 1438   CREATININE 1.05 (H) 07/19/2018 2238   CREATININE 1.0 02/04/2017 1438      Component Value Date/Time   CALCIUM 9.4 07/19/2018 2238   CALCIUM 9.2 02/04/2017 1438   ALKPHOS 94 07/05/2018 1401   ALKPHOS 90 02/04/2017 1438   AST 19 07/05/2018 1401   AST 17 02/04/2017 1438   ALT 16 07/05/2018 1401   ALT 20 02/04/2017 1438   BILITOT 0.3 07/05/2018 1401   BILITOT <0.22 02/04/2017 1438          Impression and Plan:  47 year old woman with the following issues:  1. Iron deficiency anemia related to chronic menstrual losses dating back in 2018.  Her hemoglobin and iron studies were reviewed today and discussed with  the patient detail.  Her hemoglobin continues to be within normal range and ferritin level has been normal previously and currently pending from today.  At this time, she does not require any repeat intravenous iron unless she develop worsening iron deficiency in the future.  The fact that she had a hysterectomy eliminated the major source of her iron deficiency and would likely require less intravenous iron treatments.     2. Menorrhagia: Has been eliminated at this time since her hysterectomy.  No other bleeding.  3. Colon cancer screening: Continue to encourage her to proceed with colonoscopy given her age as well as history of iron deficiency.  4. Follow-up: Will be in 4 months to repeat  iron studies.  15  minutes was spent with the patient face-to-face today.  More than 50% of time was dedicated to reviewing laboratory data, differential diagnosis and managing plan of care.Zola Button, MD 10/9/20191:23 PM

## 2018-10-04 NOTE — Telephone Encounter (Signed)
Appts scheduled avs/calendar printed per 10/9 los °

## 2018-10-04 NOTE — Progress Notes (Signed)
Daily Group Progress Note Program:  Outpatient Evening Group  Group Time: 5:30-6:30pm   Participation Level: Active   Behavioral Response: Appropriate   Type of Therapy:  Psychoeducation/Therapy  Summary of Progress:  Pt participated in a discussion on current stressors. Pt identified her major stressor: money, not having my own place to live, which led the discussion to manageable coping skills. Pt was encouraged to continue using her coping techniques.  Alver Fisher, LCAS

## 2018-10-05 ENCOUNTER — Telehealth: Payer: Self-pay

## 2018-10-05 NOTE — Telephone Encounter (Signed)
Patient states that she will be at appointment at 120pm.

## 2018-10-06 ENCOUNTER — Ambulatory Visit (INDEPENDENT_AMBULATORY_CARE_PROVIDER_SITE_OTHER): Payer: Medicaid Other | Admitting: Family Medicine

## 2018-10-06 ENCOUNTER — Encounter: Payer: Self-pay | Admitting: Family Medicine

## 2018-10-06 VITALS — BP 142/70 | HR 80 | Temp 98.3°F | Ht 64.0 in | Wt 247.0 lb

## 2018-10-06 DIAGNOSIS — E559 Vitamin D deficiency, unspecified: Secondary | ICD-10-CM | POA: Diagnosis not present

## 2018-10-06 DIAGNOSIS — R634 Abnormal weight loss: Secondary | ICD-10-CM

## 2018-10-06 DIAGNOSIS — J302 Other seasonal allergic rhinitis: Secondary | ICD-10-CM | POA: Diagnosis not present

## 2018-10-06 DIAGNOSIS — I10 Essential (primary) hypertension: Secondary | ICD-10-CM | POA: Diagnosis not present

## 2018-10-06 DIAGNOSIS — Z09 Encounter for follow-up examination after completed treatment for conditions other than malignant neoplasm: Secondary | ICD-10-CM | POA: Diagnosis not present

## 2018-10-06 LAB — POCT URINALYSIS DIP (MANUAL ENTRY)
Bilirubin, UA: NEGATIVE
Blood, UA: NEGATIVE
Glucose, UA: NEGATIVE mg/dL
Ketones, POC UA: NEGATIVE mg/dL
Leukocytes, UA: NEGATIVE
Nitrite, UA: NEGATIVE
Protein Ur, POC: NEGATIVE mg/dL
Spec Grav, UA: 1.02 (ref 1.010–1.025)
Urobilinogen, UA: 0.2 E.U./dL
pH, UA: 6 (ref 5.0–8.0)

## 2018-10-06 MED ORDER — VITAMIN D (ERGOCALCIFEROL) 1.25 MG (50000 UNIT) PO CAPS: 50000.0000 [IU] | ORAL_CAPSULE | ORAL | 3 refills | Status: DC

## 2018-10-06 MED ORDER — CETIRIZINE HCL 10 MG PO TABS: 10.0000 mg | ORAL_TABLET | Freq: Every evening | ORAL | 6 refills | Status: DC | PRN

## 2018-10-06 MED ORDER — FLUTICASONE PROPIONATE 50 MCG/ACT NA SUSP: 2.0000 | Freq: Every day | NASAL | 6 refills | Status: DC

## 2018-10-06 NOTE — Progress Notes (Signed)
Follow Up  Subjective:    Patient ID: Erin Bradford, female    DOB: 06-21-1971, 47 y.o.   MRN: [id number]   Chief Complaint  Patient presents with  . Follow-up    3 month  chronic condition    HPI  Erin Bradford is a 47 year old female with a past medical history of Palpitations, Occasional Tremors, Obesity, Low Back Pain, Insomnia, Hypertension, GERD, Depression, CKD, Chronic Hypokalemia, Arthritis, Anxiety, Anemia, and Allergy. She is here today for follow up.  Current Status: Since her last office visit, she is doing well with no complaints. She has a weight loss of over 20 lbs. She is currently scheduled for Gastric Sleeve at the end of November 2019.   She denies fevers, chills, fatigue, recent infections, weight loss, and night sweats. She has not had any headaches, visual changes, dizziness, and falls. No chest pain, heart palpitations, cough and shortness of breath reported. No reports of GI problems such as nausea, vomiting, diarrhea, and constipation. She has no reports of blood in stools, dysuria and hematuria. No depression or anxiety, and denies suicidal ideations, homicidal ideations, or auditory hallucinations. She denies pain today.   Past Medical History:  Diagnosis Date  . Abnormal laboratory test   . Allergy   . Anemia   . Anxiety   . Arthritis   . Blood transfusion without reported diagnosis   . BMI 39.0-39.9,adult   . Chronic hypokalemia   . Chronic kidney disease    mild  . Depression   . GERD (gastroesophageal reflux disease)   . Hypertension   . Insomnia   . Low back pain   . Obesity   . Occasional tremors   . Palpitations    dx with tachycardia   . PONV (postoperative nausea and vomiting)   . Tiredness     Family History  Problem Relation Age of Onset  . Diabetes Mother   . Hypertension Mother   . Hyperlipidemia Mother   . Sarcoidosis Mother   . Kidney disease Father   . Atrial fibrillation Sister   . Kidney disease Maternal Grandmother    . Pancreatic cancer Maternal Grandmother   . Diabetes Maternal Grandmother   . Hypertension Maternal Grandmother   . Cancer Maternal Grandfather   . Colon cancer Neg Hx   . Colon polyps Neg Hx   . Esophageal cancer Neg Hx   . Stomach cancer Neg Hx   . Rectal cancer Neg Hx     Social History   Socioeconomic History  . Marital status: Widowed    Spouse name: Not on file  . Number of children: Not on file  . Years of education: Not on file  . Highest education level: Not on file  Occupational History  . Not on file  Social Needs  . Financial resource strain: Not on file  . Food insecurity:    Worry: Not on file    Inability: Not on file  . Transportation needs:    Medical: Not on file    Non-medical: Not on file  Tobacco Use  . Smoking status: Never Smoker  . Smokeless tobacco: Never Used  Substance and Sexual Activity  . Alcohol use: No  . Drug use: No  . Sexual activity: Never    Birth control/protection: Condom  Lifestyle  . Physical activity:    Days per week: Not on file    Minutes per session: Not on file  . Stress: Not on file  Relationships  .  Social connections:    Talks on phone: Not on file    Gets together: Not on file    Attends religious service: Not on file    Active member of club or organization: Not on file    Attends meetings of clubs or organizations: Not on file    Relationship status: Not on file  . Intimate partner violence:    Fear of current or ex partner: Not on file    Emotionally abused: Not on file    Physically abused: Not on file    Forced sexual activity: Not on file  Other Topics Concern  . Not on file  Social History Narrative  . Not on file    Past Surgical History:  Procedure Laterality Date  . CESAREAN SECTION    . TOOTH EXTRACTION    . TOTAL LAPAROSCOPIC HYSTERECTOMY WITH SALPINGECTOMY Bilateral 10/06/2017   Procedure: ATTEMPTED HYSTERECTOMY TOTAL LAPAROSCOPIC WITH SALPINGECTOMY CONVERTED TO OPEN ABDOMINAL TOTAL  HYSTERECTOMY WITH SALPINGECTOMY;  Surgeon: Sherlyn Hay, DO;  Location: [facility];  Service: Gynecology;  Laterality: Bilateral;    Immunization History  Administered Date(s) Administered  . Influenza,inj,Quad PF,6+ Mos 10/09/2017  . Influenza-Unspecified 10/09/2017, 09/13/2018   Current Meds  Medication Sig  . buPROPion (WELLBUTRIN XL) 300 MG 24 hr tablet Take 1 tablet (300 mg total) by mouth daily.  . carvedilol (COREG) 12.5 MG tablet Take 1 & 1/2 tablets (18.75 mg total) by mouth twice daily  . cetirizine (ZYRTEC) 10 MG tablet Take 1 tablet (10 mg total) by mouth at bedtime as needed for allergies.  . Fluocinolone Acetonide Body 0.01 % OIL Place 2 drops into both ears 2 (two) times daily as needed (for ear pain).   . fluticasone (FLONASE) 50 MCG/ACT nasal spray Place 2 sprays into both nostrils daily.  . furosemide (LASIX) 20 MG tablet Take 20 mg by mouth 2 (two) times daily.   Marland Kitchen gabapentin (NEURONTIN) 300 MG capsule Take 300 mg by mouth 3 (three) times daily.   . Glucose Blood (BLOOD GLUCOSE TEST STRIPS) STRP 2 (two) times a week.  Marland Kitchen HYDROcodone-acetaminophen (NORCO) 10-325 MG tablet Take 1 tablet by mouth 4 (four) times daily.   Marland Kitchen losartan (COZAAR) 100 MG tablet Take 100 mg by mouth daily.  . methocarbamol (ROBAXIN) 750 MG tablet Take 750 mg by mouth 3 (three) times daily.  . primidone (MYSOLINE) 50 MG tablet TAKE 6 TABLETS(300 MG) BY MOUTH AT BEDTIME  . sertraline (ZOLOFT) 50 MG tablet Take 1.5 tablets (75 mg total) by mouth daily.  Marland Kitchen spironolactone (ALDACTONE) 50 MG tablet Take 50 mg by mouth 2 (two) times daily.  Marland Kitchen topiramate (TOPAMAX) 25 MG tablet Take 1 tablet (25 mg total) by mouth at bedtime. Take with dinner  . Vitamin D, Ergocalciferol, (DRISDOL) 50000 units CAPS capsule Take 1 capsule (50,000 Units total) by mouth every 7 (seven) days.  Marland Kitchen zolpidem (AMBIEN) 10 MG tablet TAKE 1 TABLET BY MOUTH EVERY NIGHT AT BEDTIME AS NEEDED FOR INSOMNIA  .  [DISCONTINUED] cetirizine (ZYRTEC) 10 MG tablet Take 1 tablet (10 mg total) by mouth at bedtime as needed for allergies.  . [DISCONTINUED] fluticasone (FLONASE) 50 MCG/ACT nasal spray Place 2 sprays into both nostrils daily.  . [DISCONTINUED] Vitamin D, Ergocalciferol, (DRISDOL) 50000 units CAPS capsule Take 1 capsule (50,000 Units total) by mouth every 7 (seven) days.    Allergies  Allergen Reactions  . Ace Inhibitors Anaphylaxis and Swelling    Swelling of tongue.  BP (!) 142/70 (BP Location: Right Arm, Patient Position: Sitting, Cuff Size: Large)   Pulse 80   Temp 98.3 F (36.8 C) (Oral)   Ht 5\' 4"  (1.626 m)   Wt 247 lb (112 kg)   LMP 09/17/2017 (Approximate)   SpO2 100%   BMI 42.40 kg/m      Review of Systems  Constitutional: Negative.   HENT: Negative.   Respiratory: Negative.   Cardiovascular: Negative.   Gastrointestinal: Negative.   Endocrine: Negative.   Genitourinary: Negative.   Musculoskeletal: Negative.   Skin: Negative.   Neurological: Positive for headaches (Occasional).  Psychiatric/Behavioral: Negative.    Objective:   Physical Exam  Constitutional: She is oriented to person, place, and time. She appears well-developed and well-nourished.  HENT:  Head: Normocephalic and atraumatic.  Eyes: Pupils are equal, round, and reactive to light. EOM are normal.  Neck: Normal range of motion. Neck supple.  Cardiovascular: Normal rate, regular rhythm, normal heart sounds and intact distal pulses.  Pulmonary/Chest: Effort normal and breath sounds normal.  Abdominal: Soft. Bowel sounds are normal.  Musculoskeletal: Normal range of motion.  Neurological: She is alert and oriented to person, place, and time.  Skin: Skin is warm and dry.  Psychiatric: She has a normal mood and affect. Her behavior is normal. Judgment and thought content normal.  Nursing note and vitals reviewed.  Assessment & Plan:   1. Hypertension, unspecified type Antihypertensive  medications are effective. Blood pressure is 142/70 today. Continue Carvedilol and Losartan as directed. She will continue to decrease high sodium intake, excessive alcohol intake, increase potassium intake, smoking cessation, and increase physical activity of at least 30 minutes of cardio activity daily. She will continue to follow Heart Healthy or DASH diet.  2. Weight loss of more than 10% body weight She preparing for Gastric Sleeve surgery on 10/2018. She has had over a 20 lb weight loss in 3 months.   3. Seasonal allergies Refill: - fluticasone (FLONASE) 50 MCG/ACT nasal spray; Place 2 sprays into both nostrils daily.  Dispense: 16 g; Refill: 6 - cetirizine (ZYRTEC) 10 MG tablet; Take 1 tablet (10 mg total) by mouth at bedtime as needed for allergies.  Dispense: 30 tablet; Refill: 6  4. Vitamin D deficiency Refill: - Vitamin D, Ergocalciferol, (DRISDOL) 50000 units CAPS capsule; Take 1 capsule (50,000 Units total) by mouth every 7 (seven) days.  Dispense: 5 capsule; Refill: 3  5. Follow up She will follow up in 4 months.    Meds ordered this encounter  Medications  . fluticasone (FLONASE) 50 MCG/ACT nasal spray    Sig: Place 2 sprays into both nostrils daily.    Dispense:  16 g    Refill:  6  . Vitamin D, Ergocalciferol, (DRISDOL) 50000 units CAPS capsule    Sig: Take 1 capsule (50,000 Units total) by mouth every 7 (seven) days.    Dispense:  5 capsule    Refill:  3  . cetirizine (ZYRTEC) 10 MG tablet    Sig: Take 1 tablet (10 mg total) by mouth at bedtime as needed for allergies.    Dispense:  30 tablet    Refill:  [place],  MSN, [facility] [address] [place], [place] [address] [phone]

## 2018-10-10 ENCOUNTER — Ambulatory Visit (HOSPITAL_COMMUNITY): Payer: Self-pay | Admitting: Licensed Clinical Social Worker

## 2018-10-12 DIAGNOSIS — I10 Essential (primary) hypertension: Secondary | ICD-10-CM | POA: Diagnosis not present

## 2018-10-12 DIAGNOSIS — E669 Obesity, unspecified: Secondary | ICD-10-CM | POA: Diagnosis not present

## 2018-10-12 DIAGNOSIS — M545 Low back pain: Secondary | ICD-10-CM | POA: Diagnosis not present

## 2018-10-12 DIAGNOSIS — G8929 Other chronic pain: Secondary | ICD-10-CM | POA: Diagnosis not present

## 2018-10-12 DIAGNOSIS — E8881 Metabolic syndrome: Secondary | ICD-10-CM | POA: Diagnosis not present

## 2018-10-16 ENCOUNTER — Encounter (HOSPITAL_COMMUNITY): Payer: Self-pay

## 2018-10-16 ENCOUNTER — Ambulatory Visit (HOSPITAL_COMMUNITY): Payer: Self-pay | Admitting: Licensed Clinical Social Worker

## 2018-10-16 DIAGNOSIS — Z6838 Body mass index (BMI) 38.0-38.9, adult: Secondary | ICD-10-CM | POA: Diagnosis not present

## 2018-10-16 DIAGNOSIS — E669 Obesity, unspecified: Secondary | ICD-10-CM | POA: Diagnosis not present

## 2018-10-16 DIAGNOSIS — I10 Essential (primary) hypertension: Secondary | ICD-10-CM | POA: Diagnosis not present

## 2018-10-16 DIAGNOSIS — E8881 Metabolic syndrome: Secondary | ICD-10-CM | POA: Diagnosis not present

## 2018-10-17 DIAGNOSIS — M79605 Pain in left leg: Secondary | ICD-10-CM | POA: Diagnosis not present

## 2018-10-17 DIAGNOSIS — M545 Low back pain: Secondary | ICD-10-CM | POA: Diagnosis not present

## 2018-10-17 DIAGNOSIS — G894 Chronic pain syndrome: Secondary | ICD-10-CM | POA: Diagnosis not present

## 2018-10-17 DIAGNOSIS — M79604 Pain in right leg: Secondary | ICD-10-CM | POA: Diagnosis not present

## 2018-10-17 DIAGNOSIS — Z79891 Long term (current) use of opiate analgesic: Secondary | ICD-10-CM | POA: Diagnosis not present

## 2018-10-19 ENCOUNTER — Ambulatory Visit (HOSPITAL_COMMUNITY): Payer: Medicaid Other | Admitting: Licensed Clinical Social Worker

## 2018-10-27 IMAGING — CT CT ABD-PELV W/ CM
2 of 5 series · 17 of 46 positions shown, 19 images · IV contrast (ISOVUE)
Comparison: None.

CLINICAL DATA: Lower abdominal pain with diarrhea for 10 days.
Hysterectomy 10/06/2017.

EXAM:
CT ABDOMEN AND PELVIS WITH CONTRAST
TECHNIQUE: Multidetector CT imaging of the abdomen and pelvis was performed
using the standard protocol following bolus administration of
intravenous contrast.
CONTRAST:  100mL [id number] IOPAMIDOL (BN6EWE-ZFF) INJECTION 61%

[Series 2: abd/pel with · axial · 0.77mm/px · z∈[+1114,+1524]mm · 14 of 96 slices shown, 16 images]
[im 7/96  soft-tissue]
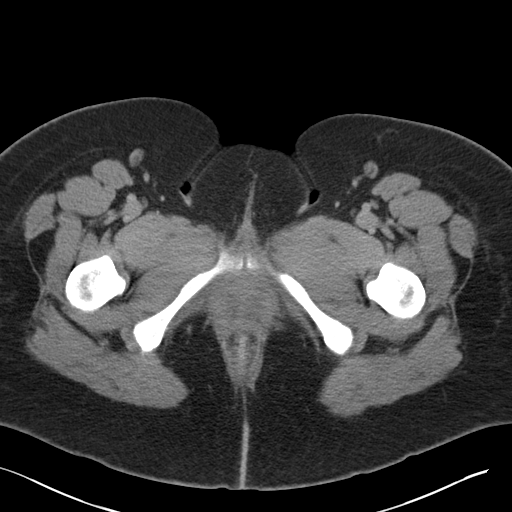
[im 7/96  bone]
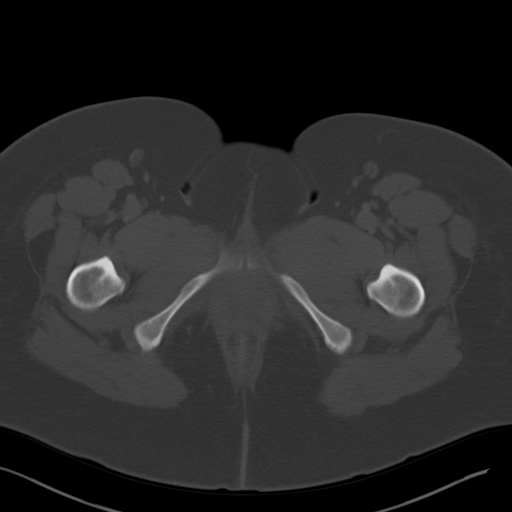
[im 13/96  soft-tissue]
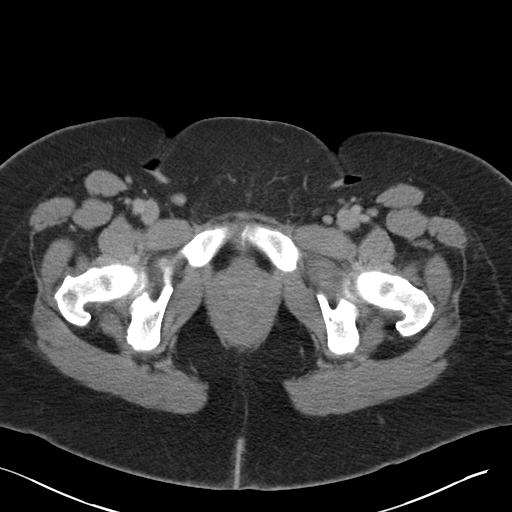
[im 20/96  soft-tissue]
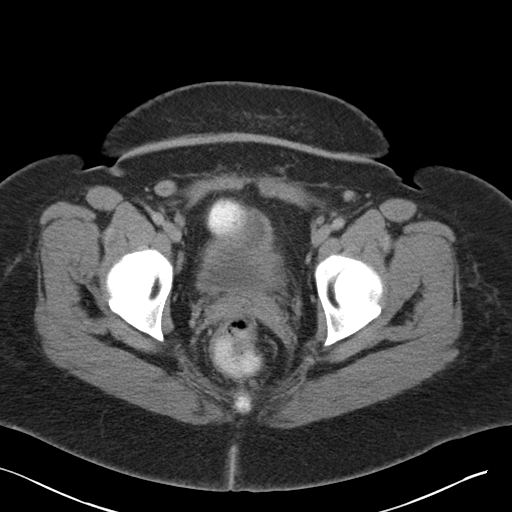
[im 26/96  soft-tissue]
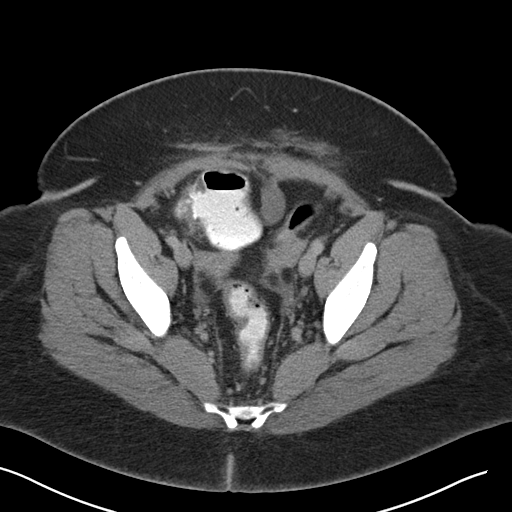
[im 32/96  soft-tissue]
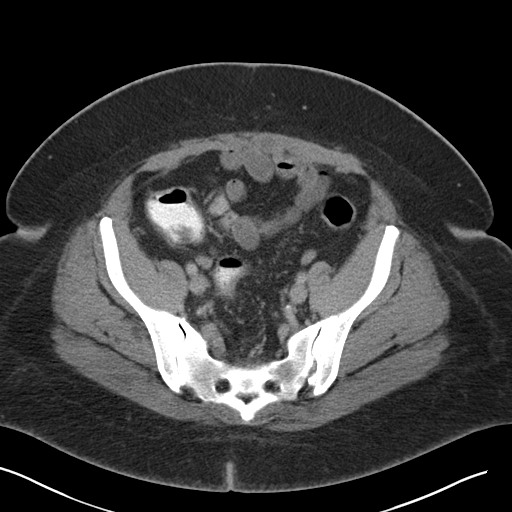
[im 39/96  soft-tissue]
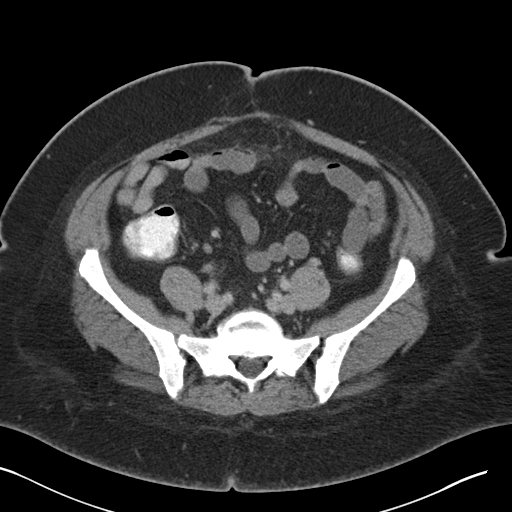
[im 45/96  soft-tissue]
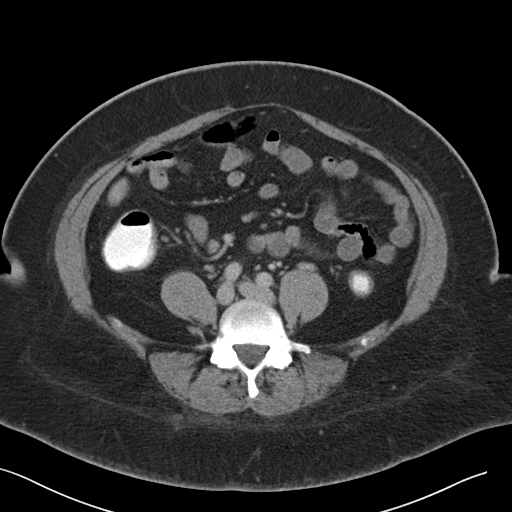
[im 51/96  soft-tissue]
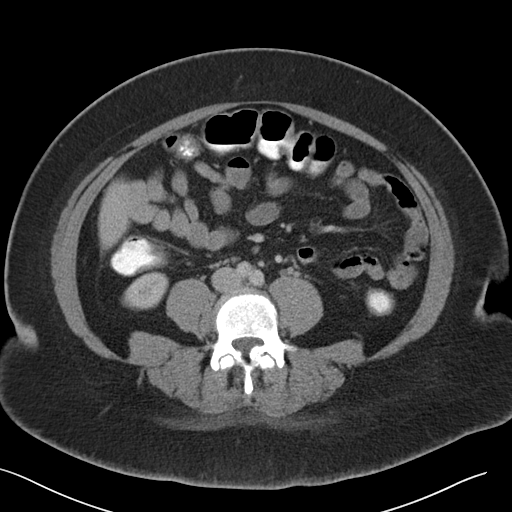
[im 58/96  soft-tissue]
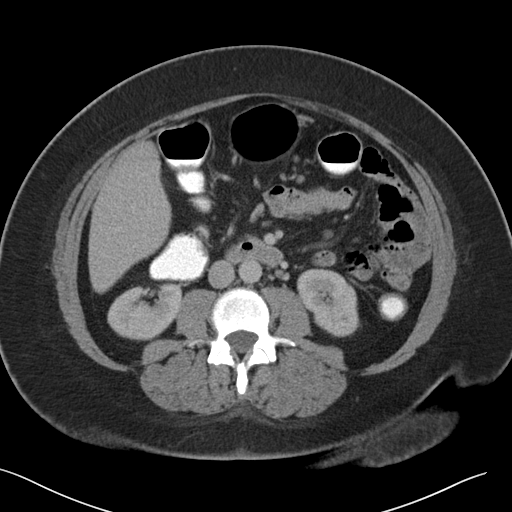
[im 58/96  bone]
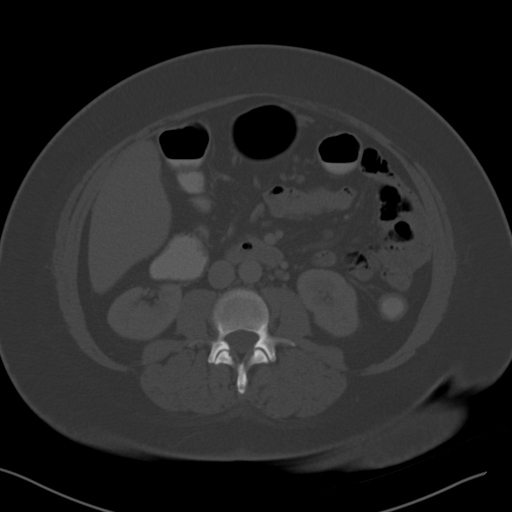
[im 64/96  soft-tissue]
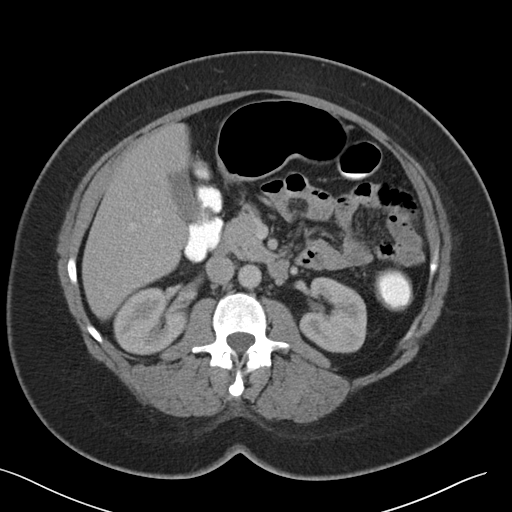
[im 70/96  soft-tissue]
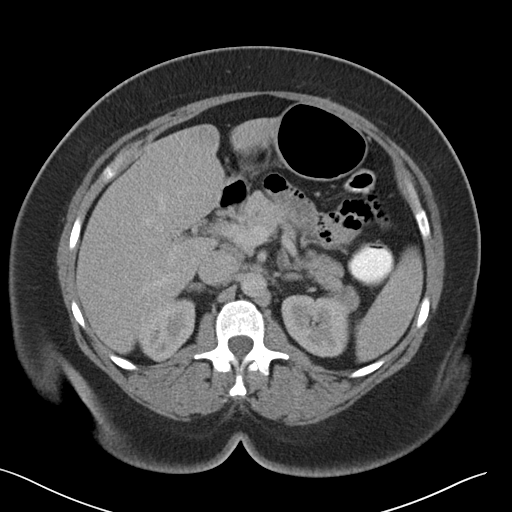
[im 77/96  soft-tissue]
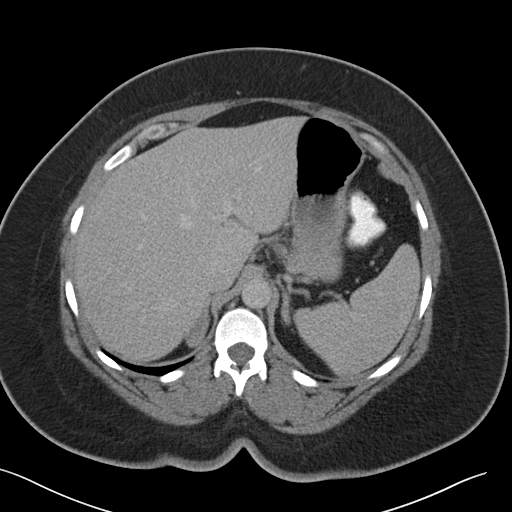
[im 83/96  soft-tissue]
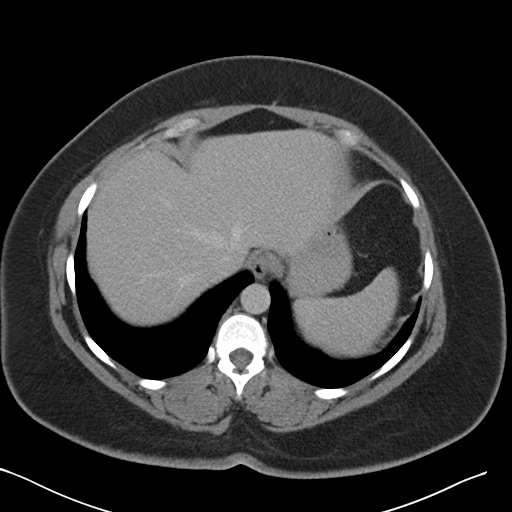
[im 89/96  soft-tissue]
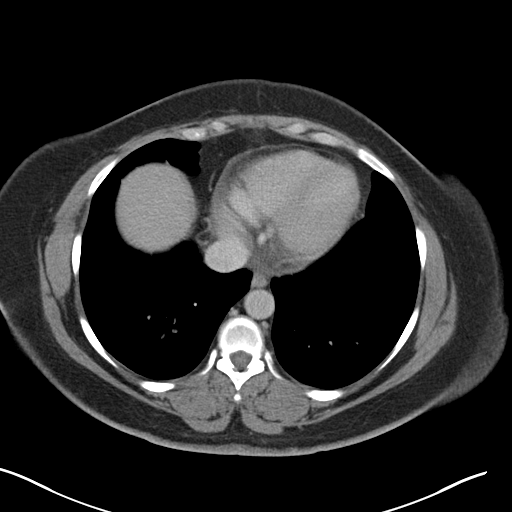

[Series 4: coronal a/|p · coronal · 0.88mm/px · 3 of 190 slices shown]
[im 64/190  soft-tissue]
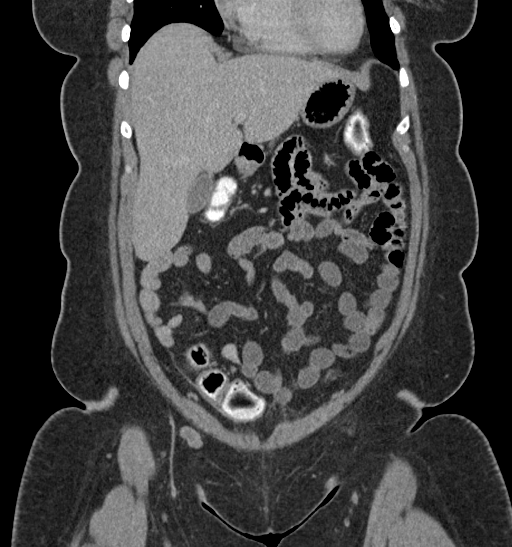
[im 85/190  soft-tissue]
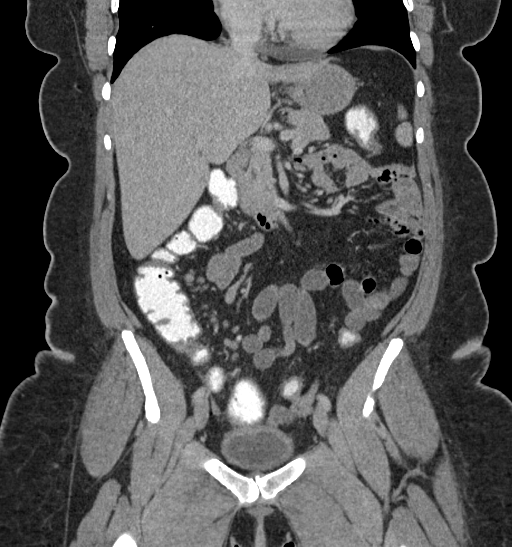
[im 106/190  soft-tissue]
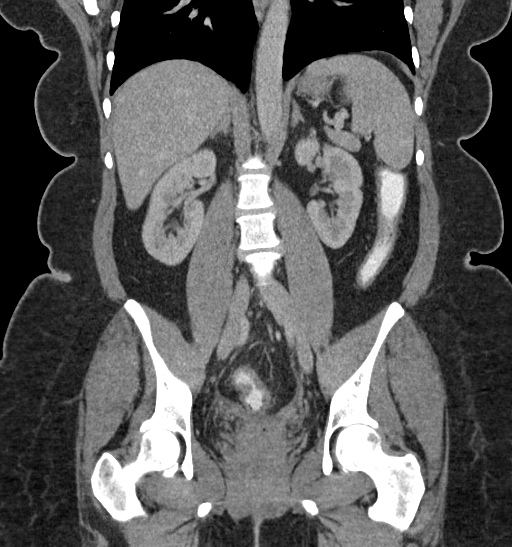

[17 of 46 positions shown; findings below may reference images not displayed]

FINDINGS: Lower chest:  No contributory findings.

Hepatobiliary: No focal liver abnormality.No evidence of biliary
obstruction or stone.

Pancreas: Unremarkable.

Spleen: Unremarkable.

Adrenals/Urinary Tract: 2 right adrenal nodules best visualized on
coronal reformats, the larger measuring up to 14 mm. No
hydronephrosis or stone. Unremarkable bladder.

Stomach/Bowel: History of diarrhea. Oral contrast reaches the
rectum. No definite inflammatory bowel wall thickening or fat
inflammation. No appendicitis.

Vascular/Lymphatic: No acute vascular abnormality. No mass or
adenopathy.

Reproductive:Hysterectomy with mild scarring in the operative
region. Mild prominence of ovarian size that is symmetric and
nonspecific.

Other: No ascites or pneumoperitoneum.

Musculoskeletal: No acute abnormalities. Facet arthropathy that is
advanced at L3-4 where there is grade 1 anterolisthesis. L5-S1
degenerative disc narrowing.
IMPRESSION: 1. No acute finding to explain abdominal pain.
2. 2 right adrenal nodules measuring up to 14 mm. These are
nonspecific on this enhanced scan, statistically likely to reflect
adenomas in the absence of malignancy history.

## 2018-10-30 ENCOUNTER — Ambulatory Visit (HOSPITAL_COMMUNITY): Payer: Self-pay | Admitting: Licensed Clinical Social Worker

## 2018-11-13 DIAGNOSIS — I10 Essential (primary) hypertension: Secondary | ICD-10-CM | POA: Diagnosis not present

## 2018-11-13 DIAGNOSIS — E669 Obesity, unspecified: Secondary | ICD-10-CM | POA: Diagnosis not present

## 2018-11-13 DIAGNOSIS — E8881 Metabolic syndrome: Secondary | ICD-10-CM | POA: Diagnosis not present

## 2018-11-13 DIAGNOSIS — Z6838 Body mass index (BMI) 38.0-38.9, adult: Secondary | ICD-10-CM | POA: Diagnosis not present

## 2018-11-20 ENCOUNTER — Ambulatory Visit: Payer: Self-pay | Admitting: Neurology

## 2018-11-20 DIAGNOSIS — M545 Low back pain: Secondary | ICD-10-CM | POA: Diagnosis not present

## 2018-11-20 DIAGNOSIS — M79605 Pain in left leg: Secondary | ICD-10-CM | POA: Diagnosis not present

## 2018-11-20 DIAGNOSIS — G894 Chronic pain syndrome: Secondary | ICD-10-CM | POA: Diagnosis not present

## 2018-11-20 DIAGNOSIS — Z79891 Long term (current) use of opiate analgesic: Secondary | ICD-10-CM | POA: Diagnosis not present

## 2018-11-20 DIAGNOSIS — M79604 Pain in right leg: Secondary | ICD-10-CM | POA: Diagnosis not present

## 2018-11-20 NOTE — Progress Notes (Deleted)
NEUROLOGY FOLLOW UP OFFICE NOTE  Erin Bradford [id number]  HISTORY OF PRESENT ILLNESS: Erin Bradford is a 47 year old right-handed female with hypertension, iron deficiency anemia, prediabetes, mild chronic kidney disease, and anxiety who follows up for essential tremor.  UPDATE: She currently takes primidone 300mg  at bedtime.  She also takes topiramate 25mg  in the evening for *** as prescribed by ***.    ***  HISTORY: Beginning in 2017, she reports tremor in both hands.  She only notices it when she is performing a task, but not at rest.  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  She also reports difficulty pouring liquids in a spoon or using utensils.  It does not affect writing.  She denies any triggers or anything that improves symptoms.  She thinks it has gotten a little worse over the past year.  She denies starting any new medication prior to onset of symptoms.  She denies family history of tremor.  TSH was 0.92.    She endorses short term memory problems.  She frequently is experiencing word-finding difficulties.  This has been ongoing for about a year but has gotten worse over the past 3 months.  Other than the primidone, she has not had any changes or new medications.  She has not had trouble with finances or disorientation on familiar routes.  To evaluate memory deficits, B12 and TSH were checked, which were 663 and 1.05 respectively.  She has since started seeing a therapist and is doing much better.  It was determined that her memory problems were related to depression.  PAST MEDICAL HISTORY: Past Medical History:  Diagnosis Date  . Abnormal laboratory test   . Allergy   . Anemia   . Anxiety   . Arthritis   . Blood transfusion without reported diagnosis   . BMI 39.0-39.9,adult   . Chronic hypokalemia   . Chronic kidney disease    mild  . Depression   . GERD (gastroesophageal reflux disease)   . Hypertension   . Insomnia   . Low back  pain   . Obesity   . Occasional tremors   . Palpitations    dx with tachycardia   . PONV (postoperative nausea and vomiting)   . Tiredness     MEDICATIONS: Current Outpatient Medications on File Prior to Visit  Medication Sig Dispense Refill  . ACCU-CHEK AVIVA PLUS test strip USE TO CHECK BLOOD SUGAR TWICE WEEKLY  11  . buPROPion (WELLBUTRIN XL) 300 MG 24 hr tablet Take 1 tablet (300 mg total) by mouth daily. 90 tablet 0  . carvedilol (COREG) 12.5 MG tablet Take 1 & 1/2 tablets (18.75 mg total) by mouth twice daily 270 tablet 3  . cetirizine (ZYRTEC) 10 MG tablet Take 1 tablet (10 mg total) by mouth at bedtime as needed for allergies. 30 tablet 6  . Fluocinolone Acetonide Body 0.01 % OIL Place 2 drops into both ears 2 (two) times daily as needed (for ear pain).     . fluticasone (FLONASE) 50 MCG/ACT nasal spray Place 2 sprays into both nostrils daily. 16 g 6  . furosemide (LASIX) 20 MG tablet Take 20 mg by mouth 2 (two) times daily.     Marland Kitchen gabapentin (NEURONTIN) 300 MG capsule Take 300 mg by mouth 3 (three) times daily.     . Glucose Blood (BLOOD GLUCOSE TEST STRIPS) STRP 2 (two) times a week.    Marland Kitchen HYDROcodone-acetaminophen (NORCO) 10-325 MG tablet Take 1 tablet by  mouth 4 (four) times daily.     Marland Kitchen losartan (COZAAR) 100 MG tablet Take 100 mg by mouth daily.    . methocarbamol (ROBAXIN) 750 MG tablet Take 750 mg by mouth 3 (three) times daily.  5  . primidone (MYSOLINE) 50 MG tablet TAKE 6 TABLETS(300 MG) BY MOUTH AT BEDTIME 180 tablet 6  . sertraline (ZOLOFT) 50 MG tablet Take 1.5 tablets (75 mg total) by mouth daily. 135 tablet 0  . spironolactone (ALDACTONE) 50 MG tablet Take 50 mg by mouth 2 (two) times daily.    Marland Kitchen topiramate (TOPAMAX) 25 MG tablet Take 1 tablet (25 mg total) by mouth at bedtime. Take with dinner 90 tablet 0  . Vitamin D, Ergocalciferol, (DRISDOL) 50000 units CAPS capsule Take 1 capsule (50,000 Units total) by mouth every 7 (seven) days. 5 capsule 3  . zolpidem  (AMBIEN) 10 MG tablet TAKE 1 TABLET BY MOUTH EVERY NIGHT AT BEDTIME AS NEEDED FOR INSOMNIA 30 tablet 2   No current facility-administered medications on file prior to visit.     ALLERGIES: Allergies  Allergen Reactions  . Ace Inhibitors Anaphylaxis and Swelling    Swelling of tongue.    FAMILY HISTORY: Family History  Problem Relation Age of Onset  . Diabetes Mother   . Hypertension Mother   . Hyperlipidemia Mother   . Sarcoidosis Mother   . Kidney disease Father   . Atrial fibrillation Sister   . Kidney disease Maternal Grandmother   . Pancreatic cancer Maternal Grandmother   . Diabetes Maternal Grandmother   . Hypertension Maternal Grandmother   . Cancer Maternal Grandfather   . Colon cancer Neg Hx   . Colon polyps Neg Hx   . Esophageal cancer Neg Hx   . Stomach cancer Neg Hx   . Rectal cancer Neg Hx    ***.  SOCIAL HISTORY: Social History   Socioeconomic History  . Marital status: Widowed    Spouse name: Not on file  . Number of children: Not on file  . Years of education: Not on file  . Highest education level: Not on file  Occupational History  . Not on file  Social Needs  . Financial resource strain: Not on file  . Food insecurity:    Worry: Not on file    Inability: Not on file  . Transportation needs:    Medical: Not on file    Non-medical: Not on file  Tobacco Use  . Smoking status: Never Smoker  . Smokeless tobacco: Never Used  Substance and Sexual Activity  . Alcohol use: No  . Drug use: No  . Sexual activity: Never    Birth control/protection: Condom  Lifestyle  . Physical activity:    Days per week: Not on file    Minutes per session: Not on file  . Stress: Not on file  Relationships  . Social connections:    Talks on phone: Not on file    Gets together: Not on file    Attends religious service: Not on file    Active member of club or organization: Not on file    Attends meetings of clubs or organizations: Not on file     Relationship status: Not on file  . Intimate partner violence:    Fear of current or ex partner: Not on file    Emotionally abused: Not on file    Physically abused: Not on file    Forced sexual activity: Not on file  Other Topics Concern  .  Not on file  Social History Narrative  . Not on file    REVIEW OF SYSTEMS: Constitutional: No fevers, chills, or sweats, no generalized fatigue, change in appetite Eyes: No visual changes, double vision, eye pain Ear, nose and throat: No hearing loss, ear pain, nasal congestion, sore throat Cardiovascular: No chest pain, palpitations Respiratory:  No shortness of breath at rest or with exertion, wheezes GastrointestinaI: No nausea, vomiting, diarrhea, abdominal pain, fecal incontinence Genitourinary:  No dysuria, urinary retention or frequency Musculoskeletal:  No neck pain, back pain Integumentary: No rash, pruritus, skin lesions Neurological: as above Psychiatric: No depression, insomnia, anxiety Endocrine: No palpitations, fatigue, diaphoresis, mood swings, change in appetite, change in weight, increased thirst Hematologic/Lymphatic:  No purpura, petechiae. Allergic/Immunologic: no itchy/runny eyes, nasal congestion, recent allergic reactions, rashes  PHYSICAL EXAM: *** General: No acute distress.  Patient appears ***-groomed.  *** body habitus. Head:  Normocephalic/atraumatic Eyes:  Fundi examined but not visualized Neck: supple, no paraspinal tenderness, full range of motion Heart:  Regular rate and rhythm Lungs:  Clear to auscultation bilaterally Back: No paraspinal tenderness Neurological Exam: alert and oriented to person, place, and time. Attention span and concentration intact, recent and remote memory intact, fund of knowledge intact.  Speech fluent and not dysarthric, language intact.  CN II-XII intact. Bulk and tone normal, muscle strength 5/5 throughout.  Sensation to light touch, temperature and vibration intact.  Deep tendon  reflexes 2+ throughout, toes downgoing.  Finger to nose and heel to shin testing intact.  Gait normal, Romberg negative.  IMPRESSION: ***  PLAN: ***  Metta Clines, DO  CC: ***

## 2018-11-22 ENCOUNTER — Encounter: Payer: Self-pay | Admitting: Neurology

## 2018-11-22 ENCOUNTER — Ambulatory Visit: Payer: Medicaid Other | Admitting: Neurology

## 2018-11-22 VITALS — BP 122/84 | HR 83 | Ht 66.5 in | Wt 246.0 lb

## 2018-11-22 DIAGNOSIS — G250 Essential tremor: Secondary | ICD-10-CM | POA: Diagnosis not present

## 2018-11-22 NOTE — Progress Notes (Signed)
NEUROLOGY FOLLOW UP OFFICE NOTE  ARDYCE HEYER [id number]  HISTORY OF PRESENT ILLNESS: Erin Bradford is a 47 year old right-handed female with hypertension, iron deficiency anemia, prediabetes, mild chronic kidney disease, and anxiety who follows up for essential tremor.  UPDATE: She currently takes primidone 300mg  at bedtime.  She also takes topiramate 25mg  in the evening for anxiety and overeating at night as prescribed by her psychiatrist.    Tremors are controlled.  HISTORY: Beginning in 2017, she reports tremor in both hands.  She only notices it when she is performing a task, but not at rest.  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  She also reports difficulty pouring liquids in a spoon or using utensils.  It does not affect writing.  She denies any triggers or anything that improves symptoms.  She thinks it has gotten a little worse over the past year.  She denies starting any new medication prior to onset of symptoms.  She denies family history of tremor.  TSH was 0.92.    She endorses short term memory problems.  She frequently is experiencing word-finding difficulties.  This has been ongoing for about a year but has gotten worse over the past 3 months.  Other than the primidone, she has not had any changes or new medications.  She has not had trouble with finances or disorientation on familiar routes.  To evaluate memory deficits, B12 and TSH were checked, which were 663 and 1.05 respectively.  She has since started seeing a therapist and is doing much better.  It was determined that her memory problems were related to depression.  PAST MEDICAL HISTORY: Past Medical History:  Diagnosis Date  . Abnormal laboratory test   . Allergy   . Anemia   . Anxiety   . Arthritis   . Blood transfusion without reported diagnosis   . BMI 39.0-39.9,adult   . Chronic hypokalemia   . Chronic kidney disease    mild  . Depression   . GERD (gastroesophageal  reflux disease)   . Hypertension   . Insomnia   . Low back pain   . Obesity   . Occasional tremors   . Palpitations    dx with tachycardia   . PONV (postoperative nausea and vomiting)   . Tiredness     MEDICATIONS: Current Outpatient Medications on File Prior to Visit  Medication Sig Dispense Refill  . ACCU-CHEK AVIVA PLUS test strip USE TO CHECK BLOOD SUGAR TWICE WEEKLY  11  . buPROPion (WELLBUTRIN XL) 300 MG 24 hr tablet Take 1 tablet (300 mg total) by mouth daily. 90 tablet 0  . carvedilol (COREG) 12.5 MG tablet Take 1 & 1/2 tablets (18.75 mg total) by mouth twice daily 270 tablet 3  . cetirizine (ZYRTEC) 10 MG tablet Take 1 tablet (10 mg total) by mouth at bedtime as needed for allergies. 30 tablet 6  . Fluocinolone Acetonide Body 0.01 % OIL Place 2 drops into both ears 2 (two) times daily as needed (for ear pain).     . fluticasone (FLONASE) 50 MCG/ACT nasal spray Place 2 sprays into both nostrils daily. 16 g 6  . furosemide (LASIX) 20 MG tablet Take 20 mg by mouth 2 (two) times daily.     Marland Kitchen gabapentin (NEURONTIN) 300 MG capsule Take 300 mg by mouth 3 (three) times daily.     . Glucose Blood (BLOOD GLUCOSE TEST STRIPS) STRP 2 (two) times a week.    Marland Kitchen HYDROcodone-acetaminophen ([place])  10-325 MG tablet Take 1 tablet by mouth 4 (four) times daily.     Marland Kitchen losartan (COZAAR) 100 MG tablet Take 100 mg by mouth daily.    . methocarbamol (ROBAXIN) 750 MG tablet Take 750 mg by mouth 3 (three) times daily.  5  . primidone (MYSOLINE) 50 MG tablet TAKE 6 TABLETS(300 MG) BY MOUTH AT BEDTIME 180 tablet 6  . sertraline (ZOLOFT) 50 MG tablet Take 1.5 tablets (75 mg total) by mouth daily. 135 tablet 0  . spironolactone (ALDACTONE) 50 MG tablet Take 50 mg by mouth 2 (two) times daily.    Marland Kitchen topiramate (TOPAMAX) 25 MG tablet Take 1 tablet (25 mg total) by mouth at bedtime. Take with dinner 90 tablet 0  . Vitamin D, Ergocalciferol, (DRISDOL) 50000 units CAPS capsule Take 1 capsule (50,000 Units total)  by mouth every 7 (seven) days. 5 capsule 3  . zolpidem (AMBIEN) 10 MG tablet TAKE 1 TABLET BY MOUTH EVERY NIGHT AT BEDTIME AS NEEDED FOR INSOMNIA 30 tablet 2   No current facility-administered medications on file prior to visit.     ALLERGIES: Allergies  Allergen Reactions  . Ace Inhibitors Anaphylaxis and Swelling    Swelling of tongue.    FAMILY HISTORY: Family History  Problem Relation Age of Onset  . Diabetes Mother   . Hypertension Mother   . Hyperlipidemia Mother   . Sarcoidosis Mother   . Kidney disease Father   . Atrial fibrillation Sister   . Kidney disease Maternal Grandmother   . Pancreatic cancer Maternal Grandmother   . Diabetes Maternal Grandmother   . Hypertension Maternal Grandmother   . Cancer Maternal Grandfather   . Colon cancer Neg Hx   . Colon polyps Neg Hx   . Esophageal cancer Neg Hx   . Stomach cancer Neg Hx   . Rectal cancer Neg Hx    SOCIAL HISTORY: Social History   Socioeconomic History  . Marital status: Widowed    Spouse name: Not on file  . Number of children: Not on file  . Years of education: Not on file  . Highest education level: Not on file  Occupational History  . Not on file  Social Needs  . Financial resource strain: Not on file  . Food insecurity:    Worry: Not on file    Inability: Not on file  . Transportation needs:    Medical: Not on file    Non-medical: Not on file  Tobacco Use  . Smoking status: Never Smoker  . Smokeless tobacco: Never Used  Substance and Sexual Activity  . Alcohol use: No  . Drug use: No  . Sexual activity: Never    Birth control/protection: Condom  Lifestyle  . Physical activity:    Days per week: Not on file    Minutes per session: Not on file  . Stress: Not on file  Relationships  . Social connections:    Talks on phone: Not on file    Gets together: Not on file    Attends religious service: Not on file    Active member of club or organization: Not on file    Attends meetings of  clubs or organizations: Not on file    Relationship status: Not on file  . Intimate partner violence:    Fear of current or ex partner: Not on file    Emotionally abused: Not on file    Physically abused: Not on file    Forced sexual activity: Not on file  Other Topics Concern  . Not on file  Social History Narrative  . Not on file    REVIEW OF SYSTEMS: Constitutional: No fevers, chills, or sweats, no generalized fatigue, change in appetite Eyes: No visual changes, double vision, eye pain Ear, nose and throat: No hearing loss, ear pain, nasal congestion, sore throat Cardiovascular: No chest pain, palpitations Respiratory:  No shortness of breath at rest or with exertion, wheezes GastrointestinaI: No nausea, vomiting, diarrhea, abdominal pain, fecal incontinence Genitourinary:  No dysuria, urinary retention or frequency Musculoskeletal:  No neck pain, back pain Integumentary: No rash, pruritus, skin lesions Neurological: as above Psychiatric: No depression, insomnia, anxiety Endocrine: No palpitations, fatigue, diaphoresis, mood swings, change in appetite, change in weight, increased thirst Hematologic/Lymphatic:  No purpura, petechiae. Allergic/Immunologic: no itchy/runny eyes, nasal congestion, recent allergic reactions, rashes  PHYSICAL EXAM: Blood pressure 122/84, pulse 83, height 5' 6.5" (1.689 m), weight 246 lb (111.6 kg), last menstrual period 09/17/2017, SpO2 90 %. General: No acute distress.  Patient appears well-groomed.   Head:  Normocephalic/atraumatic Eyes:  Fundi examined but not visualized Neck: supple, no paraspinal tenderness, full range of motion Heart:  Regular rate and rhythm Lungs:  Clear to auscultation bilaterally Back: No paraspinal tenderness Neurological Exam: alert and oriented to person, place, and time. Attention span and concentration intact, recent and remote memory intact, fund of knowledge intact.  Speech fluent and not dysarthric, language  intact.  CN II-XII intact. Bulk and tone normal, muscle strength 5/5 throughout.  Sensation to light touch intact.  Deep tendon reflexes 2+ throughout, toes downgoing.  Finger to nose and heel to shin testing intact.  Gait normal, Romberg negative.  IMPRESSION: Essential tremor, stable  PLAN: 1.  Continue primidone 300mg  at bedtime.  She is now taking topiramate 25mg  daily to suppress appetite which happens to treat tremor as well. 2.  Follow up in 6 months.  17 minutes spent face to face with patient, over 50% spent discussing management.  Metta Clines, DO  CC: [name], FNP

## 2018-11-22 NOTE — Patient Instructions (Signed)
1.  Continue primidone 300mg  at bedtime 2.  Follow up in 6 months.

## 2018-12-06 ENCOUNTER — Ambulatory Visit (HOSPITAL_COMMUNITY): Payer: Medicaid Other | Admitting: Licensed Clinical Social Worker

## 2018-12-12 ENCOUNTER — Other Ambulatory Visit (HOSPITAL_COMMUNITY): Payer: Self-pay | Admitting: Psychiatry

## 2018-12-12 DIAGNOSIS — D500 Iron deficiency anemia secondary to blood loss (chronic): Secondary | ICD-10-CM

## 2018-12-14 ENCOUNTER — Encounter (HOSPITAL_COMMUNITY): Payer: Self-pay | Admitting: Psychiatry

## 2018-12-14 ENCOUNTER — Ambulatory Visit (INDEPENDENT_AMBULATORY_CARE_PROVIDER_SITE_OTHER): Payer: Medicaid Other | Admitting: Psychiatry

## 2018-12-14 DIAGNOSIS — F330 Major depressive disorder, recurrent, mild: Secondary | ICD-10-CM

## 2018-12-14 DIAGNOSIS — F5101 Primary insomnia: Secondary | ICD-10-CM

## 2018-12-14 MED ORDER — ZOLPIDEM TARTRATE 10 MG PO TABS: ORAL_TABLET | ORAL | 1 refills | Status: DC

## 2018-12-14 MED ORDER — SERTRALINE HCL 50 MG PO TABS: 75.0000 mg | ORAL_TABLET | Freq: Every day | ORAL | 0 refills | Status: DC

## 2018-12-14 MED ORDER — TOPIRAMATE 25 MG PO TABS: 25.0000 mg | ORAL_TABLET | Freq: Every day | ORAL | 0 refills | Status: DC

## 2018-12-14 MED ORDER — BUPROPION HCL ER (XL) 300 MG PO TB24: 300.0000 mg | ORAL_TABLET | Freq: Every day | ORAL | 0 refills | Status: DC

## 2018-12-14 NOTE — Progress Notes (Signed)
[facility] MD/PA/NP OP Progress Note  [id number] 4:25 PM Erin Bradford  MRN:  [id number]  Chief Complaint: I am feeling much better.  Topamax helping my headaches and tremors.  I lost another 9 pounds since the last visit.  HPI: Erin Bradford came for her follow-up appointment.  She is doing much better since we added Topamax.  Her anxiety, headaches and tremors are much improved.  She recently seen neurology however no further changes in the medication since tremors are much better.  She went for initial work-up for gastric sleeve surgery but she had decided not to pursue further because she is anxious about her lifestyle after the surgery.  She is trying to lose weight on her own.  She cut down her sweets.  She noticed much improvement in her energy, motivation and depression.  She still have some time crying spells but they are not as bad.  She is sleeping good.  Some nights she skips Ambien and she still fallen sleep.  She is no longer taking phentermine.  She denies any feeling of hopelessness or worthlessness.  She denies any panic attack, suicidal thoughts or any mania.  She denies drinking or using any illegal substances.  She admitted not able to see Charolotte Eke because she is taking care of her daughter who has depression.  However she is hoping to resume therapy very soon.  Her blood pressure is slightly increased.  However she noticed her blood pressure always increase when she comes with the doctor's appointment.  She denies any headaches, sweating, chest pain.  Visit Diagnosis:    ICD-10-CM   1. Primary insomnia F51.01 topiramate (TOPAMAX) 25 MG tablet    zolpidem (AMBIEN) 10 MG tablet  2. MDD (major depressive disorder), recurrent episode, mild (HCC) F33.0 topiramate (TOPAMAX) 25 MG tablet    buPROPion (WELLBUTRIN XL) 300 MG 24 hr tablet    sertraline (ZOLOFT) 50 MG tablet    Past Psychiatric History: Reviewed. No history of psychiatric inpatient treatment, mania, psychosis or any suicidal  attempt. Tried Prozac which worked but caused weight gain.  Tried Vistaril, trazodone, Thorazine and Lunesta for insomnia with poor outcome.  We tried Lamictal but that caused a rash.She was told to do intensive outpatient program at [facility] but she never went there.  Past Medical History:  Past Medical History:  Diagnosis Date  . Abnormal laboratory test   . Allergy   . Anemia   . Anxiety   . Arthritis   . Blood transfusion without reported diagnosis   . BMI 39.0-39.9,adult   . Chronic hypokalemia   . Chronic kidney disease    mild  . Depression   . GERD (gastroesophageal reflux disease)   . Hypertension   . Insomnia   . Low back pain   . Obesity   . Occasional tremors   . Palpitations    dx with tachycardia   . PONV (postoperative nausea and vomiting)   . Tiredness     Past Surgical History:  Procedure Laterality Date  . CESAREAN SECTION    . TOOTH EXTRACTION    . TOTAL LAPAROSCOPIC HYSTERECTOMY WITH SALPINGECTOMY Bilateral 10/06/2017   Procedure: ATTEMPTED HYSTERECTOMY TOTAL LAPAROSCOPIC WITH SALPINGECTOMY CONVERTED TO OPEN ABDOMINAL TOTAL HYSTERECTOMY WITH SALPINGECTOMY;  Surgeon: Sherlyn Hay, DO;  Location: [facility];  Service: Gynecology;  Laterality: Bilateral;    Family Psychiatric History: Reviewed.  Family History:  Family History  Problem Relation Age of Onset  . Diabetes Mother   .  Hypertension Mother   . Hyperlipidemia Mother   . Sarcoidosis Mother   . Kidney disease Father   . Atrial fibrillation Sister   . Kidney disease Maternal Grandmother   . Pancreatic cancer Maternal Grandmother   . Diabetes Maternal Grandmother   . Hypertension Maternal Grandmother   . Cancer Maternal Grandfather   . Colon cancer Neg Hx   . Colon polyps Neg Hx   . Esophageal cancer Neg Hx   . Stomach cancer Neg Hx   . Rectal cancer Neg Hx     Social History:  Social History   Socioeconomic History  . Marital status: Widowed     Spouse name: Not on file  . Number of children: Not on file  . Years of education: Not on file  . Highest education level: Not on file  Occupational History  . Not on file  Social Needs  . Financial resource strain: Not on file  . Food insecurity:    Worry: Not on file    Inability: Not on file  . Transportation needs:    Medical: Not on file    Non-medical: Not on file  Tobacco Use  . Smoking status: Never Smoker  . Smokeless tobacco: Never Used  Substance and Sexual Activity  . Alcohol use: No  . Drug use: No  . Sexual activity: Never    Birth control/protection: Condom  Lifestyle  . Physical activity:    Days per week: Not on file    Minutes per session: Not on file  . Stress: Not on file  Relationships  . Social connections:    Talks on phone: Not on file    Gets together: Not on file    Attends religious service: Not on file    Active member of club or organization: Not on file    Attends meetings of clubs or organizations: Not on file    Relationship status: Not on file  Other Topics Concern  . Not on file  Social History Narrative  . Not on file    Allergies:  Allergies  Allergen Reactions  . Ace Inhibitors Anaphylaxis and Swelling    Swelling of tongue.    Metabolic Disorder Labs: Lab Results  Component Value Date   HGBA1C 5.8 (A) 07/05/2018   No results found for: PROLACTIN Lab Results  Component Value Date   CHOL 183 07/05/2018   TRIG 156 (H) 07/05/2018   HDL 38 (L) 07/05/2018   CHOLHDL 4.8 (H) 07/05/2018   LDLCALC 114 (H) 07/05/2018   Lab Results  Component Value Date   TSH 1.660 07/05/2018   TSH 1.05 12/16/2017    Therapeutic Level Labs: No results found for: LITHIUM No results found for: VALPROATE No components found for:  CBMZ  Current Medications: Current Outpatient Medications  Medication Sig Dispense Refill  . ACCU-CHEK AVIVA PLUS test strip USE TO CHECK BLOOD SUGAR TWICE WEEKLY  11  . buPROPion (WELLBUTRIN XL) 300 MG  24 hr tablet Take 1 tablet (300 mg total) by mouth daily. 90 tablet 0  . carvedilol (COREG) 12.5 MG tablet Take 1 & 1/2 tablets (18.75 mg total) by mouth twice daily 270 tablet 3  . cetirizine (ZYRTEC) 10 MG tablet Take 1 tablet (10 mg total) by mouth at bedtime as needed for allergies. 30 tablet 6  . Fluocinolone Acetonide Body 0.01 % OIL Place 2 drops into both ears 2 (two) times daily as needed (for ear pain).     . fluticasone (FLONASE) 50 MCG/ACT  nasal spray Place 2 sprays into both nostrils daily. 16 g 6  . furosemide (LASIX) 20 MG tablet Take 20 mg by mouth 2 (two) times daily.     Marland Kitchen gabapentin (NEURONTIN) 300 MG capsule Take 300 mg by mouth 3 (three) times daily.     . Glucose Blood (BLOOD GLUCOSE TEST STRIPS) STRP 2 (two) times a week.    Marland Kitchen HYDROcodone-acetaminophen ([place]) 10-325 MG tablet Take 1 tablet by mouth 4 (four) times daily.     Marland Kitchen losartan (COZAAR) 100 MG tablet Take 100 mg by mouth daily.    . methocarbamol (ROBAXIN) 750 MG tablet Take 750 mg by mouth 3 (three) times daily.  5  . primidone (MYSOLINE) 50 MG tablet TAKE 6 TABLETS(300 MG) BY MOUTH AT BEDTIME 180 tablet 6  . sertraline (ZOLOFT) 50 MG tablet Take 1.5 tablets (75 mg total) by mouth daily. 135 tablet 0  . spironolactone (ALDACTONE) 50 MG tablet Take 50 mg by mouth 2 (two) times daily.    Marland Kitchen topiramate (TOPAMAX) 25 MG tablet Take 1 tablet (25 mg total) by mouth at bedtime. Take with dinner 90 tablet 0  . Vitamin D, Ergocalciferol, (DRISDOL) 50000 units CAPS capsule Take 1 capsule (50,000 Units total) by mouth every 7 (seven) days. 5 capsule 3  . zolpidem (AMBIEN) 10 MG tablet TAKE 1 TABLET BY MOUTH EVERY NIGHT AT BEDTIME AS NEEDED FOR INSOMNIA 30 tablet 2   No current facility-administered medications for this visit.      Musculoskeletal: Strength & Muscle Tone: within normal limits Gait & Station: normal Patient leans: N/A  Psychiatric Specialty Exam: Review of Systems  Constitutional: Positive for weight  loss.  Cardiovascular: Negative for chest pain and palpitations.  Skin: Negative.     Blood pressure (!) 151/87, pulse 72, height 5' 6.5" (1.689 m), weight 245 lb (111.1 kg), last menstrual period 09/17/2017, SpO2 99 %.There is no height or weight on file to calculate BMI.  General Appearance: Casual  Eye Contact:  Good  Speech:  Clear and Coherent  Volume:  Normal  Mood:  Euthymic  Affect:  Appropriate  Thought Process:  Goal Directed  Orientation:  Full (Time, Place, and Person)  Thought Content: Logical   Suicidal Thoughts:  No  Homicidal Thoughts:  No  Memory:  Immediate;   Good Recent;   Good Remote;   Good  Judgement:  Good  Insight:  Good  Psychomotor Activity:  Normal  Concentration:  Concentration: Good and Attention Span: Good  Recall:  Good  Fund of Knowledge: Good  Language: Good  Akathisia:  No  Handed:  Right  AIMS (if indicated): not done  Assets:  Communication Skills Desire for Improvement Financial Resources/Insurance Housing Resilience  ADL's:  Intact  Cognition: WNL  Sleep:  Good   Screenings: PHQ2-9     Office Visit from 07/05/2018 in [facility] from 05/09/2018 in [facility]  PHQ-2 Total Score  4  6  PHQ-9 Total Score  8  14       Assessment and Plan: Major depressive disorder, recurrent.  Primary insomnia.  Patient doing better on her current medication.  She does not want to pursue weight loss procedure.  She lost another 6 to 7 pounds since the last visit.  She is watching her calorie intake.  Her depression is stable.  I talked about her high blood pressure.  Patient has no other associated symptoms and recall her blood pressure is high  when she he goes to doctor's appointment.  I recommended to take her blood pressure at home and if still high then she should contact her primary care physician.  Continue Zoloft 75 mg daily, Wellbutrin XL 300 mg daily and Topamax 25 mg at  bedtime.  I recommended to take Ambien only as needed for insomnia.  Discussed hypnotic tolerance, withdrawal and dependency.  Recommended to resume therapy with Charolotte Eke.  Recommended to call us back if is any question or any concern.  Follow-up in 3 months.   [name], MD 12/14/2018, 2:48 PM

## 2019-01-10 ENCOUNTER — Other Ambulatory Visit (HOSPITAL_COMMUNITY): Payer: Self-pay | Admitting: Psychiatry

## 2019-01-10 DIAGNOSIS — F330 Major depressive disorder, recurrent, mild: Secondary | ICD-10-CM

## 2019-01-10 DIAGNOSIS — F5101 Primary insomnia: Secondary | ICD-10-CM

## 2019-01-14 ENCOUNTER — Other Ambulatory Visit: Payer: Self-pay | Admitting: Neurology

## 2019-01-15 DIAGNOSIS — G894 Chronic pain syndrome: Secondary | ICD-10-CM | POA: Diagnosis not present

## 2019-01-15 DIAGNOSIS — Z79891 Long term (current) use of opiate analgesic: Secondary | ICD-10-CM | POA: Diagnosis not present

## 2019-01-15 DIAGNOSIS — M79605 Pain in left leg: Secondary | ICD-10-CM | POA: Diagnosis not present

## 2019-01-15 DIAGNOSIS — M545 Low back pain: Secondary | ICD-10-CM | POA: Diagnosis not present

## 2019-01-15 DIAGNOSIS — M79604 Pain in right leg: Secondary | ICD-10-CM | POA: Diagnosis not present

## 2019-01-16 DIAGNOSIS — E669 Obesity, unspecified: Secondary | ICD-10-CM | POA: Diagnosis not present

## 2019-01-16 DIAGNOSIS — R7303 Prediabetes: Secondary | ICD-10-CM | POA: Diagnosis not present

## 2019-01-16 DIAGNOSIS — E8881 Metabolic syndrome: Secondary | ICD-10-CM | POA: Diagnosis not present

## 2019-01-16 DIAGNOSIS — M545 Low back pain: Secondary | ICD-10-CM | POA: Diagnosis not present

## 2019-01-16 DIAGNOSIS — G8929 Other chronic pain: Secondary | ICD-10-CM | POA: Diagnosis not present

## 2019-01-26 ENCOUNTER — Other Ambulatory Visit (HOSPITAL_COMMUNITY): Payer: Self-pay | Admitting: Psychiatry

## 2019-01-26 DIAGNOSIS — F330 Major depressive disorder, recurrent, mild: Secondary | ICD-10-CM

## 2019-02-05 ENCOUNTER — Ambulatory Visit: Payer: Self-pay | Admitting: Family Medicine

## 2019-02-06 ENCOUNTER — Telehealth: Payer: Self-pay | Admitting: Oncology

## 2019-02-06 ENCOUNTER — Inpatient Hospital Stay: Payer: Medicaid Other | Attending: Oncology

## 2019-02-06 ENCOUNTER — Inpatient Hospital Stay (HOSPITAL_BASED_OUTPATIENT_CLINIC_OR_DEPARTMENT_OTHER): Payer: Medicaid Other | Admitting: Oncology

## 2019-02-06 VITALS — BP 137/79 | HR 71 | Temp 98.6°F | Resp 17 | Ht 66.5 in | Wt 242.3 lb

## 2019-02-06 DIAGNOSIS — Z79899 Other long term (current) drug therapy: Secondary | ICD-10-CM

## 2019-02-06 DIAGNOSIS — N920 Excessive and frequent menstruation with regular cycle: Secondary | ICD-10-CM | POA: Insufficient documentation

## 2019-02-06 DIAGNOSIS — D500 Iron deficiency anemia secondary to blood loss (chronic): Secondary | ICD-10-CM

## 2019-02-06 DIAGNOSIS — Z90710 Acquired absence of both cervix and uterus: Secondary | ICD-10-CM

## 2019-02-06 LAB — IRON AND TIBC
Iron: 73 ug/dL (ref 41–142)
Saturation Ratios: 27 % (ref 21–57)
TIBC: 268 ug/dL (ref 236–444)
UIBC: 195 ug/dL (ref 120–384)

## 2019-02-06 LAB — CBC WITH DIFFERENTIAL (CANCER CENTER ONLY)
ABS IMMATURE GRANULOCYTES: 0.01 10*3/uL (ref 0.00–0.07)
BASOS PCT: 1 %
Basophils Absolute: 0 10*3/uL (ref 0.0–0.1)
EOS ABS: 0.4 10*3/uL (ref 0.0–0.5)
EOS PCT: 6 %
HCT: 41.8 % (ref 36.0–46.0)
Hemoglobin: 13.6 g/dL (ref 12.0–15.0)
Immature Granulocytes: 0 %
Lymphocytes Relative: 34 %
Lymphs Abs: 2 10*3/uL (ref 0.7–4.0)
MCH: 29.7 pg (ref 26.0–34.0)
MCHC: 32.5 g/dL (ref 30.0–36.0)
MCV: 91.3 fL (ref 80.0–100.0)
MONO ABS: 0.5 10*3/uL (ref 0.1–1.0)
MONOS PCT: 8 %
NEUTROS ABS: 3 10*3/uL (ref 1.7–7.7)
Neutrophils Relative %: 51 %
PLATELETS: 346 10*3/uL (ref 150–400)
RBC: 4.58 MIL/uL (ref 3.87–5.11)
RDW: 12.3 % (ref 11.5–15.5)
WBC: 5.8 10*3/uL (ref 4.0–10.5)
nRBC: 0 % (ref 0.0–0.2)

## 2019-02-06 LAB — FERRITIN: FERRITIN: 273 ng/mL (ref 11–307)

## 2019-02-06 NOTE — Progress Notes (Signed)
Hematology and Oncology Follow Up Visit  Erin Bradford [id number] 11-01-1971 48 y.o. 02/06/2019 12:57 PM Erin Bradford, FNPStroud, Erin Lunch, FNP   Principle Diagnosis: 48 year old woman with anemia diagnosed in February 2018.  She was found to have iron deficiency related to menstrual blood losses.   Prior Therapy:  She status post Feraheme infusion for a total of 1000 mg completed in February 2018. She is status post hysterectomy completed on 10/06/2017.  Current therapy: Repeat IV iron infusion as needed.  Interim History: Erin Bradford is here for repeat evaluation.  Since the last visit, she reports no major changes in her health.  She continues to ambulate with the help of a cane without any recent falls or syncope.  Her performance status and quality of life has not changed.  She denies any hematochezia, melena or epistaxis.  She denies any other bleeding issues.  She denies excessive fatigue, tiredness or ice cravings.  She does report some occasional unsteadiness but improved with her cane.    Patient denied any alteration mental status, neuropathy, confusion or dizziness.  Denies any headaches or lethargy.  Denies any night sweats, weight loss or changes in appetite.  Denied orthopnea, dyspnea on exertion or chest discomfort.  Denies shortness of breath, difficulty breathing hemoptysis or cough.  Denies any abdominal distention, nausea, early satiety or dyspepsia.  Denies any hematuria, frequency, dysuria or nocturia.  Denies any skin irritation, dryness or rash.  Denies any ecchymosis or petechiae.  Denies any lymphadenopathy or clotting.  Denies any heat or cold intolerance.  Denies any anxiety or depression.  Remaining review of system is negative.    Medications: I have reviewed the patient's current medications.  Current Outpatient Medications  Medication Sig Dispense Refill  . ACCU-CHEK AVIVA PLUS test strip USE TO CHECK BLOOD SUGAR TWICE WEEKLY  11  . buPROPion  (WELLBUTRIN XL) 300 MG 24 hr tablet Take 1 tablet (300 mg total) by mouth daily. 90 tablet 0  . carvedilol (COREG) 12.5 MG tablet Take 1 & 1/2 tablets (18.75 mg total) by mouth twice daily 270 tablet 3  . cetirizine (ZYRTEC) 10 MG tablet Take 1 tablet (10 mg total) by mouth at bedtime as needed for allergies. 30 tablet 6  . Fluocinolone Acetonide Body 0.01 % OIL Place 2 drops into both ears 2 (two) times daily as needed (for ear pain).     . fluticasone (FLONASE) 50 MCG/ACT nasal spray Place 2 sprays into both nostrils daily. 16 g 6  . furosemide (LASIX) 20 MG tablet Take 20 mg by mouth 2 (two) times daily.     Marland Kitchen gabapentin (NEURONTIN) 300 MG capsule Take 300 mg by mouth 3 (three) times daily.     . Glucose Blood (BLOOD GLUCOSE TEST STRIPS) STRP 2 (two) times a week.    Marland Kitchen HYDROcodone-acetaminophen (NORCO) 10-325 MG tablet Take 1 tablet by mouth 4 (four) times daily.     Marland Kitchen losartan (COZAAR) 100 MG tablet Take 100 mg by mouth daily.    . methocarbamol (ROBAXIN) 750 MG tablet Take 750 mg by mouth 3 (three) times daily.  5  . primidone (MYSOLINE) 50 MG tablet TAKE 6 TABLETS(300 MG) BY MOUTH AT BEDTIME 180 tablet 6  . sertraline (ZOLOFT) 50 MG tablet Take 1.5 tablets (75 mg total) by mouth daily. 135 tablet 0  . spironolactone (ALDACTONE) 50 MG tablet Take 50 mg by mouth 2 (two) times daily.    Marland Kitchen topiramate (TOPAMAX) 25 MG tablet Take 1 tablet (  25 mg total) by mouth at bedtime. Take with dinner 90 tablet 0  . Vitamin D, Ergocalciferol, (DRISDOL) 50000 units CAPS capsule Take 1 capsule (50,000 Units total) by mouth every 7 (seven) days. 5 capsule 3  . zolpidem (AMBIEN) 10 MG tablet TAKE 1 TABLET BY MOUTH EVERY NIGHT AT BEDTIME AS NEEDED FOR INSOMNIA 30 tablet 1   No current facility-administered medications for this visit.      Allergies:  Allergies  Allergen Reactions  . Ace Inhibitors Anaphylaxis and Swelling    Swelling of tongue.    Past Medical History, Surgical history, Social history,  and Family History reviewed and unchanged.   Physical Exam:  Blood pressure 137/79, pulse 71, temperature 98.6 F (37 C), temperature source Oral, resp. rate 17, height 5' 6.5" (1.689 m), weight 242 lb 4.8 oz (109.9 kg), last menstrual period 09/17/2017, SpO2 98 %.     ECOG: 0    General appearance: Alert, awake without any distress. Head: Atraumatic without abnormalities Oropharynx: Without any thrush or ulcers. Eyes: No scleral icterus. Lymph nodes: No lymphadenopathy noted in the cervical, supraclavicular, or axillary nodes Heart:regular rate and rhythm, without any murmurs or gallops.   Lung: Clear to auscultation without any rhonchi, wheezes or dullness to percussion. Abdomin: Soft, nontender without any shifting dullness or ascites. Musculoskeletal: No clubbing or cyanosis. Neurological: No motor or sensory deficits. Skin: No rashes or lesions.    Lab Results: Lab Results  Component Value Date   WBC 5.8 10/04/2018   HGB 13.1 10/04/2018   HCT 38.7 10/04/2018   MCV 89.2 10/04/2018   PLT 381 10/04/2018     Chemistry      Component Value Date/Time   NA 138 07/19/2018 2238   NA 136 07/05/2018 1401   NA 138 02/04/2017 1438   K 3.8 07/19/2018 2238   K 3.8 02/04/2017 1438   CL 101 07/19/2018 2238   CO2 28 07/19/2018 2238   CO2 26 02/04/2017 1438   BUN 9 07/19/2018 2238   BUN 7 07/05/2018 1401   BUN 8.9 02/04/2017 1438   CREATININE 1.05 (H) 07/19/2018 2238   CREATININE 1.0 02/04/2017 1438      Component Value Date/Time   CALCIUM 9.4 07/19/2018 2238   CALCIUM 9.2 02/04/2017 1438   ALKPHOS 94 07/05/2018 1401   ALKPHOS 90 02/04/2017 1438   AST 19 07/05/2018 1401   AST 17 02/04/2017 1438   ALT 16 07/05/2018 1401   ALT 20 02/04/2017 1438   BILITOT 0.3 07/05/2018 1401   BILITOT <0.22 02/04/2017 1438      Results for Erin Bradford (MRN [id number]) as of 02/06/2019 13:02  Ref. Range 06/16/2018 13:29 10/04/2018 13:06  Iron Latest Ref Range: 41 - 142 ug/dL  60 52  UIBC Latest Units: ug/dL 243 219  TIBC Latest Ref Range: 236 - 444 ug/dL 304 271  Saturation Ratios Latest Ref Range: 21 - 57 % 20 (L) 19 (L)  Ferritin Latest Ref Range: 11 - 307 ng/mL 161 332 (H)      Impression and Plan:  48 year old woman with:  1. Iron deficiency anemia diagnosed in 2018.  Her anemia is related to chronic menstrual blood losses.    She is status post intravenous iron in the past with iron studies in October 2019 showed normal iron and ferritin levels.  Her hemoglobin continues to be within normal range without any need for repeat intravenous iron.  Risks and benefits of continuing this approach and repeat intravenous iron as needed  was discussed today.  For the time being we will continue active surveillance and repeat studies in 6 months.     2. Menorrhagia: She is status post hysterectomy without any evidence of bleeding.  3. Colon cancer screening: I discussed with the risks and benefits of obtaining colonoscopy and she will consider that as she is approaching the age of 20.  61. Follow-up: Will be in 6 months.  15  minutes was spent with the patient face-to-face today.  More than 50% of time was dedicated to discussing her disease status, laboratory data and answering questions regarding future plan of care.    Zola Button, MD 2/11/202012:57 PM

## 2019-02-06 NOTE — Telephone Encounter (Signed)
Scheduled appt per 02/11 los. ° °Printed calendar and avs. °

## 2019-02-14 DIAGNOSIS — G894 Chronic pain syndrome: Secondary | ICD-10-CM | POA: Diagnosis not present

## 2019-02-14 DIAGNOSIS — M545 Low back pain: Secondary | ICD-10-CM | POA: Diagnosis not present

## 2019-02-14 DIAGNOSIS — M79604 Pain in right leg: Secondary | ICD-10-CM | POA: Diagnosis not present

## 2019-02-14 DIAGNOSIS — M79605 Pain in left leg: Secondary | ICD-10-CM | POA: Diagnosis not present

## 2019-02-14 DIAGNOSIS — Z79891 Long term (current) use of opiate analgesic: Secondary | ICD-10-CM | POA: Diagnosis not present

## 2019-03-11 ENCOUNTER — Other Ambulatory Visit: Payer: Self-pay | Admitting: Family Medicine

## 2019-03-11 DIAGNOSIS — E559 Vitamin D deficiency, unspecified: Secondary | ICD-10-CM

## 2019-03-14 DIAGNOSIS — Z79891 Long term (current) use of opiate analgesic: Secondary | ICD-10-CM | POA: Diagnosis not present

## 2019-03-14 DIAGNOSIS — G894 Chronic pain syndrome: Secondary | ICD-10-CM | POA: Diagnosis not present

## 2019-03-14 DIAGNOSIS — M79605 Pain in left leg: Secondary | ICD-10-CM | POA: Diagnosis not present

## 2019-03-14 DIAGNOSIS — M545 Low back pain: Secondary | ICD-10-CM | POA: Diagnosis not present

## 2019-03-14 DIAGNOSIS — M79604 Pain in right leg: Secondary | ICD-10-CM | POA: Diagnosis not present

## 2019-03-20 ENCOUNTER — Ambulatory Visit (INDEPENDENT_AMBULATORY_CARE_PROVIDER_SITE_OTHER): Payer: Medicaid Other | Admitting: Psychiatry

## 2019-03-20 ENCOUNTER — Other Ambulatory Visit: Payer: Self-pay

## 2019-03-20 DIAGNOSIS — F330 Major depressive disorder, recurrent, mild: Secondary | ICD-10-CM | POA: Diagnosis not present

## 2019-03-20 DIAGNOSIS — Z6836 Body mass index (BMI) 36.0-36.9, adult: Secondary | ICD-10-CM | POA: Diagnosis not present

## 2019-03-20 DIAGNOSIS — E669 Obesity, unspecified: Secondary | ICD-10-CM | POA: Diagnosis not present

## 2019-03-20 DIAGNOSIS — Z79899 Other long term (current) drug therapy: Secondary | ICD-10-CM | POA: Diagnosis not present

## 2019-03-20 DIAGNOSIS — E8881 Metabolic syndrome: Secondary | ICD-10-CM | POA: Diagnosis not present

## 2019-03-20 DIAGNOSIS — F5101 Primary insomnia: Secondary | ICD-10-CM | POA: Diagnosis not present

## 2019-03-20 DIAGNOSIS — R7303 Prediabetes: Secondary | ICD-10-CM | POA: Diagnosis not present

## 2019-03-20 MED ORDER — SERTRALINE HCL 50 MG PO TABS: 75.0000 mg | ORAL_TABLET | Freq: Every day | ORAL | 0 refills | Status: DC

## 2019-03-20 MED ORDER — TOPIRAMATE 25 MG PO TABS: 25.0000 mg | ORAL_TABLET | Freq: Every day | ORAL | 0 refills | Status: DC

## 2019-03-20 MED ORDER — DOXEPIN HCL 10 MG PO CAPS: 10.0000 mg | ORAL_CAPSULE | Freq: Every day | ORAL | 1 refills | Status: DC

## 2019-03-20 MED ORDER — BUPROPION HCL ER (XL) 300 MG PO TB24: 300.0000 mg | ORAL_TABLET | Freq: Every day | ORAL | 0 refills | Status: DC

## 2019-03-20 NOTE — Progress Notes (Addendum)
BH MD/PA/NP OP Progress Note  03/10/1760 6:07 PM Erin Bradford  MRN:  [id number]  Patient was seen via webex for virtual visit due to COVID-19. Patient was sent at home and known to this Probation officer.  I discussed the limitations, risks, security and privacy concerns of performing an evaluation and management service by telephone and the availability of in person appointments. I also discussed with the patient that there may be a patient responsible charge related to this service. The patient expressed understanding and agreed to proceed.    Chief Complaint: I cannot sleep.  I tried Ambien but it does not work.    HPI: Erin Bradford was seen through the WebEx virtual visit due to pandemic coronavirus.  Overall she describes her mood is good.  She denies any irritability, anger or any mania.  Her depression is a stable but she continues to struggle with insomnia.  She had tried in the past hydroxyzine, trazodone, Thorazine and Lunesta with poor outcome.  Recently we tried Ambien but that did not work either.  She denies any paranoia or any hallucination.  She denies any panic attack.  She is taking Topamax which is helping her mood and she also pleased that she lost weight.  She told last week she went to her physician and her weight was 235.  Since patient is seeing through WebEx no vitals were taken today.  She has not resume therapy due to pandemic virus but hoping to resume once things will better.  She has no tremors shakes or any EPS.  She does not want to change medication however like to try something else for insomnia.  Visit Diagnosis:    ICD-10-CM   1. Primary insomnia F51.01 topiramate (TOPAMAX) 25 MG tablet    doxepin (SINEQUAN) 10 MG capsule  2. MDD (major depressive disorder), recurrent episode, mild (HCC) F33.0 topiramate (TOPAMAX) 25 MG tablet    sertraline (ZOLOFT) 50 MG tablet    buPROPion (WELLBUTRIN XL) 300 MG 24 hr tablet    Past Psychiatric History: Reviewed H/O depression.  No  h/o inpatient treatment, mania, psychosisor suicidal attempt. Tried Prozac which worked but caused weight gain.  Tried Vistaril, trazodone, Thorazine, Lunesta and Ambien for insomnia with poor outcome.We tried Lamictal but that caused a rash.  Past Medical History:  Past Medical History:  Diagnosis Date  . Abnormal laboratory test   . Allergy   . Anemia   . Anxiety   . Arthritis   . Blood transfusion without reported diagnosis   . BMI 39.0-39.9,adult   . Chronic hypokalemia   . Chronic kidney disease    mild  . Depression   . GERD (gastroesophageal reflux disease)   . Hypertension   . Insomnia   . Low back pain   . Obesity   . Occasional tremors   . Palpitations    dx with tachycardia   . PONV (postoperative nausea and vomiting)   . Tiredness     Past Surgical History:  Procedure Laterality Date  . CESAREAN SECTION    . TOOTH EXTRACTION    . TOTAL LAPAROSCOPIC HYSTERECTOMY WITH SALPINGECTOMY Bilateral 10/06/2017   Procedure: ATTEMPTED HYSTERECTOMY TOTAL LAPAROSCOPIC WITH SALPINGECTOMY CONVERTED TO OPEN ABDOMINAL TOTAL HYSTERECTOMY WITH SALPINGECTOMY;  Surgeon: Sherlyn Hay, DO;  Location: [facility];  Service: Gynecology;  Laterality: Bilateral;    Family Psychiatric History: Reviewed  Family History:  Family History  Problem Relation Age of Onset  . Diabetes Mother   . Hypertension Mother   .  Hyperlipidemia Mother   . Sarcoidosis Mother   . Kidney disease Father   . Atrial fibrillation Sister   . Kidney disease Maternal Grandmother   . Pancreatic cancer Maternal Grandmother   . Diabetes Maternal Grandmother   . Hypertension Maternal Grandmother   . Cancer Maternal Grandfather   . Colon cancer Neg Hx   . Colon polyps Neg Hx   . Esophageal cancer Neg Hx   . Stomach cancer Neg Hx   . Rectal cancer Neg Hx     Social History:  Social History   Socioeconomic History  . Marital status: Widowed    Spouse name: Not on file  .  Number of children: Not on file  . Years of education: Not on file  . Highest education level: Not on file  Occupational History  . Not on file  Social Needs  . Financial resource strain: Not on file  . Food insecurity:    Worry: Not on file    Inability: Not on file  . Transportation needs:    Medical: Not on file    Non-medical: Not on file  Tobacco Use  . Smoking status: Never Smoker  . Smokeless tobacco: Never Used  Substance and Sexual Activity  . Alcohol use: No  . Drug use: No  . Sexual activity: Never    Birth control/protection: Condom  Lifestyle  . Physical activity:    Days per week: Not on file    Minutes per session: Not on file  . Stress: Not on file  Relationships  . Social connections:    Talks on phone: Not on file    Gets together: Not on file    Attends religious service: Not on file    Active member of club or organization: Not on file    Attends meetings of clubs or organizations: Not on file    Relationship status: Not on file  Other Topics Concern  . Not on file  Social History Narrative  . Not on file    Allergies:  Allergies  Allergen Reactions  . Ace Inhibitors Anaphylaxis and Swelling    Swelling of tongue.    Metabolic Disorder Labs: Lab Results  Component Value Date   HGBA1C 5.8 (A) 07/05/2018   No results found for: PROLACTIN Lab Results  Component Value Date   CHOL 183 07/05/2018   TRIG 156 (H) 07/05/2018   HDL 38 (L) 07/05/2018   CHOLHDL 4.8 (H) 07/05/2018   LDLCALC 114 (H) 07/05/2018   Lab Results  Component Value Date   TSH 1.660 07/05/2018   TSH 1.05 12/16/2017    Therapeutic Level Labs: No results found for: LITHIUM No results found for: VALPROATE No components found for:  CBMZ  Current Medications: Current Outpatient Medications  Medication Sig Dispense Refill  . ACCU-CHEK AVIVA PLUS test strip USE TO CHECK BLOOD SUGAR TWICE WEEKLY  11  . buPROPion (WELLBUTRIN XL) 300 MG 24 hr tablet Take 1 tablet (300 mg  total) by mouth daily. 90 tablet 0  . carvedilol (COREG) 12.5 MG tablet Take 1 & 1/2 tablets (18.75 mg total) by mouth twice daily 270 tablet 3  . cetirizine (ZYRTEC) 10 MG tablet Take 1 tablet (10 mg total) by mouth at bedtime as needed for allergies. 30 tablet 6  . Fluocinolone Acetonide Body 0.01 % OIL Place 2 drops into both ears 2 (two) times daily as needed (for ear pain).     . fluticasone (FLONASE) 50 MCG/ACT nasal spray Place 2 sprays  into both nostrils daily. 16 g 6  . furosemide (LASIX) 20 MG tablet Take 20 mg by mouth 2 (two) times daily.     Marland Kitchen gabapentin (NEURONTIN) 300 MG capsule Take 300 mg by mouth 3 (three) times daily.     . Glucose Blood (BLOOD GLUCOSE TEST STRIPS) STRP 2 (two) times a week.    Marland Kitchen HYDROcodone-acetaminophen (NORCO) 10-325 MG tablet Take 1 tablet by mouth 4 (four) times daily.     Marland Kitchen losartan (COZAAR) 100 MG tablet Take 100 mg by mouth daily.    . methocarbamol (ROBAXIN) 750 MG tablet Take 750 mg by mouth 3 (three) times daily.  5  . primidone (MYSOLINE) 50 MG tablet TAKE 6 TABLETS(300 MG) BY MOUTH AT BEDTIME 180 tablet 6  . sertraline (ZOLOFT) 50 MG tablet Take 1.5 tablets (75 mg total) by mouth daily. 135 tablet 0  . spironolactone (ALDACTONE) 50 MG tablet Take 50 mg by mouth 2 (two) times daily.    Marland Kitchen topiramate (TOPAMAX) 25 MG tablet Take 1 tablet (25 mg total) by mouth at bedtime. Take with dinner 90 tablet 0  . Vitamin D, Ergocalciferol, (DRISDOL) 50000 units CAPS capsule Take 1 capsule (50,000 Units total) by mouth every 7 (seven) days. 5 capsule 3  . zolpidem (AMBIEN) 10 MG tablet TAKE 1 TABLET BY MOUTH EVERY NIGHT AT BEDTIME AS NEEDED FOR INSOMNIA 30 tablet 1   No current facility-administered medications for this visit.    Mental status examination; Patient was evaluated through WebEx video conference.  She appears calm and cooperative.  Does not appear to be in distress.  No abnormal movement noted on her face.  Her speech is clear coherent and her  thought process goal-directed and logical.  There were no flight of ideas or any loose association.  There were no delusions, paranoia or any obsessive thoughts.  She describes her mood is tired and her affect is appropriate.  Her attention and concentration is good.  Her fund of knowledge is adequate.  Her psychomotor activity appears to be normal.  Her insight judgment and impulse control is okay.  She is alert and oriented x3.  Screenings: PHQ2-9     Office Visit from 07/05/2018 in [facility] from 05/09/2018 in [facility]  PHQ-2 Total Score  4  6  PHQ-9 Total Score  8  14       Assessment and Plan: Major depressive disorder, recurrent.  Primary insomnia.  I reviewed her records and current medication.  Recommended to discontinue Ambien since it is not working.  We will try low-dose doxepin 10 mg to help insomnia.  She does not want to change her other psychotropic medication since it is working well.  I will continue Topamax 25 mg at bedtime, Zoloft 75 mg daily and Wellbutrin XL 300 mg daily.  She like to resume therapy once Covid 19 gets under control.  Discussed medication side effects and benefits.  She do not recall any tremors, shakes or any EPS.  Recommended to call us back if she has any question or any concern.  Follow-up in 2 to 3 months.   [name], MD 03/20/2019, 2:29 PM

## 2019-03-23 ENCOUNTER — Other Ambulatory Visit: Payer: Self-pay

## 2019-03-23 ENCOUNTER — Telehealth (INDEPENDENT_AMBULATORY_CARE_PROVIDER_SITE_OTHER): Payer: Medicaid Other | Admitting: Cardiology

## 2019-03-23 ENCOUNTER — Telehealth: Payer: Self-pay | Admitting: Cardiology

## 2019-03-23 ENCOUNTER — Encounter: Payer: Self-pay | Admitting: Cardiology

## 2019-03-23 DIAGNOSIS — R000 Tachycardia, unspecified: Secondary | ICD-10-CM

## 2019-03-23 MED ORDER — METOPROLOL SUCCINATE ER 100 MG PO TB24: 100.0000 mg | ORAL_TABLET | Freq: Every day | ORAL | 6 refills | Status: DC

## 2019-03-23 NOTE — Telephone Encounter (Signed)
Pharmacy just wanted to clarify medication to be taken at bedtime.

## 2019-03-23 NOTE — Progress Notes (Signed)
Electrophysiology TeleHealth Note   Due to national recommendations of social distancing due to COVID 19, an audio/video telehealth visit is felt to be most appropriate for this patient at this time.  See MyChart message from today for the patient's consent to telehealth for [facility], [facility], [facility].   Date:  1/54/0086   ID:  Erin Bradford, DOB 1971-03-14, MRN [id number]  Location: patient's home  Provider location: [address] [place]  Evaluation Performed: Follow-up visit  PCP:  Azzie Glatter, FNP  Cardiologist:  [facility] Electrophysiologist:  None   Chief Complaint:  Palpitations, murmur  History of Present Illness:    Erin Bradford is a 48 y.o. female who presents via audio/video conferencing for a telehealth visit today.  Since last being seen in our clinic, the patient reports doing very well.  Today, she denies symptoms of palpitations, chest pain, shortness of breath,  lower extremity edema, dizziness, presyncope, or syncope.  The patient is otherwise without complaint today.  The patient denies symptoms of fevers, chills, cough, or new SOB worrisome for COVID 19.   She has a history of hypertension, obesity, palpitations and hypokalemia.  Her blood pressure is been fairly well controlled and she has not had much in the way of palpitations.  Her main issue today is weakness and fatigue.  Her primary physician has checked her TSH and vitamin D.  Her vitamin D was very low and is currently being supplemented.  Despite that, she has continued to feel weak and fatigued.  She is on 12.5 mg of carvedilol twice a day.  Past Medical History:  Diagnosis Date  . Abnormal laboratory test   . Allergy   . Anemia   . Anxiety   . Arthritis   . Blood transfusion without reported diagnosis   . BMI 39.0-39.9,adult   . Chronic hypokalemia   . Chronic kidney disease    mild  . Depression   . GERD (gastroesophageal reflux disease)   . Hypertension   . Insomnia   .  Low back pain   . Obesity   . Occasional tremors   . Palpitations    dx with tachycardia   . PONV (postoperative nausea and vomiting)   . Tiredness     Past Surgical History:  Procedure Laterality Date  . CESAREAN SECTION    . TOOTH EXTRACTION    . TOTAL LAPAROSCOPIC HYSTERECTOMY WITH SALPINGECTOMY Bilateral 10/06/2017   Procedure: ATTEMPTED HYSTERECTOMY TOTAL LAPAROSCOPIC WITH SALPINGECTOMY CONVERTED TO OPEN ABDOMINAL TOTAL HYSTERECTOMY WITH SALPINGECTOMY;  Surgeon: Sherlyn Hay, DO;  Location: [facility];  Service: Gynecology;  Laterality: Bilateral;    Current Outpatient Medications  Medication Sig Dispense Refill  . ACCU-CHEK AVIVA PLUS test strip USE TO CHECK BLOOD SUGAR TWICE WEEKLY  11  . buPROPion (WELLBUTRIN XL) 300 MG 24 hr tablet Take 1 tablet (300 mg total) by mouth daily. 90 tablet 0  . carvedilol (COREG) 12.5 MG tablet Take 1 & 1/2 tablets (18.75 mg total) by mouth twice daily 270 tablet 3  . cetirizine (ZYRTEC) 10 MG tablet Take 1 tablet (10 mg total) by mouth at bedtime as needed for allergies. 30 tablet 6  . doxepin (SINEQUAN) 10 MG capsule Take 1 capsule (10 mg total) by mouth at bedtime. 30 capsule 1  . Fluocinolone Acetonide Body 0.01 % OIL Place 2 drops into both ears 2 (two) times daily as needed (for ear pain).     . fluticasone (FLONASE) 50  MCG/ACT nasal spray Place 2 sprays into both nostrils daily. 16 g 6  . furosemide (LASIX) 20 MG tablet Take 20 mg by mouth 2 (two) times daily.     Marland Kitchen gabapentin (NEURONTIN) 300 MG capsule Take 300 mg by mouth 3 (three) times daily.     . Glucose Blood (BLOOD GLUCOSE TEST STRIPS) STRP 2 (two) times a week.    Marland Kitchen HYDROcodone-acetaminophen (NORCO) 10-325 MG tablet Take 1 tablet by mouth 4 (four) times daily.     Marland Kitchen losartan (COZAAR) 100 MG tablet Take 100 mg by mouth daily.    . methocarbamol (ROBAXIN) 750 MG tablet Take 750 mg by mouth 3 (three) times daily.  5  . primidone (MYSOLINE) 50 MG tablet TAKE 6  TABLETS(300 MG) BY MOUTH AT BEDTIME 180 tablet 6  . sertraline (ZOLOFT) 50 MG tablet Take 1.5 tablets (75 mg total) by mouth daily. 135 tablet 0  . spironolactone (ALDACTONE) 50 MG tablet Take 50 mg by mouth 2 (two) times daily.    Marland Kitchen topiramate (TOPAMAX) 25 MG tablet Take 1 tablet (25 mg total) by mouth at bedtime. Take with dinner 90 tablet 0  . Vitamin D, Ergocalciferol, (DRISDOL) 50000 units CAPS capsule Take 1 capsule (50,000 Units total) by mouth every 7 (seven) days. 5 capsule 3   No current facility-administered medications for this visit.     Allergies:   Ace inhibitors   Social History:  The patient  reports that she has never smoked. She has never used smokeless tobacco. She reports that she does not drink alcohol or use drugs.   Family History:  The patient's  family history includes Atrial fibrillation in her sister; Cancer in her maternal grandfather; Diabetes in her maternal grandmother and mother; Hyperlipidemia in her mother; Hypertension in her maternal grandmother and mother; Kidney disease in her father and maternal grandmother; Pancreatic cancer in her maternal grandmother; Sarcoidosis in her mother.   ROS:  Please see the history of present illness.   All other systems are personally reviewed and negative.    Exam:    Vital Signs:  BP 121/87   Pulse 70   Wt 235 lb (106.6 kg)   LMP 09/17/2017 (Approximate)   BMI 37.36 kg/m    Well appearing, alert and conversant, regular work of breathing,  good skin color Eyes- anicteric, neuro- grossly intact, skin- no apparent rash or lesions or cyanosis, mouth- oral mucosa is pink   Labs/Other Tests and Data Reviewed:    Recent Labs: 07/05/2018: ALT 16; TSH 1.660 07/19/2018: BUN 9; Creatinine, Ser 1.05; Potassium 3.8; Sodium 138 02/06/2019: Hemoglobin 13.6; Platelet Count 346   Wt Readings from Last 3 Encounters:  03/23/19 235 lb (106.6 kg)  02/06/19 242 lb 4.8 oz (109.9 kg)  11/22/18 246 lb (111.6 kg)     Other  studies personally reviewed: Additional studies/ records that were reviewed today include: ECG 07/20/18 - personally reviewed sinus rhythm, rate 80    ASSESSMENT & PLAN:    1.  Hypertension: Appears to be fairly well controlled.  No changes.  2.  Palpitations: Tolerating carvedilol, though she is having weakness and fatigue.  Due to that, we Sherrey North switch her to Toprol-XL to take in the evenings.  I would imagine this would not change her symptoms.  3.  Murmur: Found by disability screener.  She is not symptomatic with shortness of breath.  We Bearl Talarico further evaluate at the next visit.  COVID 19 screen The patient denies symptoms of COVID 19  at this time.  The importance of social distancing was discussed today.  Follow-up: 6 months  Current medicines are reviewed at length with the patient today.   The patient does not have concerns regarding her medicines.  The following changes were made today: Stop carvedilol, start Toprol-XL  Labs/ tests ordered today include:  No orders of the defined types were placed in this encounter.    Patient Risk:  after full review of this patients clinical status, I feel that they are at moderate risk at this time.  Today, I have spent 15 minutes with the patient with telehealth technology discussing palpitations, fatigue.    Signed, Eris Hannan Meredith Leeds, MD  03/23/2019 10:16 AM     [facility] [address] [place] [address] [phone] (office) [phone] (fax)

## 2019-03-23 NOTE — Telephone Encounter (Signed)
Needs clarification on medication direction for metoprolol succinate (TOPROL-XL) 100 MG 24 hr tablet.Marland Kitchen

## 2019-03-23 NOTE — Patient Instructions (Signed)
Medication Instructions:  Your physician has recommended you make the following change in your medication:  1. STOP Carvedilol 2. START Metoprolol Succinate (Toprol) 100 mg once daily at bedtime  * If you need a refill on your cardiac medications before your next appointment, please call your pharmacy.   Labwork: None ordered  Testing/Procedures: None ordered  Follow-Up: Your physician wants you to follow-up in: 6 months with Dr. Curt Bears.  You will receive a reminder letter in the mail two months in advance. If you don't receive a letter, please call our office to schedule the follow-up appointment.  Thank you for choosing [facility]!!   Trinidad Curet, RN [phone]

## 2019-03-27 ENCOUNTER — Other Ambulatory Visit (HOSPITAL_COMMUNITY): Payer: Self-pay

## 2019-03-27 DIAGNOSIS — F5101 Primary insomnia: Secondary | ICD-10-CM

## 2019-03-27 NOTE — Progress Notes (Signed)
Virtual Visit via Video Note The purpose of this virtual visit is to provide medical care while limiting exposure to the novel coronavirus.    Consent was obtained for video visit:  Yes.   Answered questions that patient had about telehealth interaction:  Yes.   I discussed the limitations, risks, security and privacy concerns of performing an evaluation and management service by telemedicine. I also discussed with the patient that there may be a patient responsible charge related to this service. The patient expressed understanding and agreed to proceed.  Pt location: Home Physician Location: office Name of referring provider:  Azzie Glatter, FNP I connected with Erin Bradford at patients initiation/request on 03/28/2019 at 10:00 AM EDT by video enabled telemedicine application and verified that I am speaking with the correct person using two identifiers. Pt MRN:  [id number] Pt DOB:  02-07-1971   History of Present Illness:  Erin Bradford is a 48 year old right-handed female with hypertension, iron deficiency anemia, prediabetes, mild chronic kidney disease, and anxiety who follows up for essential tremor.  UPDATE: She currently takes primidone 300mg  at bedtime.  She also takes topiramate 25mg  in the evening for anxiety and overeating at night as prescribed by her psychiatrist.    Tremors are controlled.  She reports worsening memory.  She reports increased word finding difficulty and losing her thoughts when she wants to say something.  She denies forgetting conversations, repeating questions, paying bills, or disorientation driving on familiar routes.  She reports that her mother has some memory issues but has not been diagnosed with dementia.  She reports that her grandmother developed some dementia at age 37.  She reports depression, anxiety and difficulty sleeping.  HISTORY: Beginning in 2017, she reports tremor in both hands. She only notices it when she is  performing a task, but not at rest. 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 She also reports difficulty pouring liquids in a spoon or using utensils. It does not affect writing. She denies any triggers or anything that improves symptoms. She thinks it has gotten a little worse over the past year. She denies starting any new medication prior to onset of symptoms. She denies family history of tremor. TSH was 0.92.   She endorses short term memory problems. She frequently is experiencing word-finding difficulties. This has been ongoing for about a year but has gotten worse over the past 3 months. Other than the primidone, she has not had any changes or new medications. She has not had trouble with finances or disorientation on familiar routes. To evaluate memory deficits, B12 and TSH were checked, which were 663 and 1.05 respectively.She has since started seeing a therapist and is doing much better. It was determined that her memory problems were related to depression.  Past Medical History: Past Medical History:  Diagnosis Date  . Abnormal laboratory test   . Allergy   . Anemia   . Anxiety   . Arthritis   . Blood transfusion without reported diagnosis   . BMI 39.0-39.9,adult   . Chronic hypokalemia   . Chronic kidney disease    mild  . Depression   . GERD (gastroesophageal reflux disease)   . Hypertension   . Insomnia   . Low back pain   . Obesity   . Occasional tremors   . Palpitations    dx with tachycardia   . PONV (postoperative nausea and vomiting)   . Tiredness    Medications: Outpatient Encounter Medications  as of 03/28/2019  Medication Sig  . ACCU-CHEK AVIVA PLUS test strip USE TO CHECK BLOOD SUGAR TWICE WEEKLY  . buPROPion (WELLBUTRIN XL) 300 MG 24 hr tablet Take 1 tablet (300 mg total) by mouth daily.  . cetirizine (ZYRTEC) 10 MG tablet Take 1 tablet (10 mg total) by mouth at bedtime as needed for allergies.  Marland Kitchen doxepin (SINEQUAN) 10 MG  capsule Take 1 capsule (10 mg total) by mouth at bedtime.  . Fluocinolone Acetonide Body 0.01 % OIL Place 2 drops into both ears 2 (two) times daily as needed (for ear pain).   . fluticasone (FLONASE) 50 MCG/ACT nasal spray Place 2 sprays into both nostrils daily.  . furosemide (LASIX) 20 MG tablet Take 20 mg by mouth 2 (two) times daily.   Marland Kitchen gabapentin (NEURONTIN) 300 MG capsule Take 300 mg by mouth 3 (three) times daily.   . Glucose Blood (BLOOD GLUCOSE TEST STRIPS) STRP 2 (two) times a week.  Marland Kitchen HYDROcodone-acetaminophen (NORCO) 10-325 MG tablet Take 1 tablet by mouth 4 (four) times daily.   Marland Kitchen losartan (COZAAR) 100 MG tablet Take 100 mg by mouth daily.  . methocarbamol (ROBAXIN) 750 MG tablet Take 750 mg by mouth 3 (three) times daily.  . metoprolol succinate (TOPROL-XL) 100 MG 24 hr tablet Take 1 tablet (100 mg total) by mouth daily. Take with or immediately following a meal. Take at bedtime  . primidone (MYSOLINE) 50 MG tablet TAKE 6 TABLETS(300 MG) BY MOUTH AT BEDTIME  . sertraline (ZOLOFT) 50 MG tablet Take 1.5 tablets (75 mg total) by mouth daily.  Marland Kitchen spironolactone (ALDACTONE) 50 MG tablet Take 50 mg by mouth 2 (two) times daily.  Marland Kitchen topiramate (TOPAMAX) 25 MG tablet Take 1 tablet (25 mg total) by mouth at bedtime. Take with dinner  . Vitamin D, Ergocalciferol, (DRISDOL) 50000 units CAPS capsule Take 1 capsule (50,000 Units total) by mouth every 7 (seven) days.  . [DISCONTINUED] carvedilol (COREG) 12.5 MG tablet Take 1 & 1/2 tablets (18.75 mg total) by mouth twice daily   No facility-administered encounter medications on file as of 03/28/2019.    Allergies:  Allergies  Allergen Reactions  . Ace Inhibitors Anaphylaxis and Swelling    Swelling of tongue.   Family History: Family History  Problem Relation Age of Onset  . Diabetes Mother   . Hypertension Mother   . Hyperlipidemia Mother   . Sarcoidosis Mother   . Kidney disease Father   . Atrial fibrillation Sister   . Kidney  disease Maternal Grandmother   . Pancreatic cancer Maternal Grandmother   . Diabetes Maternal Grandmother   . Hypertension Maternal Grandmother   . Cancer Maternal Grandfather   . Colon cancer Neg Hx   . Colon polyps Neg Hx   . Esophageal cancer Neg Hx   . Stomach cancer Neg Hx   . Rectal cancer Neg Hx    Review Of Systems: Review of Systems  Constitutional: Negative for chills and fever.  HENT: Negative for ear discharge, ear pain, sore throat and tinnitus.   Eyes: Negative for blurred vision, double vision, pain, discharge and redness.  Respiratory: Negative for cough and shortness of breath.   Cardiovascular: Negative for chest pain and palpitations.  Gastrointestinal: Negative for abdominal pain, constipation, diarrhea, nausea and vomiting.  Genitourinary: Negative for dysuria.  Musculoskeletal: Negative for myalgias.  Skin: Negative for itching and rash.  Neurological: Positive for tremors.  Endo/Heme/Allergies: Negative for environmental allergies. Does not bruise/bleed easily.  Psychiatric/Behavioral: Positive for depression  and memory loss. The patient is nervous/anxious and has insomnia.    Observations/Objective:   Blood pressure 109/71, pulse 72, height 5' 6.5" (1.689 m), weight 235 lb (106.6 kg), last menstrual period 09/17/2017. alert and oriented to person, place, and time. Attention span and concentration intact, recent and remote memory intact, fund of knowledge intact.  Speech fluent and not dysarthric, language intact.   MMSE - Mini Mental State Exam 03/28/2019  Orientation to time 5  Orientation to Place 5  Registration 3  Attention/ Calculation 5  Recall 3  Language- name 2 objects 2  Language- repeat 1  Language- follow 3 step command 3  Language- read & follow direction 1  Write a sentence 1  Copy design 1  Total score 30   Pupils round and equal.  Eyes move in all directions.  Face symmetric.    Assessment and Plan:   1.  Essential tremor, stable.  2.  Memory deficits.  I do not appreciate any cognitive impairment that would suggest mild cognitive impairment much less dementia.  Likely related to depression, anxiety and lack of sleep.    1.  primidone 300mg  at bedtime.  Also on topiramate 25mg  daily to suppress appetite, which also happens to treat tremor. 2.  Will monitor memory. 3.  Follow up in 6 months.  Follow Up Instructions:    -I discussed the assessment and treatment plan with the patient. The patient was provided an opportunity to ask questions and all were answered. The patient agreed with the plan and demonstrated an understanding of the instructions.   The patient was advised to call back or seek an in-person evaluation if the symptoms worsen or if the condition fails to improve as anticipated.   Dudley Major, DO

## 2019-03-28 ENCOUNTER — Telehealth (INDEPENDENT_AMBULATORY_CARE_PROVIDER_SITE_OTHER): Payer: Medicaid Other | Admitting: Neurology

## 2019-03-28 ENCOUNTER — Other Ambulatory Visit: Payer: Self-pay

## 2019-03-28 ENCOUNTER — Telehealth (HOSPITAL_COMMUNITY): Payer: Self-pay

## 2019-03-28 ENCOUNTER — Encounter: Payer: Self-pay | Admitting: Neurology

## 2019-03-28 VITALS — BP 109/71 | HR 72 | Ht 66.5 in | Wt 235.0 lb

## 2019-03-28 DIAGNOSIS — R413 Other amnesia: Secondary | ICD-10-CM

## 2019-03-28 DIAGNOSIS — G250 Essential tremor: Secondary | ICD-10-CM | POA: Diagnosis not present

## 2019-03-28 DIAGNOSIS — F5101 Primary insomnia: Secondary | ICD-10-CM

## 2019-03-28 NOTE — Telephone Encounter (Signed)
Patient is calling to report that the Doxepin is not working. Patient said she read on line that the normal dose is 100 mg and she wants to know why she is on such a low dose. Please review and advise, thank you

## 2019-03-28 NOTE — Telephone Encounter (Signed)
Because she is also taking wellbutrin and zoloft. Higher dose can cause tremors. She can try 50 mg at bed time.

## 2019-03-28 NOTE — Patient Instructions (Signed)
Primidone 300mg  at bedtime Follow up in 6 months

## 2019-03-29 MED ORDER — DOXEPIN HCL 50 MG PO CAPS: 50.0000 mg | ORAL_CAPSULE | Freq: Every day | ORAL | 0 refills | Status: DC

## 2019-03-29 NOTE — Telephone Encounter (Signed)
50 mg Doxepin sent to the pharmacy, I called patient to let her know

## 2019-04-02 ENCOUNTER — Encounter: Payer: Self-pay | Admitting: Family Medicine

## 2019-04-02 ENCOUNTER — Other Ambulatory Visit: Payer: Self-pay | Admitting: Family Medicine

## 2019-04-02 DIAGNOSIS — R52 Pain, unspecified: Secondary | ICD-10-CM

## 2019-04-03 ENCOUNTER — Ambulatory Visit: Payer: Self-pay | Admitting: Neurology

## 2019-04-04 DIAGNOSIS — E876 Hypokalemia: Secondary | ICD-10-CM | POA: Diagnosis not present

## 2019-04-04 DIAGNOSIS — F419 Anxiety disorder, unspecified: Secondary | ICD-10-CM | POA: Diagnosis not present

## 2019-04-04 DIAGNOSIS — I129 Hypertensive chronic kidney disease with stage 1 through stage 4 chronic kidney disease, or unspecified chronic kidney disease: Secondary | ICD-10-CM | POA: Diagnosis not present

## 2019-04-04 DIAGNOSIS — R7989 Other specified abnormal findings of blood chemistry: Secondary | ICD-10-CM | POA: Diagnosis not present

## 2019-04-04 DIAGNOSIS — Z6841 Body Mass Index (BMI) 40.0 and over, adult: Secondary | ICD-10-CM | POA: Diagnosis not present

## 2019-04-04 DIAGNOSIS — G8929 Other chronic pain: Secondary | ICD-10-CM | POA: Diagnosis not present

## 2019-04-12 ENCOUNTER — Telehealth: Payer: Self-pay | Admitting: Cardiology

## 2019-04-12 DIAGNOSIS — G894 Chronic pain syndrome: Secondary | ICD-10-CM | POA: Diagnosis not present

## 2019-04-12 DIAGNOSIS — M545 Low back pain: Secondary | ICD-10-CM | POA: Diagnosis not present

## 2019-04-12 DIAGNOSIS — Z79891 Long term (current) use of opiate analgesic: Secondary | ICD-10-CM | POA: Diagnosis not present

## 2019-04-12 DIAGNOSIS — M79604 Pain in right leg: Secondary | ICD-10-CM | POA: Diagnosis not present

## 2019-04-12 DIAGNOSIS — M79605 Pain in left leg: Secondary | ICD-10-CM | POA: Diagnosis not present

## 2019-04-12 NOTE — Telephone Encounter (Signed)
Pt see's Dr. Curt Bears

## 2019-04-12 NOTE — Telephone Encounter (Signed)
Disregard previous message. I copied the wrong message text by accident.  The pt medication should have been metoprolol succinate (TOPROL-XL) 100 MG 24 hr tablet   Pt was extremely pleasant on the phone. The other message was meant to go to a supervisor

## 2019-04-12 NOTE — Telephone Encounter (Signed)
Pt calling in to report that she has been retaining fluid since starting Toprol. Reports edema is in her arms, hands, face, neck, legs and feet. Pt does not know how much weight she has gained d/t fluid retention. Denies SOB, CP, dizziness/syncope. Pt was seen 3/27 via virtual visit and carvedilol was stopped d/t weakness/fatigue and switched to Toprol @ HS. Pt understands I will forward to Dr. Curt Bears for advisement and call her tomorrow with his recommendation/s. Pt agreeable to plan.   (Dr. Curt Bears she is on Lasix 20 mg BID)

## 2019-04-12 NOTE — Telephone Encounter (Signed)
Pt c/o medication issue:  1. Name of Medication:  metoprolol succinate (TOPROL-XL) 100 MG 24 hr tablet  2. How are you currently taking this medication (dosage and times per day)? 1x daily  3. Are you having a reaction (difficulty breathing--STAT)? No   4. What is your medication issue? Excess water retention   Pt tried to send a message through [place], but was unsuccessful.

## 2019-04-12 NOTE — Telephone Encounter (Deleted)
Pt tried to send a MyChart message, but was unsuccessful.   Pt says the new medication may not be working. She is retaining a lot of fluid  Pt c/o medication issue:  1. Name of Medication:  She was extremely rude to me, and told me that no one in our office is competent, we are all terrible at our jobs, and she was going to hire a Chief Executive Officer and sue Dr. Harrington Challenger and Dr. Lovena Le for putting in her pacemaker wrong. She also said when she dies she is going to spread her ashes all over our office so we never forget her.    2. How are you currently taking this medication (dosage and times per day)? 1x daily at beditime  3. Are you having a reaction (difficulty breathing--STAT)? no  4. What is your medication issue?pt is retaining fluid. She says she has never had an issue like this before.

## 2019-04-13 ENCOUNTER — Other Ambulatory Visit: Payer: Self-pay | Admitting: Cardiology

## 2019-04-13 NOTE — Telephone Encounter (Signed)
Advised pt to hold Toprol temporarily, per Dr. Curt Bears. She understands I will follow up with her next week to see if improvement in swelling. Pt will restart her Carvedilol for palpitations, in the meantime, per her preference. She did not have swelling on this medication and would prefer to be on something for palpitations than nothing.  She adds that she would father deal with the fatigue SE of Carvedilol that having nothing/swelling. Pt appreciates my call and agreeable to plan.

## 2019-04-18 MED ORDER — CARVEDILOL 12.5 MG PO TABS: 18.7500 mg | ORAL_TABLET | Freq: Two times a day (BID) | ORAL | 3 refills | Status: DC

## 2019-04-18 NOTE — Telephone Encounter (Signed)
Followed up with pt who reports edema has improved greatly since stopping Toprol. Pt has resumed her Carvedilol and prefers to stay on this for now.  She will call the office back if fatigue becomes an issue and she would like to try a different medication. Medication list updated.  Will forward to [facility] for review/FYI.

## 2019-04-19 ENCOUNTER — Other Ambulatory Visit: Payer: Self-pay | Admitting: Cardiology

## 2019-04-19 MED ORDER — CARVEDILOL 12.5 MG PO TABS: 18.7500 mg | ORAL_TABLET | Freq: Two times a day (BID) | ORAL | 3 refills | Status: DC

## 2019-04-19 NOTE — Telephone Encounter (Signed)
Pt's medication was sent to pt's pharmacy as requested. Confirmation received.  °

## 2019-04-23 ENCOUNTER — Telehealth (HOSPITAL_COMMUNITY): Payer: Self-pay

## 2019-04-23 DIAGNOSIS — F330 Major depressive disorder, recurrent, mild: Secondary | ICD-10-CM

## 2019-04-23 DIAGNOSIS — F5101 Primary insomnia: Secondary | ICD-10-CM

## 2019-04-23 MED ORDER — ASENAPINE MALEATE 5 MG SL SUBL: 5.0000 mg | SUBLINGUAL_TABLET | Freq: Every day | SUBLINGUAL | 0 refills | Status: DC

## 2019-04-23 NOTE — Telephone Encounter (Signed)
Spoke to patient. Does not feel doxepin working and also she is complaining of expense as she has to pay for the doxepin.  We have tried hydroxyzine, Ambien, Thorazine, doxepin with poor outcome.  She does not want any medication that cause weight gain.  I recommend to try Saphris 5mg  at bed time.  I recommend to discontinue doxepin and not to take Topamax which is given for food craving.  Discussed medication side effects.  Recommend to call us back if symptoms remain the same.  We may need to try olanzapine if Saphris did not work.  I will call Saphris to her pharmacy.

## 2019-04-23 NOTE — Telephone Encounter (Signed)
I called the number you provided. She did not pick up the phone and I left message. Her psychiatric medications are generic.

## 2019-04-23 NOTE — Telephone Encounter (Signed)
Patient is calling about her sleeping medication, She said that all of these different medications cost her co pays that she can not afford and they are not working. Patient would like to speak to you. [phone]

## 2019-04-26 DIAGNOSIS — M47816 Spondylosis without myelopathy or radiculopathy, lumbar region: Secondary | ICD-10-CM | POA: Diagnosis not present

## 2019-04-26 DIAGNOSIS — M47812 Spondylosis without myelopathy or radiculopathy, cervical region: Secondary | ICD-10-CM | POA: Diagnosis not present

## 2019-04-26 DIAGNOSIS — G894 Chronic pain syndrome: Secondary | ICD-10-CM | POA: Diagnosis not present

## 2019-04-26 DIAGNOSIS — M48061 Spinal stenosis, lumbar region without neurogenic claudication: Secondary | ICD-10-CM | POA: Diagnosis not present

## 2019-04-26 DIAGNOSIS — M545 Low back pain: Secondary | ICD-10-CM | POA: Diagnosis not present

## 2019-04-26 DIAGNOSIS — M5136 Other intervertebral disc degeneration, lumbar region: Secondary | ICD-10-CM | POA: Diagnosis not present

## 2019-04-30 ENCOUNTER — Other Ambulatory Visit: Payer: Self-pay | Admitting: Family Medicine

## 2019-04-30 ENCOUNTER — Telehealth: Payer: Self-pay

## 2019-04-30 DIAGNOSIS — F419 Anxiety disorder, unspecified: Secondary | ICD-10-CM

## 2019-04-30 NOTE — Telephone Encounter (Signed)
Patient would like a referral for psychiatry because she is not happy with the current provider.

## 2019-05-01 NOTE — Telephone Encounter (Signed)
Patient notified that referral has been sent to knew psychiatrist

## 2019-05-02 ENCOUNTER — Ambulatory Visit (INDEPENDENT_AMBULATORY_CARE_PROVIDER_SITE_OTHER): Payer: Medicaid Other | Admitting: Psychiatry

## 2019-05-02 ENCOUNTER — Encounter (HOSPITAL_COMMUNITY): Payer: Self-pay | Admitting: Psychiatry

## 2019-05-02 ENCOUNTER — Other Ambulatory Visit: Payer: Self-pay

## 2019-05-02 DIAGNOSIS — F5101 Primary insomnia: Secondary | ICD-10-CM | POA: Diagnosis not present

## 2019-05-02 DIAGNOSIS — F330 Major depressive disorder, recurrent, mild: Secondary | ICD-10-CM | POA: Diagnosis not present

## 2019-05-02 MED ORDER — ZOLPIDEM TARTRATE ER 12.5 MG PO TBCR: 12.5000 mg | EXTENDED_RELEASE_TABLET | Freq: Every evening | ORAL | 0 refills | Status: DC | PRN

## 2019-05-02 NOTE — Progress Notes (Signed)
Virtual Visit via Telephone Note  I connected with Erin Bradford on 64/40/34 at 10:40 AM EDT by telephone and verified that I am speaking with the correct person using two identifiers.   I discussed the limitations, risks, security and privacy concerns of performing an evaluation and management service by telephone and the availability of in person appointments. I also discussed with the patient that there may be a patient responsible charge related to this service. The patient expressed understanding and agreed to proceed.   History of Present Illness: Patient was evaluated through phone session.  She endorsed poor sleep and up all night.  Though she denies any mood swings, crying spells or any feeling of hopelessness but endorsed her biggest concern is poor sleep.  She had tried many medication and recently we tried doxepin 50 mg and then Saphris 5 mg with poor outcome.  She reported that Ambien used to work for a while and then stopped working.  I reviewed her medication.  She is taking phentermine for the past few months which she never mentioned before.  She admitted since taking the phentermine she has noticed sleep issues.  She do not mean that phentermine can cause insomnia.  She also like to try Ambien CR because her mother takes and works very well for her.  She does not want to change her other antidepressant which she believes seems to be working well.  She is taking Wellbutrin and Zoloft.  She reported no tremors, shakes or any EPS.  She is no longer taking Topamax which we recommend to stop once we started the Saphris.  She is taking pain medication.  She is not interested in therapy.  She denies any feeling of hopelessness or worthlessness.  She admitted her appetite is fair.  Past Psychiatric History: Reviewed H/O depression.  No h/o inpatient treatment, mania, psychosisor suicidal attempt. Tried Prozac which worked but caused weight gain. Tried Vistaril, trazodone, Thorazine,  Lunesta 3 mg, Ambien, Doxepin 50 mg and saphris 5 mg for insomnia with poor outcome.We tried Lamictal but that caused a rash.    Observations/Objective: Mental status examination done on the phone.  Patient described her mood tired because of lack of sleep.  Her speech is slow but clear and coherent.  Her thought process logical and goal-directed.  There were no delusions, paranoia or grandiosity.  She denies any suicidal thoughts or homicidal thought.  She denies any auditory or visual hallucination.  She is alert and oriented x3.  There were no loose association or flight of ideas.  Her fund of knowledge is adequate.  Her attention and concentration is fair.  Her memory is intact.  Her insight and judgment is okay.  Assessment and Plan: Major depressive disorder, recurrent.  Primary insomnia.  I have a long discussion with the patient about psychotropic medication, dosage and efficacy.  She realized since taking the phentermine for weight loss she started having insomnia.  In past few months we have tried numerous medication to help sleep which includes doxepin, Thorazine, Lunesta, Saphris, trazodone, hydroxyzine with poor outcome.  She does not want any medication that cause weight gain.  She is no longer taking Topamax which was recommended to stop when we started the Saphris.  She like to try Ambien CR which her mother takes with good response.  I also suggested that she should try to stop the phentermine as it may cause insomnia.  She is willing to give a try to stopping the phentermine but is  still need Ambien CR if she cannot sleep.  I will call the prescription Ambien CR 12.5 mg at bedtime.  She is taking pain medication and I discussed interaction with her pain medicine and should not be take together.  She like to continue Zoloft 75 mg daily and Wellbutrin XL 300 mg daily.  She is not interested in therapy.  Discussed medication side effects and benefits.  Recommended to call us back if she  has any question or any concern.  She wanted to have a follow-up visit in 2 weeks.  Follow Up Instructions:    I discussed the assessment and treatment plan with the patient. The patient was provided an opportunity to ask questions and all were answered. The patient agreed with the plan and demonstrated an understanding of the instructions.   The patient was advised to call back or seek an in-person evaluation if the symptoms worsen or if the condition fails to improve as anticipated.  I provided 20 minutes of non-face-to-face time during this encounter.   [name], MD

## 2019-05-08 ENCOUNTER — Ambulatory Visit (INDEPENDENT_AMBULATORY_CARE_PROVIDER_SITE_OTHER): Payer: Medicaid Other | Admitting: Family Medicine

## 2019-05-08 ENCOUNTER — Other Ambulatory Visit: Payer: Self-pay

## 2019-05-08 DIAGNOSIS — R350 Frequency of micturition: Secondary | ICD-10-CM

## 2019-05-08 DIAGNOSIS — I10 Essential (primary) hypertension: Secondary | ICD-10-CM

## 2019-05-08 DIAGNOSIS — F419 Anxiety disorder, unspecified: Secondary | ICD-10-CM

## 2019-05-08 DIAGNOSIS — K219 Gastro-esophageal reflux disease without esophagitis: Secondary | ICD-10-CM | POA: Diagnosis not present

## 2019-05-08 DIAGNOSIS — R634 Abnormal weight loss: Secondary | ICD-10-CM | POA: Diagnosis not present

## 2019-05-08 NOTE — Progress Notes (Signed)
Virtual Visit via Telephone Note  I connected with Erin Bradford on 93/79/02 at  1:40 PM EDT by telephone and verified that I am speaking with the correct person using two identifiers.   I discussed the limitations, risks, security and privacy concerns of performing an evaluation and management service by telephone and the availability of in person appointments. I also discussed with the patient that there may be a patient responsible charge related to this service. The patient expressed understanding and agreed to proceed.   History of Present Illness:  Past Medical History:  Diagnosis Date  . Abnormal laboratory test   . Allergy   . Anemia   . Anxiety   . Arthritis   . Blood transfusion without reported diagnosis   . BMI 39.0-39.9,adult   . Chronic hypokalemia   . Chronic kidney disease    mild  . Depression   . GERD (gastroesophageal reflux disease)   . Heart murmur   . Hypertension   . Insomnia   . Low back pain   . Obesity   . Occasional tremors   . Palpitations    dx with tachycardia   . PONV (postoperative nausea and vomiting)   . Tiredness     Current Outpatient Medications on File Prior to Visit  Medication Sig Dispense Refill  . ACCU-CHEK AVIVA PLUS test strip USE TO CHECK BLOOD SUGAR TWICE WEEKLY  11  . AMITIZA 24 MCG capsule     . buPROPion (WELLBUTRIN XL) 300 MG 24 hr tablet Take 1 tablet (300 mg total) by mouth daily. 90 tablet 0  . carvedilol (COREG) 12.5 MG tablet Take 1.5 tablets (18.75 mg total) by mouth 2 (two) times daily. 270 tablet 3  . cetirizine (ZYRTEC) 10 MG tablet Take 1 tablet (10 mg total) by mouth at bedtime as needed for allergies. 30 tablet 6  . Fluocinolone Acetonide Body 0.01 % OIL Place 2 drops into both ears 2 (two) times daily as needed (for ear pain).     . fluticasone (FLONASE) 50 MCG/ACT nasal spray Place 2 sprays into both nostrils daily. 16 g 6  . furosemide (LASIX) 20 MG tablet Take 20 mg by mouth 2 (two) times daily.     Marland Kitchen  gabapentin (NEURONTIN) 300 MG capsule Take 300 mg by mouth 3 (three) times daily.     . Glucose Blood (BLOOD GLUCOSE TEST STRIPS) STRP 2 (two) times a week.    Marland Kitchen HYDROcodone-acetaminophen (NORCO) 10-325 MG tablet Take 1 tablet by mouth 4 (four) times daily.     Marland Kitchen losartan (COZAAR) 100 MG tablet Take 100 mg by mouth daily.    . methocarbamol (ROBAXIN) 750 MG tablet Take 750 mg by mouth 3 (three) times daily.  5  . phentermine (ADIPEX-P) 37.5 MG tablet Take by mouth.    . primidone (MYSOLINE) 50 MG tablet TAKE 6 TABLETS(300 MG) BY MOUTH AT BEDTIME 180 tablet 6  . sertraline (ZOLOFT) 50 MG tablet Take 1.5 tablets (75 mg total) by mouth daily. 135 tablet 0  . spironolactone (ALDACTONE) 50 MG tablet Take 50 mg by mouth 2 (two) times daily.    . Vitamin D, Ergocalciferol, (DRISDOL) 50000 units CAPS capsule Take 1 capsule (50,000 Units total) by mouth every 7 (seven) days. 5 capsule 3  . zolpidem (AMBIEN CR) 12.5 MG CR tablet Take 1 tablet (12.5 mg total) by mouth at bedtime as needed for sleep. 30 tablet 0   No current facility-administered medications on file prior to visit.  Current Status: Since her last office visit, she has been experiencing urinary urgency and frequency X 3 days. She states that she is unable to hold her urine and has to use restroom often and barely gets to restroom in time. She has not been using any medication for relief. She has recently had follow up with Psychiatrist. She has c/o acid reflux. No reports of GI problems such as nausea, vomiting, diarrhea, and constipation. She has no reports of blood in stools, dysuria and hematuria. She has ha a total of 40 lb weight loss, since last year. She previously has Gastric Sleeve procedure last year. Her anxiety is mild today.   She denies fevers, chills, fatigue, recent infections, weight loss, and night sweats. She has not had any headaches, visual changes, dizziness, and falls. No chest pain, heart palpitations, cough and  shortness of breath reported. She denies pain today.   Observations/Objective:  Telephone Virtual Visit   Assessment and Plan:  1. Gastroesophageal reflux disease without esophagitis We will initiated Famotidine today.   2. Anxiety She will continue Bupropion as prescribed.   3. Weight loss of more than 10% body weight 40 lb weight loss in < 1 year, r/t Gastric Sleeve procedure. She will continue with weight loss goal.   4. Hypertension, unspecified type She will continue to decrease high sodium intake, excessive alcohol intake, increase potassium intake, smoking cessation, and increase physical activity of at least 30 minutes of cardio activity daily. She will continue to follow Heart Healthy or DASH diet.  5. Urinary frequency We will initiate Ditropan today.   Follow Up Instructions:  She will follow up in 3 months.    I discussed the assessment and treatment plan with the patient. The patient was provided an opportunity to ask questions and all were answered. The patient agreed with the plan and demonstrated an understanding of the instructions.   The patient was advised to call back or seek an in-person evaluation if the symptoms worsen or if the condition fails to improve as anticipated.  I provided 15 minutes of non-face-to-face time during this encounter.   Azzie Glatter, FNP

## 2019-05-09 ENCOUNTER — Encounter: Payer: Self-pay | Admitting: Family Medicine

## 2019-05-09 DIAGNOSIS — K219 Gastro-esophageal reflux disease without esophagitis: Secondary | ICD-10-CM | POA: Insufficient documentation

## 2019-05-09 DIAGNOSIS — R350 Frequency of micturition: Secondary | ICD-10-CM | POA: Insufficient documentation

## 2019-05-09 DIAGNOSIS — R634 Abnormal weight loss: Secondary | ICD-10-CM | POA: Insufficient documentation

## 2019-05-09 DIAGNOSIS — F419 Anxiety disorder, unspecified: Secondary | ICD-10-CM | POA: Insufficient documentation

## 2019-05-09 MED ORDER — FAMOTIDINE 20 MG PO TABS: 20.0000 mg | ORAL_TABLET | Freq: Two times a day (BID) | ORAL | 3 refills | Status: DC

## 2019-05-09 MED ORDER — OXYBUTYNIN CHLORIDE ER 10 MG PO TB24: 10.0000 mg | ORAL_TABLET | Freq: Every day | ORAL | 3 refills | Status: DC

## 2019-05-10 DIAGNOSIS — M79604 Pain in right leg: Secondary | ICD-10-CM | POA: Diagnosis not present

## 2019-05-10 DIAGNOSIS — G894 Chronic pain syndrome: Secondary | ICD-10-CM | POA: Diagnosis not present

## 2019-05-10 DIAGNOSIS — Z79891 Long term (current) use of opiate analgesic: Secondary | ICD-10-CM | POA: Diagnosis not present

## 2019-05-10 DIAGNOSIS — M545 Low back pain: Secondary | ICD-10-CM | POA: Diagnosis not present

## 2019-05-10 DIAGNOSIS — M79605 Pain in left leg: Secondary | ICD-10-CM | POA: Diagnosis not present

## 2019-05-10 NOTE — Telephone Encounter (Signed)
Left a detail VM.

## 2019-05-16 ENCOUNTER — Ambulatory Visit (INDEPENDENT_AMBULATORY_CARE_PROVIDER_SITE_OTHER): Payer: Medicaid Other | Admitting: Psychiatry

## 2019-05-16 ENCOUNTER — Encounter (HOSPITAL_COMMUNITY): Payer: Self-pay | Admitting: Psychiatry

## 2019-05-16 ENCOUNTER — Other Ambulatory Visit: Payer: Self-pay

## 2019-05-16 DIAGNOSIS — F5101 Primary insomnia: Secondary | ICD-10-CM | POA: Diagnosis not present

## 2019-05-16 DIAGNOSIS — F330 Major depressive disorder, recurrent, mild: Secondary | ICD-10-CM | POA: Diagnosis not present

## 2019-05-16 MED ORDER — SERTRALINE HCL 50 MG PO TABS: 75.0000 mg | ORAL_TABLET | Freq: Every day | ORAL | 0 refills | Status: DC

## 2019-05-16 MED ORDER — ZOLPIDEM TARTRATE ER 12.5 MG PO TBCR: 12.5000 mg | EXTENDED_RELEASE_TABLET | Freq: Every evening | ORAL | 2 refills | Status: DC | PRN

## 2019-05-16 MED ORDER — BUPROPION HCL ER (XL) 300 MG PO TB24: 300.0000 mg | ORAL_TABLET | Freq: Every day | ORAL | 0 refills | Status: DC

## 2019-05-16 NOTE — Progress Notes (Signed)
Virtual Visit via Telephone Note  I connected with Erin Bradford on 16/10/[birth year] at 10:40 AM EDT by telephone and verified that I am speaking with the correct person using two identifiers.   I discussed the limitations, risks, security and privacy concerns of performing an evaluation and management service by telephone and the availability of in person appointments. I also discussed with the patient that there may be a patient responsible charge related to this service. The patient expressed understanding and agreed to proceed.   History of Present Illness: Patient doing much better since we started Ambien CR 12.5.  She is sleeping good.  She reported her mood is much better as she is sleeping 7 to 8 hours every night.  She is not eating as much and denies any crying spells or feeling sad or depressed.  She resume phentermine recently and she is happy that it is not causing insomnia.  She like to continue Zoloft and Wellbutrin at present dose.  She reported no tremors, shakes or any EPS.  Past Psychiatric History:Reviewed H/Odepression. No h/oinpatient treatment, mania, psychosisor suicidal attempt. Tried Prozac which worked but caused weight gain. Tried Vistaril, trazodone, Thorazine,Lunesta3 mg, Ambien, Doxepin 50 mg and saphris 5 mg for insomnia with poor outcome.We tried Lamictal but that caused a rash.  Psychiatric Specialty Exam: Physical Exam  ROS  Last menstrual period 09/17/2017.There is no height or weight on file to calculate BMI.  General Appearance: NA  Eye Contact:  NA  Speech:  Clear and Coherent and Normal Rate  Volume:  Normal  Mood:  Anxious  Affect:  Appropriate  Thought Process:  Goal Directed  Orientation:  Full (Time, Place, and Person)  Thought Content:  Logical  Suicidal Thoughts:  No  Homicidal Thoughts:  No  Memory:  Immediate;   Good Recent;   Good Remote;   Good  Judgement:  Good  Insight:  Good  Psychomotor Activity:  reported no tremor   Concentration:  Concentration: Good and Attention Span: Good  Recall:  Good  Fund of Knowledge:  Good  Language:  Good  Akathisia:  NA  Handed:  Right  AIMS (if indicated):     Assets:  Communication Skills Desire for Improvement Housing Social Support  ADL's:  Intact  Cognition:  WNL  Sleep:        Assessment and Plan: Major depressive disorder, recurrent.  Primary insomnia.  Patient doing better on Ambien CR 12.5.  She go back to take phentermine and noticed it is not causing insomnia.  She admitted weight loss in recent weeks and she like to continue the phentermine.  I would also continue Zoloft 75 mg daily and Wellbutrin XL 300 mg daily.  Discussed medication side effects and benefits.  Recommended to call us back if she has any question or any concern.  Follow-up in 3 months.  Follow Up Instructions:    I discussed the assessment and treatment plan with the patient. The patient was provided an opportunity to ask questions and all were answered. The patient agreed with the plan and demonstrated an understanding of the instructions.   The patient was advised to call back or seek an in-person evaluation if the symptoms worsen or if the condition fails to improve as anticipated.  I provided 15 minutes of non-face-to-face time during this encounter.   [name], MD

## 2019-06-06 DIAGNOSIS — M79604 Pain in right leg: Secondary | ICD-10-CM | POA: Diagnosis not present

## 2019-06-06 DIAGNOSIS — M545 Low back pain: Secondary | ICD-10-CM | POA: Diagnosis not present

## 2019-06-06 DIAGNOSIS — G894 Chronic pain syndrome: Secondary | ICD-10-CM | POA: Diagnosis not present

## 2019-06-06 DIAGNOSIS — Z79891 Long term (current) use of opiate analgesic: Secondary | ICD-10-CM | POA: Diagnosis not present

## 2019-06-06 DIAGNOSIS — M79605 Pain in left leg: Secondary | ICD-10-CM | POA: Diagnosis not present

## 2019-07-09 DIAGNOSIS — M545 Low back pain: Secondary | ICD-10-CM | POA: Diagnosis not present

## 2019-07-09 DIAGNOSIS — Z79891 Long term (current) use of opiate analgesic: Secondary | ICD-10-CM | POA: Diagnosis not present

## 2019-07-09 DIAGNOSIS — M79605 Pain in left leg: Secondary | ICD-10-CM | POA: Diagnosis not present

## 2019-07-09 DIAGNOSIS — M79604 Pain in right leg: Secondary | ICD-10-CM | POA: Diagnosis not present

## 2019-07-09 DIAGNOSIS — G894 Chronic pain syndrome: Secondary | ICD-10-CM | POA: Diagnosis not present

## 2019-07-14 ENCOUNTER — Other Ambulatory Visit: Payer: Self-pay | Admitting: Family Medicine

## 2019-07-14 DIAGNOSIS — E559 Vitamin D deficiency, unspecified: Secondary | ICD-10-CM

## 2019-07-23 ENCOUNTER — Other Ambulatory Visit: Payer: Self-pay

## 2019-07-23 DIAGNOSIS — J302 Other seasonal allergic rhinitis: Secondary | ICD-10-CM

## 2019-07-23 MED ORDER — CETIRIZINE HCL 10 MG PO TABS: 10.0000 mg | ORAL_TABLET | Freq: Every evening | ORAL | 1 refills | Status: DC | PRN

## 2019-07-23 NOTE — Telephone Encounter (Signed)
Patient notified

## 2019-08-03 DIAGNOSIS — M545 Low back pain: Secondary | ICD-10-CM | POA: Diagnosis not present

## 2019-08-10 ENCOUNTER — Ambulatory Visit: Payer: Self-pay | Admitting: Family Medicine

## 2019-08-10 ENCOUNTER — Telehealth: Payer: Self-pay

## 2019-08-10 ENCOUNTER — Telehealth: Payer: Self-pay | Admitting: Oncology

## 2019-08-10 NOTE — Telephone Encounter (Signed)
Patient given information to pain management

## 2019-08-10 NOTE — Telephone Encounter (Signed)
Confirmed 9/2 appointment with patient. Reschedule from 8/18 per patient.

## 2019-08-14 ENCOUNTER — Inpatient Hospital Stay: Payer: Medicaid Other

## 2019-08-14 ENCOUNTER — Inpatient Hospital Stay: Payer: Medicaid Other | Admitting: Oncology

## 2019-08-16 ENCOUNTER — Ambulatory Visit (INDEPENDENT_AMBULATORY_CARE_PROVIDER_SITE_OTHER): Payer: Medicaid Other | Admitting: Psychiatry

## 2019-08-16 ENCOUNTER — Other Ambulatory Visit: Payer: Self-pay

## 2019-08-16 ENCOUNTER — Encounter (HOSPITAL_COMMUNITY): Payer: Self-pay | Admitting: Psychiatry

## 2019-08-16 DIAGNOSIS — F330 Major depressive disorder, recurrent, mild: Secondary | ICD-10-CM

## 2019-08-16 DIAGNOSIS — F5101 Primary insomnia: Secondary | ICD-10-CM

## 2019-08-16 MED ORDER — BUPROPION HCL ER (XL) 300 MG PO TB24: 300.0000 mg | ORAL_TABLET | Freq: Every day | ORAL | 0 refills | Status: DC

## 2019-08-16 MED ORDER — ZOLPIDEM TARTRATE ER 12.5 MG PO TBCR: 12.5000 mg | EXTENDED_RELEASE_TABLET | Freq: Every evening | ORAL | 1 refills | Status: DC | PRN

## 2019-08-16 MED ORDER — SERTRALINE HCL 100 MG PO TABS: 100.0000 mg | ORAL_TABLET | Freq: Every day | ORAL | 0 refills | Status: DC

## 2019-08-16 NOTE — Progress Notes (Signed)
Virtual Visit via Telephone Note  I connected with Erin Bradford on [id number] at  2:00 PM EDT by telephone and verified that I am speaking with the correct person using two identifiers.   I discussed the limitations, risks, security and privacy concerns of performing an evaluation and management service by telephone and the availability of in person appointments. I also discussed with the patient that there may be a patient responsible charge related to this service. The patient expressed understanding and agreed to proceed.   History of Present Illness: Patient was evaluated through phone session.  She endorsed increased anxiety and nervousness.  2 weeks ago she had a panic attack which lasted for few minutes.  She had stopped taking phentermine which she felt caused increased anxiety and insomnia.  Since she stopped the phentermine she is sleeping better and taking Ambien as needed.  However she like to go up on Zoloft because she is experiencing lot of anxiety due to current situation.  Currently she is living with her mother but now she is moving to her own place with her 4 year old daughter.  She feels that she need to have her own place.  She also doing psychology through [employer] and lately she has a lot of anxiety due to online classes.  Sometimes she gets overwhelmed.  Her 16 year old daughter is doing well.  She is an Editor, commissioning.  Patient has no tremors, shakes or any EPS.  She has chronic pain and recently her physician changed her pain medication.  Patient denies any agitation, anger, mania or any psychosis.  She has no tremors or shakes.  She denies drinking or using any illegal substances.  Patient is on disability.  Her appetite is okay.  Her energy level is fair.  She reported her weight is unchanged from the past.   Past Psychiatric History:Reviewed H/Odepression. No h/oinpatient, mania, psychosisor suicidal attempt. Tried Prozac which worked but caused weight gain.  Tried Vistaril, trazodone, Thorazine,Lunesta3 mg,Ambien, Doxepin 50 mg and saphris 5 mgfor insomnia with poor outcome.We tried Lamictal but that caused a rash.   Psychiatric Specialty Exam: Physical Exam  ROS  Last menstrual period 09/17/2017.There is no height or weight on file to calculate BMI.  General Appearance: NA  Eye Contact:  NA  Speech:  Clear and Coherent and Normal Rate  Volume:  Decreased  Mood:  Anxious and Dysphoric  Affect:  NA  Thought Process:  Goal Directed  Orientation:  Full (Time, Place, and Person)  Thought Content:  Rumination  Suicidal Thoughts:  No  Homicidal Thoughts:  No  Memory:  Immediate;   Good Recent;   Good Remote;   Good  Judgement:  Good  Insight:  Good  Psychomotor Activity:  NA  Concentration:  Concentration: Good and Attention Span: Good  Recall:  Good  Fund of Knowledge:  Good  Language:  Good  Akathisia:  No  Handed:  Right  AIMS (if indicated):     Assets:  Communication Skills Desire for Improvement Housing Resilience Social Support  ADL's:  Intact  Cognition:  WNL  Sleep:   fair      Assessment and Plan: Primary insomnia.  Major depressive disorder, recurrent.  Patient is no longer taking phentermine which is helping her sleep but she still struggle with anxiety.  She has cut down her Ambien and takes only as needed.  Recommend to try Zoloft 100 mg daily and continue Wellbutrin XL 300 mg daily.  Encouraged to watch her calorie  intake and try to do regular exercise.  I offered therapy but at this time patient is not interested.  She admitted if symptoms do not improve with increase Zoloft and she will consider therapy.  Recommended to call us back if she has any question or any concern.  Follow-up in 3 months.  Follow Up Instructions:    I discussed the assessment and treatment plan with the patient. The patient was provided an opportunity to ask questions and all were answered. The patient agreed with the plan and  demonstrated an understanding of the instructions.   The patient was advised to call back or seek an in-person evaluation if the symptoms worsen or if the condition fails to improve as anticipated.  I provided 20 minutes of non-face-to-face time during this encounter.   [name], MD

## 2019-08-18 ENCOUNTER — Other Ambulatory Visit: Payer: Self-pay | Admitting: Family Medicine

## 2019-08-18 DIAGNOSIS — J302 Other seasonal allergic rhinitis: Secondary | ICD-10-CM

## 2019-08-23 ENCOUNTER — Other Ambulatory Visit: Payer: Self-pay | Admitting: Neurology

## 2019-08-24 NOTE — Telephone Encounter (Signed)
Requested Prescriptions   Pending Prescriptions Disp Refills  . primidone (MYSOLINE) 50 MG tablet [Pharmacy Med Name: PRIMIDONE 50MG  TABLETS] 180 tablet 6    Sig: TAKE 6 TABLETS(300 MG) BY MOUTH AT BEDTIME    Rx last filled: 01/15/19 #180 6 REFILLS  Pt last seen: 03/28/19   Follow up appt scheduled:09/28/19

## 2019-08-29 ENCOUNTER — Inpatient Hospital Stay: Payer: Medicaid Other | Admitting: Oncology

## 2019-08-29 ENCOUNTER — Inpatient Hospital Stay: Payer: Medicaid Other

## 2019-09-04 DIAGNOSIS — M4726 Other spondylosis with radiculopathy, lumbar region: Secondary | ICD-10-CM | POA: Diagnosis not present

## 2019-09-04 DIAGNOSIS — Z79891 Long term (current) use of opiate analgesic: Secondary | ICD-10-CM | POA: Diagnosis not present

## 2019-09-04 DIAGNOSIS — G894 Chronic pain syndrome: Secondary | ICD-10-CM | POA: Diagnosis not present

## 2019-09-04 DIAGNOSIS — M5136 Other intervertebral disc degeneration, lumbar region: Secondary | ICD-10-CM | POA: Diagnosis not present

## 2019-09-21 ENCOUNTER — Other Ambulatory Visit: Payer: Self-pay

## 2019-09-21 ENCOUNTER — Ambulatory Visit (INDEPENDENT_AMBULATORY_CARE_PROVIDER_SITE_OTHER): Payer: Medicaid Other | Admitting: Family Medicine

## 2019-09-21 DIAGNOSIS — Z09 Encounter for follow-up examination after completed treatment for conditions other than malignant neoplasm: Secondary | ICD-10-CM | POA: Diagnosis not present

## 2019-09-21 DIAGNOSIS — J302 Other seasonal allergic rhinitis: Secondary | ICD-10-CM

## 2019-09-21 DIAGNOSIS — F419 Anxiety disorder, unspecified: Secondary | ICD-10-CM

## 2019-09-21 DIAGNOSIS — K219 Gastro-esophageal reflux disease without esophagitis: Secondary | ICD-10-CM

## 2019-09-21 DIAGNOSIS — I10 Essential (primary) hypertension: Secondary | ICD-10-CM | POA: Diagnosis not present

## 2019-09-21 DIAGNOSIS — R350 Frequency of micturition: Secondary | ICD-10-CM | POA: Diagnosis not present

## 2019-09-21 MED ORDER — FLUTICASONE PROPIONATE 50 MCG/ACT NA SUSP: 2.0000 | Freq: Every day | NASAL | 6 refills | Status: DC

## 2019-09-21 MED ORDER — OXYBUTYNIN CHLORIDE ER 10 MG PO TB24: 10.0000 mg | ORAL_TABLET | Freq: Every day | ORAL | 6 refills | Status: DC

## 2019-09-21 NOTE — Progress Notes (Signed)
Virtual Visit via Telephone Note  I connected with Erin Bradford on [id number] at  3:20 PM EDT by telephone and verified that I am speaking with the correct person using two identifiers.   I discussed the limitations, risks, security and privacy concerns of performing an evaluation and management service by telephone and the availability of in person appointments. I also discussed with the patient that there may be a patient responsible charge related to this service. The patient expressed understanding and agreed to proceed.   History of Present Illness:  Past Medical History:  Diagnosis Date  . Abnormal laboratory test   . Acid reflux   . Allergy   . Anemia   . Anxiety   . Arthritis   . Blood transfusion without reported diagnosis   . BMI 39.0-39.9,adult   . Chronic hypokalemia   . Chronic kidney disease    mild  . Depression   . GERD (gastroesophageal reflux disease)   . Heart murmur   . Hypertension   . Insomnia   . Low back pain   . Obesity   . Occasional tremors   . Palpitations    dx with tachycardia   . PONV (postoperative nausea and vomiting)   . Tiredness   . Urinary frequency     Allergies  Allergen Reactions  . Ace Inhibitors Anaphylaxis and Swelling    Swelling of tongue.    Current Outpatient Medications on File Prior to Visit  Medication Sig Dispense Refill  . AMITIZA 24 MCG capsule     . buPROPion (WELLBUTRIN XL) 300 MG 24 hr tablet Take 1 tablet (300 mg total) by mouth daily. 90 tablet 0  . carvedilol (COREG) 12.5 MG tablet Take 1.5 tablets (18.75 mg total) by mouth 2 (two) times daily. 270 tablet 3  . cetirizine (ZYRTEC) 10 MG tablet TAKE 1 TABLET(10 MG) BY MOUTH AT BEDTIME AS NEEDED FOR ALLERGIES 30 tablet 1  . furosemide (LASIX) 20 MG tablet Take 20 mg by mouth 2 (two) times daily.     Marland Kitchen gabapentin (NEURONTIN) 300 MG capsule Take 300 mg by mouth 3 (three) times daily.     Marland Kitchen losartan (COZAAR) 100 MG tablet Take 100 mg by mouth daily.    .  methocarbamol (ROBAXIN) 750 MG tablet Take 750 mg by mouth 3 (three) times daily.  5  . oxyCODONE-acetaminophen (PERCOCET) 10-325 MG tablet TK 1 T PO Q 6 H PRF PAIN    . primidone (MYSOLINE) 50 MG tablet TAKE 6 TABLETS(300 MG) BY MOUTH AT BEDTIME 180 tablet 1  . sertraline (ZOLOFT) 100 MG tablet Take 1 tablet (100 mg total) by mouth daily. 90 tablet 0  . spironolactone (ALDACTONE) 50 MG tablet Take 50 mg by mouth 2 (two) times daily.    Marland Kitchen zolpidem (AMBIEN CR) 12.5 MG CR tablet Take 1 tablet (12.5 mg total) by mouth at bedtime as needed for sleep. 30 tablet 1  . ACCU-CHEK AVIVA PLUS test strip USE TO CHECK BLOOD SUGAR TWICE WEEKLY  11  . famotidine (PEPCID) 20 MG tablet Take 1 tablet (20 mg total) by mouth 2 (two) times daily. (Patient not taking: Reported on 09/21/2019) 60 tablet 3  . Fluocinolone Acetonide Body 0.01 % OIL Place 2 drops into both ears 2 (two) times daily as needed (for ear pain).     . Glucose Blood (BLOOD GLUCOSE TEST STRIPS) STRP 2 (two) times a week.    . Vitamin D, Ergocalciferol, (DRISDOL) 50000 units CAPS capsule Take 1  capsule (50,000 Units total) by mouth every 7 (seven) days. (Patient not taking: Reported on 09/21/2019) 5 capsule 3   No current facility-administered medications on file prior to visit.    Current Status: Since her last office visit, she is doing well with no complaints. She continues to follow up with Psychiatry as needed. She has upcoming appointment with Oncology on 09/28/2019. She continues to have acid reflux unrelieved by Pepcid, which she no longer takes. She denies fevers, chills, fatigue, recent infections, weight loss, and night sweats. She has not had any headaches, visual changes, dizziness, and falls. No chest pain, heart palpitations, cough and shortness of breath reported. No reports of GI problems such as nausea, vomiting, diarrhea, and constipation. She has no reports of blood in stools, dysuria and hematuria. No depression or anxiety, and denies  suicidal ideations, homicidal ideations, or auditory hallucinations. She denies pain today.   Observations/Objective:  Telephone Virtual Visit   Assessment and Plan:  1. Hypertension, unspecified type She will continue to decrease high sodium intake, excessive alcohol intake, increase potassium intake, smoking cessation, and increase physical activity of at least 30 minutes of cardio activity daily. She will continue to follow Heart Healthy or DASH diet. - Lipid Panel; Future - TSH; Future - Vitamin B12; Future - Vitamin D, 25-hydroxy; Future - Comprehensive metabolic panel; Future - CBC with Differential; Future  2. Gastroesophageal reflux disease without esophagitis Patient will report to office to testing for H. Pylori infection.  - H. pylori breath test; Future  3. Anxiety Stable.   4. Seasonal allergies - fluticasone (FLONASE) 50 MCG/ACT nasal spray; Place 2 sprays into both nostrils daily.  Dispense: 16 g; Refill: 6  5. Urinary frequency - oxybutynin (DITROPAN XL) 10 MG 24 hr tablet; Take 1 tablet (10 mg total) by mouth at bedtime.  Dispense: 30 tablet; Refill: 6  Meds ordered this encounter  Medications  . fluticasone (FLONASE) 50 MCG/ACT nasal spray    Sig: Place 2 sprays into both nostrils daily.    Dispense:  16 g    Refill:  6  . oxybutynin (DITROPAN XL) 10 MG 24 hr tablet    Sig: Take 1 tablet (10 mg total) by mouth at bedtime.    Dispense:  30 tablet    Refill:  6    Orders Placed This Encounter  Procedures  . Lipid Panel  . TSH  . Vitamin B12  . Vitamin D, 25-hydroxy  . Comprehensive metabolic panel  . CBC with Differential  . H. pylori breath test    Referral Orders  No referral(s) requested today    [name],  MSN, FNP-BC [facility] [address] [place], [place] [address] [phone] fax    Follow Up Instructions: She will follow up for labs  only in 1 week.  She will follow up for office visit in 1 months.    I discussed the assessment and treatment plan with the patient. The patient was provided an opportunity to ask questions and all were answered. The patient agreed with the plan and demonstrated an understanding of the instructions.   The patient was advised to call back or seek an in-person evaluation if the symptoms worsen or if the condition fails to improve as anticipated.  I provided 20 minutes of non-face-to-face time during this encounter.   Azzie Glatter, FNP

## 2019-09-27 ENCOUNTER — Encounter: Payer: Self-pay | Admitting: Neurology

## 2019-09-27 NOTE — Progress Notes (Signed)
Virtual Visit via Video Note The purpose of this virtual visit is to provide medical care while limiting exposure to the novel coronavirus.    Consent was obtained for video visit:  Yes.   Answered questions that patient had about telehealth interaction:  Yes.   I discussed the limitations, risks, security and privacy concerns of performing an evaluation and management service by telemedicine. I also discussed with the patient that there may be a patient responsible charge related to this service. The patient expressed understanding and agreed to proceed.  Pt location: Home Physician Location: office Name of referring provider:  Azzie Glatter, FNP I connected with Erin Bradford at patients initiation/request on 09/28/2019 at  2:30 PM EDT by video enabled telemedicine application and verified that I am speaking with the correct person using two identifiers. Pt MRN:  [id number] Pt DOB:  [birth year] Video Participants:  Erin Bradford;   History of Present Illness:  Erin Bradford is a 48 year old right-handed female with hypertension, iron deficiency anemia, prediabetes, mild chronic kidney disease, and anxiety who follows up for essential tremor.  UPDATE: She currently takes primidone 300mg  at bedtime.   She thinks that the tremors have gotten a little worse  She reports worsening memory.  She reports increased word finding difficulty and losing her thoughts when she wants to say something.  She denies forgetting conversations, repeating questions, paying bills, or disorientation driving on familiar routes.    HISTORY: Beginning in 2017, she reports tremor in both hands. She only notices it when she is performing a task, but not at rest. 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 She also reports difficulty pouring liquids in a spoon or using utensils. It does not affect writing. She denies any triggers or anything that improves symptoms. She  thinks it has gotten a little worse over the past year. She denies starting any new medication prior to onset of symptoms. She denies family history of tremor. TSH was 0.92.   She endorses short term memory problems. She frequently is experiencing word-finding difficulties. This has been ongoing for about a year but has gotten worse over the past 3 months. Other than the primidone, she has not had any changes or new medications. She has not had trouble with finances or disorientation on familiar routes. To evaluate memory deficits, B12 and TSH were checked, which were 663 and 1.05 respectively.It was determined that her memory problems were related to depression.  However, she subsequently felt it has gotten worse.  She reports that her mother has some memory issues but has not been diagnosed with dementia.  She reports that her grandmother developed some dementia at age 1.  She reports depression, anxiety and difficulty sleeping.  She was previously on topiramate for anxiety and overeating, but her psychiatrist stopped her.    Past Medical History: Past Medical History:  Diagnosis Date  . Abnormal laboratory test   . Acid reflux   . Allergy   . Anemia   . Anxiety   . Arthritis   . Blood transfusion without reported diagnosis   . BMI 39.0-39.9,adult   . Chronic hypokalemia   . Chronic kidney disease    mild  . Depression   . GERD (gastroesophageal reflux disease)   . Heart murmur   . Hypertension   . Insomnia   . Low back pain   . Obesity   . Occasional tremors   . Palpitations    dx with tachycardia   .  PONV (postoperative nausea and vomiting)   . Tiredness   . Urinary frequency     Medications: Outpatient Encounter Medications as of 09/28/2019  Medication Sig Note  . ACCU-CHEK AVIVA PLUS test strip USE TO CHECK BLOOD SUGAR TWICE WEEKLY   . AMITIZA 24 MCG capsule    . buPROPion (WELLBUTRIN XL) 300 MG 24 hr tablet Take 1 tablet (300 mg total) by mouth daily.   .  carvedilol (COREG) 12.5 MG tablet Take 1.5 tablets (18.75 mg total) by mouth 2 (two) times daily.   . cetirizine (ZYRTEC) 10 MG tablet TAKE 1 TABLET(10 MG) BY MOUTH AT BEDTIME AS NEEDED FOR ALLERGIES   . famotidine (PEPCID) 20 MG tablet Take 1 tablet (20 mg total) by mouth 2 (two) times daily. (Patient not taking: Reported on 09/21/2019)   . Fluocinolone Acetonide Body 0.01 % OIL Place 2 drops into both ears 2 (two) times daily as needed (for ear pain).    . fluticasone (FLONASE) 50 MCG/ACT nasal spray Place 2 sprays into both nostrils daily.   . furosemide (LASIX) 20 MG tablet Take 20 mg by mouth 2 (two) times daily.    Marland Kitchen gabapentin (NEURONTIN) 300 MG capsule Take 300 mg by mouth 3 (three) times daily.    . Glucose Blood (BLOOD GLUCOSE TEST STRIPS) STRP 2 (two) times a week.   . losartan (COZAAR) 100 MG tablet Take 100 mg by mouth daily.   . methocarbamol (ROBAXIN) 750 MG tablet Take 750 mg by mouth 3 (three) times daily.   Marland Kitchen oxybutynin (DITROPAN XL) 10 MG 24 hr tablet Take 1 tablet (10 mg total) by mouth at bedtime.   Marland Kitchen oxyCODONE-acetaminophen (PERCOCET) 10-325 MG tablet TK 1 T PO Q 6 H PRF PAIN   . primidone (MYSOLINE) 50 MG tablet TAKE 6 TABLETS(300 MG) BY MOUTH AT BEDTIME   . sertraline (ZOLOFT) 100 MG tablet Take 1 tablet (100 mg total) by mouth daily.   Marland Kitchen spironolactone (ALDACTONE) 50 MG tablet Take 50 mg by mouth 2 (two) times daily.   . Vitamin D, Ergocalciferol, (DRISDOL) 50000 units CAPS capsule Take 1 capsule (50,000 Units total) by mouth every 7 (seven) days. (Patient not taking: Reported on 09/21/2019)   . zolpidem (AMBIEN CR) 12.5 MG CR tablet Take 1 tablet (12.5 mg total) by mouth at bedtime as needed for sleep. 09/21/2019: As needed.    No facility-administered encounter medications on file as of 09/28/2019.     Allergies: Allergies  Allergen Reactions  . Ace Inhibitors Anaphylaxis and Swelling    Swelling of tongue.    Family History: Family History  Problem Relation Age  of Onset  . Diabetes Mother   . Hypertension Mother   . Hyperlipidemia Mother   . Sarcoidosis Mother   . Kidney disease Father   . Atrial fibrillation Sister   . Kidney disease Maternal Grandmother   . Pancreatic cancer Maternal Grandmother   . Diabetes Maternal Grandmother   . Hypertension Maternal Grandmother   . Cancer Maternal Grandfather   . Colon cancer Neg Hx   . Colon polyps Neg Hx   . Esophageal cancer Neg Hx   . Stomach cancer Neg Hx   . Rectal cancer Neg Hx     Social History: Social History   Socioeconomic History  . Marital status: Widowed    Spouse name: Not on file  . Number of children: Not on file  . Years of education: Not on file  . Highest education level: Not on file  Occupational History  . Not on file  Social Needs  . Financial resource strain: Not on file  . Food insecurity    Worry: Not on file    Inability: Not on file  . Transportation needs    Medical: Not on file    Non-medical: Not on file  Tobacco Use  . Smoking status: Never Smoker  . Smokeless tobacco: Never Used  Substance and Sexual Activity  . Alcohol use: No  . Drug use: No  . Sexual activity: Never    Birth control/protection: Condom  Lifestyle  . Physical activity    Days per week: Not on file    Minutes per session: Not on file  . Stress: Not on file  Relationships  . Social Herbalist on phone: Not on file    Gets together: Not on file    Attends religious service: Not on file    Active member of club or organization: Not on file    Attends meetings of clubs or organizations: Not on file    Relationship status: Not on file  . Intimate partner violence    Fear of current or ex partner: Not on file    Emotionally abused: Not on file    Physically abused: Not on file    Forced sexual activity: Not on file  Other Topics Concern  . Not on file  Social History Narrative  . Not on file    Observations/Objective:   Height 5' 6.5" (1.689 m), weight 230 lb  (104.3 kg), last menstrual period 09/17/2017. No acute distress.  Alert and oriented.  Speech fluent and not dysarthric.  Language intact.  Eyes orthophoric on primary gaze.  Face symmetric.  Assessment and Plan:   1.  Essential tremor  1.  increase primidone to 50mg  in AM and 300mg  at bedtime.  We can increase dose in 4 weeks if needed. 2.  Follow up in 6 months.  Follow Up Instructions:    -I discussed the assessment and treatment plan with the patient. The patient was provided an opportunity to ask questions and all were answered. The patient agreed with the plan and demonstrated an understanding of the instructions.   The patient was advised to call back or seek an in-person evaluation if the symptoms worsen or if the condition fails to improve as anticipated.    Total Time spent in visit with the patient was:  15 minutes  Dudley Major, DO

## 2019-09-28 ENCOUNTER — Telehealth (INDEPENDENT_AMBULATORY_CARE_PROVIDER_SITE_OTHER): Payer: Medicaid Other | Admitting: Neurology

## 2019-09-28 ENCOUNTER — Other Ambulatory Visit: Payer: Self-pay

## 2019-09-28 ENCOUNTER — Encounter: Payer: Self-pay | Admitting: Neurology

## 2019-09-28 VITALS — Ht 66.5 in | Wt 230.0 lb

## 2019-09-28 DIAGNOSIS — G250 Essential tremor: Secondary | ICD-10-CM | POA: Diagnosis not present

## 2019-09-28 MED ORDER — PRIMIDONE 50 MG PO TABS: ORAL_TABLET | ORAL | 5 refills | Status: DC

## 2019-10-03 DIAGNOSIS — K5903 Drug induced constipation: Secondary | ICD-10-CM | POA: Diagnosis not present

## 2019-10-03 DIAGNOSIS — M47816 Spondylosis without myelopathy or radiculopathy, lumbar region: Secondary | ICD-10-CM | POA: Diagnosis not present

## 2019-10-03 DIAGNOSIS — G894 Chronic pain syndrome: Secondary | ICD-10-CM | POA: Diagnosis not present

## 2019-10-03 DIAGNOSIS — M48061 Spinal stenosis, lumbar region without neurogenic claudication: Secondary | ICD-10-CM | POA: Diagnosis not present

## 2019-10-03 DIAGNOSIS — M5136 Other intervertebral disc degeneration, lumbar region: Secondary | ICD-10-CM | POA: Diagnosis not present

## 2019-10-03 DIAGNOSIS — M5416 Radiculopathy, lumbar region: Secondary | ICD-10-CM | POA: Diagnosis not present

## 2019-10-03 DIAGNOSIS — Z79891 Long term (current) use of opiate analgesic: Secondary | ICD-10-CM | POA: Diagnosis not present

## 2019-10-03 DIAGNOSIS — T402X5A Adverse effect of other opioids, initial encounter: Secondary | ICD-10-CM | POA: Diagnosis not present

## 2019-10-04 ENCOUNTER — Other Ambulatory Visit: Payer: Self-pay

## 2019-10-04 DIAGNOSIS — D500 Iron deficiency anemia secondary to blood loss (chronic): Secondary | ICD-10-CM

## 2019-10-05 ENCOUNTER — Telehealth: Payer: Self-pay | Admitting: Oncology

## 2019-10-05 ENCOUNTER — Inpatient Hospital Stay: Payer: Medicaid Other

## 2019-10-05 ENCOUNTER — Inpatient Hospital Stay: Payer: Medicaid Other | Admitting: Oncology

## 2019-10-05 NOTE — Telephone Encounter (Signed)
Returned patient's phone call regarding cancelling an appointment, per request appointment has been cancelled.

## 2019-10-08 ENCOUNTER — Other Ambulatory Visit: Payer: Self-pay

## 2019-10-09 ENCOUNTER — Ambulatory Visit: Payer: Medicaid Other | Admitting: Cardiology

## 2019-10-09 NOTE — Progress Notes (Deleted)
Electrophysiology Office Note   Date:  0000000   ID:  Erin Bradford, DOB [birth year], MRN [id number]  PCP:  Azzie Glatter, FNP Primary Electrophysiologist:  Constance Haw, MD    No chief complaint on file.    History of Present Illness: Erin Bradford is a 48 y.o. female who presents today for electrophysiology evaluation.   She has a history of hypertension, obesity, palpitations, and chronic hypokalemia. She had an electrocardiogram at her primary physician's office that showed sinus rhythm with inferior T-wave inversions.   Today, denies symptoms of palpitations, chest pain, shortness of breath, orthopnea, PND, lower extremity edema, claudication, dizziness, presyncope, syncope, bleeding, or neurologic sequela. The patient is tolerating medications without difficulties. ***    Past Medical History:  Diagnosis Date  . Abnormal laboratory test   . Acid reflux   . Allergy   . Anemia   . Anxiety   . Arthritis   . Blood transfusion without reported diagnosis   . BMI 39.0-39.9,adult   . Chronic hypokalemia   . Chronic kidney disease    mild  . Depression   . GERD (gastroesophageal reflux disease)   . Heart murmur   . Hypertension   . Insomnia   . Low back pain   . Obesity   . Occasional tremors   . Palpitations    dx with tachycardia   . PONV (postoperative nausea and vomiting)   . Tiredness   . Urinary frequency    Past Surgical History:  Procedure Laterality Date  . CESAREAN SECTION    . TOOTH EXTRACTION    . TOTAL LAPAROSCOPIC HYSTERECTOMY WITH SALPINGECTOMY Bilateral 10/06/2017   Procedure: ATTEMPTED HYSTERECTOMY TOTAL LAPAROSCOPIC WITH SALPINGECTOMY CONVERTED TO OPEN ABDOMINAL TOTAL HYSTERECTOMY WITH SALPINGECTOMY;  Surgeon: Sherlyn Hay, DO;  Location: [facility];  Service: Gynecology;  Laterality: Bilateral;     Current Outpatient Medications  Medication Sig Dispense Refill  . ACCU-CHEK AVIVA PLUS test strip  USE TO CHECK BLOOD SUGAR TWICE WEEKLY  11  . AMITIZA 24 MCG capsule     . buPROPion (WELLBUTRIN XL) 300 MG 24 hr tablet Take 1 tablet (300 mg total) by mouth daily. 90 tablet 0  . carvedilol (COREG) 12.5 MG tablet Take 1.5 tablets (18.75 mg total) by mouth 2 (two) times daily. 270 tablet 3  . cetirizine (ZYRTEC) 10 MG tablet TAKE 1 TABLET(10 MG) BY MOUTH AT BEDTIME AS NEEDED FOR ALLERGIES 30 tablet 1  . famotidine (PEPCID) 20 MG tablet Take 1 tablet (20 mg total) by mouth 2 (two) times daily. (Patient not taking: Reported on 09/21/2019) 60 tablet 3  . Fluocinolone Acetonide Body 0.01 % OIL Place 2 drops into both ears 2 (two) times daily as needed (for ear pain).     . fluticasone (FLONASE) 50 MCG/ACT nasal spray Place 2 sprays into both nostrils daily. 16 g 6  . furosemide (LASIX) 20 MG tablet Take 20 mg by mouth 2 (two) times daily.     Marland Kitchen gabapentin (NEURONTIN) 300 MG capsule Take 300 mg by mouth 3 (three) times daily.     . Glucose Blood (BLOOD GLUCOSE TEST STRIPS) STRP 2 (two) times a week.    . losartan (COZAAR) 100 MG tablet Take 100 mg by mouth daily.    . methocarbamol (ROBAXIN) 750 MG tablet Take 750 mg by mouth 3 (three) times daily.  5  . oxybutynin (DITROPAN XL) 10 MG 24 hr tablet Take 1 tablet (10 mg total) by  mouth at bedtime. 30 tablet 6  . oxyCODONE-acetaminophen (PERCOCET) 10-325 MG tablet TK 1 T PO Q 6 H PRF PAIN    . primidone (MYSOLINE) 50 MG tablet Take 1 tablet in AM and 6 tablets at bedtime 210 tablet 5  . sertraline (ZOLOFT) 100 MG tablet Take 1 tablet (100 mg total) by mouth daily. 90 tablet 0  . spironolactone (ALDACTONE) 50 MG tablet Take 50 mg by mouth 2 (two) times daily.    . Vitamin D, Ergocalciferol, (DRISDOL) 50000 units CAPS capsule Take 1 capsule (50,000 Units total) by mouth every 7 (seven) days. (Patient not taking: Reported on 09/21/2019) 5 capsule 3  . zolpidem (AMBIEN CR) 12.5 MG CR tablet Take 1 tablet (12.5 mg total) by mouth at bedtime as needed for sleep.  30 tablet 1   No current facility-administered medications for this visit.     Allergies:   Ace inhibitors   Social History:  The patient  reports that she has never smoked. She has never used smokeless tobacco. She reports that she does not drink alcohol or use drugs.   Family History:  The patient's family history includes Atrial fibrillation in her sister; Cancer in her maternal grandfather; Diabetes in her maternal grandmother and mother; Hyperlipidemia in her mother; Hypertension in her maternal grandmother and mother; Kidney disease in her father and maternal grandmother; Pancreatic cancer in her maternal grandmother; Sarcoidosis in her mother.   ROS:  Please see the history of present illness.   Otherwise, review of systems is positive for ***.   All other systems are reviewed and negative.   PHYSICAL EXAM: VS:  LMP 09/17/2017 (Approximate)  , BMI There is no height or weight on file to calculate BMI. GEN: Well nourished, well developed, in no acute distress  HEENT: normal  Neck: no JVD, carotid bruits, or masses Cardiac: ***RRR; no murmurs, rubs, or gallops,no edema  Respiratory:  clear to auscultation bilaterally, normal work of breathing GI: soft, nontender, nondistended, + BS MS: no deformity or atrophy  Skin: warm and dry Neuro:  Strength and sensation are intact Psych: euthymic mood, full affect  EKG:  EKG {ACTION; IS/IS [id number] ordered today. Personal review of the ekg ordered *** shows ***  Recent Labs: 02/06/2019: Hemoglobin 13.6; Platelet Count 346    Lipid Panel     Component Value Date/Time   CHOL 183 07/05/2018 1401   TRIG 156 (H) 07/05/2018 1401   HDL 38 (L) 07/05/2018 1401   CHOLHDL 4.8 (H) 07/05/2018 1401   LDLCALC 114 (H) 07/05/2018 1401     Wt Readings from Last 3 Encounters:  09/27/19 230 lb (104.3 kg)  03/28/19 235 lb (106.6 kg)  03/23/19 235 lb (106.6 kg)      Other studies Reviewed: Additional studies/ records that were reviewed  today include: Cardiac monitor 12/14/17 Sinus rhythm and sinus tachycardia All symptoms associated with sinus rhythm and sinus tachycardia No arrhythmias seen Zero atrial fibrillation   ASSESSMENT AND PLAN:  1.  Palpitations: ***Early on carvedilol.  She is tolerating the medication well.  She is having up to 30 seconds of palpitations, rarely.  She is not concerned.  No changes.  2. Hypertension: ***  3. Snoring: ***Planning a sleep study, but she has been too busy and unable to schedule.  She also had a hysterectomy which was complicated by infection.  She wishes to have a repeat sleep study.  We Lillyth Spong put the order in today.  Current medicines are reviewed at length with  the patient today.   The patient does not have concerns regarding her medicines.  The following changes were made today: ***  Labs/ tests ordered today include:  No orders of the defined types were placed in this encounter.    Disposition:   FU with Donavyn Fecher *** year  Signed, Christyna Letendre Meredith Leeds, MD  10/09/2019 7:33 AM     [facility] [address] [place] [address] [phone] (office) [phone] (fax).inic

## 2019-10-15 ENCOUNTER — Ambulatory Visit: Payer: Medicaid Other | Admitting: Cardiology

## 2019-10-15 NOTE — Progress Notes (Deleted)
Electrophysiology Office Note   Date:  [id number]   ID:  Erin Bradford, DOB 1971-02-25, MRN [id number]  PCP:  Azzie Glatter, FNP Primary Electrophysiologist:  Constance Haw, MD    No chief complaint on file.    History of Present Illness: Erin Bradford is a 48 y.o. female who presents today for electrophysiology evaluation.   She has a history of hypertension, obesity, palpitations, and chronic hypokalemia. She had an electrocardiogram at her primary physician's office that showed sinus rhythm with inferior T-wave inversions.   Today, denies symptoms of palpitations, chest pain, shortness of breath, orthopnea, PND, lower extremity edema, claudication, dizziness, presyncope, syncope, bleeding, or neurologic sequela. The patient is tolerating medications without difficulties. ***    Past Medical History:  Diagnosis Date  . Abnormal laboratory test   . Acid reflux   . Allergy   . Anemia   . Anxiety   . Arthritis   . Blood transfusion without reported diagnosis   . BMI 39.0-39.9,adult   . Chronic hypokalemia   . Chronic kidney disease    mild  . Depression   . GERD (gastroesophageal reflux disease)   . Heart murmur   . Hypertension   . Insomnia   . Low back pain   . Obesity   . Occasional tremors   . Palpitations    dx with tachycardia   . PONV (postoperative nausea and vomiting)   . Tiredness   . Urinary frequency    Past Surgical History:  Procedure Laterality Date  . CESAREAN SECTION    . TOOTH EXTRACTION    . TOTAL LAPAROSCOPIC HYSTERECTOMY WITH SALPINGECTOMY Bilateral 10/06/2017   Procedure: ATTEMPTED HYSTERECTOMY TOTAL LAPAROSCOPIC WITH SALPINGECTOMY CONVERTED TO OPEN ABDOMINAL TOTAL HYSTERECTOMY WITH SALPINGECTOMY;  Surgeon: Sherlyn Hay, DO;  Location: [facility];  Service: Gynecology;  Laterality: Bilateral;     Current Outpatient Medications  Medication Sig Dispense Refill  . ACCU-CHEK AVIVA PLUS test strip  USE TO CHECK BLOOD SUGAR TWICE WEEKLY  11  . AMITIZA 24 MCG capsule     . buPROPion (WELLBUTRIN XL) 300 MG 24 hr tablet Take 1 tablet (300 mg total) by mouth daily. 90 tablet 0  . carvedilol (COREG) 12.5 MG tablet Take 1.5 tablets (18.75 mg total) by mouth 2 (two) times daily. 270 tablet 3  . cetirizine (ZYRTEC) 10 MG tablet TAKE 1 TABLET(10 MG) BY MOUTH AT BEDTIME AS NEEDED FOR ALLERGIES 30 tablet 1  . famotidine (PEPCID) 20 MG tablet Take 1 tablet (20 mg total) by mouth 2 (two) times daily. (Patient not taking: Reported on 09/21/2019) 60 tablet 3  . Fluocinolone Acetonide Body 0.01 % OIL Place 2 drops into both ears 2 (two) times daily as needed (for ear pain).     . fluticasone (FLONASE) 50 MCG/ACT nasal spray Place 2 sprays into both nostrils daily. 16 g 6  . furosemide (LASIX) 20 MG tablet Take 20 mg by mouth 2 (two) times daily.     Marland Kitchen gabapentin (NEURONTIN) 300 MG capsule Take 300 mg by mouth 3 (three) times daily.     . Glucose Blood (BLOOD GLUCOSE TEST STRIPS) STRP 2 (two) times a week.    . losartan (COZAAR) 100 MG tablet Take 100 mg by mouth daily.    . methocarbamol (ROBAXIN) 750 MG tablet Take 750 mg by mouth 3 (three) times daily.  5  . oxybutynin (DITROPAN XL) 10 MG 24 hr tablet Take 1 tablet (10 mg total) by  mouth at bedtime. 30 tablet 6  . oxyCODONE-acetaminophen (PERCOCET) 10-325 MG tablet TK 1 T PO Q 6 H PRF PAIN    . primidone (MYSOLINE) 50 MG tablet Take 1 tablet in AM and 6 tablets at bedtime 210 tablet 5  . sertraline (ZOLOFT) 100 MG tablet Take 1 tablet (100 mg total) by mouth daily. 90 tablet 0  . spironolactone (ALDACTONE) 50 MG tablet Take 50 mg by mouth 2 (two) times daily.    . Vitamin D, Ergocalciferol, (DRISDOL) 50000 units CAPS capsule Take 1 capsule (50,000 Units total) by mouth every 7 (seven) days. (Patient not taking: Reported on 09/21/2019) 5 capsule 3  . zolpidem (AMBIEN CR) 12.5 MG CR tablet Take 1 tablet (12.5 mg total) by mouth at bedtime as needed for sleep.  30 tablet 1   No current facility-administered medications for this visit.     Allergies:   Ace inhibitors   Social History:  The patient  reports that she has never smoked. She has never used smokeless tobacco. She reports that she does not drink alcohol or use drugs.   Family History:  The patient's family history includes Atrial fibrillation in her sister; Cancer in her maternal grandfather; Diabetes in her maternal grandmother and mother; Hyperlipidemia in her mother; Hypertension in her maternal grandmother and mother; Kidney disease in her father and maternal grandmother; Pancreatic cancer in her maternal grandmother; Sarcoidosis in her mother.   ROS:  Please see the history of present illness.   Otherwise, review of systems is positive for ***.   All other systems are reviewed and negative.   PHYSICAL EXAM: VS:  LMP 09/17/2017 (Approximate)  , BMI There is no height or weight on file to calculate BMI. GEN: Well nourished, well developed, in no acute distress  HEENT: normal  Neck: no JVD, carotid bruits, or masses Cardiac: ***RRR; no murmurs, rubs, or gallops,no edema  Respiratory:  clear to auscultation bilaterally, normal work of breathing GI: soft, nontender, nondistended, + BS MS: no deformity or atrophy  Skin: warm and dry Neuro:  Strength and sensation are intact Psych: euthymic mood, full affect  EKG:  EKG {ACTION; IS/IS [id number] ordered today. Personal review of the ekg ordered *** shows ***  Recent Labs: 02/06/2019: Hemoglobin 13.6; Platelet Count 346    Lipid Panel     Component Value Date/Time   CHOL 183 07/05/2018 1401   TRIG 156 (H) 07/05/2018 1401   HDL 38 (L) 07/05/2018 1401   CHOLHDL 4.8 (H) 07/05/2018 1401   LDLCALC 114 (H) 07/05/2018 1401     Wt Readings from Last 3 Encounters:  09/27/19 230 lb (104.3 kg)  03/28/19 235 lb (106.6 kg)  03/23/19 235 lb (106.6 kg)      Other studies Reviewed: Additional studies/ records that were reviewed  today include: Cardiac monitor 12/14/17 Sinus rhythm and sinus tachycardia All symptoms associated with sinus rhythm and sinus tachycardia No arrhythmias seen Zero atrial fibrillation   ASSESSMENT AND PLAN:  1.  Palpitations: ***Early on carvedilol.  She is tolerating the medication well.  She is having up to 30 seconds of palpitations, rarely.  She is not concerned.  No changes.  2. Hypertension: ***  3. Snoring: ***Planning a sleep study, but she has been too busy and unable to schedule.  She also had a hysterectomy which was complicated by infection.  She wishes to have a repeat sleep study.  We Jaiden Dinkins put the order in today.  Current medicines are reviewed at length with  the patient today.   The patient does not have concerns regarding her medicines.  The following changes were made today: ***  Labs/ tests ordered today include:  No orders of the defined types were placed in this encounter.    Disposition:   FU with Cinthia Rodden *** year  Signed, Quindell Shere Meredith Leeds, MD  10/15/2019 8:34 AM     [facility] [address] [place] [address] [phone] (office) [phone] (fax).inic

## 2019-10-21 ENCOUNTER — Other Ambulatory Visit: Payer: Self-pay | Admitting: Family Medicine

## 2019-10-21 DIAGNOSIS — J302 Other seasonal allergic rhinitis: Secondary | ICD-10-CM

## 2019-10-21 NOTE — Progress Notes (Deleted)
Cardiology Office Note Date:  10/21/2019  Patient ID:  Erin Bradford 19, 1972, MRN [id number] PCP:  Azzie Glatter, FNP  Electrophysiologist:  Dr. Curt Bears  ***refresh   Chief Complaint: *** 6 mo  History of Present Illness: Erin Bradford is a 48 y.o. female with history of HTN, obesity, chronic hypokalemia.  re wShe comes in today to be seen for Dr. Curt Bears.  Last seen by him via tele-medicine march 2020.  She had c/o significant fatigue, PMD had recently done labs noting Low Vt D and started on replacement, though no cause for her fatigue. Her coreg was changed to Toprol XL at HS  *** sleep study ever done back in 2019? *** fatigue better on toprol? *** meds,  *** palps OK on toprol?   Past Medical History:  Diagnosis Date  . Abnormal laboratory test   . Acid reflux   . Allergy   . Anemia   . Anxiety   . Arthritis   . Blood transfusion without reported diagnosis   . BMI 39.0-39.9,adult   . Chronic hypokalemia   . Chronic kidney disease    mild  . Depression   . GERD (gastroesophageal reflux disease)   . Heart murmur   . Hypertension   . Insomnia   . Low back pain   . Obesity   . Occasional tremors   . Palpitations    dx with tachycardia   . PONV (postoperative nausea and vomiting)   . Tiredness   . Urinary frequency     Past Surgical History:  Procedure Laterality Date  . CESAREAN SECTION    . TOOTH EXTRACTION    . TOTAL LAPAROSCOPIC HYSTERECTOMY WITH SALPINGECTOMY Bilateral 10/06/2017   Procedure: ATTEMPTED HYSTERECTOMY TOTAL LAPAROSCOPIC WITH SALPINGECTOMY CONVERTED TO OPEN ABDOMINAL TOTAL HYSTERECTOMY WITH SALPINGECTOMY;  Surgeon: Sherlyn Hay, DO;  Location: [facility];  Service: Gynecology;  Laterality: Bilateral;    Current Outpatient Medications  Medication Sig Dispense Refill  . ACCU-CHEK AVIVA PLUS test strip USE TO CHECK BLOOD SUGAR TWICE WEEKLY  11  . AMITIZA 24 MCG capsule     . buPROPion  (WELLBUTRIN XL) 300 MG 24 hr tablet Take 1 tablet (300 mg total) by mouth daily. 90 tablet 0  . carvedilol (COREG) 12.5 MG tablet Take 1.5 tablets (18.75 mg total) by mouth 2 (two) times daily. 270 tablet 3  . cetirizine (ZYRTEC) 10 MG tablet TAKE 1 TABLET(10 MG) BY MOUTH AT BEDTIME AS NEEDED FOR ALLERGIES 30 tablet 1  . famotidine (PEPCID) 20 MG tablet Take 1 tablet (20 mg total) by mouth 2 (two) times daily. (Patient not taking: Reported on 09/21/2019) 60 tablet 3  . Fluocinolone Acetonide Body 0.01 % OIL Place 2 drops into both ears 2 (two) times daily as needed (for ear pain).     . fluticasone (FLONASE) 50 MCG/ACT nasal spray Place 2 sprays into both nostrils daily. 16 g 6  . furosemide (LASIX) 20 MG tablet Take 20 mg by mouth 2 (two) times daily.     Marland Kitchen gabapentin (NEURONTIN) 300 MG capsule Take 300 mg by mouth 3 (three) times daily.     . Glucose Blood (BLOOD GLUCOSE TEST STRIPS) STRP 2 (two) times a week.    . losartan (COZAAR) 100 MG tablet Take 100 mg by mouth daily.    . methocarbamol (ROBAXIN) 750 MG tablet Take 750 mg by mouth 3 (three) times daily.  5  . oxybutynin (DITROPAN XL) 10 MG 24 hr  tablet Take 1 tablet (10 mg total) by mouth at bedtime. 30 tablet 6  . oxyCODONE-acetaminophen (PERCOCET) 10-325 MG tablet TK 1 T PO Q 6 H PRF PAIN    . primidone (MYSOLINE) 50 MG tablet Take 1 tablet in AM and 6 tablets at bedtime 210 tablet 5  . sertraline (ZOLOFT) 100 MG tablet Take 1 tablet (100 mg total) by mouth daily. 90 tablet 0  . spironolactone (ALDACTONE) 50 MG tablet Take 50 mg by mouth 2 (two) times daily.    . Vitamin D, Ergocalciferol, (DRISDOL) 50000 units CAPS capsule Take 1 capsule (50,000 Units total) by mouth every 7 (seven) days. (Patient not taking: Reported on 09/21/2019) 5 capsule 3  . zolpidem (AMBIEN CR) 12.5 MG CR tablet Take 1 tablet (12.5 mg total) by mouth at bedtime as needed for sleep. 30 tablet 1   No current facility-administered medications for this visit.      Allergies:   Ace inhibitors   Social History:  The patient  reports that she has never smoked. She has never used smokeless tobacco. She reports that she does not drink alcohol or use drugs.   Family History:  The patient's family history includes Atrial fibrillation in her sister; Cancer in her maternal grandfather; Diabetes in her maternal grandmother and mother; Hyperlipidemia in her mother; Hypertension in her maternal grandmother and mother; Kidney disease in her father and maternal grandmother; Pancreatic cancer in her maternal grandmother; Sarcoidosis in her mother.  ROS:  Please see the history of present illness.  All other systems are reviewed and otherwise negative.   PHYSICAL EXAM: *** VS:  LMP 09/17/2017 (Approximate)  BMI: There is no height or weight on file to calculate BMI. Well nourished, well developed, in no acute distress  HEENT: normocephalic, atraumatic  Neck: no JVD, carotid bruits or masses Cardiac:  *** RRR; no significant murmurs, no rubs, or gallops Lungs:  *** CTA b/l, no wheezing, rhonchi or rales  Abd: soft, nontender MS: no deformity or *** atrophy Ext: *** no edema  Skin: warm and dry, no rash Neuro:  No gross deficits appreciated Psych: euthymic mood, full affect    EKG:  Done today and reviewed by myself shows ***  11/2016: 2 week monitor Sinus rhythm and sinus tachycardia All symptoms associated with sinus rhythm and sinus tachycardia No arrhythmias seen Zero atrial fibrillation  Recent Labs: 02/06/2019: Hemoglobin 13.6; Platelet Count 346  No results found for requested labs within last 8760 hours.   CrCl cannot be calculated (Patient's most recent lab result is older than the maximum 21 days allowed.).   Wt Readings from Last 3 Encounters:  09/27/19 230 lb (104.3 kg)  03/28/19 235 lb (106.6 kg)  03/23/19 235 lb (106.6 kg)     Other studies reviewed: Additional studies/records reviewed today include: summarized above  ASSESSMENT AND  PLAN:  1. H/o palpitations     Associated with SR/ST     *** Better with BB  2. HTN     ***    Disposition: F/u with ***  Current medicines are reviewed at length with the patient today.  The patient did not have any concerns regarding medicines.***  Signed, Tommye Standard, PA-C 10/21/2019 8:29 PM     [place] [address] [phone] (office)  [phone] (fax)

## 2019-10-24 ENCOUNTER — Ambulatory Visit: Payer: Medicaid Other | Admitting: Physician Assistant

## 2019-10-30 NOTE — Progress Notes (Deleted)
Cardiology Office Note Date:  10/30/2019  Patient ID:  Erin Bradford, Erin Bradford 1971-06-06, MRN [id number] PCP:  Azzie Glatter, FNP  Electrophysiologist:  Dr. Curt Bears  ***refresh   Chief Complaint: *** 6 mo  History of Present Illness: Erin Bradford is a 48 y.o. female with history of HTN, obesity, chronic hypokalemia.  re wShe comes in today to be seen for Dr. Curt Bears.  Last seen by him via tele-medicine march 2020.  She had c/o significant fatigue, PMD had recently done labs noting Low Vt D and started on replacement, though no cause for her fatigue. Her coreg was changed to Toprol XL at HS  *** sleep study ever done back in 2019? *** fatigue better on toprol? *** meds,  *** palps OK on toprol?   Past Medical History:  Diagnosis Date  . Abnormal laboratory test   . Acid reflux   . Allergy   . Anemia   . Anxiety   . Arthritis   . Blood transfusion without reported diagnosis   . BMI 39.0-39.9,adult   . Chronic hypokalemia   . Chronic kidney disease    mild  . Depression   . GERD (gastroesophageal reflux disease)   . Heart murmur   . Hypertension   . Insomnia   . Low back pain   . Obesity   . Occasional tremors   . Palpitations    dx with tachycardia   . PONV (postoperative nausea and vomiting)   . Tiredness   . Urinary frequency     Past Surgical History:  Procedure Laterality Date  . CESAREAN SECTION    . TOOTH EXTRACTION    . TOTAL LAPAROSCOPIC HYSTERECTOMY WITH SALPINGECTOMY Bilateral 10/06/2017   Procedure: ATTEMPTED HYSTERECTOMY TOTAL LAPAROSCOPIC WITH SALPINGECTOMY CONVERTED TO OPEN ABDOMINAL TOTAL HYSTERECTOMY WITH SALPINGECTOMY;  Surgeon: Sherlyn Hay, DO;  Location: [facility];  Service: Gynecology;  Laterality: Bilateral;    Current Outpatient Medications  Medication Sig Dispense Refill  . ACCU-CHEK AVIVA PLUS test strip USE TO CHECK BLOOD SUGAR TWICE WEEKLY  11  . AMITIZA 24 MCG capsule     . buPROPion  (WELLBUTRIN XL) 300 MG 24 hr tablet Take 1 tablet (300 mg total) by mouth daily. 90 tablet 0  . carvedilol (COREG) 12.5 MG tablet Take 1.5 tablets (18.75 mg total) by mouth 2 (two) times daily. 270 tablet 3  . cetirizine (ZYRTEC) 10 MG tablet TAKE 1 TABLET(10 MG) BY MOUTH AT BEDTIME AS NEEDED FOR ALLERGIES 30 tablet 1  . famotidine (PEPCID) 20 MG tablet Take 1 tablet (20 mg total) by mouth 2 (two) times daily. (Patient not taking: Reported on 09/21/2019) 60 tablet 3  . Fluocinolone Acetonide Body 0.01 % OIL Place 2 drops into both ears 2 (two) times daily as needed (for ear pain).     . fluticasone (FLONASE) 50 MCG/ACT nasal spray Place 2 sprays into both nostrils daily. 16 g 6  . furosemide (LASIX) 20 MG tablet Take 20 mg by mouth 2 (two) times daily.     Marland Kitchen gabapentin (NEURONTIN) 300 MG capsule Take 300 mg by mouth 3 (three) times daily.     . Glucose Blood (BLOOD GLUCOSE TEST STRIPS) STRP 2 (two) times a week.    . losartan (COZAAR) 100 MG tablet Take 100 mg by mouth daily.    . methocarbamol (ROBAXIN) 750 MG tablet Take 750 mg by mouth 3 (three) times daily.  5  . oxybutynin (DITROPAN XL) 10 MG 24 hr  tablet Take 1 tablet (10 mg total) by mouth at bedtime. 30 tablet 6  . oxyCODONE-acetaminophen (PERCOCET) 10-325 MG tablet TK 1 T PO Q 6 H PRF PAIN    . primidone (MYSOLINE) 50 MG tablet Take 1 tablet in AM and 6 tablets at bedtime 210 tablet 5  . sertraline (ZOLOFT) 100 MG tablet Take 1 tablet (100 mg total) by mouth daily. 90 tablet 0  . spironolactone (ALDACTONE) 50 MG tablet Take 50 mg by mouth 2 (two) times daily.    . Vitamin D, Ergocalciferol, (DRISDOL) 50000 units CAPS capsule Take 1 capsule (50,000 Units total) by mouth every 7 (seven) days. (Patient not taking: Reported on 09/21/2019) 5 capsule 3  . zolpidem (AMBIEN CR) 12.5 MG CR tablet Take 1 tablet (12.5 mg total) by mouth at bedtime as needed for sleep. 30 tablet 1   No current facility-administered medications for this visit.      Allergies:   Ace inhibitors   Social History:  The patient  reports that she has never smoked. She has never used smokeless tobacco. She reports that she does not drink alcohol or use drugs.   Family History:  The patient's family history includes Atrial fibrillation in her sister; Cancer in her maternal grandfather; Diabetes in her maternal grandmother and mother; Hyperlipidemia in her mother; Hypertension in her maternal grandmother and mother; Kidney disease in her father and maternal grandmother; Pancreatic cancer in her maternal grandmother; Sarcoidosis in her mother.  ROS:  Please see the history of present illness.  All other systems are reviewed and otherwise negative.   PHYSICAL EXAM: *** VS:  LMP 09/17/2017 (Approximate)  BMI: There is no height or weight on file to calculate BMI. Well nourished, well developed, in no acute distress  HEENT: normocephalic, atraumatic  Neck: no JVD, carotid bruits or masses Cardiac:  *** RRR; no significant murmurs, no rubs, or gallops Lungs:  *** CTA b/l, no wheezing, rhonchi or rales  Abd: soft, nontender MS: no deformity or *** atrophy Ext: *** no edema  Skin: warm and dry, no rash Neuro:  No gross deficits appreciated Psych: euthymic mood, full affect    EKG:  Done today and reviewed by myself shows ***  11/2016: 2 week monitor Sinus rhythm and sinus tachycardia All symptoms associated with sinus rhythm and sinus tachycardia No arrhythmias seen Zero atrial fibrillation  Recent Labs: 02/06/2019: Hemoglobin 13.6; Platelet Count 346  No results found for requested labs within last 8760 hours.   CrCl cannot be calculated (Patient's most recent lab result is older than the maximum 21 days allowed.).   Wt Readings from Last 3 Encounters:  09/27/19 230 lb (104.3 kg)  03/28/19 235 lb (106.6 kg)  03/23/19 235 lb (106.6 kg)     Other studies reviewed: Additional studies/records reviewed today include: summarized above  ASSESSMENT AND  PLAN:  1. H/o palpitations     Associated with SR/ST     *** Better with BB  2. HTN     ***    Disposition: F/u with ***  Current medicines are reviewed at length with the patient today.  The patient did not have any concerns regarding medicines.***  Signed, Tommye Standard, PA-C 10/30/2019 6:47 AM     [facility] [address] [place] [address] [phone] (office)  [phone] (fax)

## 2019-10-31 ENCOUNTER — Ambulatory Visit: Payer: Medicaid Other | Admitting: Physician Assistant

## 2019-11-16 ENCOUNTER — Ambulatory Visit (INDEPENDENT_AMBULATORY_CARE_PROVIDER_SITE_OTHER): Payer: Medicaid Other | Admitting: Psychiatry

## 2019-11-16 ENCOUNTER — Encounter: Payer: Self-pay | Admitting: Cardiology

## 2019-11-16 ENCOUNTER — Other Ambulatory Visit: Payer: Self-pay

## 2019-11-16 ENCOUNTER — Telehealth (INDEPENDENT_AMBULATORY_CARE_PROVIDER_SITE_OTHER): Payer: Medicaid Other | Admitting: Cardiology

## 2019-11-16 VITALS — HR 65 | Wt 250.0 lb

## 2019-11-16 DIAGNOSIS — F330 Major depressive disorder, recurrent, mild: Secondary | ICD-10-CM

## 2019-11-16 DIAGNOSIS — R000 Tachycardia, unspecified: Secondary | ICD-10-CM

## 2019-11-16 DIAGNOSIS — R079 Chest pain, unspecified: Secondary | ICD-10-CM

## 2019-11-16 DIAGNOSIS — R0609 Other forms of dyspnea: Secondary | ICD-10-CM

## 2019-11-16 MED ORDER — METOPROLOL TARTRATE 100 MG PO TABS: ORAL_TABLET | ORAL | 0 refills | Status: DC

## 2019-11-16 MED ORDER — SERTRALINE HCL 100 MG PO TABS: 100.0000 mg | ORAL_TABLET | Freq: Every day | ORAL | 0 refills | Status: DC

## 2019-11-16 MED ORDER — BUPROPION HCL ER (XL) 300 MG PO TB24: 300.0000 mg | ORAL_TABLET | Freq: Every day | ORAL | 0 refills | Status: DC

## 2019-11-16 NOTE — Progress Notes (Signed)
Virtual Visit via Telephone Note  I connected with Erin Bradford on [id number] at 10:40 AM EST by telephone and verified that I am speaking with the correct person using two identifiers.   I discussed the limitations, risks, security and privacy concerns of performing an evaluation and management service by telephone and the availability of in person appointments. I also discussed with the patient that there may be a patient responsible charge related to this service. The patient expressed understanding and agreed to proceed.   History of Present Illness: Patient was evaluated by phone session.  She feels her current medicine is working.  Since we increased the Zoloft she is doing much better.  She is no longer needed Ambien frequently.  She has no tremors, shakes or any EPS.  Her school is going online and she has been handling much better.  Her sleep is good.  She denies any crying spells or any feeling of hopelessness or worthlessness.  Her energy level is good.   Psychiatric Specialty Exam: Physical Exam  ROS  Last menstrual period 09/17/2017.There is no height or weight on file to calculate BMI.  General Appearance: NA  Eye Contact:  NA  Speech:  Clear and Coherent and Normal Rate  Volume:  Normal  Mood:  Euthymic  Affect:  Appropriate  Thought Process:  Goal Directed  Orientation:  Full (Time, Place, and Person)  Thought Content:  WDL  Suicidal Thoughts:  No  Homicidal Thoughts:  No  Memory:  Immediate;   Good Recent;   Good Remote;   Good  Judgement:  Good  Insight:  Good  Psychomotor Activity:  NA  Concentration:  Concentration: Good and Attention Span: Good  Recall:  NA  Fund of Knowledge:  NA  Language:  Good  Akathisia:  No  Handed:  Right  AIMS (if indicated):     Assets:  Communication Skills Desire for Improvement Housing Resilience  ADL's:  Intact  Cognition:  WNL  Sleep:   ok       Assessment and Plan: Primary insomnia.  Major depressive disorder,  recurrent.  Patient doing much better on her current medication.  She has no tremors shakes or any EPS.  She is no longer taking Ambien.  Continue Zoloft 1 mg and Wellbutrin XL 3 mg daily.  She feel increase Zoloft has helped her anxiety.  Recommended to call us back if she has any question or any concern.  Follow-up in 3 months.  Follow Up Instructions:    I discussed the assessment and treatment plan with the patient. The patient was provided an opportunity to ask questions and all were answered. The patient agreed with the plan and demonstrated an understanding of the instructions.   The patient was advised to call back or seek an in-person evaluation if the symptoms worsen or if the condition fails to improve as anticipated.  I provided 15 minutes of non-face-to-face time during this encounter.   [name], MD

## 2019-11-16 NOTE — Addendum Note (Signed)
Addended by: Stephani Police on: 11/16/2019 04:51 PM   Modules accepted: Orders

## 2019-11-16 NOTE — Progress Notes (Signed)
Electrophysiology TeleHealth Note   Due to national recommendations of social distancing due to COVID 19, an audio/video telehealth visit is felt to be most appropriate for this patient at this time.  See Epic message for the patient's consent to telehealth for [facility].   Date:  [id number]   ID:  Erin Bradford, DOB Oct 06, 1971, MRN [id number]  Location: patient's home  Provider location: [address], [place]  Evaluation Performed: Follow-up visit  PCP:  Azzie Glatter, FNP  Cardiologist:  No primary care provider on file.  Electrophysiologist:  Dr Curt Bears  Chief Complaint:  palpitations  History of Present Illness:    Erin Bradford is a 48 y.o. female who presents via audio/video conferencing for a telehealth visit today.  Since last being seen in our clinic, the patient reports doing very well.  Today, she denies symptoms of palpitations, chest pain, shortness of breath,  lower extremity edema, dizziness, presyncope, or syncope.  The patient is otherwise without complaint today.  The patient denies symptoms of fevers, chills, cough, or new SOB worrisome for COVID 19.  History of hypertension, obesity, palpitations.  Her blood pressure has in the past been fairly well controlled without much in the way of palpitations.  Her main issue is weakness and fatigue.    Today, denies symptoms of orthopnea, PND, lower extremity edema, claudication, dizziness, presyncope, syncope, bleeding, or neurologic sequela. The patient is tolerating medications without difficulties.  She is continue to have palpitations, shortness of breath.  This is despite her carvedilol.  She is also having episodes of chest pain.  The pain is at times worse when she exerts herself and improved with rest, but it does also occur with rest.  This is not a repeatable event.  She is quite worried at this time.  She does also get short of breath which has been occurring over the last few  months.  She tells me that she had an insurance physical who noted a murmur.  Past Medical History:  Diagnosis Date  . Abnormal laboratory test   . Acid reflux   . Allergy   . Anemia   . Anxiety   . Arthritis   . Blood transfusion without reported diagnosis   . BMI 39.0-39.9,adult   . Chronic hypokalemia   . Chronic kidney disease    mild  . Depression   . GERD (gastroesophageal reflux disease)   . Heart murmur   . Hypertension   . Insomnia   . Low back pain   . Obesity   . Occasional tremors   . Palpitations    dx with tachycardia   . PONV (postoperative nausea and vomiting)   . Tiredness   . Urinary frequency     Past Surgical History:  Procedure Laterality Date  . CESAREAN SECTION    . TOOTH EXTRACTION    . TOTAL LAPAROSCOPIC HYSTERECTOMY WITH SALPINGECTOMY Bilateral 10/06/2017   Procedure: ATTEMPTED HYSTERECTOMY TOTAL LAPAROSCOPIC WITH SALPINGECTOMY CONVERTED TO OPEN ABDOMINAL TOTAL HYSTERECTOMY WITH SALPINGECTOMY;  Surgeon: Sherlyn Hay, DO;  Location: [facility];  Service: Gynecology;  Laterality: Bilateral;    Current Outpatient Medications  Medication Sig Dispense Refill  . buPROPion (WELLBUTRIN XL) 300 MG 24 hr tablet Take 1 tablet (300 mg total) by mouth daily. 90 tablet 0  . carvedilol (COREG) 12.5 MG tablet Take 1.5 tablets (18.75 mg total) by mouth 2 (two) times daily. 270 tablet 3  . cetirizine (ZYRTEC) 10 MG  tablet TAKE 1 TABLET(10 MG) BY MOUTH AT BEDTIME AS NEEDED FOR ALLERGIES 30 tablet 1  . famotidine (PEPCID) 20 MG tablet Take 1 tablet (20 mg total) by mouth 2 (two) times daily. 60 tablet 3  . Fluocinolone Acetonide Body 0.01 % OIL Place 2 drops into both ears 2 (two) times daily as needed (for ear pain).     . fluticasone (FLONASE) 50 MCG/ACT nasal spray Place 2 sprays into both nostrils daily. 16 g 6  . furosemide (LASIX) 20 MG tablet Take 20 mg by mouth 2 (two) times daily.     Marland Kitchen gabapentin (NEURONTIN) 300 MG capsule Take  300 mg by mouth 3 (three) times daily.     . Glucose Blood (BLOOD GLUCOSE TEST STRIPS) STRP 2 (two) times a week.    . losartan (COZAAR) 100 MG tablet Take 100 mg by mouth daily.    . methocarbamol (ROBAXIN) 750 MG tablet Take 750 mg by mouth 3 (three) times daily.  5  . oxybutynin (DITROPAN XL) 10 MG 24 hr tablet Take 1 tablet (10 mg total) by mouth at bedtime. 30 tablet 6  . oxyCODONE-acetaminophen (PERCOCET) 10-325 MG tablet TK 1 T PO Q 6 H PRF PAIN    . primidone (MYSOLINE) 50 MG tablet Take 1 tablet in AM and 6 tablets at bedtime 210 tablet 5  . sertraline (ZOLOFT) 100 MG tablet Take 1 tablet (100 mg total) by mouth daily. 90 tablet 0  . spironolactone (ALDACTONE) 50 MG tablet Take 50 mg by mouth 2 (two) times daily.    . Vitamin D, Ergocalciferol, (DRISDOL) 50000 units CAPS capsule Take 1 capsule (50,000 Units total) by mouth every 7 (seven) days. 5 capsule 3  . zolpidem (AMBIEN CR) 12.5 MG CR tablet Take 1 tablet (12.5 mg total) by mouth at bedtime as needed for sleep. 30 tablet 1  . ACCU-CHEK AVIVA PLUS test strip USE TO CHECK BLOOD SUGAR TWICE WEEKLY  11  . AMITIZA 24 MCG capsule      No current facility-administered medications for this visit.     Allergies:   Ace inhibitors   Social History:  The patient  reports that she has never smoked. She has never used smokeless tobacco. She reports that she does not drink alcohol or use drugs.   Family History:  The patient's  family history includes Atrial fibrillation in her sister; Cancer in her maternal grandfather; Diabetes in her maternal grandmother and mother; Hyperlipidemia in her mother; Hypertension in her maternal grandmother and mother; Kidney disease in her father and maternal grandmother; Pancreatic cancer in her maternal grandmother; Sarcoidosis in her mother.   ROS:  Please see the history of present illness.   All other systems are personally reviewed and negative.    Exam:    Vital Signs:  Pulse 65   Wt 250 lb (113.4  kg)   LMP 09/17/2017 (Approximate)   BMI 39.75 kg/m  no acute distress, no shortness of breath.  Labs/Other Tests and Data Reviewed:    Recent Labs: 02/06/2019: Hemoglobin 13.6; Platelet Count 346   Wt Readings from Last 3 Encounters:  11/16/19 250 lb (113.4 kg)  09/27/19 230 lb (104.3 kg)  03/28/19 235 lb (106.6 kg)     Other studies personally reviewed: Additional studies/ records that were reviewed today include: ECG 07/19/2018 personally reviewed Review of the above records today demonstrates: Sinus rhythm  ASSESSMENT & PLAN:    1.  Palpitations: Currently on carvedilol.  She has having some weakness  and fatigue.  This may be due to the carvedilol.  Due to that, we will potentially change her medications, but we will get an echocardiogram prior to that.  2.  Hypertension: Has not been recently checked.  3.  Murmur: She is having weakness and fatigue.  Along with her new murmur, we will plan for a transthoracic echo.  4.  Chest pain: At this point it is unclear to me as to the cause of her chest pain.  I am unsure if she is having coronary issues.  We will plan for a coronary CT.   COVID 19 screen The patient denies symptoms of COVID 19 at this time.  The importance of social distancing was discussed today.  Follow-up: Pending above monitoring   Current medicines are reviewed at length with the patient today.   The patient does not have concerns regarding her medicines.  The following changes were made today:  none  Labs/ tests ordered today include:  No orders of the defined types were placed in this encounter.    Patient Risk:  after full review of this patients clinical status, I feel that they are at moderate risk at this time.  Today, I have spent 8 minutes with the patient with telehealth technology discussing palpitations, shortness of breath, chest pain.    Signed, Will Meredith Leeds, MD  11/16/2019 4:10 PM     [place]  [address] [place] [address] [phone] (office) [phone] (fax)

## 2019-11-16 NOTE — Patient Instructions (Signed)
Medication Instructions:   *If you need a refill on your cardiac medications before your next appointment, please call your pharmacy*  Lab Work:  If you have labs (blood work) drawn today and your tests are completely normal, you will receive your results only by: Marland Kitchen MyChart Message (if you have MyChart) OR . A paper copy in the mail If you have any lab test that is abnormal or we need to change your treatment, we will call you to review the results.  Testing/Procedures: Your physician has requested that you have an echocardiogram. Echocardiography is a painless test that uses sound waves to create images of your heart. It provides your doctor with information about the size and shape of your heart and how well your heart's chambers and valves are working. This procedure takes approximately one hour. There are no restrictions for this procedure.  Your physician has requested that you have cardiac CT. Cardiac computed tomography (CT) is a painless test that uses an x-ray machine to take clear, detailed pictures of your heart. For further information please visit [email or web address]. Please follow instruction sheet as given.     Follow-Up: At [facility], you and your health needs are our priority.  As part of our continuing mission to provide you with exceptional heart care, we have created designated Provider Care Teams.  These Care Teams include your primary Cardiologist (physician) and Advanced Practice Providers (APPs -  Physician Assistants and Nurse Practitioners) who all work together to provide you with the care you need, when you need it.  Your next appointment:   6 month(s)  The format for your next appointment:   Either In Person or Virtual  Provider:   Allegra Lai, MD  Other Instructions

## 2019-11-19 ENCOUNTER — Encounter (HOSPITAL_COMMUNITY): Payer: Self-pay | Admitting: Physician Assistant

## 2019-11-20 ENCOUNTER — Telehealth (HOSPITAL_COMMUNITY): Payer: Self-pay | Admitting: *Deleted

## 2019-11-20 NOTE — Telephone Encounter (Signed)
Pt left message that she has been approved for disability and is requesting a letter stating that she may have a "therapy dog" in her apartment. Do not see a note reflecting discussing with you. Please advise.

## 2019-11-21 ENCOUNTER — Other Ambulatory Visit (HOSPITAL_COMMUNITY): Payer: Self-pay | Admitting: Psychiatry

## 2019-11-21 ENCOUNTER — Other Ambulatory Visit (HOSPITAL_COMMUNITY): Payer: Self-pay | Admitting: *Deleted

## 2019-11-21 ENCOUNTER — Telehealth (HOSPITAL_COMMUNITY): Payer: Self-pay | Admitting: *Deleted

## 2019-11-21 DIAGNOSIS — F5101 Primary insomnia: Secondary | ICD-10-CM

## 2019-11-21 DIAGNOSIS — F330 Major depressive disorder, recurrent, mild: Secondary | ICD-10-CM

## 2019-11-21 MED ORDER — ZOLPIDEM TARTRATE ER 12.5 MG PO TBCR: 12.5000 mg | EXTENDED_RELEASE_TABLET | Freq: Every evening | ORAL | 1 refills | Status: DC | PRN

## 2019-11-21 MED ORDER — SERTRALINE HCL 100 MG PO TABS: 100.0000 mg | ORAL_TABLET | Freq: Every day | ORAL | 0 refills | Status: DC

## 2019-11-21 MED ORDER — BUPROPION HCL ER (XL) 300 MG PO TB24: 300.0000 mg | ORAL_TABLET | Freq: Every day | ORAL | 0 refills | Status: DC

## 2019-11-21 NOTE — Telephone Encounter (Signed)
Pt called c/o not having refills on Zoloft, Wellbutrin, and Ambien. Pt has changed pharmacy sent these, Zoloft and Wellbutrin, were sent. Writer resent to [facility] on [address] per pt request. Pt does need a refill on Ambien 12.5mg  CR. Last filled 08/20.

## 2019-11-21 NOTE — Telephone Encounter (Signed)
Done

## 2019-11-26 ENCOUNTER — Other Ambulatory Visit: Payer: Self-pay

## 2019-11-26 ENCOUNTER — Ambulatory Visit (HOSPITAL_COMMUNITY): Payer: Medicaid Other | Attending: Cardiology

## 2019-11-26 ENCOUNTER — Telehealth: Payer: Self-pay | Admitting: *Deleted

## 2019-11-26 DIAGNOSIS — R0600 Dyspnea, unspecified: Secondary | ICD-10-CM

## 2019-11-26 DIAGNOSIS — R000 Tachycardia, unspecified: Secondary | ICD-10-CM | POA: Diagnosis not present

## 2019-11-26 DIAGNOSIS — R079 Chest pain, unspecified: Secondary | ICD-10-CM | POA: Diagnosis not present

## 2019-11-26 DIAGNOSIS — R0609 Other forms of dyspnea: Secondary | ICD-10-CM

## 2019-11-26 MED ORDER — METOPROLOL TARTRATE 100 MG PO TABS: ORAL_TABLET | ORAL | 0 refills | Status: DC

## 2019-11-26 NOTE — Telephone Encounter (Signed)
Rx sent in

## 2019-11-26 NOTE — Telephone Encounter (Signed)
Received message from echo dept:  Erin Bradford, Mrs. Noack is a pt of [facility], and was here for her echo this morning. She took the CT Meds prior to her Echo instead, so she will need another prescription. Not sure if this is something you can help with or not. looks like it was either [place] or [place] who ordered the Echo/CT. Just let me know, Thanks!

## 2019-11-28 ENCOUNTER — Other Ambulatory Visit (HOSPITAL_COMMUNITY): Payer: Self-pay | Admitting: *Deleted

## 2019-11-28 ENCOUNTER — Telehealth (HOSPITAL_COMMUNITY): Payer: Self-pay | Admitting: *Deleted

## 2019-11-28 NOTE — Telephone Encounter (Signed)
Received message from pt requesting a letter for her apartment manager allowing her to have an emotional support animal. I didn't see anything in notes reflecting this. Please review and advise.

## 2019-11-28 NOTE — Telephone Encounter (Signed)
She never mentioned about support animal but we can write a letter so she can get emotional support animal.

## 2019-11-28 NOTE — Telephone Encounter (Signed)
Ok got it

## 2019-12-04 ENCOUNTER — Encounter (HOSPITAL_COMMUNITY): Payer: Self-pay

## 2019-12-04 ENCOUNTER — Telehealth (HOSPITAL_COMMUNITY): Payer: Self-pay

## 2019-12-05 NOTE — Telephone Encounter (Signed)
Error

## 2019-12-14 ENCOUNTER — Other Ambulatory Visit: Payer: Medicaid Other

## 2019-12-19 ENCOUNTER — Telehealth (HOSPITAL_COMMUNITY): Payer: Self-pay | Admitting: Emergency Medicine

## 2019-12-19 NOTE — Telephone Encounter (Signed)
Left message on voicemail with name and callback number Katti Pelle RN Navigator Cardiac Imaging [facility] [phone] Office [phone] Cell  

## 2019-12-24 ENCOUNTER — Ambulatory Visit (HOSPITAL_COMMUNITY)
Admission: RE | Admit: 2019-12-24 | Discharge: 2019-12-24 | Disposition: A | Discharge status: Home / Self Care | Payer: Medicaid Other | Source: Ambulatory Visit | Attending: Physician Assistant | Admitting: Physician Assistant

## 2020-01-12 ENCOUNTER — Other Ambulatory Visit: Payer: Self-pay | Admitting: Family Medicine

## 2020-01-12 DIAGNOSIS — J302 Other seasonal allergic rhinitis: Secondary | ICD-10-CM

## 2020-01-18 ENCOUNTER — Encounter (HOSPITAL_COMMUNITY): Payer: Self-pay | Admitting: *Deleted

## 2020-01-18 DIAGNOSIS — F5101 Primary insomnia: Secondary | ICD-10-CM

## 2020-01-18 DIAGNOSIS — M5416 Radiculopathy, lumbar region: Secondary | ICD-10-CM | POA: Diagnosis not present

## 2020-01-18 DIAGNOSIS — Z79891 Long term (current) use of opiate analgesic: Secondary | ICD-10-CM | POA: Diagnosis not present

## 2020-01-18 DIAGNOSIS — M48061 Spinal stenosis, lumbar region without neurogenic claudication: Secondary | ICD-10-CM | POA: Diagnosis not present

## 2020-01-18 DIAGNOSIS — M5136 Other intervertebral disc degeneration, lumbar region: Secondary | ICD-10-CM | POA: Diagnosis not present

## 2020-01-18 DIAGNOSIS — M47816 Spondylosis without myelopathy or radiculopathy, lumbar region: Secondary | ICD-10-CM | POA: Diagnosis not present

## 2020-01-18 DIAGNOSIS — K5903 Drug induced constipation: Secondary | ICD-10-CM | POA: Diagnosis not present

## 2020-01-18 DIAGNOSIS — G894 Chronic pain syndrome: Secondary | ICD-10-CM | POA: Diagnosis not present

## 2020-01-18 DIAGNOSIS — T402X5A Adverse effect of other opioids, initial encounter: Secondary | ICD-10-CM | POA: Diagnosis not present

## 2020-01-18 MED ORDER — ZOLPIDEM TARTRATE ER 12.5 MG PO TBCR: 12.5000 mg | EXTENDED_RELEASE_TABLET | Freq: Every evening | ORAL | 0 refills | Status: DC | PRN

## 2020-01-20 ENCOUNTER — Telehealth: Payer: Medicaid Other | Admitting: Family

## 2020-01-20 DIAGNOSIS — J4541 Moderate persistent asthma with (acute) exacerbation: Secondary | ICD-10-CM

## 2020-01-20 MED ORDER — PREDNISONE 20 MG PO TABS: 40.0000 mg | ORAL_TABLET | Freq: Every day | ORAL | 0 refills | Status: DC

## 2020-01-20 MED ORDER — ALBUTEROL SULFATE HFA 108 (90 BASE) MCG/ACT IN AERS: 2.0000 | INHALATION_SPRAY | Freq: Four times a day (QID) | RESPIRATORY_TRACT | 0 refills | Status: DC | PRN

## 2020-01-20 NOTE — Progress Notes (Signed)
Visit for Asthma  Based on what you have shared with me, it looks like you may have a flare up of your asthma.  Asthma is a chronic (ongoing) lung disease which results in airway obstruction, inflammation and hyper-responsiveness.   Asthma symptoms vary from person to person, with common symptoms including nighttime awakening and decreased ability to participate in normal activities as a result of shortness of breath. It is often triggered by changes in weather, changes in the season, changes in air temperature, or inside (home, school, daycare or work) allergens such as animal dander, mold, mildew, woodstoves or cockroaches.   It can also be triggered by hormonal changes, extreme emotion, physical exertion or an upper respiratory tract illness.     It is important to identify the trigger, and then eliminate or avoid the trigger if possible.   If you have been prescribed medications to be taken on a regular basis, it is important to follow the asthma action plan and to follow guidelines to adjust medication in response to increasing symptoms of decreased peak expiratory flow rate  Treatment: I have prescribed: Albuterol (Proventil HFA; Ventolin HFA) 108 (90 Base) MCG/ACT Inhaler 2 puffs into the lungs every six hours as needed for wheezing or shortness of breath and Prednisone 40mg  by mouth per day for 5 - 7 days.   Giving your shortness of breath you do need close follow up with your PCP, I recommend calling them tomorrow. If your symptoms worsen you need to be seen face to face to be evaluated.  HOME CARE . Only take medications as instructed by your medical team. . Consider wearing a mask or scarf to improve breathing air temperature have been shown to decrease irritation and decrease exacerbations . Get rest. . Taking a steamy shower or using a humidifier may help nasal congestion  sand ease sore throat pain. You can place a towel over your head and breathe in the steam from hot water coming from a faucet. . Using a saline nasal spray works much the same way.  . Cough drops, hare candies and sore throat lozenges may ease your cough.  . Avoid close contacts especially the very you and the elderly . Cover your mouth if you cough or sneeze . Always remember to wash your hands.    GET HELP RIGHT AWAY IF: . You develop worsening symptoms; breathlessness at rest, drowsy, confused or agitated, unable to speak in full sentences . You have coughing fits . You develop a severe headache or visual changes . You develop shortness of breath, difficulty breathing or start having chest pain . Your symptoms persist after you have completed your treatment plan . If your symptoms do not improve within 10 days  MAKE SURE YOU . Understand these instructions. . Will watch your condition. . Will get help right away if you are not doing well or get worse.   Your e-visit answers were reviewed by a board certified advanced clinical practitioner to complete your personal care plan, Depending upon the condition, your plan could have included both over the counter or prescription medications.  Please review your pharmacy choice. Your safety is important to [place]. If you have drug allergies check your prescription carefully. You can use MyChart to ask questions about today's visit, request a non-urgent call back, or ask for a work or school excuse for 24 hours related to this e-Visit. If it has been greater than 24 hours you will need to follow up with your provider, or  enter a new e-Visit to address those concerns.  You will get an e-mail in the next two days asking about your experience. I hope that your e-visit has been valuable and will speed your recovery. Thank you for using e-visits.  Approximately 5 minutes was spent documenting and reviewing patient's chart.

## 2020-01-24 ENCOUNTER — Other Ambulatory Visit (HOSPITAL_COMMUNITY): Payer: Self-pay

## 2020-01-24 DIAGNOSIS — F5101 Primary insomnia: Secondary | ICD-10-CM

## 2020-01-24 MED ORDER — ZOLPIDEM TARTRATE ER 12.5 MG PO TBCR: 12.5000 mg | EXTENDED_RELEASE_TABLET | Freq: Every evening | ORAL | 0 refills | Status: DC | PRN

## 2020-01-30 ENCOUNTER — Telehealth (INDEPENDENT_AMBULATORY_CARE_PROVIDER_SITE_OTHER): Payer: Medicaid Other | Admitting: Family Medicine

## 2020-01-30 ENCOUNTER — Other Ambulatory Visit: Payer: Self-pay

## 2020-01-30 DIAGNOSIS — R350 Frequency of micturition: Secondary | ICD-10-CM

## 2020-01-30 DIAGNOSIS — F419 Anxiety disorder, unspecified: Secondary | ICD-10-CM

## 2020-01-30 DIAGNOSIS — R0602 Shortness of breath: Secondary | ICD-10-CM | POA: Diagnosis not present

## 2020-01-30 DIAGNOSIS — I10 Essential (primary) hypertension: Secondary | ICD-10-CM | POA: Diagnosis not present

## 2020-01-30 DIAGNOSIS — J4541 Moderate persistent asthma with (acute) exacerbation: Secondary | ICD-10-CM | POA: Diagnosis not present

## 2020-01-30 DIAGNOSIS — Z09 Encounter for follow-up examination after completed treatment for conditions other than malignant neoplasm: Secondary | ICD-10-CM

## 2020-01-30 MED ORDER — ALBUTEROL SULFATE HFA 108 (90 BASE) MCG/ACT IN AERS: 2.0000 | INHALATION_SPRAY | Freq: Four times a day (QID) | RESPIRATORY_TRACT | 12 refills | Status: DC | PRN

## 2020-01-30 MED ORDER — ALBUTEROL SULFATE (2.5 MG/3ML) 0.083% IN NEBU: 2.5000 mg | INHALATION_SOLUTION | Freq: Four times a day (QID) | RESPIRATORY_TRACT | 12 refills | Status: DC | PRN

## 2020-01-30 NOTE — Progress Notes (Signed)
Virtual Visit via Telephone Note  I connected with Erin Bradford on [id number] at  3:00 PM EST by telephone and verified that I am speaking with the correct person using two identifiers.   I discussed the limitations, risks, security and privacy concerns of performing an evaluation and management service by telephone and the availability of in person appointments. I also discussed with the patient that there may be a patient responsible charge related to this service. The patient expressed understanding and agreed to proceed.   History of Present Illness:  Past Medical History:  Diagnosis Date  . Abnormal laboratory test   . Acid reflux   . Allergy   . Anemia   . Anxiety   . Arthritis   . Blood transfusion without reported diagnosis   . BMI 39.0-39.9,adult   . Chronic hypokalemia   . Chronic kidney disease    mild  . Depression   . GERD (gastroesophageal reflux disease)   . Heart murmur   . Hypertension   . Insomnia   . Low back pain   . Obesity   . Occasional tremors   . Palpitations    dx with tachycardia   . PONV (postoperative nausea and vomiting)   . Tiredness   . Urinary frequency     Past Surgical History:  Procedure Laterality Date  . CESAREAN SECTION    . TOOTH EXTRACTION    . TOTAL LAPAROSCOPIC HYSTERECTOMY WITH SALPINGECTOMY Bilateral 10/06/2017   Procedure: ATTEMPTED HYSTERECTOMY TOTAL LAPAROSCOPIC WITH SALPINGECTOMY CONVERTED TO OPEN ABDOMINAL TOTAL HYSTERECTOMY WITH SALPINGECTOMY;  Surgeon: Sherlyn Hay, DO;  Location: [place];  Service: Gynecology;  Laterality: Bilateral;    Social History   Tobacco Use  . Smoking status: Never Smoker  . Smokeless tobacco: Never Used  Substance Use Topics  . Alcohol use: No  . Drug use: No    Family History  Problem Relation Age of Onset  . Diabetes Mother   . Hypertension Mother   . Hyperlipidemia Mother   . Sarcoidosis Mother   . Kidney disease Father   . Atrial fibrillation  Sister   . Kidney disease Maternal Grandmother   . Pancreatic cancer Maternal Grandmother   . Diabetes Maternal Grandmother   . Hypertension Maternal Grandmother   . Cancer Maternal Grandfather   . Colon cancer Neg Hx   . Colon polyps Neg Hx   . Esophageal cancer Neg Hx   . Stomach cancer Neg Hx   . Rectal cancer Neg Hx     Allergies  Allergen Reactions  . Ace Inhibitors Anaphylaxis and Swelling    Swelling of tongue.    Current Outpatient Medications on File Prior to Visit  Medication Sig Dispense Refill  . AMITIZA 24 MCG capsule     . buPROPion (WELLBUTRIN XL) 300 MG 24 hr tablet Take 1 tablet (300 mg total) by mouth daily. 90 tablet 0  . carvedilol (COREG) 12.5 MG tablet Take 1.5 tablets (18.75 mg total) by mouth 2 (two) times daily. 270 tablet 3  . cetirizine (ZYRTEC) 10 MG tablet TAKE 1 TABLET(10 MG) BY MOUTH AT BEDTIME AS NEEDED FOR ALLERGIES 30 tablet 1  . famotidine (PEPCID) 20 MG tablet Take 1 tablet (20 mg total) by mouth 2 (two) times daily. 60 tablet 3  . Fluocinolone Acetonide Body 0.01 % OIL Place 2 drops into both ears 2 (two) times daily as needed (for ear pain).     . fluticasone (FLONASE) 50 MCG/ACT nasal spray Place  2 sprays into both nostrils daily. 16 g 6  . furosemide (LASIX) 20 MG tablet Take 20 mg by mouth 2 (two) times daily.     Marland Kitchen gabapentin (NEURONTIN) 300 MG capsule Take 300 mg by mouth 3 (three) times daily.     . Glucose Blood (BLOOD GLUCOSE TEST STRIPS) STRP 2 (two) times a week.    . losartan (COZAAR) 100 MG tablet Take 100 mg by mouth daily.    . methocarbamol (ROBAXIN) 750 MG tablet Take 750 mg by mouth 3 (three) times daily.  5  . oxybutynin (DITROPAN XL) 10 MG 24 hr tablet Take 1 tablet (10 mg total) by mouth at bedtime. 30 tablet 6  . primidone (MYSOLINE) 50 MG tablet Take 1 tablet in AM and 6 tablets at bedtime 210 tablet 5  . sertraline (ZOLOFT) 100 MG tablet Take 1 tablet (100 mg total) by mouth daily. 90 tablet 0  . spironolactone  (ALDACTONE) 50 MG tablet Take 50 mg by mouth 2 (two) times daily.    Marland Kitchen ACCU-CHEK AVIVA PLUS test strip USE TO CHECK BLOOD SUGAR TWICE WEEKLY  11  . metoprolol tartrate (LOPRESSOR) 100 MG tablet Take 2 hours before your scheduled Cardiac CT... do not take if your Heart rate is less than 55 beats per minute. 1 tablet 0  . oxyCODONE-acetaminophen (PERCOCET) 10-325 MG tablet TK 1 T PO Q 6 H PRF PAIN    . predniSONE (DELTASONE) 20 MG tablet Take 2 tablets (40 mg total) by mouth daily with breakfast. 2 po at sametime daily for 5 days 10 tablet 0  . Vitamin D, Ergocalciferol, (DRISDOL) 50000 units CAPS capsule Take 1 capsule (50,000 Units total) by mouth every 7 (seven) days. 5 capsule 3  . zolpidem (AMBIEN CR) 12.5 MG CR tablet Take 1 tablet (12.5 mg total) by mouth at bedtime as needed for sleep. 30 tablet 0   No current facility-administered medications on file prior to visit.    Current Status: Since her last office visit, she is doing well with no complaints. She recently had Televisit for Shortness of breath and was prescribed an Albuterol Inhaler. Today, she denies chest pain, heart palpitations, cough and shortness of breath reported.Her anxiety is mild today. She denies suicidal ideations, homicidal ideations, or auditory hallucinations. She continues to follow up with Psychiatrist as needed. She denies fevers, chills, fatigue, recent infections, weight loss, and night sweats. She has not had any headaches, visual changes, dizziness, and falls. No reports of GI problems such as nausea, vomiting, diarrhea, and constipation. She has no reports of blood in stools, dysuria and hematuria. She denies pain today.    Observations/Objective:  Telephone Virtual Visit  Assessment and Plan:  1. Moderate persistent asthma with acute exacerbation - albuterol (PROVENTIL) (2.5 MG/3ML) 0.083% nebulizer solution; Take 3 mLs (2.5 mg total) by nebulization every 6 (six) hours as needed for wheezing or shortness  of breath.  Dispense: 75 mL; Refill: 12 - albuterol (VENTOLIN HFA) 108 (90 Base) MCG/ACT inhaler; Inhale 2 puffs into the lungs every 6 (six) hours as needed for wheezing or shortness of breath.  Dispense: 8 g; Refill: 12  2. Hypertension, unspecified type She will continue to take medications as prescribed, to decrease high sodium intake, excessive alcohol intake, increase potassium intake, smoking cessation, and increase physical activity of at least 30 minutes of cardio activity daily. She will continue to follow Heart Healthy or DASH diet.  3. Shortness of breath Stable today. No signs or symptoms of  respiratory distress noted or reported.   4. Anxiety  5. Urinary frequency  6. Follow up She will follow up for Labs only within this week.  She will follow up for Office Visit in 6 months.   Meds ordered this encounter  Medications  . albuterol (PROVENTIL) (2.5 MG/3ML) 0.083% nebulizer solution    Sig: Take 3 mLs (2.5 mg total) by nebulization every 6 (six) hours as needed for wheezing or shortness of breath.    Dispense:  75 mL    Refill:  12  . albuterol (VENTOLIN HFA) 108 (90 Base) MCG/ACT inhaler    Sig: Inhale 2 puffs into the lungs every 6 (six) hours as needed for wheezing or shortness of breath.    Dispense:  8 g    Refill:  12    No orders of the defined types were placed in this encounter.   Referral Orders  No referral(s) requested today    [name],  MSN, FNP-BC [place] [address] [place], [place] [address] [phone] fax     I discussed the assessment and treatment plan with the patient. The patient was provided an opportunity to ask questions and all were answered. The patient agreed with the plan and demonstrated an understanding of the instructions.   The patient was advised to call back or seek an in-person evaluation if the symptoms worsen or if the  condition fails to improve as anticipated.  I provided 15 minutes of non-face-to-face time during this encounter.   Azzie Glatter, FNP

## 2020-01-31 ENCOUNTER — Other Ambulatory Visit: Payer: Self-pay

## 2020-02-11 ENCOUNTER — Other Ambulatory Visit: Payer: Self-pay

## 2020-02-11 ENCOUNTER — Ambulatory Visit (HOSPITAL_COMMUNITY): Payer: Medicaid Other | Admitting: Psychiatry

## 2020-02-19 ENCOUNTER — Encounter (HOSPITAL_COMMUNITY): Payer: Self-pay | Admitting: Psychiatry

## 2020-02-19 ENCOUNTER — Ambulatory Visit (INDEPENDENT_AMBULATORY_CARE_PROVIDER_SITE_OTHER): Payer: Medicaid Other | Admitting: Psychiatry

## 2020-02-19 ENCOUNTER — Other Ambulatory Visit: Payer: Self-pay

## 2020-02-19 DIAGNOSIS — F330 Major depressive disorder, recurrent, mild: Secondary | ICD-10-CM | POA: Diagnosis not present

## 2020-02-19 DIAGNOSIS — F5101 Primary insomnia: Secondary | ICD-10-CM

## 2020-02-19 MED ORDER — SERTRALINE HCL 100 MG PO TABS: 100.0000 mg | ORAL_TABLET | Freq: Every day | ORAL | 0 refills | Status: DC

## 2020-02-19 MED ORDER — BUPROPION HCL ER (XL) 300 MG PO TB24: 300.0000 mg | ORAL_TABLET | Freq: Every day | ORAL | 0 refills | Status: DC

## 2020-02-19 NOTE — Progress Notes (Signed)
Virtual Visit via Telephone Note  I connected with Erin Bradford on [id number] at 10:20 AM EST by telephone and verified that I am speaking with the correct person using two identifiers.   I discussed the limitations, risks, security and privacy concerns of performing an evaluation and management service by telephone and the availability of in person appointments. I also discussed with the patient that there may be a patient responsible charge related to this service. The patient expressed understanding and agreed to proceed.   History of Present Illness: Patient was evaluated through phone session.  She is doing much better since she had disability approved when she moved in in a better place with her daughter.  She also got [place] husky dog and really enjoyed the company of animal.  She is compliant with Zoloft and Wellbutrin.  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  She is sleeping better.  She is worried about her weight but due to back pain unable to do exercise.  She like to go back on Topamax which she had took in the past but we have told not to take it because at that time she was also taking phentermine.  She really feels that occasional Ambien and Wellbutrin and Zoloft combination working very well.  She denies any crying spells or any feeling of hopelessness or worthlessness.  She denies any suicidal thoughts.  Her energy level is fair.  Past Psychiatric History:Reviewed H/Odepression. No h/oinpatient, mania, psychosisor suicidal attempt. Tried Prozac which worked but had weight gain. Tried Vistaril, trazodone, Thorazine,Lunesta3 mg,Doxepin 50 mg and saphris 5 mgfor insomnia with poor outcome.We tried Lamictal but that caused a rash.   Psychiatric Specialty Exam: Physical Exam  Review of Systems  Last menstrual period 09/17/2017.There is no height or weight on file to calculate BMI.  General Appearance: NA  Eye Contact:  NA  Speech:  Clear and  Coherent and Normal Rate  Volume:  Normal  Mood:  Euthymic  Affect:  NA  Thought Process:  Goal Directed  Orientation:  Full (Time, Place, and Person)  Thought Content:  Logical  Suicidal Thoughts:  No  Homicidal Thoughts:  No  Memory:  Immediate;   Good Recent;   Good Remote;   Good  Judgement:  Intact  Insight:  Present  Psychomotor Activity:  NA  Concentration:  Concentration: Good and Attention Span: Good  Recall:  Good  Fund of Knowledge:  Good  Language:  Good  Akathisia:  No  Handed:  Right  AIMS (if indicated):     Assets:  Communication Skills Desire for Improvement Housing Resilience  ADL's:  Intact  Cognition:  WNL  Sleep:   7 hrs      Assessment and Plan: Major depressive disorder, recurrent.  Primary insomnia.  I discussed her current medication.  She is taking Ambien as needed for insomnia and I suggested that she should avoid taking Topamax as combination can cause more memory issues.  She agreed with the plan.  I encouraged that she should try watching her calorie intake to lose weight.  Continue Zoloft 100 mg daily and Wellbutrin XL 3 mg daily.  Recommended to call us back if she has any question or any concern.  Follow-up in 3 months.  Follow Up Instructions:    I discussed the assessment and treatment plan with the patient. The patient was provided an opportunity to ask questions and all were answered. The patient agreed with the plan and demonstrated an understanding  of the instructions.   The patient was advised to call back or seek an in-person evaluation if the symptoms worsen or if the condition fails to improve as anticipated.  I provided 20 minutes of non-face-to-face time during this encounter.   [name], MD

## 2020-02-28 ENCOUNTER — Other Ambulatory Visit: Payer: Self-pay

## 2020-02-28 DIAGNOSIS — R0609 Other forms of dyspnea: Secondary | ICD-10-CM

## 2020-02-28 DIAGNOSIS — Z01810 Encounter for preprocedural cardiovascular examination: Secondary | ICD-10-CM

## 2020-02-28 DIAGNOSIS — R079 Chest pain, unspecified: Secondary | ICD-10-CM

## 2020-03-03 ENCOUNTER — Telehealth (HOSPITAL_COMMUNITY): Payer: Self-pay

## 2020-03-03 DIAGNOSIS — F5101 Primary insomnia: Secondary | ICD-10-CM

## 2020-03-03 NOTE — Telephone Encounter (Signed)
Did she got the medication?

## 2020-03-03 NOTE — Telephone Encounter (Signed)
Medication management - Telephone call with pt who reported she has been unable to fill her prescribed Ambien CR 12.5 mg as the medication needs a new quantity exception to be completed for prior approval.  Called patient's [facility] who verified patient is in need of a new quantity exception override approval with Medicaid.  Called [facility] [employer] and printed out and completed form for [employer] quantity exception so patient can take each night one as needed.  Form prepared with request for submission. Called pt back to inform her request was prepared and would be sent in once provider has reviewed and signed to request quantity exception for 30 tablets for 30 days a month.

## 2020-03-06 NOTE — Telephone Encounter (Signed)
Patient if she had tried temazepam 15 mg at bedtime.  If not been please call the prescription temazepam 15 mg to take at bedtime for insomnia.

## 2020-03-06 NOTE — Telephone Encounter (Signed)
Medication management - Medication management - Telephone call with Ebony Hail, representative with [facility] [employer] to follow up on pt's prior authorization request for quantity exception for Ambien CR and then with patient to inform of denial ruling from Medicaid to cover Ambien CR as patient had not tried and failed two of the preferred other medications.  Discussed the fact patient had failed regular Ambien, Trazodone, Lunesta, Thorazine, and Doxepin.  Representative verified patient had failed regular Ambien as was on request but not one of the other preferred medications, Flurazepam in any dosage or Restoril in the preferred 15 mg or 30 mg dosage.  Patient verified with call she had not tried either of these and is willing to try one of them if Dr. Adele Schilder wants to try one.  Patient stats she continues to have problems with sleep daily and agreed to send information to Dr. Adele Schilder to see if he wants to try one of these two preferred medications and will contact patient back once he reviews. Patient agreed with paln.

## 2020-03-07 MED ORDER — TEMAZEPAM 15 MG PO CAPS: 15.0000 mg | ORAL_CAPSULE | Freq: Every day | ORAL | 0 refills | Status: DC

## 2020-03-07 NOTE — Telephone Encounter (Signed)
Medication management - Telephone message left for patient we would be sending in a Temazepam order for 15 mg, one at bedtime for sleep as pt had told this nures on 03/06/20 she had never tried this previously and wanted to try it if Dr. Adele Schilder agreed. Informed this order would be sent to her [facility] [address]. And requested patient call us back if any questions or any problems with the medications. Verified with message this was a sedative medication that should help with her insomnia and to be careful as it should make her sleepy after taking.  New order called into patient's [facility] as ordered by Dr. Adele Schilder for 15 mg, one at bedtime for insomnia, #30 with no refills.

## 2020-03-07 NOTE — Telephone Encounter (Signed)
Ambien CR 12.5 mg discontinued per Dr. Adele Schilder as patient will be trying Temazepam 15mg , one at bedtime for insomnia.

## 2020-03-07 NOTE — Addendum Note (Signed)
Addended by: Watt Climes on: 03/07/2020 08:41 AM   Modules accepted: Orders

## 2020-03-13 ENCOUNTER — Other Ambulatory Visit (HOSPITAL_COMMUNITY): Payer: Self-pay | Admitting: *Deleted

## 2020-03-13 DIAGNOSIS — F5101 Primary insomnia: Secondary | ICD-10-CM

## 2020-03-13 NOTE — Telephone Encounter (Signed)
Opened in error

## 2020-03-14 ENCOUNTER — Telehealth (HOSPITAL_COMMUNITY): Payer: Self-pay | Admitting: *Deleted

## 2020-03-14 ENCOUNTER — Other Ambulatory Visit (HOSPITAL_COMMUNITY): Payer: Self-pay | Admitting: *Deleted

## 2020-03-14 DIAGNOSIS — F330 Major depressive disorder, recurrent, mild: Secondary | ICD-10-CM

## 2020-03-14 DIAGNOSIS — F5101 Primary insomnia: Secondary | ICD-10-CM

## 2020-03-14 MED ORDER — SERTRALINE HCL 100 MG PO TABS: 100.0000 mg | ORAL_TABLET | Freq: Every day | ORAL | 0 refills | Status: DC

## 2020-03-14 MED ORDER — TEMAZEPAM 15 MG PO CAPS: 15.0000 mg | ORAL_CAPSULE | Freq: Every day | ORAL | 0 refills | Status: DC

## 2020-03-14 NOTE — Telephone Encounter (Signed)
Writer left VMM for pt to inform that restoril and zoloft sent to [facility]  At [address]. [place] confirms pt never picked up Rx for Restoril sent there previously.

## 2020-03-17 ENCOUNTER — Other Ambulatory Visit: Payer: Medicaid Other

## 2020-03-19 ENCOUNTER — Other Ambulatory Visit: Payer: Self-pay | Admitting: Family Medicine

## 2020-03-19 DIAGNOSIS — J302 Other seasonal allergic rhinitis: Secondary | ICD-10-CM

## 2020-03-21 ENCOUNTER — Ambulatory Visit (HOSPITAL_COMMUNITY): Payer: Medicaid Other

## 2020-03-23 ENCOUNTER — Other Ambulatory Visit: Payer: Self-pay | Admitting: Family Medicine

## 2020-03-23 DIAGNOSIS — J302 Other seasonal allergic rhinitis: Secondary | ICD-10-CM

## 2020-03-25 ENCOUNTER — Telehealth (HOSPITAL_COMMUNITY): Payer: Self-pay

## 2020-03-25 ENCOUNTER — Other Ambulatory Visit (HOSPITAL_COMMUNITY): Payer: Self-pay

## 2020-03-25 DIAGNOSIS — M25561 Pain in right knee: Secondary | ICD-10-CM | POA: Diagnosis not present

## 2020-03-25 DIAGNOSIS — M48061 Spinal stenosis, lumbar region without neurogenic claudication: Secondary | ICD-10-CM | POA: Diagnosis not present

## 2020-03-25 DIAGNOSIS — M47816 Spondylosis without myelopathy or radiculopathy, lumbar region: Secondary | ICD-10-CM | POA: Diagnosis not present

## 2020-03-25 DIAGNOSIS — G8929 Other chronic pain: Secondary | ICD-10-CM | POA: Diagnosis not present

## 2020-03-25 DIAGNOSIS — M25562 Pain in left knee: Secondary | ICD-10-CM | POA: Diagnosis not present

## 2020-03-25 DIAGNOSIS — F5101 Primary insomnia: Secondary | ICD-10-CM

## 2020-03-25 DIAGNOSIS — M5136 Other intervertebral disc degeneration, lumbar region: Secondary | ICD-10-CM | POA: Diagnosis not present

## 2020-03-25 DIAGNOSIS — Z79891 Long term (current) use of opiate analgesic: Secondary | ICD-10-CM | POA: Diagnosis not present

## 2020-03-25 DIAGNOSIS — M170 Bilateral primary osteoarthritis of knee: Secondary | ICD-10-CM | POA: Diagnosis not present

## 2020-03-25 NOTE — Telephone Encounter (Signed)
We have tried Vistaril, trazodone, Thorazine, doxepin, Saphris, Lunesta and now temazepam with poor outcome.  She was doing better on Ambien but insurance denied.  Will she consider going back to Ambien?  I am not sure if the insurance will pay she may have to get out of her pocket.

## 2020-03-25 NOTE — Telephone Encounter (Signed)
During phone call, pt stated the only medication she found helpful was Ambien and she did want to go back on it.

## 2020-03-25 NOTE — Telephone Encounter (Signed)
Spoke with patient. She is okay if insurance does not pay. Prescription called into pharmacy.

## 2020-03-25 NOTE — Telephone Encounter (Signed)
I am okay to prescribe Ambien but her insurance does not approve.  If she is okay to pay out-of-pocket and please call the Ambien to her local pharmacy.  Ambien 10 mg as needed for insomnia #30.

## 2020-03-25 NOTE — Telephone Encounter (Signed)
Pt called stating she is having headaches after taking her "new medication" (temazepam). Pt reports she stopped taking the medication and her headaches went away. States she needs something for sleep. Next appointment is 5/17. Please review and advise.

## 2020-03-28 ENCOUNTER — Ambulatory Visit: Payer: Self-pay | Admitting: Family Medicine

## 2020-04-01 ENCOUNTER — Encounter: Payer: Self-pay | Admitting: Family Medicine

## 2020-04-01 ENCOUNTER — Telehealth: Payer: Self-pay | Admitting: Family Medicine

## 2020-04-01 ENCOUNTER — Ambulatory Visit (INDEPENDENT_AMBULATORY_CARE_PROVIDER_SITE_OTHER): Payer: Medicaid Other | Admitting: Family Medicine

## 2020-04-01 ENCOUNTER — Other Ambulatory Visit: Payer: Self-pay

## 2020-04-01 DIAGNOSIS — J302 Other seasonal allergic rhinitis: Secondary | ICD-10-CM

## 2020-04-01 DIAGNOSIS — I10 Essential (primary) hypertension: Secondary | ICD-10-CM

## 2020-04-01 DIAGNOSIS — F419 Anxiety disorder, unspecified: Secondary | ICD-10-CM | POA: Diagnosis not present

## 2020-04-01 DIAGNOSIS — Z789 Other specified health status: Secondary | ICD-10-CM

## 2020-04-01 DIAGNOSIS — R0602 Shortness of breath: Secondary | ICD-10-CM | POA: Diagnosis not present

## 2020-04-01 DIAGNOSIS — Z09 Encounter for follow-up examination after completed treatment for conditions other than malignant neoplasm: Secondary | ICD-10-CM

## 2020-04-01 DIAGNOSIS — K219 Gastro-esophageal reflux disease without esophagitis: Secondary | ICD-10-CM | POA: Diagnosis not present

## 2020-04-01 NOTE — Progress Notes (Signed)
Virtual Visit via Telephone Note  I connected with Erin Bradford on [id number] at 11:00 AM EDT by telephone and verified that I am speaking with the correct person using two identifiers.   I discussed the limitations, risks, security and privacy concerns of performing an evaluation and management service by telephone and the availability of in person appointments. I also discussed with the patient that there may be a patient responsible charge related to this service. The patient expressed understanding and agreed to proceed.  Histoy of Present Illness:  Past Surgical History:  Procedure Laterality Date  . CESAREAN SECTION    . TOOTH EXTRACTION    . TOTAL LAPAROSCOPIC HYSTERECTOMY WITH SALPINGECTOMY Bilateral 10/06/2017   Procedure: ATTEMPTED HYSTERECTOMY TOTAL LAPAROSCOPIC WITH SALPINGECTOMY CONVERTED TO OPEN ABDOMINAL TOTAL HYSTERECTOMY WITH SALPINGECTOMY;  Surgeon: Sherlyn Hay, DO;  Location: [facility];  Service: Gynecology;  Laterality: Bilateral;    Social History   Socioeconomic History  . Marital status: Widowed    Spouse name: Not on file  . Number of children: Not on file  . Years of education: Not on file  . Highest education level: Not on file  Occupational History  . Not on file  Tobacco Use  . Smoking status: Never Smoker  . Smokeless tobacco: Never Used  Substance and Sexual Activity  . Alcohol use: No  . Drug use: No  . Sexual activity: Not Currently    Birth control/protection: Condom  Other Topics Concern  . Not on file  Social History Narrative   Leave with daugther   2-story apartment   Rt hand   occasion 1- cup coffee      Social Determinants of Health   Financial Resource Strain:   . Difficulty of Paying Living Expenses:   Food Insecurity:   . Worried About [name] in the Last Year:   . Arboriculturist in the Last Year:   Transportation Needs:   . Film/video editor (Medical):   Marland Kitchen Lack of  Transportation (Non-Medical):   Physical Activity:   . Days of Exercise per Week:   . Minutes of Exercise per Session:   Stress:   . Feeling of Stress :   Social Connections:   . Frequency of Communication with Friends and Family:   . Frequency of Social Gatherings with Friends and Family:   . Attends Religious Services:   . Active Member of Clubs or Organizations:   . Attends Archivist Meetings:   Marland Kitchen Marital Status:   Intimate Partner Violence:   . Fear of Current or Ex-Partner:   . Emotionally Abused:   Marland Kitchen Physically Abused:   . Sexually Abused:     Allergies  Allergen Reactions  . Ace Inhibitors Anaphylaxis and Swelling    Swelling of tongue.    Family History  Problem Relation Age of Onset  . Diabetes Mother   . Hypertension Mother   . Hyperlipidemia Mother   . Sarcoidosis Mother   . Kidney disease Father   . Atrial fibrillation Sister   . Kidney disease Maternal Grandmother   . Pancreatic cancer Maternal Grandmother   . Diabetes Maternal Grandmother   . Hypertension Maternal Grandmother   . Cancer Maternal Grandfather   . Colon cancer Neg Hx   . Colon polyps Neg Hx   . Esophageal cancer Neg Hx   . Stomach cancer Neg Hx   . Rectal cancer Neg Hx     Past Medical History:  Diagnosis Date  . Abnormal laboratory test   . Acid reflux   . Allergy   . Anemia   . Anxiety   . Arthritis   . Blood transfusion without reported diagnosis   . BMI 39.0-39.9,adult   . Chronic hypokalemia   . Chronic kidney disease    mild  . Depression   . GERD (gastroesophageal reflux disease)   . H/O: hysterectomy   . Heart murmur   . Hypertension   . Insomnia   . Low back pain   . Obesity   . Occasional tremors   . Palpitations    dx with tachycardia   . PONV (postoperative nausea and vomiting)   . Tiredness   . Urinary frequency     Patient Active Problem List   Diagnosis Date Noted  . Urinary frequency 05/09/2019  . Anxiety 05/09/2019  .  Gastroesophageal reflux disease without esophagitis 05/09/2019  . Weight loss of more than 10% body weight 05/09/2019  . Prediabetes 11/28/2017  . Post-operative state 10/06/2017  . Seasonal allergies 09/27/2017  . Tachycardia 09/02/2017  . Hypertensive disorder 09/02/2017  . 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 Iron deficiency anemia 02/04/2017  . Iron deficiency anemia due to chronic blood loss 02/04/2017  . Tremor 01/25/2017  . Class 3 obesity with serious comorbidity and body mass index (BMI) of 40.0 to 44.9 in adult 01/25/2017  . Chronic low back pain 01/25/2017    Current Status: Since her last office visit, she is doing well with no complaints. She continues to follow up with Psychiatrist as needed. She is currently in taking classes and has high anxiety at this time. She was recently placed on Ambien. She denies suicidal ideations, homicidal ideations, or auditory hallucinations. She denies fevers, chills, fatigue, recent infections, weight loss, and night sweats. She has not had any headaches, visual changes, dizziness, and falls. No chest pain, heart palpitations, cough and shortness of breath reported. No reports of GI problems such as nausea, vomiting, diarrhea, and constipation. She has no reports of blood in stools, dysuria and hematuria. She is taking all medications as prescribed. She has moderate pain today. She continue to follow up with [facility] as needed.   Observations/Objective:  Telephone Virtual Visit   Assessment and Plan:  1. Gastroesophageal reflux disease without esophagitis  2. Anxiety Stable.   3. Hypentension, unspecified type She will continue to take medications as prescribed, to decrease high sodium intake, excessive alcohol intake, increase potassium intake, smoking cessation, and increase physical activity of at least 30 minutes of cardio activity daily. She will continue to follow Heart Healthy or DASH diet.  4.  Shortness of breath Stable. No signs or symptoms of respiratory distress noted or reported.   5. Seasonal allergies  6. Under care of pain management specialist  7. Follow up She will follow up in 2 months.   No orders of the defined types were placed in this encounter.   No orders of the defined types were placed in this encounter.   Referral Orders  No referral(s) requested today   [name],  MSN, FNP-BC [facility] [address] [place], [place] [address] [phone]- fax     I discussed the assessment and treatment plan with the patient. The patient was provided an opportunity to ask questions and all were answered. The patient agreed with the plan and demonstrated an understanding of the instructions.  The patient was advised to call back or seek an in-person evaluation if the symptoms worsen or if the condition fails to improve as anticipated.  I provided 20 minutes of non-face-to-face time during this encounter.   Azzie Glatter, FNP

## 2020-04-01 NOTE — Telephone Encounter (Signed)
Nurse completed telephone intake with patient at 11:41 am. Attempted to contact patient at 11:50 am for Telephone Virtual Visit. No answer, not able to leave voice message. Will attempt later.

## 2020-04-01 NOTE — Telephone Encounter (Signed)
Called Pt several times to schedule appointment mailbox was full could not leave a message

## 2020-04-10 ENCOUNTER — Other Ambulatory Visit: Payer: Self-pay | Admitting: Cardiology

## 2020-04-23 ENCOUNTER — Telehealth: Payer: Self-pay | Admitting: Neurology

## 2020-04-23 ENCOUNTER — Other Ambulatory Visit: Payer: Self-pay

## 2020-04-23 NOTE — Telephone Encounter (Signed)
Sent rx in to pharmacy 

## 2020-04-23 NOTE — Telephone Encounter (Signed)
Patient called to request refill of Primidone 50mg  to be sent to [facility] on [address]

## 2020-04-26 ENCOUNTER — Other Ambulatory Visit: Payer: Self-pay | Admitting: Neurology

## 2020-05-12 ENCOUNTER — Telehealth (INDEPENDENT_AMBULATORY_CARE_PROVIDER_SITE_OTHER): Payer: Medicaid Other | Admitting: Psychiatry

## 2020-05-12 ENCOUNTER — Encounter (HOSPITAL_COMMUNITY): Payer: Self-pay | Admitting: Psychiatry

## 2020-05-12 ENCOUNTER — Other Ambulatory Visit: Payer: Self-pay

## 2020-05-12 DIAGNOSIS — F5101 Primary insomnia: Secondary | ICD-10-CM | POA: Diagnosis not present

## 2020-05-12 DIAGNOSIS — F330 Major depressive disorder, recurrent, mild: Secondary | ICD-10-CM

## 2020-05-12 MED ORDER — BUPROPION HCL ER (XL) 300 MG PO TB24: 300.0000 mg | ORAL_TABLET | Freq: Every day | ORAL | 0 refills | Status: DC

## 2020-05-12 MED ORDER — SERTRALINE HCL 100 MG PO TABS: 100.0000 mg | ORAL_TABLET | Freq: Every day | ORAL | 0 refills | Status: DC

## 2020-05-12 MED ORDER — ZOLPIDEM TARTRATE ER 12.5 MG PO TBCR: 12.5000 mg | EXTENDED_RELEASE_TABLET | Freq: Every evening | ORAL | 2 refills | Status: DC | PRN

## 2020-05-12 NOTE — Progress Notes (Signed)
Virtual Visit via Telephone Note  I connected with Erin Bradford on [id number] at 10:40 AM EDT by telephone and verified that I am speaking with the correct person using two identifiers.   I discussed the limitations, risks, security and privacy concerns of performing an evaluation and management service by telephone and the availability of in person appointments. I also discussed with the patient that there may be a patient responsible charge related to this service. The patient expressed understanding and agreed to proceed.   History of Present Illness: Patient is evaluated by phone session.  She is doing much better and now her Ambien CR 12.5 is improved she is sleeping much better.  We have tried multiple medication to help her sleep and last one was temazepam which causes headaches.  She admitted it was struggled to get prior authorization but finally she got it and it helps her sleep.  She also reported her depression and anxiety is under control.  She denies any recent crying spells or any feeling of hopelessness or worthlessness.  She lives with her daughter since her disability improved and moving to a better place helps her depression.  Past Psychiatric History:Reviewed H/Odepression. No h/oinpatient,mania, psychosisor suicidal attempt. Tried Prozac which worked but had weight gain. Tried Vistaril, trazodone, Thorazine,Lunesta3 mg,Doxepin 50 mg and saphris 5 mg with poor outcome.Lamictal caused rash and temazepam caused headache.   Psychiatric Specialty Exam: Physical Exam  Review of Systems  Last menstrual period 09/17/2017.There is no height or weight on file to calculate BMI.  General Appearance: NA  Eye Contact:  NA  Speech:  Clear and Coherent  Volume:  Normal  Mood:  Euthymic  Affect:  NA  Thought Process:  Goal Directed  Orientation:  Full (Time, Place, and Person)  Thought Content:  Logical  Suicidal Thoughts:  No  Homicidal Thoughts:  No  Memory:   Immediate;   Good Recent;   Good Remote;   Good  Judgement:  Good  Insight:  Good  Psychomotor Activity:  NA  Concentration:  Concentration: Good and Attention Span: Good  Recall:  Good  Fund of Knowledge:  Good  Language:  Good  Akathisia:  No  Handed:  Right  AIMS (if indicated):     Assets:  Communication Skills Desire for Improvement Housing Resilience Social Support Transportation  ADL's:  Intact  Cognition:  WNL  Sleep:   good      Assessment and Plan: Major depressive disorder, recurrent.  Primary insomnia.  Patient doing well and she feels Ambien CR helping her sleep.  Continue Ambien CR 12.5 mg at bedtime, Zoloft 100 mg daily and Wellbutrin XL 300 mg daily.  Recommended to call us back if she has any questions or any concerns.  Follow-up in 3 months.  Follow Up Instructions:    I discussed the assessment and treatment plan with the patient. The patient was provided an opportunity to ask questions and all were answered. The patient agreed with the plan and demonstrated an understanding of the instructions.   The patient was advised to call back or seek an in-person evaluation if the symptoms worsen or if the condition fails to improve as anticipated.  I provided 20 minutes of non-face-to-face time during this encounter.   [name], MD

## 2020-05-14 ENCOUNTER — Other Ambulatory Visit: Payer: Self-pay | Admitting: Family Medicine

## 2020-05-14 DIAGNOSIS — K219 Gastro-esophageal reflux disease without esophagitis: Secondary | ICD-10-CM

## 2020-05-30 ENCOUNTER — Other Ambulatory Visit: Payer: Self-pay | Admitting: Family Medicine

## 2020-05-30 DIAGNOSIS — J302 Other seasonal allergic rhinitis: Secondary | ICD-10-CM

## 2020-07-22 DIAGNOSIS — M533 Sacrococcygeal disorders, not elsewhere classified: Secondary | ICD-10-CM | POA: Diagnosis not present

## 2020-07-22 DIAGNOSIS — Z79891 Long term (current) use of opiate analgesic: Secondary | ICD-10-CM | POA: Diagnosis not present

## 2020-07-22 DIAGNOSIS — M48061 Spinal stenosis, lumbar region without neurogenic claudication: Secondary | ICD-10-CM | POA: Diagnosis not present

## 2020-07-22 DIAGNOSIS — M25561 Pain in right knee: Secondary | ICD-10-CM | POA: Diagnosis not present

## 2020-07-22 DIAGNOSIS — G8929 Other chronic pain: Secondary | ICD-10-CM | POA: Diagnosis not present

## 2020-07-22 DIAGNOSIS — M25562 Pain in left knee: Secondary | ICD-10-CM | POA: Diagnosis not present

## 2020-07-22 DIAGNOSIS — M170 Bilateral primary osteoarthritis of knee: Secondary | ICD-10-CM | POA: Diagnosis not present

## 2020-07-22 DIAGNOSIS — M47816 Spondylosis without myelopathy or radiculopathy, lumbar region: Secondary | ICD-10-CM | POA: Diagnosis not present

## 2020-07-22 DIAGNOSIS — M5136 Other intervertebral disc degeneration, lumbar region: Secondary | ICD-10-CM | POA: Diagnosis not present

## 2020-07-28 ENCOUNTER — Ambulatory Visit: Payer: Self-pay | Admitting: Family Medicine

## 2020-08-08 ENCOUNTER — Other Ambulatory Visit: Payer: Self-pay | Admitting: Family Medicine

## 2020-08-08 DIAGNOSIS — J302 Other seasonal allergic rhinitis: Secondary | ICD-10-CM

## 2020-08-11 ENCOUNTER — Ambulatory Visit: Payer: Self-pay | Admitting: Family Medicine

## 2020-08-12 ENCOUNTER — Telehealth (INDEPENDENT_AMBULATORY_CARE_PROVIDER_SITE_OTHER): Payer: Medicaid Other | Admitting: Psychiatry

## 2020-08-12 ENCOUNTER — Other Ambulatory Visit: Payer: Self-pay

## 2020-08-12 DIAGNOSIS — F330 Major depressive disorder, recurrent, mild: Secondary | ICD-10-CM

## 2020-08-12 DIAGNOSIS — F5101 Primary insomnia: Secondary | ICD-10-CM | POA: Diagnosis not present

## 2020-08-12 MED ORDER — BUPROPION HCL ER (XL) 300 MG PO TB24: 300.0000 mg | ORAL_TABLET | Freq: Every day | ORAL | 0 refills | Status: DC

## 2020-08-12 MED ORDER — ZOLPIDEM TARTRATE ER 12.5 MG PO TBCR: 12.5000 mg | EXTENDED_RELEASE_TABLET | Freq: Every evening | ORAL | 2 refills | Status: DC | PRN

## 2020-08-12 MED ORDER — SERTRALINE HCL 100 MG PO TABS: 100.0000 mg | ORAL_TABLET | Freq: Every day | ORAL | 0 refills | Status: DC

## 2020-08-12 NOTE — Progress Notes (Signed)
Virtual Visit via Telephone Note  I connected with Erin Bradford on [birth year] at 10:40 AM EDT by telephone and verified that I am speaking with the correct person using two identifiers.  Location: Patient: home Provider: home office   I discussed the limitations, risks, security and privacy concerns of performing an evaluation and management service by telephone and the availability of in person appointments. I also discussed with the patient that there may be a patient responsible charge related to this service. The patient expressed understanding and agreed to proceed.   History of Present Illness: Patient is evaluated by phone session.  She has struggled getting her Ambien at the pharmacy.  Patient reported that it requires prior authorization but we never received any request from the pharmacy.  She is getting 15 tablets but sometimes she requires extra.  She is out of the medication and struggle with insomnia but overall things are going well for her.  She is happy that her daughter back to school.  Patient denies any suicidal thoughts and her anxiety and depression is under control.  Patient denies any feeling of hopelessness or worthlessness.  She lives with her daughter.  Her daughter received disability and patient is a relief and relaxed.  She feels the current medicine is working and reported no tremors shakes or any EPS.    Past Psychiatric History:Reviewed H/Odepression. No h/oinpatient,mania, psychosisor suicidal attempt. Tried Prozac which worked buthadweight gain. Tried Vistaril, trazodone, Thorazine,Lunesta3 mg,Doxepin 50 mg and saphris 5 mg with poor outcome.Lamictal caused rash and temazepam caused headache.  Psychiatric Specialty Exam: Physical Exam  Review of Systems  Last menstrual period 09/17/2017.There is no height or weight on file to calculate BMI.  General Appearance: NA  Eye Contact:  NA  Speech:  Clear and Coherent  Volume:  Normal  Mood:   Euthymic  Affect:  NA  Thought Process:  Goal Directed  Orientation:  Full (Time, Place, and Person)  Thought Content:  Logical  Suicidal Thoughts:  No  Homicidal Thoughts:  No  Memory:  Immediate;   Good Recent;   Good Remote;   Good  Judgement:  Intact  Insight:  Good  Psychomotor Activity:  NA  Concentration:  Concentration: Good and Attention Span: Good  Recall:  Good  Fund of Knowledge:  Good  Language:  Good  Akathisia:  No  Handed:  Right  AIMS (if indicated):     Assets:  Communication Skills Desire for Improvement Housing Resilience  ADL's:  Intact  Cognition:  WNL  Sleep:   fair      Assessment and Plan: Major depressive disorder, recurrent.  Primary insomnia.  I recommend to have her pharmacy call if Ambien requires prior authorization.  Patient agreed.  We will continue Zoloft 100 mg daily, Wellbutrin XL 300 mg daily and Ambien CR 12.5 mg at bedtime to take as needed.  Recommended to call us back if she is any question or any concern.  Follow-up in 3 months.  Follow Up Instructions:    I discussed the assessment and treatment plan with the patient. The patient was provided an opportunity to ask questions and all were answered. The patient agreed with the plan and demonstrated an understanding of the instructions.   The patient was advised to call back or seek an in-person evaluation if the symptoms worsen or if the condition fails to improve as anticipated.  I provided 20 minutes of non-face-to-face time during this encounter.   [name], MD

## 2020-08-28 NOTE — Progress Notes (Signed)
Due to the COVID-19 crisis, this virtual check-in visit was done via telephone from my office and it was initiated and consent given by this patient and or family.  Telephone (Audio) Visit The purpose of this telephone visit is to provide medical care while limiting exposure to the novel coronavirus.    Consent was obtained for telephone visit and initiated by pt/family:  Yes.   Answered questions that patient had about telehealth interaction:  Yes.   I discussed the limitations, risks, security and privacy concerns of performing an evaluation and management service by telephone. I also discussed with the patient that there may be a patient responsible charge related to this service. The patient expressed understanding and agreed to proceed.  Pt location: Home Physician Location: office Name of referring provider:  Azzie Glatter, FNP I connected with .Brinkley R Seckman at patients initiation/request on 08/29/2020 at 10:10 AM EDT by telephone and verified that I am speaking with the correct person using two identifiers.  Pt MRN:  [id number] Pt DOB:  11/11/1971  Assessment/Plan:   Essential tremor  1.  Continue primidone 50mg  in morning and 300mg  at bedtime 2.  Follow up in one year  Need for in person visit now:  No.  Subjective:   Erin Bradford is a 49 year old right-handed female with hypertension, iron deficiency anemia, prediabetes, mild chronic kidney disease, and anxiety who follows up for essential tremor.  UPDATE: She currently takes primidone 50mg  in AM and 300mg  at bedtime. She also takes gabapentin 300mg  three times daily.  Other medications include Zoloft 100mg  daily, and Percocet as needed.  Tremors have been controlled.     HISTORY: Beginning in 2017, she reports tremor in both hands. She only notices it when she is performing a task, but not at rest. 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 She also reports difficulty pouring  liquids in a spoon or using utensils. It does not affect writing. She denies any triggers or anything that improves symptoms. She thinks it has gotten a little worse over the past year. She denies starting any new medication prior to onset of symptoms. She denies family history of tremor. TSH was 0.92.   She endorses short term memory problems. She frequently is experiencing word-finding difficulties. This has been ongoing for about a year but has gotten worse over the past 3 months. Other than the primidone, she has not had any changes or new medications. She has not had trouble with finances or disorientation on familiar routes. To evaluate memory deficits, B12 and TSH were checked, which were 663 and 1.05 respectively.It was determined that her memory problems were related to depression.  However, she subsequently felt it has gotten worse.  She reports that her mother has some memory issues but has not been diagnosed with dementia. She reports that her grandmother developed some dementia at age 48. She reports depression, anxiety and difficulty sleeping.  She was previously on topiramate for anxiety and overeating, but her psychiatrist stopped her.      Objective:   There were no vitals filed for this visit.   Follow Up Instructions:      -I discussed the assessment and treatment plan with the patient. The patient was provided an opportunity to ask questions and all were answered. The patient agreed with the plan and demonstrated an understanding of the instructions.   The patient was advised to call back or seek an in-person evaluation if the symptoms worsen or if  the condition fails to improve as anticipated.    Total Time spent in visit with the patient was:  7 minutes  Dudley Major, DO

## 2020-08-29 ENCOUNTER — Encounter: Payer: Self-pay | Admitting: Neurology

## 2020-08-29 ENCOUNTER — Other Ambulatory Visit: Payer: Self-pay

## 2020-08-29 ENCOUNTER — Telehealth (INDEPENDENT_AMBULATORY_CARE_PROVIDER_SITE_OTHER): Payer: Medicaid Other | Admitting: Neurology

## 2020-08-29 DIAGNOSIS — G250 Essential tremor: Secondary | ICD-10-CM

## 2020-09-01 ENCOUNTER — Other Ambulatory Visit: Payer: Self-pay | Admitting: Family Medicine

## 2020-09-01 DIAGNOSIS — R350 Frequency of micturition: Secondary | ICD-10-CM

## 2020-09-15 ENCOUNTER — Telehealth (HOSPITAL_COMMUNITY): Payer: Self-pay

## 2020-09-15 NOTE — Telephone Encounter (Signed)
Received a fax from [facility] for a Denial on patient's Zolpidem 12.5 mg CR tablet. Patient's health plan only allows 15 tablets per month due to duplicate therapy per [place].

## 2020-09-16 NOTE — Telephone Encounter (Signed)
Please call pharmacy and ask what is the process for override. I am happy to do that.

## 2020-09-16 NOTE — Telephone Encounter (Signed)
Patient called and stated that she tried to get her Zolpidem 12.5mg  last week but they would only fill it for 15 so she didn't get it. She wants to get it straightened out to get the 30 that was prescribed instead. Patient stated that you have done an override before on #30. Please review and advise. Thank you.

## 2020-09-18 ENCOUNTER — Telehealth (HOSPITAL_COMMUNITY): Payer: Self-pay

## 2020-09-18 NOTE — Telephone Encounter (Signed)
Spoke with pharmacy regarding patient's Zolpidem 12.5mg  CR tab. Patient's health plan only allows a "quantity limit of 15 units per month" Erin Bradford from [facility] [employer] stated that the pharmacy needs to do an override. I relayed that to [place] [facility] [place]. When she runs it, the medication is rejected for #15. It allows the patient to get 14 tablets/month. I called the patient to discuss this with her and to see if she wanted to get the Zolpidem #14 or switch to something else she can take qhs every night. Patient didn't pick up - LVM to call us back

## 2020-09-18 NOTE — Telephone Encounter (Signed)
Spoke again with Colletta [place] at the pharmacy. Provider cannot do an override. Waiting to hear back from patient

## 2020-09-19 ENCOUNTER — Telehealth: Payer: Self-pay | Admitting: Family Medicine

## 2020-09-22 ENCOUNTER — Telehealth: Payer: Self-pay | Admitting: Family Medicine

## 2020-09-22 ENCOUNTER — Other Ambulatory Visit: Payer: Self-pay | Admitting: Family Medicine

## 2020-09-22 ENCOUNTER — Telehealth (HOSPITAL_COMMUNITY): Payer: Self-pay

## 2020-09-22 DIAGNOSIS — R350 Frequency of micturition: Secondary | ICD-10-CM

## 2020-09-22 NOTE — Telephone Encounter (Signed)
Called VM was left

## 2020-09-22 NOTE — Telephone Encounter (Signed)
Spoke with patient today. Her health plan only allows #14/mnth of her Zolpidem 12.5mg  CR tablet. I had also spoken with the pharmacy and they stated that you cannot do an override. If you don't want to switch her to something else, then I can try to do a PA for #30. However, I need specific reasons as to why we need to exceed the quantity limit allowed by her plan. They will ask me that. Please review and advise. Thank you

## 2020-09-22 NOTE — Telephone Encounter (Signed)
Error

## 2020-09-22 NOTE — Telephone Encounter (Signed)
Pt was called Vm was left for Pt to call the office for a follow up visit with N Strouid

## 2020-09-23 ENCOUNTER — Other Ambulatory Visit: Payer: Self-pay

## 2020-09-23 ENCOUNTER — Telehealth (INDEPENDENT_AMBULATORY_CARE_PROVIDER_SITE_OTHER): Payer: Medicaid Other | Admitting: Psychiatry

## 2020-09-23 DIAGNOSIS — F5101 Primary insomnia: Secondary | ICD-10-CM

## 2020-09-23 NOTE — Telephone Encounter (Signed)
error 

## 2020-09-23 NOTE — Telephone Encounter (Signed)
I spoke to patient today.  We need to start prior authorization for Ambien CR 12.5 mg to take every night.  She had tried in the past hydroxyzine, trazodone, doxepin, temazepam, Saphris, Lunesta with poor outcome or having side effects.  She also agreed if prior authorization did not go through then she will get it out of her pocket.  At this time she does not want to change medication.  I also recommend to see a therapist in our office and she agreed with the plan.  Please schedule appointment with therapist.

## 2020-09-23 NOTE — Progress Notes (Signed)
Virtual Visit via Telephone Note  I connected with Erin Bradford on 87/86/76 at 10:00 AM EDT by telephone and verified that I am speaking with the correct person using two identifiers.  Location: Patient: home Provider: home office   I discussed the limitations, risks, security and privacy concerns of performing an evaluation and management service by telephone and the availability of in person appointments. I also discussed with the patient that there may be a patient responsible charge related to this service. The patient expressed understanding and agreed to proceed.   History of Present Illness: Patient requested an earlier appointment and session was done by phone.  She is experiencing poor sleep, racing thoughts, and dysphoria.  Her biggest issue is that her insurance do not improved Ambien CR to take every night and she is getting only 15 pills a month.  She is not sleeping for past few days.  We had tried in the past trazodone, hydroxyzine, Thorazine, Lunesta, doxepin, Saphris and temazepam but either they did not work or had any side effects.  Patient also struggling with fatigue, feeling tired and lack of energy.  She has not bought a scale and has not had a weight check in a while.  She is afraid to take any medicine that can cause weight gain.  She has lower back pain and she takes muscle relaxant, gabapentin, pain medicine.  Patient has multiple health issues.  She denies any suicidal thoughts or homicidal thoughts.  Recently she has seen her neurologist for tremors and her gabapentin and primidone was continued.  She lives with her daughter and she is pleased that daughter went back to school.  She denies any agitation, hallucination or any paranoia.   Past Psychiatric History: H/Odepression. No h/oinpatient,mania, psychosisor suicidal attempt. Tried Prozac which worked buthadweight gain. Tried Vistaril, trazodone, Thorazine,Lunesta3 mg,Doxepin 50 mg and saphris 5 mg  with poor outcome.Lamictal caused rashand temazepam caused headache.   Psychiatric Specialty Exam: Physical Exam  Review of Systems  Last menstrual period 09/17/2017.There is no height or weight on file to calculate BMI.  General Appearance: NA  Eye Contact:  NA  Speech:  Slow  Volume:  Decreased  Mood:  Dysphoric  Affect:  NA  Thought Process:  Descriptions of Associations: Intact  Orientation:  Full (Time, Place, and Person)  Thought Content:  Rumination  Suicidal Thoughts:  No  Homicidal Thoughts:  No  Memory:  Immediate;   Good Recent;   Good Remote;   Fair  Judgement:  Intact  Insight:  Shallow  Psychomotor Activity:  Tremor  Concentration:  Concentration: Fair and Attention Span: Fair  Recall:  Good  Fund of Knowledge:  Good  Language:  Good  Akathisia:  No  Handed:  Right  AIMS (if indicated):     Assets:  Communication Skills Desire for Improvement Resilience  ADL's:  Intact  Cognition:  WNL  Sleep:   poor        Assessment and Plan: Primary insomnia.  Depressive disorder, recurrent.  Discussed her chronic insomnia.  Patient like to continue Ambien CR 12.5 since that works very well and she had tried other medicine which either did not work or has any side effects.  I explained that we can try prior authorization if she preferred to continue Ambien CR 12.5 and if they do not improve then she may need to consider paying out of pocket.  She agreed with the plan.  She does not want to change medication that caused weight  gain.  She is taking narcotic pain medicine and muscle relaxant for her back.  I encouraged to have her weight check and get blood work from her PCP.  I also talked about seeing a therapist to help her coping skills and she agreed to consider it.  We will schedule appointment with a therapist in our office.  We will try prior authorization to get her Ambien CR however she agreed if not approved and she will get out of her pocket.  I recommend to  keep appointment in November.  Recommend to call us back if she has any question or any concern.    Follow Up Instructions:    I discussed the assessment and treatment plan with the patient. The patient was provided an opportunity to ask questions and all were answered. The patient agreed with the plan and demonstrated an understanding of the instructions.   The patient was advised to call back or seek an in-person evaluation if the symptoms worsen or if the condition fails to improve as anticipated.  I provided 16 minutes of non-face-to-face time during this encounter.   [name], MD

## 2020-09-24 ENCOUNTER — Telehealth (HOSPITAL_COMMUNITY): Payer: Self-pay

## 2020-09-24 NOTE — Telephone Encounter (Signed)
I spoke with [employer] [place] again regarding patient's Zolpidem 12.5mg  CR tablet. I tried to do a PA on the medication and they stated that this medication is non-formulary and NOT a covered drug. They stated that patient needs to switch to something else (which I know is not an option) or pay out of pocket

## 2020-09-24 NOTE — Telephone Encounter (Signed)
We discussed yesterday and she is aware that she needs to pay out of pocket. She tried at least 5 medication for sleep with poor result and it is documented in chart. Thanks

## 2020-09-24 NOTE — Telephone Encounter (Signed)
Saw documentation but still won't cover medication

## 2020-09-24 NOTE — Telephone Encounter (Signed)
I called and notified patient that I did a PA on her Zolpidem 12.5mg  CR #30. Notified her that it would not go through due to a non-formulary/non-covered drug on her insurance for 30.  Quantity limit of 15/mnth only. Pt. Expressed understanding and going to stay with the allowed quantity

## 2020-09-29 ENCOUNTER — Ambulatory Visit (INDEPENDENT_AMBULATORY_CARE_PROVIDER_SITE_OTHER): Payer: Medicaid Other | Admitting: Clinical

## 2020-09-29 DIAGNOSIS — Z87828 Personal history of other (healed) physical injury and trauma: Secondary | ICD-10-CM

## 2020-09-29 DIAGNOSIS — F330 Major depressive disorder, recurrent, mild: Secondary | ICD-10-CM
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
## 2020-10-13 ENCOUNTER — Ambulatory Visit (HOSPITAL_COMMUNITY): Payer: Medicaid Other | Admitting: Clinical

## 2020-10-13 ENCOUNTER — Other Ambulatory Visit: Payer: Self-pay

## 2020-10-14 ENCOUNTER — Ambulatory Visit (INDEPENDENT_AMBULATORY_CARE_PROVIDER_SITE_OTHER): Payer: Medicaid Other | Admitting: Clinical

## 2020-10-14 ENCOUNTER — Other Ambulatory Visit: Payer: Self-pay

## 2020-10-14 DIAGNOSIS — F330 Major depressive disorder, recurrent, mild: Secondary | ICD-10-CM | POA: Diagnosis not present

## 2020-10-14 DIAGNOSIS — Z87828 Personal history of other (healed) physical injury and trauma: Secondary | ICD-10-CM | POA: Diagnosis not present

## 2020-10-14 NOTE — Progress Notes (Signed)
THERAPIST PROGRESS NOTE  Session Time: 3pm  Participation Level: Active  Behavioral Response: CasualAlertEuthymic  Type of Therapy: Individual Therapy  Treatment Goals addressed: Pt participated in the completion of the treatment plan and identified goals she would like to work on.  I connected with Noreene , on 93/23/55 at 3:00pm  by video enabled telemedicine application and verified that I am speaking with the correct person using two identifiers.  I discussed the limitations, risks, security and privacy concerns of performing an evaluation and management service by video and the availability of in person appointments. I also discussed with the patient that there may be a patient responsible charge related to this service. The patient expressed understanding and agreed to proceed.  I discussed the assessment and treatment plan with the patient. The patient was provided an opportunity to ask questions and all were answered. The patient agreed with the plan and demonstrated an understanding of the instructions.  The patient was advised to call back or seek an in-person evaluation if the symptoms worsen or if the condition fails to improve as anticipated.  I provided 50 min of non-face-to-face time during this encounter. Patient: Pt home Provider: [facility]  Interventions: Advertising copywriter, completed on 10/14/2020 Meet with clinician virtually, face to face, via phone on weekly or biweekly basis for therapy sessions to assess progress towards personal goals and receive assistance/feedback as needed to address barriers to success.  Develop healthy coping methods to reduce/eliminate trauma related sxs to 4/7 days per week.  Develop and implement anxiety and depression management techniques with goal of reducing symptoms to 4/7 days per week. Develop and implement positive communication skills 4/7 days per week to improve relationship with  others. Develop and implement self care routine that promotes feelings of optimal health.  Summary: GEANNA DIVIRGILIO is a 49 y.o. female who presents with a history of depression and trauma. Pt appeared to be distracted by phone ringing multiple times during session. Pt participated in the completion of treatment plan. When asked what has gone well since last visit, pt states she will have sleep aid medication refilled on 10/23. Pt discussed difficulty falling asleep, stating she has been going to sleep at 2am and taking naps during the day. Pt says tonight she plans to use meditation to aid in her getting rest and says this has worked for her in the past. When asked about challenges she has been experiencing pt reports difficulty finding a work life balance. Pt admits to eliminating pleasurable activities due to busy work schedule. Pt discussed having one of her daughter's acknowledge her growth as it relates to how she communicates with others. Pt states getting into a disagreement with one of her daughter's but not allowing it to consume her.    Suicidal/Homicidal: Pt denies plan to harm herself or others. Pt encouraged to call 911 or go to closest emergency department in the event of an emergency.   Therapist Response: CSW assisted pt in completion of treatment plan. CSW aided pt in identifying pleasurable activities she enjoys doing to promote optimal health during stressful times. CSW modeled for pt assertiveness techniques assist her in improving relationship with her daughter. CSW assess for changes in daily functioning, mood and behavior.  Plan: Return again in 2 weeks.  Diagnosis: Axis I: Major depressive disorder, recurrent, mild    History of trauma    Axis II: No diagnosis    Yvette Rack, LCSW 10/14/2020

## 2020-10-24 ENCOUNTER — Telehealth (HOSPITAL_COMMUNITY): Payer: Self-pay

## 2020-10-24 NOTE — Telephone Encounter (Signed)
HE[employer] BLUE PRESCRIPTION COVERAGE APPROVED  ZOLPIDEM TARTRATE ER 12.5 MG TABLET PA # [id number] EFFECTIVE 10/22/2020 TO 10/22/2021  S/W [facility] INFORMED PHARMACY & PATIENT

## 2020-10-27 ENCOUNTER — Ambulatory Visit (HOSPITAL_COMMUNITY): Payer: Medicaid Other | Admitting: Clinical

## 2020-10-27 ENCOUNTER — Other Ambulatory Visit: Payer: Self-pay

## 2020-10-27 DIAGNOSIS — Z87828 Personal history of other (healed) physical injury and trauma: Secondary | ICD-10-CM

## 2020-10-27 DIAGNOSIS — F330 Major depressive disorder, recurrent, mild: Secondary | ICD-10-CM

## 2020-10-27 NOTE — Progress Notes (Signed)
Patient ID: Erin Bradford, female   DOB: Sep 05, 1971, 49 y.o.   MRN: [id number] Pt request session to be rescheduled due to conflict with school schedule.

## 2020-11-04 ENCOUNTER — Other Ambulatory Visit: Payer: Self-pay

## 2020-11-04 ENCOUNTER — Telehealth: Payer: Self-pay | Admitting: Cardiology

## 2020-11-04 DIAGNOSIS — M5136 Other intervertebral disc degeneration, lumbar region: Secondary | ICD-10-CM | POA: Diagnosis not present

## 2020-11-04 MED ORDER — CARVEDILOL 12.5 MG PO TABS: 18.7500 mg | ORAL_TABLET | Freq: Two times a day (BID) | ORAL | 0 refills | Status: DC

## 2020-11-04 NOTE — Telephone Encounter (Signed)
Reve is calling stating the pharmacy advised her she is needing prior authorization for carvedilol (COREG) 12.5 MG tablet. She is requesting a new prescription for a 90 day supply be sent once the prior authorization is completed to the pharmacy listed on her chart. Please advise.

## 2020-11-05 NOTE — Telephone Encounter (Signed)
Called pharmacy to clarify prior auth request.  Request is not for Carvedilo, but her Oxycodone. Called pt and informed her, she reports that she did reach out to prescribing MD of her Oxycodone for this. Aware she should have refill for her Carvedilol Will forward to EP scheduler to arrange overdue follow up with Dr. Curt Bears.

## 2020-11-12 ENCOUNTER — Telehealth (INDEPENDENT_AMBULATORY_CARE_PROVIDER_SITE_OTHER): Payer: Medicaid Other | Admitting: Psychiatry

## 2020-11-12 ENCOUNTER — Other Ambulatory Visit: Payer: Self-pay

## 2020-11-12 ENCOUNTER — Encounter (HOSPITAL_COMMUNITY): Payer: Self-pay | Admitting: Psychiatry

## 2020-11-12 DIAGNOSIS — F5101 Primary insomnia: Secondary | ICD-10-CM

## 2020-11-12 DIAGNOSIS — F330 Major depressive disorder, recurrent, mild: Secondary | ICD-10-CM

## 2020-11-12 MED ORDER — SERTRALINE HCL 100 MG PO TABS: 100.0000 mg | ORAL_TABLET | Freq: Every day | ORAL | 0 refills | Status: DC

## 2020-11-12 MED ORDER — ZOLPIDEM TARTRATE ER 12.5 MG PO TBCR: 12.5000 mg | EXTENDED_RELEASE_TABLET | Freq: Every evening | ORAL | 2 refills | Status: DC | PRN

## 2020-11-12 MED ORDER — BUPROPION HCL ER (XL) 300 MG PO TB24: 300.0000 mg | ORAL_TABLET | Freq: Every day | ORAL | 0 refills | Status: DC

## 2020-11-12 NOTE — Progress Notes (Signed)
Virtual Visit via Telephone Note  I connected with Erin Bradford on [birth year] at  4:20 PM EST by telephone and verified that I am speaking with the correct person using two identifiers.  Location: Patient: Home Provider: Home Office   I discussed the limitations, risks, security and privacy concerns of performing an evaluation and management service by telephone and the availability of in person appointments. I also discussed with the patient that there may be a patient responsible charge related to this service. The patient expressed understanding and agreed to proceed.   History of Present Illness: Patient is evaluated by phone session.  She is doing better since her Ambien prescription is improved after prior authorization.  She is sleeping much better.  She also started therapy with [name] in our office.  She is more relaxed and denies any anxiety, crying spells or any feeling of hopelessness.  Her energy level is good.  She is having ablation treatment 1 day before Thanksgiving for her back.  She is hoping after that her pain get better.  She admitted weight gain because she does not mobilize as much but hoping once pain get better then she can start walking.  She has no tremors, shakes or any EPS.  She denies any suicidal thoughts.  She lives with her daughter.  She wants to keep her current medication which she feels working very well.  She has a plan to visit her mother on Thanksgiving.    Past Psychiatric History: H/Odepression. No h/oinpatient,mania, psychosisor suicidal attempt. Tried Prozac which worked buthadweight gain. Tried Vistaril, trazodone, Thorazine,Lunesta3 mg,Doxepin 50 mg and saphris 5 mg with poor outcome.Lamictal caused rashand temazepam caused headache.   Psychiatric Specialty Exam: Physical Exam  Review of Systems  Weight 260 lb (117.9 kg), last menstrual period 09/17/2017.There is no height or weight on file to calculate BMI.  General  Appearance: NA  Eye Contact:  NA  Speech:  Slow  Volume:  Normal  Mood:  Euthymic  Affect:  NA  Thought Process:  Goal Directed  Orientation:  Full (Time, Place, and Person)  Thought Content:  WDL  Suicidal Thoughts:  No  Homicidal Thoughts:  No  Memory:  Immediate;   Good Recent;   Good Remote;   Good  Judgement:  Intact  Insight:  Present  Psychomotor Activity:  NA  Concentration:  Concentration: Fair and Attention Span: Fair  Recall:  Good  Fund of Knowledge:  Good  Language:  Good  Akathisia:  No  Handed:  Right  AIMS (if indicated):     Assets:  Communication Skills Desire for Improvement Resilience  ADL's:  Intact  Cognition:  WNL  Sleep:   better      Assessment and Plan: Major depressive disorder, recurrent.  Primary insomnia.  Patient doing better since her Ambien CR 12.5 mg approved after prior authorization.  She is sleeping better.  She wants to continue her other medication which is Zoloft 100 mg daily and Wellbutrin XL 300 mg daily.  She is also taking gabapentin from her pain specialist for back pain.  Discussed medication side effects and benefits.  Recommended to call us back if she has any question or any concern.  She started therapy with [name] and that is going well.  Encouraged to keep that appointment.  Follow-up plan 3 months.  Follow Up Instructions:    I discussed the assessment and treatment plan with the patient. The patient was provided an opportunity to ask questions and  all were answered. The patient agreed with the plan and demonstrated an understanding of the instructions.   The patient was advised to call back or seek an in-person evaluation if the symptoms worsen or if the condition fails to improve as anticipated.  I provided 10 minutes of non-face-to-face time during this encounter.   Erin Nations, MD

## 2020-11-13 ENCOUNTER — Other Ambulatory Visit: Payer: Self-pay | Admitting: Neurology

## 2020-12-25 ENCOUNTER — Ambulatory Visit: Payer: Medicaid Other | Admitting: Cardiology

## 2021-01-01 DIAGNOSIS — G8929 Other chronic pain: Secondary | ICD-10-CM | POA: Diagnosis not present

## 2021-01-01 DIAGNOSIS — Z6841 Body Mass Index (BMI) 40.0 and over, adult: Secondary | ICD-10-CM | POA: Diagnosis not present

## 2021-01-01 DIAGNOSIS — E559 Vitamin D deficiency, unspecified: Secondary | ICD-10-CM | POA: Diagnosis not present

## 2021-01-01 DIAGNOSIS — F419 Anxiety disorder, unspecified: Secondary | ICD-10-CM | POA: Diagnosis not present

## 2021-01-01 DIAGNOSIS — E876 Hypokalemia: Secondary | ICD-10-CM | POA: Diagnosis not present

## 2021-01-01 DIAGNOSIS — D649 Anemia, unspecified: Secondary | ICD-10-CM | POA: Diagnosis not present

## 2021-01-01 DIAGNOSIS — R7989 Other specified abnormal findings of blood chemistry: Secondary | ICD-10-CM | POA: Diagnosis not present

## 2021-01-01 DIAGNOSIS — I129 Hypertensive chronic kidney disease with stage 1 through stage 4 chronic kidney disease, or unspecified chronic kidney disease: Secondary | ICD-10-CM | POA: Diagnosis not present

## 2021-01-18 NOTE — Progress Notes (Deleted)
Cardiology Office Note Date:  01/18/2021  Patient ID:  Erin, Bradford 04-18-1971, MRN [id number] PCP:  Azzie Glatter, FNP  Electrophysiologist: Dr. Curt Bears  ***refresh   Chief Complaint: *** annual visit  History of Present Illness: Erin Bradford is a 50 y.o. female with history of HTN, obesity, palpitations  She comes in today to be seen for Dr. Curt Bears, last seen by him via tele health Nov 2020, at that time, had c/o ongoing palpitations and some CP, c/o fatigue.  Suspect fatigue may be 2/2 her coreg though planned for echo and coronary CT prior to any changes  TTE noted LVEF 60-65%, mod elevated pulm pressures (49) I do not find CT was completed  *** symptoms, CP *** CP?? *** palps?  Past Medical History:  Diagnosis Date  . Abnormal laboratory test   . Acid reflux   . Allergy   . Anemia   . Anxiety   . Arthritis   . Blood transfusion without reported diagnosis   . BMI 39.0-39.9,adult   . Chronic hypokalemia   . Chronic kidney disease    mild  . Depression   . GERD (gastroesophageal reflux disease)   . H/O: hysterectomy   . Heart murmur   . Hypertension   . Insomnia   . Low back pain   . Obesity   . Occasional tremors   . Palpitations    dx with tachycardia   . PONV (postoperative nausea and vomiting)   . Tiredness   . Urinary frequency     Past Surgical History:  Procedure Laterality Date  . CESAREAN SECTION    . TOOTH EXTRACTION    . TOTAL LAPAROSCOPIC HYSTERECTOMY WITH SALPINGECTOMY Bilateral 10/06/2017   Procedure: ATTEMPTED HYSTERECTOMY TOTAL LAPAROSCOPIC WITH SALPINGECTOMY CONVERTED TO OPEN ABDOMINAL TOTAL HYSTERECTOMY WITH SALPINGECTOMY;  Surgeon: Sherlyn Hay, DO;  Location: [facility];  Service: Gynecology;  Laterality: Bilateral;    Current Outpatient Medications  Medication Sig Dispense Refill  . albuterol (PROVENTIL) (2.5 MG/3ML) 0.083% nebulizer solution Take 3 mLs (2.5 mg total) by nebulization  every 6 (six) hours as needed for wheezing or shortness of breath. 75 mL 12  . albuterol (VENTOLIN HFA) 108 (90 Base) MCG/ACT inhaler Inhale 2 puffs into the lungs every 6 (six) hours as needed for wheezing or shortness of breath. 8 g 12  . AMITIZA 24 MCG capsule     . buPROPion (WELLBUTRIN XL) 300 MG 24 hr tablet Take 1 tablet (300 mg total) by mouth daily. 90 tablet 0  . carvedilol (COREG) 12.5 MG tablet Take 1.5 tablets (18.75 mg total) by mouth 2 (two) times daily. Please make overdue appt with Dr. Curt Bears before anymore refills. Thank you 1st attempt 90 tablet 0  . cetirizine (ZYRTEC) 10 MG tablet TAKE 1 TABLET(10 MG) BY MOUTH AT BEDTIME AS NEEDED FOR ALLERGIES 30 tablet 1  . famotidine (PEPCID) 20 MG tablet TAKE 1 TABLET(20 MG) BY MOUTH TWICE DAILY 60 tablet 3  . fluticasone (FLONASE) 50 MCG/ACT nasal spray SHAKE LIQUID AND USE 2 SPRAYS IN EACH NOSTRIL DAILY 48 g 3  . furosemide (LASIX) 20 MG tablet Take 20 mg by mouth 2 (two) times daily.     Marland Kitchen gabapentin (NEURONTIN) 300 MG capsule Take 300 mg by mouth 3 (three) times daily.     . Glucose Blood (BLOOD GLUCOSE TEST STRIPS) STRP 2 (two) times a week.    . losartan (COZAAR) 100 MG tablet Take 100 mg by mouth daily.    Marland Kitchen  methocarbamol (ROBAXIN) 750 MG tablet Take 750 mg by mouth 3 (three) times daily.  5  . oxybutynin (DITROPAN XL) 10 MG 24 hr tablet Take 1 tablet (10 mg total) by mouth at bedtime. 30 tablet 6  . oxyCODONE-acetaminophen (PERCOCET) 10-325 MG tablet TK 1 T PO Q 6 H PRF PAIN    . primidone (MYSOLINE) 50 MG tablet TAKE 1 TABLET BY MOUTH EVERY MORNING AND 6 TABLETS BY MOUTH EVERY EVENING 210 tablet 10  . sertraline (ZOLOFT) 100 MG tablet Take 1 tablet (100 mg total) by mouth daily. 90 tablet 0  . spironolactone (ALDACTONE) 50 MG tablet Take 50 mg by mouth 2 (two) times daily.    Marland Kitchen zolpidem (AMBIEN CR) 12.5 MG CR tablet Take 1 tablet (12.5 mg total) by mouth at bedtime as needed for sleep. 30 tablet 2   No current  facility-administered medications for this visit.    Allergies:   Ace inhibitors   Social History:  The patient  reports that she has never smoked. She has never used smokeless tobacco. She reports that she does not drink alcohol and does not use drugs.   Family History:  The patient's family history includes Atrial fibrillation in her sister; Cancer in her maternal grandfather; Diabetes in her maternal grandmother and mother; Hyperlipidemia in her mother; Hypertension in her maternal grandmother and mother; Kidney disease in her father and maternal grandmother; Pancreatic cancer in her maternal grandmother; Sarcoidosis in her mother.***  ROS:  Please see the history of present illness.    All other systems are reviewed and otherwise negative.   PHYSICAL EXAM:  VS:  LMP 09/17/2017 (Approximate)  BMI: There is no height or weight on file to calculate BMI. Well nourished, well developed, in no acute distress HEENT: normocephalic, atraumatic Neck: no JVD, carotid bruits or masses Cardiac:  *** RRR; no significant murmurs, no rubs, or gallops Lungs:  *** CTA b/l, no wheezing, rhonchi or rales Abd: soft, nontender MS: no deformity or *** atrophy Ext: *** no edema Skin: warm and dry, no rash Neuro:  No gross deficits appreciated Psych: euthymic mood, full affect    EKG:  Done today and reviewed by myself shows  ***   11/26/2019: TTE IMPRESSIONS  1. Left ventricular ejection fraction, by visual estimation, is 60 to  65%. The left ventricle has normal function. There is no left ventricular  hypertrophy.  2. Global right ventricle has normal systolic function.The right  ventricular size is normal. No increase in right ventricular wall  thickness.  3. Left atrial size was normal.  4. Right atrial size was normal.  5. The mitral valve is normal in structure. Trace mitral valve  regurgitation. No evidence of mitral stenosis.  6. The tricuspid valve is normal in structure.  Tricuspid valve  regurgitation is not demonstrated.  7. The aortic valve is normal in structure. Aortic valve regurgitation is  not visualized. No evidence of aortic valve sclerosis or stenosis.  8. The pulmonic valve was normal in structure. Pulmonic valve  regurgitation is not visualized.  9. Moderately elevated pulmonary artery systolic pressure.  10. The inferior vena cava is normal in size with greater than 50%  respiratory variability, suggesting right atrial pressure of 3 mmHg.   Dec 2017: monitor Sinus rhythm and sinus tachycardia All symptoms associated with sinus rhythm and sinus tachycardia No arrhythmias seen Zero atrial fibrillation   Recent Labs: No results found for requested labs within last 8760 hours.  No results found for requested  labs within last 8760 hours.   CrCl cannot be calculated (Patient's most recent lab result is older than the maximum 21 days allowed.).   Wt Readings from Last 3 Encounters:  11/16/19 250 lb (113.4 kg)  09/27/19 230 lb (104.3 kg)  03/28/19 235 lb (106.6 kg)     Other studies reviewed: Additional studies/records reviewed today include: summarized above  ASSESSMENT AND PLAN:  1. HTN     ***  2. Palpitations     Negative monitoring 2017     *** coreg  3. CP     ***  Disposition: F/u with ***  Current medicines are reviewed at length with the patient today.  The patient did not have any concerns regarding medicines.  Venetia Night, PA-C 01/18/2021 9:42 AM     [place] [address] [phone] (office)  [phone] (fax)

## 2021-01-21 ENCOUNTER — Ambulatory Visit: Payer: Medicaid Other | Admitting: Physician Assistant

## 2021-02-01 NOTE — Progress Notes (Deleted)
Cardiology Office Note Date:  02/01/2021  Patient ID:  Erin, Bradford [birth year], MRN [id number] PCP:  Azzie Glatter, FNP  Electrophysiologist: Dr. Curt Bears  ***refresh   Chief Complaint: *** annual visit  History of Present Illness: Erin Bradford is a 50 y.o. female with history of HTN, obesity, palpitations  She comes in today to be seen for Dr. Curt Bears, last seen by him via tele health Nov 2020, at that time, had c/o ongoing palpitations and some CP, c/o fatigue.  Suspect fatigue may be 2/2 her coreg though planned for echo and coronary CT prior to any changes  TTE noted LVEF 60-65%, mod elevated pulm pressures (49) I do not find CT was completed  *** symptoms, CP *** CP?? *** palps?  Past Medical History:  Diagnosis Date  . Abnormal laboratory test   . Acid reflux   . Allergy   . Anemia   . Anxiety   . Arthritis   . Blood transfusion without reported diagnosis   . BMI 39.0-39.9,adult   . Chronic hypokalemia   . Chronic kidney disease    mild  . Depression   . GERD (gastroesophageal reflux disease)   . H/O: hysterectomy   . Heart murmur   . Hypertension   . Insomnia   . Low back pain   . Obesity   . Occasional tremors   . Palpitations    dx with tachycardia   . PONV (postoperative nausea and vomiting)   . Tiredness   . Urinary frequency     Past Surgical History:  Procedure Laterality Date  . CESAREAN SECTION    . TOOTH EXTRACTION    . TOTAL LAPAROSCOPIC HYSTERECTOMY WITH SALPINGECTOMY Bilateral 10/06/2017   Procedure: ATTEMPTED HYSTERECTOMY TOTAL LAPAROSCOPIC WITH SALPINGECTOMY CONVERTED TO OPEN ABDOMINAL TOTAL HYSTERECTOMY WITH SALPINGECTOMY;  Surgeon: Sherlyn Hay, DO;  Location: [facility];  Service: Gynecology;  Laterality: Bilateral;    Current Outpatient Medications  Medication Sig Dispense Refill  . albuterol (PROVENTIL) (2.5 MG/3ML) 0.083% nebulizer solution Take 3 mLs (2.5 mg total) by nebulization  every 6 (six) hours as needed for wheezing or shortness of breath. 75 mL 12  . albuterol (VENTOLIN HFA) 108 (90 Base) MCG/ACT inhaler Inhale 2 puffs into the lungs every 6 (six) hours as needed for wheezing or shortness of breath. 8 g 12  . AMITIZA 24 MCG capsule     . buPROPion (WELLBUTRIN XL) 300 MG 24 hr tablet Take 1 tablet (300 mg total) by mouth daily. 90 tablet 0  . carvedilol (COREG) 12.5 MG tablet Take 1.5 tablets (18.75 mg total) by mouth 2 (two) times daily. Please make overdue appt with Dr. Curt Bears before anymore refills. Thank you 1st attempt 90 tablet 0  . cetirizine (ZYRTEC) 10 MG tablet TAKE 1 TABLET(10 MG) BY MOUTH AT BEDTIME AS NEEDED FOR ALLERGIES 30 tablet 1  . famotidine (PEPCID) 20 MG tablet TAKE 1 TABLET(20 MG) BY MOUTH TWICE DAILY 60 tablet 3  . fluticasone (FLONASE) 50 MCG/ACT nasal spray SHAKE LIQUID AND USE 2 SPRAYS IN EACH NOSTRIL DAILY 48 g 3  . furosemide (LASIX) 20 MG tablet Take 20 mg by mouth 2 (two) times daily.     Marland Kitchen gabapentin (NEURONTIN) 300 MG capsule Take 300 mg by mouth 3 (three) times daily.     . Glucose Blood (BLOOD GLUCOSE TEST STRIPS) STRP 2 (two) times a week.    . losartan (COZAAR) 100 MG tablet Take 100 mg by mouth daily.    Marland Kitchen  methocarbamol (ROBAXIN) 750 MG tablet Take 750 mg by mouth 3 (three) times daily.  5  . oxybutynin (DITROPAN XL) 10 MG 24 hr tablet Take 1 tablet (10 mg total) by mouth at bedtime. 30 tablet 6  . oxyCODONE-acetaminophen (PERCOCET) 10-325 MG tablet TK 1 T PO Q 6 H PRF PAIN    . primidone (MYSOLINE) 50 MG tablet TAKE 1 TABLET BY MOUTH EVERY MORNING AND 6 TABLETS BY MOUTH EVERY EVENING 210 tablet 10  . sertraline (ZOLOFT) 100 MG tablet Take 1 tablet (100 mg total) by mouth daily. 90 tablet 0  . spironolactone (ALDACTONE) 50 MG tablet Take 50 mg by mouth 2 (two) times daily.    Marland Kitchen zolpidem (AMBIEN CR) 12.5 MG CR tablet Take 1 tablet (12.5 mg total) by mouth at bedtime as needed for sleep. 30 tablet 2   No current  facility-administered medications for this visit.    Allergies:   Ace inhibitors   Social History:  The patient  reports that she has never smoked. She has never used smokeless tobacco. She reports that she does not drink alcohol and does not use drugs.   Family History:  The patient's family history includes Atrial fibrillation in her sister; Cancer in her maternal grandfather; Diabetes in her maternal grandmother and mother; Hyperlipidemia in her mother; Hypertension in her maternal grandmother and mother; Kidney disease in her father and maternal grandmother; Pancreatic cancer in her maternal grandmother; Sarcoidosis in her mother.***  ROS:  Please see the history of present illness.    All other systems are reviewed and otherwise negative.   PHYSICAL EXAM:  VS:  LMP 09/17/2017 (Approximate)  BMI: There is no height or weight on file to calculate BMI. Well nourished, well developed, in no acute distress HEENT: normocephalic, atraumatic Neck: no JVD, carotid bruits or masses Cardiac:  *** RRR; no significant murmurs, no rubs, or gallops Lungs:  *** CTA b/l, no wheezing, rhonchi or rales Abd: soft, nontender MS: no deformity or *** atrophy Ext: *** no edema Skin: warm and dry, no rash Neuro:  No gross deficits appreciated Psych: euthymic mood, full affect    EKG:  Done today and reviewed by myself shows  ***   11/26/2019: TTE IMPRESSIONS  1. Left ventricular ejection fraction, by visual estimation, is 60 to  65%. The left ventricle has normal function. There is no left ventricular  hypertrophy.  2. Global right ventricle has normal systolic function.The right  ventricular size is normal. No increase in right ventricular wall  thickness.  3. Left atrial size was normal.  4. Right atrial size was normal.  5. The mitral valve is normal in structure. Trace mitral valve  regurgitation. No evidence of mitral stenosis.  6. The tricuspid valve is normal in structure.  Tricuspid valve  regurgitation is not demonstrated.  7. The aortic valve is normal in structure. Aortic valve regurgitation is  not visualized. No evidence of aortic valve sclerosis or stenosis.  8. The pulmonic valve was normal in structure. Pulmonic valve  regurgitation is not visualized.  9. Moderately elevated pulmonary artery systolic pressure.  10. The inferior vena cava is normal in size with greater than 50%  respiratory variability, suggesting right atrial pressure of 3 mmHg.   Dec 2017: monitor Sinus rhythm and sinus tachycardia All symptoms associated with sinus rhythm and sinus tachycardia No arrhythmias seen Zero atrial fibrillation   Recent Labs: No results found for requested labs within last 8760 hours.  No results found for requested  labs within last 8760 hours.   CrCl cannot be calculated (Patient's most recent lab result is older than the maximum 21 days allowed.).   Wt Readings from Last 3 Encounters:  11/16/19 250 lb (113.4 kg)  09/27/19 230 lb (104.3 kg)  03/28/19 235 lb (106.6 kg)     Other studies reviewed: Additional studies/records reviewed today include: summarized above  ASSESSMENT AND PLAN:  1. HTN     ***  2. Palpitations     Negative monitoring 2017     *** coreg  3. CP     ***  Disposition: F/u with ***  Current medicines are reviewed at length with the patient today.  The patient did not have any concerns regarding medicines.  Venetia Night, PA-C 02/01/2021 11:10 AM     [facility] [address] [place] [address] [phone] (office)  [phone] (fax)

## 2021-02-03 ENCOUNTER — Ambulatory Visit: Payer: Medicaid Other | Admitting: Physician Assistant

## 2021-02-05 DIAGNOSIS — M7918 Myalgia, other site: Secondary | ICD-10-CM | POA: Diagnosis not present

## 2021-02-05 NOTE — Progress Notes (Deleted)
Cardiology Office Note Date:  02/05/2021  Patient ID:  Erin Bradford, Erin Bradford October 23, 1971, MRN [id number] PCP:  Azzie Glatter, FNP  Electrophysiologist: Dr. Curt Bears  ***refresh   Chief Complaint: *** annual visit  History of Present Illness: Erin Bradford is a 50 y.o. female with history of HTN, obesity, palpitations  She comes in today to be seen for Dr. Curt Bears, last seen by him via tele health Nov 2020, at that time, had c/o ongoing palpitations and some CP, c/o fatigue.  Suspect fatigue may be 2/2 her coreg though planned for echo and coronary CT prior to any changes  TTE noted LVEF 60-65%, mod elevated pulm pressures (49) I do not find CT was completed  *** symptoms, CP *** CP?? *** palps?  Past Medical History:  Diagnosis Date  . Abnormal laboratory test   . Acid reflux   . Allergy   . Anemia   . Anxiety   . Arthritis   . Blood transfusion without reported diagnosis   . BMI 39.0-39.9,adult   . Chronic hypokalemia   . Chronic kidney disease    mild  . Depression   . GERD (gastroesophageal reflux disease)   . H/O: hysterectomy   . Heart murmur   . Hypertension   . Insomnia   . Low back pain   . Obesity   . Occasional tremors   . Palpitations    dx with tachycardia   . PONV (postoperative nausea and vomiting)   . Tiredness   . Urinary frequency     Past Surgical History:  Procedure Laterality Date  . CESAREAN SECTION    . TOOTH EXTRACTION    . TOTAL LAPAROSCOPIC HYSTERECTOMY WITH SALPINGECTOMY Bilateral 10/06/2017   Procedure: ATTEMPTED HYSTERECTOMY TOTAL LAPAROSCOPIC WITH SALPINGECTOMY CONVERTED TO OPEN ABDOMINAL TOTAL HYSTERECTOMY WITH SALPINGECTOMY;  Surgeon: Sherlyn Hay, DO;  Location: [facility];  Service: Gynecology;  Laterality: Bilateral;    Current Outpatient Medications  Medication Sig Dispense Refill  . albuterol (PROVENTIL) (2.5 MG/3ML) 0.083% nebulizer solution Take 3 mLs (2.5 mg total) by nebulization  every 6 (six) hours as needed for wheezing or shortness of breath. 75 mL 12  . albuterol (VENTOLIN HFA) 108 (90 Base) MCG/ACT inhaler Inhale 2 puffs into the lungs every 6 (six) hours as needed for wheezing or shortness of breath. 8 g 12  . AMITIZA 24 MCG capsule     . buPROPion (WELLBUTRIN XL) 300 MG 24 hr tablet Take 1 tablet (300 mg total) by mouth daily. 90 tablet 0  . carvedilol (COREG) 12.5 MG tablet Take 1.5 tablets (18.75 mg total) by mouth 2 (two) times daily. Please make overdue appt with Dr. Curt Bears before anymore refills. Thank you 1st attempt 90 tablet 0  . cetirizine (ZYRTEC) 10 MG tablet TAKE 1 TABLET(10 MG) BY MOUTH AT BEDTIME AS NEEDED FOR ALLERGIES 30 tablet 1  . famotidine (PEPCID) 20 MG tablet TAKE 1 TABLET(20 MG) BY MOUTH TWICE DAILY 60 tablet 3  . fluticasone (FLONASE) 50 MCG/ACT nasal spray SHAKE LIQUID AND USE 2 SPRAYS IN EACH NOSTRIL DAILY 48 g 3  . furosemide (LASIX) 20 MG tablet Take 20 mg by mouth 2 (two) times daily.     Marland Kitchen gabapentin (NEURONTIN) 300 MG capsule Take 300 mg by mouth 3 (three) times daily.     . Glucose Blood (BLOOD GLUCOSE TEST STRIPS) STRP 2 (two) times a week.    . losartan (COZAAR) 100 MG tablet Take 100 mg by mouth daily.    Marland Kitchen  methocarbamol (ROBAXIN) 750 MG tablet Take 750 mg by mouth 3 (three) times daily.  5  . oxybutynin (DITROPAN XL) 10 MG 24 hr tablet Take 1 tablet (10 mg total) by mouth at bedtime. 30 tablet 6  . oxyCODONE-acetaminophen (PERCOCET) 10-325 MG tablet TK 1 T PO Q 6 H PRF PAIN    . primidone (MYSOLINE) 50 MG tablet TAKE 1 TABLET BY MOUTH EVERY MORNING AND 6 TABLETS BY MOUTH EVERY EVENING 210 tablet 10  . sertraline (ZOLOFT) 100 MG tablet Take 1 tablet (100 mg total) by mouth daily. 90 tablet 0  . spironolactone (ALDACTONE) 50 MG tablet Take 50 mg by mouth 2 (two) times daily.    Marland Kitchen zolpidem (AMBIEN CR) 12.5 MG CR tablet Take 1 tablet (12.5 mg total) by mouth at bedtime as needed for sleep. 30 tablet 2   No current  facility-administered medications for this visit.    Allergies:   Ace inhibitors   Social History:  The patient  reports that she has never smoked. She has never used smokeless tobacco. She reports that she does not drink alcohol and does not use drugs.   Family History:  The patient's family history includes Atrial fibrillation in her sister; Cancer in her maternal grandfather; Diabetes in her maternal grandmother and mother; Hyperlipidemia in her mother; Hypertension in her maternal grandmother and mother; Kidney disease in her father and maternal grandmother; Pancreatic cancer in her maternal grandmother; Sarcoidosis in her mother.***  ROS:  Please see the history of present illness.    All other systems are reviewed and otherwise negative.   PHYSICAL EXAM:  VS:  LMP 09/17/2017 (Approximate)  BMI: There is no height or weight on file to calculate BMI. Well nourished, well developed, in no acute distress HEENT: normocephalic, atraumatic Neck: no JVD, carotid bruits or masses Cardiac:  *** RRR; no significant murmurs, no rubs, or gallops Lungs:  *** CTA b/l, no wheezing, rhonchi or rales Abd: soft, nontender MS: no deformity or *** atrophy Ext: *** no edema Skin: warm and dry, no rash Neuro:  No gross deficits appreciated Psych: euthymic mood, full affect    EKG:  Done today and reviewed by myself shows  ***   11/26/2019: TTE IMPRESSIONS  1. Left ventricular ejection fraction, by visual estimation, is 60 to  65%. The left ventricle has normal function. There is no left ventricular  hypertrophy.  2. Global right ventricle has normal systolic function.The right  ventricular size is normal. No increase in right ventricular wall  thickness.  3. Left atrial size was normal.  4. Right atrial size was normal.  5. The mitral valve is normal in structure. Trace mitral valve  regurgitation. No evidence of mitral stenosis.  6. The tricuspid valve is normal in structure.  Tricuspid valve  regurgitation is not demonstrated.  7. The aortic valve is normal in structure. Aortic valve regurgitation is  not visualized. No evidence of aortic valve sclerosis or stenosis.  8. The pulmonic valve was normal in structure. Pulmonic valve  regurgitation is not visualized.  9. Moderately elevated pulmonary artery systolic pressure.  10. The inferior vena cava is normal in size with greater than 50%  respiratory variability, suggesting right atrial pressure of 3 mmHg.   Dec 2017: monitor Sinus rhythm and sinus tachycardia All symptoms associated with sinus rhythm and sinus tachycardia No arrhythmias seen Zero atrial fibrillation   Recent Labs: No results found for requested labs within last 8760 hours.  No results found for requested  labs within last 8760 hours.   CrCl cannot be calculated (Patient's most recent lab result is older than the maximum 21 days allowed.).   Wt Readings from Last 3 Encounters:  11/16/19 250 lb (113.4 kg)  09/27/19 230 lb (104.3 kg)  03/28/19 235 lb (106.6 kg)     Other studies reviewed: Additional studies/records reviewed today include: summarized above  ASSESSMENT AND PLAN:  1. HTN     ***  2. Palpitations     Negative monitoring 2017     *** coreg  3. CP     ***  Disposition: F/u with ***  Current medicines are reviewed at length with the patient today.  The patient did not have any concerns regarding medicines.  Venetia Night, PA-C 02/05/2021 7:10 AM     [facility] [address] [place] [address] [phone] (office)  [phone] (fax)

## 2021-02-06 ENCOUNTER — Telehealth: Payer: Self-pay | Admitting: Cardiology

## 2021-02-06 ENCOUNTER — Ambulatory Visit: Payer: Medicaid Other | Admitting: Physician Assistant

## 2021-02-06 MED ORDER — CARVEDILOL 12.5 MG PO TABS: 18.7500 mg | ORAL_TABLET | Freq: Two times a day (BID) | ORAL | 0 refills | Status: DC

## 2021-02-06 NOTE — Telephone Encounter (Signed)
   *  STAT* If patient is at the pharmacy, call can be transferred to refill team.   1. Which medications need to be refilled? (please list name of each medication and dose if known) carvedilol (COREG) 12.5 MG tablet  2. Which pharmacy/location (including street and city if local pharmacy) is medication to be sent to? [facility] [place], [place] AT [address]  3. Do they need a 30 day or 90 day supply? 30 days

## 2021-02-06 NOTE — Telephone Encounter (Signed)
Pt's medication was sent to pt's pharmacy as requested. Confirmation received.  °

## 2021-02-10 ENCOUNTER — Other Ambulatory Visit: Payer: Self-pay

## 2021-02-10 ENCOUNTER — Telehealth (INDEPENDENT_AMBULATORY_CARE_PROVIDER_SITE_OTHER): Payer: Medicaid Other | Admitting: Psychiatry

## 2021-02-10 DIAGNOSIS — F330 Major depressive disorder, recurrent, mild: Secondary | ICD-10-CM | POA: Diagnosis not present

## 2021-02-10 DIAGNOSIS — F5101 Primary insomnia: Secondary | ICD-10-CM

## 2021-02-10 MED ORDER — BUPROPION HCL ER (XL) 300 MG PO TB24: 300.0000 mg | ORAL_TABLET | Freq: Every day | ORAL | 0 refills | Status: DC

## 2021-02-10 MED ORDER — SERTRALINE HCL 100 MG PO TABS: 100.0000 mg | ORAL_TABLET | Freq: Every day | ORAL | 0 refills | Status: DC

## 2021-02-10 MED ORDER — ZOLPIDEM TARTRATE ER 12.5 MG PO TBCR: 12.5000 mg | EXTENDED_RELEASE_TABLET | Freq: Every evening | ORAL | 2 refills | Status: DC | PRN

## 2021-02-10 NOTE — Progress Notes (Signed)
Virtual Visit via Telephone Note  I connected with Erin Bradford on 82/[birth year] at  2:40 PM EST by telephone and verified that I am speaking with the correct person using two identifiers.  Location: Patient: home Provider: home office   I discussed the limitations, risks, security and privacy concerns of performing an evaluation and management service by telephone and the availability of in person appointments. I also discussed with the patient that there may be a patient responsible charge related to this service. The patient expressed understanding and agreed to proceed.   History of Present Illness: Patient is evaluated by phone session.  She is taking Ambien, Zoloft and Wellbutrin.  She apologized not seeing a therapist for a while because she has been busy and not had a time to schedule appointment.  She recently saw physician and had blood work.  She is taking multiple medicine for blood pressure than now decided to have a healthy diet.  She cut down food intake and also eating better and healthier.  She is not sure what is her weight but the lost weight she had at her physician office was 235 but she is not sure it is correct.  Overall she described her sleep is much better with Ambien.  She denies any crying spells or any feeling of hopelessness or worthlessness.  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  She is hoping after bachelor she can do for year graduate program.  She lives with her daughter.  Sometimes she feels overwhelmed because of school but so far things are going well.  He does not want to change the medication.  Past Psychiatric History: H/Odepression. No h/oinpatient,mania, psychosisor suicidal attempt. Tried Prozac which worked buthadweight gain. Tried Vistaril, trazodone, Thorazine,Lunesta3 mg,Doxepin 50 mg and saphris 5 mg with poor outcome.Lamictal caused  rashand temazepam caused headache.  Psychiatric Specialty Exam: Physical Exam  Review of Systems  Last menstrual period 09/17/2017.There is no height or weight on file to calculate BMI.  General Appearance: NA  Eye Contact:  NA  Speech:  Slow  Volume:  Normal  Mood:  Dysphoric  Affect:  NA  Thought Process:  Goal Directed  Orientation:  Full (Time, Place, and Person)  Thought Content:  Rumination  Suicidal Thoughts:  No  Homicidal Thoughts:  No  Memory:  Immediate;   Good Recent;   Good Remote;   Fair  Judgement:  Fair  Insight:  Shallow  Psychomotor Activity:  NA  Concentration:  Concentration: Fair and Attention Span: Fair  Recall:  Good  Fund of Knowledge:  Good  Language:  Good  Akathisia:  No  Handed:  Right  AIMS (if indicated):     Assets:  Communication Skills Desire for Improvement Housing Resilience Transportation  ADL's:  Intact  Cognition:  WNL  Sleep:   ok      Assessment and Plan: Major depressive disorder, recurrent.  Primary insomnia.  Patient is stable on her current medication.  Continue Ambien CR 12.5 mg at bedtime, Zoloft 100 mg daily and Wellbutrin XL 300 mg daily.  She is also getting gabapentin, muscle relaxant and pain medicine from her pain management.  Discussed medication side effects and benefits.  Recommended to call us back if is any question or any concern.  Patient is not interested at this time for therapy since she is busy school and does not have a time to  schedule appointment with therapy.  Follow-up in 3 months.  Follow Up Instructions:    I discussed the assessment and treatment plan with the patient. The patient was provided an opportunity to ask questions and all were answered. The patient agreed with the plan and demonstrated an understanding of the instructions.   The patient was advised to call back or seek an in-person evaluation if the symptoms worsen or if the condition fails to improve as anticipated.  I provided  19 minutes of non-face-to-face time during this encounter.   [name], MD

## 2021-02-16 ENCOUNTER — Ambulatory Visit: Payer: Medicaid Other | Admitting: Cardiology

## 2021-02-16 NOTE — Progress Notes (Deleted)
Electrophysiology Office Note   Date:  04/04/8118   ID:  Erin Bradford, DOB 05-12-1971, MRN [id number]  PCP:  Azzie Glatter, FNP Primary Electrophysiologist:  Constance Haw, MD    No chief complaint on file.    History of Present Illness: Erin Bradford is a 50 y.o. female who presents today for electrophysiology evaluation.     She has a history of hypertension, obesity, palpitations, chronic hypokalemia.  Today, denies symptoms of palpitations, chest pain, shortness of breath, orthopnea, PND, lower extremity edema, claudication, dizziness, presyncope, syncope, bleeding, or neurologic sequela. The patient is tolerating medications without difficulties. ***    Past Medical History:  Diagnosis Date  . Abnormal laboratory test   . Acid reflux   . Allergy   . Anemia   . Anxiety   . Arthritis   . Blood transfusion without reported diagnosis   . BMI 39.0-39.9,adult   . Chronic hypokalemia   . Chronic kidney disease    mild  . Depression   . GERD (gastroesophageal reflux disease)   . H/O: hysterectomy   . Heart murmur   . Hypertension   . Insomnia   . Low back pain   . Obesity   . Occasional tremors   . Palpitations    dx with tachycardia   . PONV (postoperative nausea and vomiting)   . Tiredness   . Urinary frequency    Past Surgical History:  Procedure Laterality Date  . CESAREAN SECTION    . TOOTH EXTRACTION    . TOTAL LAPAROSCOPIC HYSTERECTOMY WITH SALPINGECTOMY Bilateral 10/06/2017   Procedure: ATTEMPTED HYSTERECTOMY TOTAL LAPAROSCOPIC WITH SALPINGECTOMY CONVERTED TO OPEN ABDOMINAL TOTAL HYSTERECTOMY WITH SALPINGECTOMY;  Surgeon: Sherlyn Hay, DO;  Location: [facility];  Service: Gynecology;  Laterality: Bilateral;     Current Outpatient Medications  Medication Sig Dispense Refill  . albuterol (PROVENTIL) (2.5 MG/3ML) 0.083% nebulizer solution Take 3 mLs (2.5 mg total) by nebulization every 6 (six) hours as  needed for wheezing or shortness of breath. 75 mL 12  . albuterol (VENTOLIN HFA) 108 (90 Base) MCG/ACT inhaler Inhale 2 puffs into the lungs every 6 (six) hours as needed for wheezing or shortness of breath. 8 g 12  . AMITIZA 24 MCG capsule     . buPROPion (WELLBUTRIN XL) 300 MG 24 hr tablet Take 1 tablet (300 mg total) by mouth daily. 90 tablet 0  . carvedilol (COREG) 12.5 MG tablet Take 1.5 tablets (18.75 mg total) by mouth 2 (two) times daily. Please keep upcoming appt with Dr. Curt Bears in February 2022 before anymore refills. Thank you 90 tablet 0  . cetirizine (ZYRTEC) 10 MG tablet TAKE 1 TABLET(10 MG) BY MOUTH AT BEDTIME AS NEEDED FOR ALLERGIES 30 tablet 1  . famotidine (PEPCID) 20 MG tablet TAKE 1 TABLET(20 MG) BY MOUTH TWICE DAILY 60 tablet 3  . fluticasone (FLONASE) 50 MCG/ACT nasal spray SHAKE LIQUID AND USE 2 SPRAYS IN EACH NOSTRIL DAILY 48 g 3  . furosemide (LASIX) 20 MG tablet Take 20 mg by mouth 2 (two) times daily.     Marland Kitchen gabapentin (NEURONTIN) 300 MG capsule Take 300 mg by mouth 3 (three) times daily.     . Glucose Blood (BLOOD GLUCOSE TEST STRIPS) STRP 2 (two) times a week.    . losartan (COZAAR) 100 MG tablet Take 100 mg by mouth daily.    . methocarbamol (ROBAXIN) 750 MG tablet Take 750 mg by mouth 3 (three) times daily.  5  .  NARCAN 4 MG/0.1ML LIQD nasal spray kit Place 1 spray into the nose as directed.    Marland Kitchen oxybutynin (DITROPAN XL) 10 MG 24 hr tablet Take 1 tablet (10 mg total) by mouth at bedtime. 30 tablet 6  . oxyCODONE-acetaminophen (PERCOCET) 10-325 MG tablet TK 1 T PO Q 6 H PRF PAIN    . primidone (MYSOLINE) 50 MG tablet TAKE 1 TABLET BY MOUTH EVERY MORNING AND 6 TABLETS BY MOUTH EVERY EVENING 210 tablet 10  . sertraline (ZOLOFT) 100 MG tablet Take 1 tablet (100 mg total) by mouth daily. 90 tablet 0  . spironolactone (ALDACTONE) 50 MG tablet Take 50 mg by mouth 2 (two) times daily.    Marland Kitchen zolpidem (AMBIEN CR) 12.5 MG CR tablet Take 1 tablet (12.5 mg total) by mouth at  bedtime as needed for sleep. 30 tablet 2   No current facility-administered medications for this visit.    Allergies:   Ace inhibitors   Social History:  The patient  reports that she has never smoked. She has never used smokeless tobacco. She reports that she does not drink alcohol and does not use drugs.   Family History:  The patient's family history includes Atrial fibrillation in her sister; Cancer in her maternal grandfather; Diabetes in her maternal grandmother and mother; Hyperlipidemia in her mother; Hypertension in her maternal grandmother and mother; Kidney disease in her father and maternal grandmother; Pancreatic cancer in her maternal grandmother; Sarcoidosis in her mother.   ROS:  Please see the history of present illness.   Otherwise, review of systems is positive for none.   All other systems are reviewed and negative.   PHYSICAL EXAM: VS:  LMP 09/17/2017 (Approximate)  , BMI There is no height or weight on file to calculate BMI. GEN: Well nourished, well developed, in no acute distress  HEENT: normal  Neck: no JVD, carotid bruits, or masses Cardiac: ***RRR; no murmurs, rubs, or gallops,no edema  Respiratory:  clear to auscultation bilaterally, normal work of breathing GI: soft, nontender, nondistended, + BS MS: no deformity or atrophy  Skin: warm and dry Neuro:  Strength and sensation are intact Psych: euthymic mood, full affect  EKG:  EKG {ACTION; IS/IS [id number]} ordered today. Personal review of the ekg ordered *** shows ***   Recent Labs: No results found for requested labs within last 8760 hours.    Lipid Panel     Component Value Date/Time   CHOL 183 07/05/2018 1401   TRIG 156 (H) 07/05/2018 1401   HDL 38 (L) 07/05/2018 1401   CHOLHDL 4.8 (H) 07/05/2018 1401   LDLCALC 114 (H) 07/05/2018 1401     Wt Readings from Last 3 Encounters:  11/16/19 250 lb (113.4 kg)  09/27/19 230 lb (104.3 kg)  03/28/19 235 lb (106.6 kg)      Other studies  Reviewed: Additional studies/ records that were reviewed today include: Cardiac monitor 12/14/17 Sinus rhythm and sinus tachycardia All symptoms associated with sinus rhythm and sinus tachycardia No arrhythmias seen Zero atrial fibrillation  TTE 11/26/2019 1. Left ventricular ejection fraction, by visual estimation, is 60 to  65%. The left ventricle has normal function. There is no left ventricular  hypertrophy.  2. Global right ventricle has normal systolic function.The right  ventricular size is normal. No increase in right ventricular wall  thickness.  3. Left atrial size was normal.  4. Right atrial size was normal.  5. The mitral valve is normal in structure. Trace mitral valve  regurgitation. No evidence  of mitral stenosis.  6. The tricuspid valve is normal in structure. Tricuspid valve  regurgitation is not demonstrated.  7. The aortic valve is normal in structure. Aortic valve regurgitation is  not visualized. No evidence of aortic valve sclerosis or stenosis.  8. The pulmonic valve was normal in structure. Pulmonic valve  regurgitation is not visualized.  9. Moderately elevated pulmonary artery systolic pressure.  10. The inferior vena cava is normal in size with greater than 50%  respiratory variability, suggesting right atrial pressure of 3 mmHg.   ASSESSMENT AND PLAN:  1. Palpitations: Currently on carvedilol.***  2. Hypertension:***  Current medicines are reviewed at length with the patient today.   The patient does not have concerns regarding her medicines.  The following changes were made today: ***  Labs/ tests ordered today include:  No orders of the defined types were placed in this encounter.    Disposition:   FU with Will Camnitz *** year  Signed, Will Meredith Leeds, MD  02/16/2021 7:41 AM     [facility] [address] [place] [address] [phone] (office) [phone] (fax).inic

## 2021-02-17 ENCOUNTER — Ambulatory Visit: Payer: Medicaid Other | Admitting: Physician Assistant

## 2021-03-11 ENCOUNTER — Other Ambulatory Visit: Payer: Self-pay | Admitting: Cardiology

## 2021-03-12 NOTE — Telephone Encounter (Signed)
Pt is way overdue for an appt with Dr. Curt Bears and has cancelled multiple times and has rescheduled for April. Would Dr. Curt Bears like to refill this medication until appt again? Please address

## 2021-03-16 ENCOUNTER — Other Ambulatory Visit: Payer: Self-pay | Admitting: *Deleted

## 2021-03-16 MED ORDER — CARVEDILOL 12.5 MG PO TABS: 18.7500 mg | ORAL_TABLET | Freq: Two times a day (BID) | ORAL | 0 refills | Status: DC

## 2021-03-16 NOTE — Progress Notes (Deleted)
Cardiology Office Note Date:  03/16/2021  Patient ID:  Erin Bradford, Erin Bradford 07-13-1971, MRN [id number] PCP:  Azzie Glatter, FNP  Electrophysiologist: Dr. Curt Bears  ***refresh   Chief Complaint: *** 35mo History of Present Illness: Erin BURGGRAFis a 50y.o. female with history of HTN, depression/anxiety, palpitations, chronic hypokalemia.  She comes in today to be seen for Dr. CCurt Bears last seen by him vis t [facility] Nov 2020, at that time, she had ongoing c/o palpitations, SOB, and a new c/o CP, generalized weakness, fatigue, mentioned an insurance exam reported she had a heart murmur. Planned for echo, coronary CT.  Echo noted LVEF 60-65%, PAP 49 CT not done  Needs refill on coreg  *** symptoms *** ? CP *** palps *** apnea??  Never tested     Past Medical History:  Diagnosis Date  . Abnormal laboratory test   . Acid reflux   . Allergy   . Anemia   . Anxiety   . Arthritis   . Blood transfusion without reported diagnosis   . BMI 39.0-39.9,adult   . Chronic hypokalemia   . Chronic kidney disease    mild  . Depression   . GERD (gastroesophageal reflux disease)   . H/O: hysterectomy   . Heart murmur   . Hypertension   . Insomnia   . Low back pain   . Obesity   . Occasional tremors   . Palpitations    dx with tachycardia   . PONV (postoperative nausea and vomiting)   . Tiredness   . Urinary frequency     Past Surgical History:  Procedure Laterality Date  . CESAREAN SECTION    . TOOTH EXTRACTION    . TOTAL LAPAROSCOPIC HYSTERECTOMY WITH SALPINGECTOMY Bilateral 10/06/2017   Procedure: ATTEMPTED HYSTERECTOMY TOTAL LAPAROSCOPIC WITH SALPINGECTOMY CONVERTED TO OPEN ABDOMINAL TOTAL HYSTERECTOMY WITH SALPINGECTOMY;  Surgeon: BSherlyn Hay DO;  Location: [facility]  Service: Gynecology;  Laterality: Bilateral;    Current Outpatient Medications  Medication Sig Dispense Refill  . albuterol (PROVENTIL) (2.5 MG/3ML)  0.083% nebulizer solution Take 3 mLs (2.5 mg total) by nebulization every 6 (six) hours as needed for wheezing or shortness of breath. 75 mL 12  . albuterol (VENTOLIN HFA) 108 (90 Base) MCG/ACT inhaler Inhale 2 puffs into the lungs every 6 (six) hours as needed for wheezing or shortness of breath. 8 g 12  . AMITIZA 24 MCG capsule     . buPROPion (WELLBUTRIN XL) 300 MG 24 hr tablet Take 1 tablet (300 mg total) by mouth daily. 90 tablet 0  . carvedilol (COREG) 12.5 MG tablet Take 1.5 tablets (18.75 mg total) by mouth 2 (two) times daily. Please keep upcoming appt with Dr. CCurt Bearsin February 2022 before anymore refills. Thank you 90 tablet 0  . cetirizine (ZYRTEC) 10 MG tablet TAKE 1 TABLET(10 MG) BY MOUTH AT BEDTIME AS NEEDED FOR ALLERGIES 30 tablet 1  . famotidine (PEPCID) 20 MG tablet TAKE 1 TABLET(20 MG) BY MOUTH TWICE DAILY 60 tablet 3  . fluticasone (FLONASE) 50 MCG/ACT nasal spray SHAKE LIQUID AND USE 2 SPRAYS IN EACH NOSTRIL DAILY 48 g 3  . furosemide (LASIX) 20 MG tablet Take 20 mg by mouth 2 (two) times daily.     .Marland Kitchengabapentin (NEURONTIN) 300 MG capsule Take 300 mg by mouth 3 (three) times daily.     . Glucose Blood (BLOOD GLUCOSE TEST STRIPS) STRP 2 (two) times a week.    .Marland Kitchen  losartan (COZAAR) 100 MG tablet Take 100 mg by mouth daily.    . methocarbamol (ROBAXIN) 750 MG tablet Take 750 mg by mouth 3 (three) times daily.  5  . NARCAN 4 MG/0.1ML LIQD nasal spray kit Place 1 spray into the nose as directed.    Marland Kitchen oxybutynin (DITROPAN XL) 10 MG 24 hr tablet Take 1 tablet (10 mg total) by mouth at bedtime. 30 tablet 6  . oxyCODONE-acetaminophen (PERCOCET) 10-325 MG tablet TK 1 T PO Q 6 H PRF PAIN    . primidone (MYSOLINE) 50 MG tablet TAKE 1 TABLET BY MOUTH EVERY MORNING AND 6 TABLETS BY MOUTH EVERY EVENING 210 tablet 10  . sertraline (ZOLOFT) 100 MG tablet Take 1 tablet (100 mg total) by mouth daily. 90 tablet 0  . spironolactone (ALDACTONE) 50 MG tablet Take 50 mg by mouth 2 (two) times daily.     Marland Kitchen zolpidem (AMBIEN CR) 12.5 MG CR tablet Take 1 tablet (12.5 mg total) by mouth at bedtime as needed for sleep. 30 tablet 2   No current facility-administered medications for this visit.    Allergies:   Ace inhibitors   Social History:  The patient  reports that she has never smoked. She has never used smokeless tobacco. She reports that she does not drink alcohol and does not use drugs.   Family History:  The patient's family history includes Atrial fibrillation in her sister; Cancer in her maternal grandfather; Diabetes in her maternal grandmother and mother; Hyperlipidemia in her mother; Hypertension in her maternal grandmother and mother; Kidney disease in her father and maternal grandmother; Pancreatic cancer in her maternal grandmother; Sarcoidosis in her mother.  ROS:  Please see the history of present illness.    All other systems are reviewed and otherwise negative.   PHYSICAL EXAM:  VS:  LMP 09/17/2017 (Approximate)  BMI: There is no height or weight on file to calculate BMI. Well nourished, well developed, in no acute distress HEENT: normocephalic, atraumatic Neck: no JVD, carotid bruits or masses Cardiac:  *** RRR; no significant murmurs, no rubs, or gallops Lungs:  *** CTA b/l, no wheezing, rhonchi or rales Abd: soft, nontender MS: no deformity or *** atrophy Ext: *** no edema Skin: warm and dry, no rash Neuro:  No gross deficits appreciated Psych: euthymic mood, full affect    EKG:  Done today and reviewed by myself shows  ***  11/26/2019: TTE IMPRESSIONS  1. Left ventricular ejection fraction, by visual estimation, is 60 to  65%. The left ventricle has normal function. There is no left ventricular  hypertrophy.  2. Global right ventricle has normal systolic function.The right  ventricular size is normal. No increase in right ventricular wall  thickness.  3. Left atrial size was normal.  4. Right atrial size was normal.  5. The mitral valve is normal in  structure. Trace mitral valve  regurgitation. No evidence of mitral stenosis.  6. The tricuspid valve is normal in structure. Tricuspid valve  regurgitation is not demonstrated.  7. The aortic valve is normal in structure. Aortic valve regurgitation is  not visualized. No evidence of aortic valve sclerosis or stenosis.  8. The pulmonic valve was normal in structure. Pulmonic valve  regurgitation is not visualized.  9. Moderately elevated pulmonary artery systolic pressure.  10. The inferior vena cava is normal in size with greater than 50%  respiratory variability, suggesting right atrial pressure of 3 mmHg.    monitor 12/14/17 Sinus rhythm and sinus tachycardia All symptoms  associated with sinus rhythm and sinus tachycardia No arrhythmias seen Zero atrial fibrillation    Recent Labs: No results found for requested labs within last 8760 hours.  No results found for requested labs within last 8760 hours.   CrCl cannot be calculated (Patient's most recent lab result is older than the maximum 21 days allowed.).   Wt Readings from Last 3 Encounters:  11/16/19 250 lb (113.4 kg)  09/27/19 230 lb (104.3 kg)  03/28/19 235 lb (106.6 kg)     Other studies reviewed: Additional studies/records reviewed today include: summarized above  ASSESSMENT AND PLAN:  1. Palpitations     Monitor in 2018, n o arrhythmias     ***  2. HTN     ***  3. ***  Disposition: F/u with ***  Current medicines are reviewed at length with the patient today.  The patient did not have any concerns regarding medicines.  Venetia Night, PA-C 03/16/2021 12:32 PM     [address] [place] [address] [phone] (office)  [phone] (fax)

## 2021-03-18 NOTE — Telephone Encounter (Signed)
Pt seeing Tommye Standard tomorrow.  Will forward to covering staff to ensure refill is addressed at appt tomorrow.

## 2021-03-19 ENCOUNTER — Ambulatory Visit: Payer: Medicaid Other | Admitting: Physician Assistant

## 2021-03-25 ENCOUNTER — Other Ambulatory Visit: Payer: Self-pay

## 2021-03-25 ENCOUNTER — Ambulatory Visit (INDEPENDENT_AMBULATORY_CARE_PROVIDER_SITE_OTHER): Payer: Medicaid Other | Admitting: Student

## 2021-03-25 ENCOUNTER — Encounter: Payer: Self-pay | Admitting: Student

## 2021-03-25 VITALS — BP 158/88 | HR 73 | Ht 66.5 in | Wt 276.0 lb

## 2021-03-25 DIAGNOSIS — I151 Hypertension secondary to other renal disorders: Secondary | ICD-10-CM

## 2021-03-25 DIAGNOSIS — N2889 Other specified disorders of kidney and ureter: Secondary | ICD-10-CM

## 2021-03-25 DIAGNOSIS — R002 Palpitations: Secondary | ICD-10-CM | POA: Diagnosis not present

## 2021-03-25 MED ORDER — CARVEDILOL 25 MG PO TABS: 25.0000 mg | ORAL_TABLET | Freq: Two times a day (BID) | ORAL | 3 refills | Status: DC

## 2021-03-25 NOTE — Progress Notes (Signed)
PCP:  Azzie Glatter, FNP Primary Cardiologist: No primary care provider on file. Electrophysiologist: Will Meredith Leeds, MD   Erin Bradford is a 50 y.o. female seen today for Will Meredith Leeds, MD for routine electrophysiology followup.  Since last being seen in our clinic the patient reports doing well overall. She has occasional palpitations. These last a few seconds, usually no more than three times a week. They feel like her heart is racing.  she denies chest pain, dyspnea, PND, orthopnea, nausea, vomiting, dizziness, syncope, edema, weight gain, or early satiety.  Past Medical History:  Diagnosis Date  . Abnormal laboratory test   . Acid reflux   . Allergy   . Anemia   . Anxiety   . Arthritis   . Blood transfusion without reported diagnosis   . BMI 39.0-39.9,adult   . Chronic hypokalemia   . Chronic kidney disease    mild  . Depression   . GERD (gastroesophageal reflux disease)   . H/O: hysterectomy   . Heart murmur   . Hypertension   . Insomnia   . Low back pain   . Obesity   . Occasional tremors   . Palpitations    dx with tachycardia   . PONV (postoperative nausea and vomiting)   . Tiredness   . Urinary frequency    Past Surgical History:  Procedure Laterality Date  . CESAREAN SECTION    . TOOTH EXTRACTION    . TOTAL LAPAROSCOPIC HYSTERECTOMY WITH SALPINGECTOMY Bilateral 10/06/2017   Procedure: ATTEMPTED HYSTERECTOMY TOTAL LAPAROSCOPIC WITH SALPINGECTOMY CONVERTED TO OPEN ABDOMINAL TOTAL HYSTERECTOMY WITH SALPINGECTOMY;  Surgeon: Sherlyn Hay, DO;  Location: [facility];  Service: Gynecology;  Laterality: Bilateral;    Current Outpatient Medications  Medication Sig Dispense Refill  . albuterol (PROVENTIL) (2.5 MG/3ML) 0.083% nebulizer solution Take 3 mLs (2.5 mg total) by nebulization every 6 (six) hours as needed for wheezing or shortness of breath. 75 mL 12  . albuterol (VENTOLIN HFA) 108 (90 Base) MCG/ACT inhaler Inhale  2 puffs into the lungs every 6 (six) hours as needed for wheezing or shortness of breath. 8 g 12  . AMITIZA 24 MCG capsule     . buPROPion (WELLBUTRIN XL) 300 MG 24 hr tablet Take 1 tablet (300 mg total) by mouth daily. 90 tablet 0  . cetirizine (ZYRTEC) 10 MG tablet TAKE 1 TABLET(10 MG) BY MOUTH AT BEDTIME AS NEEDED FOR ALLERGIES 30 tablet 1  . famotidine (PEPCID) 20 MG tablet TAKE 1 TABLET(20 MG) BY MOUTH TWICE DAILY 60 tablet 3  . fluticasone (FLONASE) 50 MCG/ACT nasal spray SHAKE LIQUID AND USE 2 SPRAYS IN EACH NOSTRIL DAILY 48 g 3  . furosemide (LASIX) 20 MG tablet Take 20 mg by mouth 2 (two) times daily.     Marland Kitchen gabapentin (NEURONTIN) 300 MG capsule Take 300 mg by mouth 3 (three) times daily.     . Glucose Blood (BLOOD GLUCOSE TEST STRIPS) STRP 2 (two) times a week.    . losartan (COZAAR) 100 MG tablet Take 100 mg by mouth daily.    . methocarbamol (ROBAXIN) 750 MG tablet Take 750 mg by mouth 3 (three) times daily.  5  . NARCAN 4 MG/0.1ML LIQD nasal spray kit Place 1 spray into the nose as directed.    Marland Kitchen oxyCODONE-acetaminophen (PERCOCET) 10-325 MG tablet TK 1 T PO Q 6 H PRF PAIN    . primidone (MYSOLINE) 50 MG tablet TAKE 1 TABLET BY MOUTH EVERY MORNING AND  6 TABLETS BY MOUTH EVERY EVENING 210 tablet 10  . sertraline (ZOLOFT) 100 MG tablet Take 1 tablet (100 mg total) by mouth daily. 90 tablet 0  . spironolactone (ALDACTONE) 50 MG tablet Take 50 mg by mouth 2 (two) times daily.    Marland Kitchen zolpidem (AMBIEN CR) 12.5 MG CR tablet Take 1 tablet (12.5 mg total) by mouth at bedtime as needed for sleep. 30 tablet 2  . carvedilol (COREG) 25 MG tablet Take 1 tablet (25 mg total) by mouth 2 (two) times daily. 180 tablet 3   No current facility-administered medications for this visit.    Allergies  Allergen Reactions  . Ace Inhibitors Anaphylaxis and Swelling    Swelling of tongue.    Social History   Socioeconomic History  . Marital status: Widowed    Spouse name: Not on file  . Number of  children: Not on file  . Years of education: Not on file  . Highest education level: Not on file  Occupational History  . Not on file  Tobacco Use  . Smoking status: Never Smoker  . Smokeless tobacco: Never Used  Vaping Use  . Vaping Use: Never used  Substance and Sexual Activity  . Alcohol use: No  . Drug use: No  . Sexual activity: Not Currently    Birth control/protection: Condom  Other Topics Concern  . Not on file  Social History Narrative   Leave with daugther   2-story apartment   Rt hand   occasion 1- cup coffee      Social Determinants of Health   Financial Resource Strain: Not on file  Food Insecurity: Not on file  Transportation Needs: Not on file  Physical Activity: Not on file  Stress: Not on file  Social Connections: Not on file  Intimate Partner Violence: Not on file     Review of Systems: General: No chills, fever, night sweats or weight changes  Cardiovascular:  No chest pain, dyspnea on exertion, edema, orthopnea, palpitations, paroxysmal nocturnal dyspnea Dermatological: No rash, lesions or masses Respiratory: No cough, dyspnea Urologic: No hematuria, dysuria Abdominal: No nausea, vomiting, diarrhea, bright red blood per rectum, melena, or hematemesis Neurologic: No visual changes, weakness, changes in mental status All other systems reviewed and are otherwise negative except as noted above.  Physical Exam: Vitals:   03/25/21 1220  BP: (!) 158/88  Pulse: 73  SpO2: 96%  Weight: 276 lb (125.2 kg)  Height: 5' 6.5" (1.689 m)    GEN- The patient is well appearing, alert and oriented x 3 today.   HEENT: normocephalic, atraumatic; sclera clear, conjunctiva pink; hearing intact; oropharynx clear; neck supple, no JVP Lymph- no cervical lymphadenopathy Lungs- Clear to ausculation bilaterally, normal work of breathing.  No wheezes, rales, rhonchi Heart- Regular rate and rhythm, no murmurs, rubs or gallops, PMI not laterally displaced GI- soft,  non-tender, non-distended, bowel sounds present, no hepatosplenomegaly Extremities- no clubbing, cyanosis, or edema; DP/PT/radial pulses 2+ bilaterally MS- no significant deformity or atrophy Skin- warm and dry, no rash or lesion Psych- euthymic mood, full affect Neuro- strength and sensation are intact  EKG is ordered. Personal review of EKG from today shows NSR at 73 bpm, QRS 88 ms, PR interval 150 ms  Additional studies reviewed include: Previous EP office notes  Assessment and Plan:  1. Palpitations Previous monitor negative for arryhtmias. Likely PACs/PVCs. Reassurance given Will increase coreg to 25 mg BID.  2. HTN Elevated, secondary HTN. Potentially will need her renal glands removed per  patient. Echo 10/2019 with normal EF and no structural abnormalities.   Shirley Friar, PA-C  03/25/21 1:44 PM

## 2021-03-25 NOTE — Patient Instructions (Signed)
Medication Instructions:  Your physician has recommended you make the following change in your medication:   INCREASE: Carvedilol to 25mg  twice daily  *If you need a refill on your cardiac medications before your next appointment, please call your pharmacy*   Lab Work: None If you have labs (blood work) drawn today and your tests are completely normal, you will receive your results only by: Marland Kitchen MyChart Message (if you have MyChart) OR . A paper copy in the mail If you have any lab test that is abnormal or we need to change your treatment, we will call you to review the results.   Follow-Up: At [facility], you and your health needs are our priority.  As part of our continuing mission to provide you with exceptional heart care, we have created designated Provider Care Teams.  These Care Teams include your primary Cardiologist (physician) and Advanced Practice Providers (APPs -  Physician Assistants and Nurse Practitioners) who all work together to provide you with the care you need, when you need it. Your next appointment:   1 year(s)  The format for your next appointment:   In Person  Provider:   You may see Allegra Lai, MD or one of the following Advanced Practice Providers on your designated Care Team:    Chanetta Marshall, NP  Tommye Standard, PA-C  Legrand Como "[place]" [place], [place]

## 2021-04-08 DIAGNOSIS — M25562 Pain in left knee: Secondary | ICD-10-CM | POA: Diagnosis not present

## 2021-04-08 DIAGNOSIS — M5136 Other intervertebral disc degeneration, lumbar region: Secondary | ICD-10-CM | POA: Diagnosis not present

## 2021-04-08 DIAGNOSIS — M25561 Pain in right knee: Secondary | ICD-10-CM | POA: Diagnosis not present

## 2021-04-08 DIAGNOSIS — G8929 Other chronic pain: Secondary | ICD-10-CM | POA: Diagnosis not present

## 2021-04-20 ENCOUNTER — Ambulatory Visit: Payer: Medicaid Other | Admitting: Cardiology

## 2021-05-11 ENCOUNTER — Telehealth (INDEPENDENT_AMBULATORY_CARE_PROVIDER_SITE_OTHER): Payer: Medicaid Other | Admitting: Psychiatry

## 2021-05-11 ENCOUNTER — Other Ambulatory Visit: Payer: Self-pay

## 2021-05-11 ENCOUNTER — Encounter (HOSPITAL_COMMUNITY): Payer: Self-pay | Admitting: Psychiatry

## 2021-05-11 DIAGNOSIS — F330 Major depressive disorder, recurrent, mild: Secondary | ICD-10-CM | POA: Diagnosis not present

## 2021-05-11 DIAGNOSIS — F5101 Primary insomnia: Secondary | ICD-10-CM | POA: Diagnosis not present

## 2021-05-11 MED ORDER — BUPROPION HCL ER (XL) 300 MG PO TB24: 300.0000 mg | ORAL_TABLET | Freq: Every day | ORAL | 0 refills | Status: DC

## 2021-05-11 MED ORDER — ZOLPIDEM TARTRATE ER 12.5 MG PO TBCR: 12.5000 mg | EXTENDED_RELEASE_TABLET | Freq: Every evening | ORAL | 2 refills | Status: DC | PRN

## 2021-05-11 MED ORDER — SERTRALINE HCL 100 MG PO TABS: 100.0000 mg | ORAL_TABLET | Freq: Every day | ORAL | 0 refills | Status: DC

## 2021-05-11 NOTE — Progress Notes (Signed)
Virtual Visit via Telephone Note  I connected with Erin Bradford on [birth year] at 10:40 AM EDT by telephone and verified that I am speaking with the correct person using two identifiers.  Location: Patient: Home Provider: Home office   I discussed the limitations, risks, security and privacy concerns of performing an evaluation and management service by telephone and the availability of in person appointments. I also discussed with the patient that there may be a patient responsible charge related to this service. The patient expressed understanding and agreed to proceed.   History of Present Illness: Patient is evaluated by phone session.  Today she is having cold symptoms and she believes she got it from her daughter.  She is taking her medication and she feels they are working as she is sleeping better.  She denies any crying spells, feeling of hopelessness or worthlessness.  Recently she had a visit with her pain management and her gabapentin was increased.  Her blood pressure medicines were also adjusted and she is taking higher dose of spironolactone.  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  She lives with her daughter.  She denies any feeling of hopelessness or worthlessness.  She is struggling with her weight and she is trying to keep her weight under control.  She has no tremors, shakes or any EPS.  We have recommended therapy to address her coping skills but patient at this time not interested.   Past Psychiatric History: H/Odepression. No h/oinpatient,mania, psychosisor suicidal attempt. Tried Prozac which worked buthadweight gain. Tried Vistaril, trazodone, Thorazine,Lunesta3 mg,Doxepin 50 mg and saphris 5 mg with poor outcome.Lamictal caused rashand temazepam caused headache.   Psychiatric Specialty Exam: Physical Exam  Review of Systems   Weight 276 lb (125.2 kg), last menstrual period 09/17/2017.There is no height or weight on file to calculate BMI.  General Appearance: NA  Eye Contact:  NA  Speech:  Slow  Volume:  Decreased  Mood:  Anxious  Affect:  NA  Thought Process:  Descriptions of Associations: Intact  Orientation:  Full (Time, Place, and Person)  Thought Content:  Rumination  Suicidal Thoughts:  No  Homicidal Thoughts:  No  Memory:  Immediate;   Good Recent;   Good Remote;   Fair  Judgement:  Intact  Insight:  Present  Psychomotor Activity:  NA  Concentration:  Concentration: Fair and Attention Span: Fair  Recall:  Good  Fund of Knowledge:  Good  Language:  Good  Akathisia:  No  Handed:  Right  AIMS (if indicated):     Assets:  Communication Skills Desire for Improvement Housing Resilience Social Support Transportation  ADL's:  Intact  Cognition:  WNL  Sleep:   ok     Assessment and Plan: Major depressive disorder, recurrent.  Primary insomnia.  Patient is stable on her current medication.  She admitted some time anxiety and frustration with the school and recommended therapy to help her coping skills but patient feel that she can manage her symptoms without therapy.  She like to keep the current medication.  I will continue Zoloft 100 mg daily, Wellbutrin XL 300 mg daily and Ambien CR 12.5 mg at bedtime.  Patient is taking multiple medication for her pain and blood pressure.  Discussed polypharmacy.  Recommended to call us back if she has any question or any concern.  Follow-up in 3 months.  Follow Up Instructions:    I discussed the assessment and treatment plan with the patient. The patient was provided an opportunity to ask questions and all were answered. The patient agreed with the plan and demonstrated an understanding of the instructions.   The patient was advised to call back or seek an in-person evaluation if the symptoms worsen or if the condition fails to improve as  anticipated.  I provided 17 minutes of non-face-to-face time during this encounter.   [name], MD

## 2021-05-12 ENCOUNTER — Ambulatory Visit: Payer: Medicaid Other | Admitting: Physician Assistant

## 2021-06-11 DIAGNOSIS — M5136 Other intervertebral disc degeneration, lumbar region: Secondary | ICD-10-CM | POA: Diagnosis not present

## 2021-06-11 DIAGNOSIS — M533 Sacrococcygeal disorders, not elsewhere classified: Secondary | ICD-10-CM | POA: Diagnosis not present

## 2021-06-11 DIAGNOSIS — M47816 Spondylosis without myelopathy or radiculopathy, lumbar region: Secondary | ICD-10-CM | POA: Diagnosis not present

## 2021-07-08 ENCOUNTER — Ambulatory Visit: Payer: Medicaid Other | Admitting: Physician Assistant

## 2021-08-11 ENCOUNTER — Telehealth (INDEPENDENT_AMBULATORY_CARE_PROVIDER_SITE_OTHER): Payer: Medicaid Other | Admitting: Psychiatry

## 2021-08-11 ENCOUNTER — Encounter (HOSPITAL_COMMUNITY): Payer: Self-pay | Admitting: Psychiatry

## 2021-08-11 ENCOUNTER — Other Ambulatory Visit: Payer: Self-pay

## 2021-08-11 DIAGNOSIS — F5101 Primary insomnia: Secondary | ICD-10-CM | POA: Diagnosis not present

## 2021-08-11 DIAGNOSIS — F330 Major depressive disorder, recurrent, mild: Secondary | ICD-10-CM | POA: Diagnosis not present

## 2021-08-11 MED ORDER — SERTRALINE HCL 100 MG PO TABS: 100.0000 mg | ORAL_TABLET | Freq: Every day | ORAL | 0 refills | Status: DC

## 2021-08-11 MED ORDER — ZOLPIDEM TARTRATE ER 12.5 MG PO TBCR: 12.5000 mg | EXTENDED_RELEASE_TABLET | Freq: Every evening | ORAL | 2 refills | Status: DC | PRN

## 2021-08-11 MED ORDER — BUPROPION HCL ER (XL) 300 MG PO TB24: 300.0000 mg | ORAL_TABLET | Freq: Every day | ORAL | 0 refills | Status: DC

## 2021-08-11 NOTE — Progress Notes (Signed)
Virtual Visit via Video Note  I connected with Erin Bradford on [id number] at 10:20 AM EDT by a video enabled telemedicine application and verified that I am speaking with the correct person using two identifiers.  Location: Patient: Home Provider: Home Office   I discussed the limitations of evaluation and management by telemedicine and the availability of in person appointments. The patient expressed understanding and agreed to proceed.  History of Present Illness: Patient is evaluated by a video session.  She is doing better in past few months.  Her 51 year old daughter is now senior and patient is hoping that she chooses to college nearby.  Her gabapentin is increased to 1800 mg a day by her pain specialist and she is feeling improvement in her sleep, pain and she started taking her dog for a walk.  Patient started school and she is hoping to finish her associate's in [place].  She is doing criminal justice and like to do bachelor's in that subject.  She admitted sometime difficult but able to manage.  She denies any crying spells, feeling of hopelessness or worthlessness.  She denies any suicidal thoughts or homicidal thoughts.  She is trying to lose weight and she had lost few pounds since the last visit.  Sometimes she has tremors but she takes primidone that helps the tremors.  Patient like to keep the Zoloft, Wellbutrin and Ambien CR as she feels the combination is helping her anxiety, depression and insomnia.      Past Psychiatric History:  H/O depression.  No h/o inpatient, mania, psychosis or suicidal attempt.  Tried Prozac which worked but had weight gain.  Tried Vistaril, trazodone, Thorazine, Lunesta 3 mg, Doxepin 50 mg and saphris 5 mg with poor outcome.  Lamictal caused rash and temazepam caused headache.   Psychiatric Specialty Exam: Physical Exam  Review of Systems  Weight 270 lb (122.5 kg), last menstrual period 09/17/2017.There is no height or weight on file to calculate  BMI.  General Appearance: Casual  Eye Contact:  Good  Speech:  Clear and Coherent  Volume:  Normal  Mood:  Euthymic  Affect:  Appropriate  Thought Process:  Coherent  Orientation:  Full (Time, Place, and Person)  Thought Content:  WDL  Suicidal Thoughts:  No  Homicidal Thoughts:  No  Memory:  Immediate;   Good Recent;   Good Remote;   Fair  Judgement:  Intact  Insight:  Present  Psychomotor Activity:  Normal  Concentration:  Concentration: Fair and Attention Span: Fair  Recall:  Good  Fund of Knowledge:  Good  Language:  Good  Akathisia:  No  Handed:  Right  AIMS (if indicated):     Assets:  Communication Skills Desire for Improvement Housing Resilience  ADL's:  Intact  Cognition:  WNL  Sleep:   ok      Assessment and Plan: Major depressive disorder, recurrent.  Primary insomnia.  I reviewed current medication.  Patient is now taking gabapentin 1800 mg a day which was increased by her pain specialist and she noticed improvement in her anxiety and sleep.  She still like to keep her current psychotropic medication as she feels her depression and anxiety is manageable.  Discussed polypharmacy.  Continue Wellbutrin XL 300 mg daily, Zoloft 100 mg daily and Ambien CR 12.5 mg at bedtime.  Recommended to call us back if she has any question or any concern.  Follow-up in 3 months.  Follow Up Instructions:    I discussed the assessment and treatment  plan with the patient. The patient was provided an opportunity to ask questions and all were answered. The patient agreed with the plan and demonstrated an understanding of the instructions.   The patient was advised to call back or seek an in-person evaluation if the symptoms worsen or if the condition fails to improve as anticipated.  I provided 20 minutes of non-face-to-face time during this encounter.   [name], MD

## 2021-09-24 ENCOUNTER — Ambulatory Visit: Payer: Medicaid Other | Admitting: Neurology

## 2021-09-29 DIAGNOSIS — M7918 Myalgia, other site: Secondary | ICD-10-CM | POA: Diagnosis not present

## 2021-09-29 DIAGNOSIS — M47816 Spondylosis without myelopathy or radiculopathy, lumbar region: Secondary | ICD-10-CM | POA: Diagnosis not present

## 2021-09-29 DIAGNOSIS — M5136 Other intervertebral disc degeneration, lumbar region: Secondary | ICD-10-CM | POA: Diagnosis not present

## 2021-09-29 DIAGNOSIS — M48061 Spinal stenosis, lumbar region without neurogenic claudication: Secondary | ICD-10-CM | POA: Diagnosis not present

## 2021-10-07 ENCOUNTER — Ambulatory Visit: Payer: Self-pay | Admitting: Nurse Practitioner

## 2021-11-03 ENCOUNTER — Encounter: Payer: Self-pay | Admitting: Oncology

## 2021-11-04 ENCOUNTER — Encounter: Payer: Self-pay | Admitting: Oncology

## 2021-11-04 ENCOUNTER — Other Ambulatory Visit: Payer: Self-pay | Admitting: Neurology

## 2021-11-04 NOTE — Progress Notes (Signed)
NEUROLOGY FOLLOW UP OFFICE NOTE  Erin Bradford [id number]  Assessment/Plan:   Essential tremor  Primidone 25m in morning and 3087mat bedtime Follow up one year  Subjective:  [name] a [age]ear old right-handed female with hypertension, iron deficiency anemia, prediabetes, mild chronic kidney disease, and anxiety who follows up for essential tremor.   UPDATE: She currently takes primidone 503mn AM and 300m60m bedtime.     Tremors have been controlled.       HISTORY: Beginning in 2017, she reports tremor in both hands.  She only notices it when she is performing a task, but not at rest.  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  She also reports difficulty pouring liquids in a spoon or using utensils.  It does not affect writing.  She denies any triggers or anything that improves symptoms.  She thinks it has gotten a little worse over the past year.  She denies starting any new medication prior to onset of symptoms.  She denies family history of tremor.  TSH was 0.92.     She endorses short term memory problems.  She frequently is experiencing word-finding difficulties.  This has been ongoing for about a year but has gotten worse over the past 3 months.  Other than the primidone, she has not had any changes or new medications.  She has not had trouble with finances or disorientation on familiar routes.  To evaluate memory deficits, B12 and TSH were checked, which were 663 and 1.05 respectively. It was determined that her memory problems were related to depression.  However, she subsequently felt it has gotten worse.  She reports that her mother has some memory issues but has not been diagnosed with dementia.  She reports that her grandmother developed some dementia at age 36. 65he reports depression, anxiety and difficulty sleeping.   She was previously on topiramate for anxiety and overeating, but her psychiatrist stopped her.   PAST MEDICAL  HISTORY: Past Medical History:  Diagnosis Date   Abnormal laboratory test    Acid reflux    Allergy    Anemia    Anxiety    Arthritis    Blood transfusion without reported diagnosis    BMI 39.0-39.9,adult    Chronic hypokalemia    Chronic kidney disease    mild   Depression    GERD (gastroesophageal reflux disease)    H/O: hysterectomy    Heart murmur    Hypertension    Insomnia    Low back pain    Obesity    Occasional tremors    Palpitations    dx with tachycardia    PONV (postoperative nausea and vomiting)    Tiredness    Urinary frequency     MEDICATIONS: Current Outpatient Medications on File Prior to Visit  Medication Sig Dispense Refill   albuterol (PROVENTIL) (2.5 MG/3ML) 0.083% nebulizer solution Take 3 mLs (2.5 mg total) by nebulization every 6 (six) hours as needed for wheezing or shortness of breath. 75 mL 12   albuterol (VENTOLIN HFA) 108 (90 Base) MCG/ACT inhaler Inhale 2 puffs into the lungs every 6 (six) hours as needed for wheezing or shortness of breath. 8 g 12   AMITIZA 24 MCG capsule      buPROPion (WELLBUTRIN XL) 300 MG 24 hr tablet Take 1 tablet (300 mg total) by mouth daily. 90 tablet 0   carvedilol (COREG) 25 MG tablet Take 1 tablet (25 mg total)  by mouth 2 (two) times daily. 180 tablet 3   cetirizine (ZYRTEC) 10 MG tablet TAKE 1 TABLET(10 MG) BY MOUTH AT BEDTIME AS NEEDED FOR ALLERGIES 30 tablet 1   famotidine (PEPCID) 20 MG tablet TAKE 1 TABLET(20 MG) BY MOUTH TWICE DAILY 60 tablet 3   fluticasone (FLONASE) 50 MCG/ACT nasal spray SHAKE LIQUID AND USE 2 SPRAYS IN EACH NOSTRIL DAILY 48 g 3   furosemide (LASIX) 20 MG tablet Take 20 mg by mouth 2 (two) times daily.      gabapentin (NEURONTIN) 300 MG capsule Take 2 capsules by mouth 3 (three) times daily.     Glucose Blood (BLOOD GLUCOSE TEST STRIPS) STRP 2 (two) times a week.     losartan (COZAAR) 100 MG tablet Take 100 mg by mouth daily.     methocarbamol (ROBAXIN) 750 MG tablet Take 750 mg by mouth  3 (three) times daily.  5   NARCAN 4 MG/0.1ML LIQD nasal spray kit Place 1 spray into the nose as directed.     primidone (MYSOLINE) 50 MG tablet TAKE 1 TABLET BY MOUTH EVERY MORNING AND 6 TABLETS BY MOUTH EVERY EVENING 210 tablet 10   sertraline (ZOLOFT) 100 MG tablet Take 1 tablet (100 mg total) by mouth daily. 90 tablet 0   spironolactone (ALDACTONE) 100 MG tablet Take 100 mg by mouth 2 (two) times daily.     zolpidem (AMBIEN CR) 12.5 MG CR tablet Take 1 tablet (12.5 mg total) by mouth at bedtime as needed for sleep. 30 tablet 2   No current facility-administered medications on file prior to visit.    ALLERGIES: Allergies  Allergen Reactions   Ace Inhibitors Anaphylaxis and Swelling    Swelling of tongue.    FAMILY HISTORY: Family History  Problem Relation Age of Onset   Diabetes Mother    Hypertension Mother    Hyperlipidemia Mother    Sarcoidosis Mother    Kidney disease Father    Atrial fibrillation Sister    Kidney disease Maternal Grandmother    Pancreatic cancer Maternal Grandmother    Diabetes Maternal Grandmother    Hypertension Maternal Grandmother    Cancer Maternal Grandfather    Colon cancer Neg Hx    Colon polyps Neg Hx    Esophageal cancer Neg Hx    Stomach cancer Neg Hx    Rectal cancer Neg Hx       Objective:  Blood pressure (!) 152/84, pulse 69, height _0  (1.676 m), weight 296 lb 3.2 oz (134.4 kg), last menstrual period 09/17/2017, SpO2 95 %. General: No acute distress.  Patient appears well-groomed.   Head:  Normocephalic/atraumatic Eyes:  Fundi examined but not visualized Neck: supple, no paraspinal tenderness, full range of motion Heart:  Regular rate and rhythm Lungs:  Clear to auscultation bilaterally Back: No paraspinal tenderness Neurological Exam: alert and oriented to person, place, and time.  Speech fluent and not dysarthric, language intact.  CN II-XII intact. Bulk and tone normal, muscle strength 5/5 throughout.  Tremor not  appreciated.  Sensation to light touch intact.  Deep tendon reflexes 1+ left patellar, otherwise 2+ throughout.  Finger to nose testing intact.  Antalgic gait.   Erin Clines, DO  CC: Erin Becton, FNP

## 2021-11-05 ENCOUNTER — Encounter: Payer: Self-pay | Admitting: Oncology

## 2021-11-05 ENCOUNTER — Encounter: Payer: Self-pay | Admitting: Neurology

## 2021-11-05 ENCOUNTER — Other Ambulatory Visit: Payer: Self-pay

## 2021-11-05 ENCOUNTER — Ambulatory Visit (INDEPENDENT_AMBULATORY_CARE_PROVIDER_SITE_OTHER): Payer: Medicare Other | Admitting: Neurology

## 2021-11-05 VITALS — BP 152/84 | HR 69 | Ht 66.0 in | Wt 296.2 lb

## 2021-11-05 DIAGNOSIS — G250 Essential tremor: Secondary | ICD-10-CM

## 2021-11-05 MED ORDER — PRIMIDONE 50 MG PO TABS: ORAL_TABLET | ORAL | 3 refills | Status: DC

## 2021-11-05 NOTE — Patient Instructions (Signed)
Primidone refilled

## 2021-11-10 ENCOUNTER — Encounter (HOSPITAL_COMMUNITY): Payer: Self-pay | Admitting: Psychiatry

## 2021-11-10 ENCOUNTER — Other Ambulatory Visit: Payer: Self-pay

## 2021-11-10 ENCOUNTER — Telehealth (HOSPITAL_BASED_OUTPATIENT_CLINIC_OR_DEPARTMENT_OTHER): Payer: Medicare Other | Admitting: Psychiatry

## 2021-11-10 DIAGNOSIS — F5101 Primary insomnia: Secondary | ICD-10-CM

## 2021-11-10 DIAGNOSIS — F330 Major depressive disorder, recurrent, mild: Secondary | ICD-10-CM

## 2021-11-10 MED ORDER — ZOLPIDEM TARTRATE ER 12.5 MG PO TBCR: 12.5000 mg | EXTENDED_RELEASE_TABLET | Freq: Every evening | ORAL | 1 refills | Status: DC | PRN

## 2021-11-10 MED ORDER — BUPROPION HCL ER (XL) 300 MG PO TB24: 300.0000 mg | ORAL_TABLET | Freq: Every day | ORAL | 0 refills | Status: DC

## 2021-11-10 MED ORDER — SERTRALINE HCL 100 MG PO TABS: 100.0000 mg | ORAL_TABLET | Freq: Every day | ORAL | 0 refills | Status: DC

## 2021-11-10 NOTE — Progress Notes (Signed)
Virtual Visit via Video Note  I connected with Erin Bradford on 73/22/02 at  2:00 PM EST by a video enabled telemedicine application and verified that I am speaking with the correct person using two identifiers.  Location: Patient: Home Provider: Home Office   I discussed the limitations of evaluation and management by telemedicine and the availability of in person appointments. The patient expressed understanding and agreed to proceed.  History of Present Illness: Patient is evaluated by video session.  She recently had a COVID and she felt after that more tired, sleepy and groggy with lack of energy.  She also noticed her leg pain got worse and recently had a visit with a pain specialist.  She is taking Percocet along with gabapentin and muscle relaxant.  She described taking multiple medication but afraid to cut down since she cannot function without medication.  Her sleep is better than before because she is taking medication and COVID had made tired.  She denies any paranoia, crying spells or any feeling of hopelessness or worthlessness.  Her daughter is now going to [employer].  She had a plan to cook with the help of her mother on Thanksgiving.  She has no tremors, shakes or any EPS.  She is compliant with Zoloft and Wellbutrin.  She denies any crying spells or any anhedonia.  Her weight is stable.  Patient told recently seen neurologist and their office put down the wrong weight.  She is getting primidone for chronic tremors.  Past Psychiatric History:  H/O depression.  No h/o inpatient, mania, psychosis or suicidal attempt.  Tried Prozac which worked but had weight gain.  Tried Vistaril, trazodone, Thorazine, Lunesta 3 mg, Doxepin 50 mg and saphris 5 mg with poor outcome.  Lamictal caused rash and temazepam caused headache.   Psychiatric Specialty Exam: Physical Exam  Review of Systems  Weight 269 lb (122 kg), last menstrual period 09/17/2017.There is no height or  weight on file to calculate BMI.  General Appearance: Casual  Eye Contact:  Good  Speech:  Normal Rate  Volume:  Normal  Mood:  Euthymic  Affect:  Appropriate  Thought Process:  Goal Directed  Orientation:  Full (Time, Place, and Person)  Thought Content:  WDL  Suicidal Thoughts:  No  Homicidal Thoughts:  No  Memory:  Immediate;   Good Recent;   Good Remote;   Fair  Judgement:  Intact  Insight:  Present  Psychomotor Activity:  Decreased  Concentration:  Concentration: Fair and Attention Span: Fair  Recall:  Good  Fund of Knowledge:  Good  Language:  Good  Akathisia:  No  Handed:  Right  AIMS (if indicated):     Assets:  Communication Skills Desire for Improvement Housing  ADL's:  Intact  Cognition:  WNL  Sleep:         Assessment and Plan: Major depressive disorder, recurrent.  Primary insomnia.  I reviewed current medication.  She is taking Percocet along with gabapentin, methocarbamol.  She admitted sleeping during the day and there are nights when she skipped Ambien.  We talk about not to take Ambien every night since taking the pain medicine and to discuss polypharmacy.  Patient agreed to take the Ambien every other day or as needed.  We will provide Ambien CR 12.5 mg to take as needed with 30 pills with 1 more refill however she understands it will last for 90 days.  Continue Wellbutrin XL 300 mg daily, Zoloft 200 mg daily.  Reminded to call us back if there is any question or any concern.  Follow-up in 3 months.  Follow Up Instructions:    I discussed the assessment and treatment plan with the patient. The patient was provided an opportunity to ask questions and all were answered. The patient agreed with the plan and demonstrated an understanding of the instructions.   The patient was advised to call back or seek an in-person evaluation if the symptoms worsen or if the condition fails to improve as anticipated.  I provided 22 minutes of non-face-to-face time  during this encounter.   [name], MD

## 2021-11-11 ENCOUNTER — Encounter: Payer: Self-pay | Admitting: Oncology

## 2021-12-11 ENCOUNTER — Encounter: Payer: Self-pay | Admitting: Oncology

## 2022-01-07 ENCOUNTER — Other Ambulatory Visit: Payer: Self-pay | Admitting: Cardiology

## 2022-02-09 ENCOUNTER — Encounter (HOSPITAL_COMMUNITY): Payer: Self-pay | Admitting: Psychiatry

## 2022-02-09 ENCOUNTER — Other Ambulatory Visit: Payer: Self-pay

## 2022-02-09 ENCOUNTER — Telehealth (HOSPITAL_BASED_OUTPATIENT_CLINIC_OR_DEPARTMENT_OTHER): Payer: Medicare Other | Admitting: Psychiatry

## 2022-02-09 DIAGNOSIS — F330 Major depressive disorder, recurrent, mild: Secondary | ICD-10-CM | POA: Diagnosis not present

## 2022-02-09 DIAGNOSIS — F5101 Primary insomnia: Secondary | ICD-10-CM | POA: Diagnosis not present

## 2022-02-09 MED ORDER — SERTRALINE HCL 100 MG PO TABS: 100.0000 mg | ORAL_TABLET | Freq: Every day | ORAL | 0 refills | Status: DC

## 2022-02-09 MED ORDER — BUPROPION HCL ER (XL) 300 MG PO TB24: 300.0000 mg | ORAL_TABLET | Freq: Every day | ORAL | 0 refills | Status: DC

## 2022-02-09 MED ORDER — ZOLPIDEM TARTRATE ER 12.5 MG PO TBCR: 12.5000 mg | EXTENDED_RELEASE_TABLET | Freq: Every evening | ORAL | 2 refills | Status: DC | PRN

## 2022-02-09 NOTE — Progress Notes (Signed)
Virtual Visit via Video Note  I connected with Erin Bradford on 95/62/13 at  2:20 PM EST by a video enabled telemedicine application and verified that I am speaking with the correct person using two identifiers.  Location: Patient: Home Provider: Office   I discussed the limitations of evaluation and management by telemedicine and the availability of in person appointments. The patient expressed understanding and agreed to proceed.  History of Present Illness: Patient is evaluated by video session.  She reported January and the summer was a rough month because she was very sad depressed and has no motivation to do things.  However since February she started to feeling better.  She required Ambien every night for sleep and she is requesting Ambien to take every night.  Patient is at [employer] for associate degree and after that she is hoping to start in spring bachelors in criminal justice.  Patient reported she needs to have a good night sleep otherwise she is behind her school assignments.  Her daughter is in middle college.  Patient had good Christmas as she spent time with the mother who live close by.  She denies any feeling of hopelessness or worthlessness.  Since February started she has no more crying spells and she is more motivated to do things.  She is seeing her pain management at [facility] and getting pain meds.  She is on oxycodone, methocarbamol and gabapentin.  She has no tremor or shakes or any EPS.  She recalled in the summer having a flareup of her pain and she was not able to walk with a cane and required walker.  She has appointment few months ago with her nephrologist Dr. Clover Mealy at [facility] and had blood work.  Appetite is okay.  Her weight is unchanged from the past.  Past Psychiatric History:  H/O depression.  No h/o inpatient, mania, psychosis or suicidal attempt.  Tried Prozac which worked but had weight gain.  Tried Vistaril, trazodone,  Thorazine, Lunesta 3 mg, Doxepin 50 mg and saphris 5 mg with poor outcome.  Lamictal caused rash and temazepam caused headache.    Psychiatric Specialty Exam: Physical Exam  Review of Systems  Weight 269 lb (122 kg), last menstrual period 09/17/2017.There is no height or weight on file to calculate BMI.  General Appearance: Casual  Eye Contact:  Good  Speech:  Clear and Coherent  Volume:  Normal  Mood:  Euthymic  Affect:  Congruent  Thought Process:  Goal Directed  Orientation:  Full (Time, Place, and Person)  Thought Content:  Logical  Suicidal Thoughts:  No  Homicidal Thoughts:  No  Memory:  Immediate;   Good Recent;   Good Remote;   Good  Judgement:  Intact  Insight:  Good  Psychomotor Activity:  Normal  Concentration:  Concentration: Good and Attention Span: Good  Recall:  Good  Fund of Knowledge:  Good  Language:  Good  Akathisia:  No  Handed:  Right  AIMS (if indicated):     Assets:  Communication Skills Desire for Improvement Housing Transportation  ADL's:  Intact  Cognition:  WNL  Sleep:   ok with Ambien      Assessment and Plan: Major depressive disorder, recurrent.  Primary insomnia.  Patient has more depression in December and January which I believe could be due to increased cold weather as patient has chronic pain and flareups.  Now the weather is better she is feeling better.  She like to have Ambien  CR every night so she can sleep better.  She is finishing her associates in the spring and hoping to start bachelors in criminal justice from [employer].  Discussed polypharmacy and pain medication she is taking.  Patient taking these pain medication with Ambien at least a few hours apart.  I recommend to have her blood work results faxed to [place] which was done at [facility].  We will continue Wellbutrin XL 300 mg daily and Zoloft 200 mg daily.  She like to have Ambien CR 12.5 every night and we will renew that.  Discussed  medication side effects and benefits.  Recommended to call us back if there is any question of any concern.  Follow-up in 3 months.  Follow Up Instructions:    I discussed the assessment and treatment plan with the patient. The patient was provided an opportunity to ask questions and all were answered. The patient agreed with the plan and demonstrated an understanding of the instructions.   The patient was advised to call back or seek an in-person evaluation if the symptoms worsen or if the condition fails to improve as anticipated.  I provided 23 minutes of non-face-to-face time during this encounter.   [name], MD

## 2022-02-10 ENCOUNTER — Other Ambulatory Visit (HOSPITAL_COMMUNITY): Payer: Self-pay | Admitting: *Deleted

## 2022-02-10 DIAGNOSIS — F330 Major depressive disorder, recurrent, mild: Secondary | ICD-10-CM

## 2022-02-10 DIAGNOSIS — Z79899 Other long term (current) drug therapy: Secondary | ICD-10-CM

## 2022-02-24 ENCOUNTER — Telehealth (HOSPITAL_COMMUNITY): Payer: Self-pay | Admitting: *Deleted

## 2022-02-24 NOTE — Telephone Encounter (Signed)
PA FOR ZOLPIDEM CR 12.5 MG IS APPROVED FROM 12/27/21 THRU 02/24/23. ?

## 2022-02-24 NOTE — Telephone Encounter (Signed)
PA FOR ZOLPIDEM CR 12.5MG  SUBMITTED TO [employer] VIA [employer]. ? ?PA CASE ID # [id number]. ? ?AWAITING APPROVAL. ?

## 2022-04-19 ENCOUNTER — Ambulatory Visit: Payer: Medicare Other | Admitting: Student

## 2022-04-26 NOTE — Progress Notes (Deleted)
PCP:  Azzie Glatter, FNP Primary Cardiologist: None Electrophysiologist: Will Meredith Leeds, MD   Erin Bradford is a 51 y.o. female seen today for Will Meredith Leeds, MD for routine electrophysiology followup.  Since last being seen in our clinic the patient reports doing ***.  she denies chest pain, palpitations, dyspnea, PND, orthopnea, nausea, vomiting, dizziness, syncope, edema, weight gain, or early satiety.  Past Medical History:  Diagnosis Date   Abnormal laboratory test    Acid reflux    Allergy    Anemia    Anxiety    Arthritis    Blood transfusion without reported diagnosis    BMI 39.0-39.9,adult    Chronic hypokalemia    Chronic kidney disease    mild   Depression    GERD (gastroesophageal reflux disease)    H/O: hysterectomy    Heart murmur    Hypertension    Insomnia    Low back pain    Obesity    Occasional tremors    Palpitations    dx with tachycardia    PONV (postoperative nausea and vomiting)    Tiredness    Urinary frequency    Past Surgical History:  Procedure Laterality Date   CESAREAN SECTION     TOOTH EXTRACTION     TOTAL LAPAROSCOPIC HYSTERECTOMY WITH SALPINGECTOMY Bilateral 10/06/2017   Procedure: ATTEMPTED HYSTERECTOMY TOTAL LAPAROSCOPIC WITH SALPINGECTOMY CONVERTED TO OPEN ABDOMINAL TOTAL HYSTERECTOMY WITH SALPINGECTOMY;  Surgeon: Sherlyn Hay, DO;  Location: [place];  Service: Gynecology;  Laterality: Bilateral;    Current Outpatient Medications  Medication Sig Dispense Refill   albuterol (PROVENTIL) (2.5 MG/3ML) 0.083% nebulizer solution Take 3 mLs (2.5 mg total) by nebulization every 6 (six) hours as needed for wheezing or shortness of breath. 75 mL 12   albuterol (VENTOLIN HFA) 108 (90 Base) MCG/ACT inhaler Inhale 2 puffs into the lungs every 6 (six) hours as needed for wheezing or shortness of breath. 8 g 12   AMITIZA 24 MCG capsule      buPROPion (WELLBUTRIN XL) 300 MG 24 hr tablet Take 1 tablet  (300 mg total) by mouth daily. 90 tablet 0   carvedilol (COREG) 25 MG tablet TAKE 1 TABLET(25 MG) BY MOUTH TWICE DAILY 180 tablet 0   cetirizine (ZYRTEC) 10 MG tablet TAKE 1 TABLET(10 MG) BY MOUTH AT BEDTIME AS NEEDED FOR ALLERGIES 30 tablet 1   famotidine (PEPCID) 20 MG tablet TAKE 1 TABLET(20 MG) BY MOUTH TWICE DAILY 60 tablet 3   fluticasone (FLONASE) 50 MCG/ACT nasal spray SHAKE LIQUID AND USE 2 SPRAYS IN EACH NOSTRIL DAILY 48 g 3   furosemide (LASIX) 20 MG tablet Take 20 mg by mouth 2 (two) times daily.      gabapentin (NEURONTIN) 300 MG capsule Take 2 capsules by mouth 3 (three) times daily.     Glucose Blood (BLOOD GLUCOSE TEST STRIPS) STRP 2 (two) times a week. (Patient not taking: Reported on 11/05/2021)     losartan (COZAAR) 100 MG tablet Take 100 mg by mouth daily.     methocarbamol (ROBAXIN) 750 MG tablet Take 750 mg by mouth 3 (three) times daily.  5   NARCAN 4 MG/0.1ML LIQD nasal spray kit Place 1 spray into the nose as directed.     primidone (MYSOLINE) 50 MG tablet TAKE 1 TABLET BY MOUTH EVERY MORNING AND 6 TABLETS BY MOUTH EVERY EVENING 630 tablet 3   sertraline (ZOLOFT) 100 MG tablet Take 1 tablet (100 mg total) by mouth  daily. 90 tablet 0   spironolactone (ALDACTONE) 100 MG tablet Take 100 mg by mouth 2 (two) times daily.     zolpidem (AMBIEN CR) 12.5 MG CR tablet Take 1 tablet (12.5 mg total) by mouth at bedtime as needed for sleep. 30 tablet 2   No current facility-administered medications for this visit.    Allergies  Allergen Reactions   Ace Inhibitors Anaphylaxis and Swelling    Swelling of tongue.    Social History   Socioeconomic History   Marital status: Widowed    Spouse name: Not on file   Number of children: Not on file   Years of education: Not on file   Highest education level: Not on file  Occupational History   Not on file  Tobacco Use   Smoking status: Never   Smokeless tobacco: Never  Vaping Use   Vaping Use: Never used  Substance and Sexual  Activity   Alcohol use: No   Drug use: No   Sexual activity: Not Currently    Birth control/protection: Condom  Other Topics Concern   Not on file  Social History Narrative   Leave with daugther   2-story apartment   Rt hand   occasion 1- cup coffee      Social Determinants of Health   Financial Resource Strain: Not on file  Food Insecurity: Not on file  Transportation Needs: Not on file  Physical Activity: Not on file  Stress: Not on file  Social Connections: Not on file  Intimate Partner Violence: Not on file     Review of Systems: All other systems reviewed and are otherwise negative except as noted above.  Physical Exam: There were no vitals filed for this visit.  GEN- The patient is well appearing, alert and oriented x 3 today.   HEENT: normocephalic, atraumatic; sclera clear, conjunctiva pink; hearing intact; oropharynx clear; neck supple, no JVP Lymph- no cervical lymphadenopathy Lungs- Clear to ausculation bilaterally, normal work of breathing.  No wheezes, rales, rhonchi Heart- Regular rate and rhythm, no murmurs, rubs or gallops, PMI not laterally displaced GI- soft, non-tender, non-distended, bowel sounds present, no hepatosplenomegaly Extremities- no clubbing, cyanosis, or edema; DP/PT/radial pulses 2+ bilaterally MS- no significant deformity or atrophy Skin- warm and dry, no rash or lesion Psych- euthymic mood, full affect Neuro- strength and sensation are intact  EKG is ordered. Personal review of EKG from today shows ***  Additional studies reviewed include: Previous EP office notes. ***  Assessment and Plan:  1. Palpitations Previous monitor negative for arryhtmias. Likely PACs/PVCs. Reassurance given Continue coreg 25 mg BID.   2. Secondary HTN Elevated, secondary HTN. Potentially will need her renal glands removed per patient. Echo 10/2019 with normal EF and no structural abnormalities.   Follow up with {Blank single:19197::"Dr.  Allred","Dr. Arlan Organ. Klein","Dr. Camnitz","Dr. Lambert","EP APP"} in {Blank single:19197::"2 weeks","4 weeks","3 months","6 months","12 months","as usual post gen change"}   Shirley Friar, [place]  04/26/22 12:22 PM

## 2022-05-04 ENCOUNTER — Ambulatory Visit: Payer: Medicare Other | Admitting: Student

## 2022-05-04 DIAGNOSIS — I151 Hypertension secondary to other renal disorders: Secondary | ICD-10-CM

## 2022-05-04 DIAGNOSIS — R002 Palpitations: Secondary | ICD-10-CM

## 2022-05-11 ENCOUNTER — Encounter (HOSPITAL_COMMUNITY): Payer: Self-pay | Admitting: Psychiatry

## 2022-05-11 ENCOUNTER — Telehealth (HOSPITAL_BASED_OUTPATIENT_CLINIC_OR_DEPARTMENT_OTHER): Payer: Medicare Other | Admitting: Psychiatry

## 2022-05-11 DIAGNOSIS — F330 Major depressive disorder, recurrent, mild: Secondary | ICD-10-CM

## 2022-05-11 DIAGNOSIS — F5101 Primary insomnia: Secondary | ICD-10-CM | POA: Diagnosis not present

## 2022-05-11 MED ORDER — ZOLPIDEM TARTRATE ER 12.5 MG PO TBCR: 12.5000 mg | EXTENDED_RELEASE_TABLET | Freq: Every evening | ORAL | 2 refills | Status: DC | PRN

## 2022-05-11 MED ORDER — SERTRALINE HCL 100 MG PO TABS: 100.0000 mg | ORAL_TABLET | Freq: Every day | ORAL | 0 refills | Status: DC

## 2022-05-11 MED ORDER — BUPROPION HCL ER (XL) 300 MG PO TB24: 300.0000 mg | ORAL_TABLET | Freq: Every day | ORAL | 0 refills | Status: DC

## 2022-05-11 NOTE — Progress Notes (Signed)
Virtual Visit via Video Note ? ?I connected with Shontia Gillooly Lafortune on 70/35/00 at  2:00 PM EDT by a video enabled telemedicine application and verified that I am speaking with the correct person using two identifiers. ? ?Location: ?Patient: Home ?Provider: Home Office ?  ?I discussed the limitations of evaluation and management by telemedicine and the availability of in person appointments. The patient expressed understanding and agreed to proceed. ? ?History of Present Illness: ?Patient is evaluated by video session.  She is doing better on her medication.  She feels since more warm days coming she is more active and feel good.  She finished this semester and currently she is on 2-week break and she will resume her school after 2 weeks.  She is happy that she got good grades.  Her plan is to start bachelors in criminal justice once she finished her associate degree from [employer].  She is hoping to finish in few months.  She is happy that her daughter graduated from the school and now she is going to start college at [employer].  She is sleeping good.  She is with pain management and taking the narcotic pain medication as needed and tizanidine.  Her methocarbamol was discontinued.  She denies any irritability, anger, mania, psychosis.  She denies any suicidal thoughts or any crying spells.  Her appetite is okay.  Her weight is stable.  She apologized not having a blood work because she could not keep the appointment with the call on a kidney but she had scheduled appointment and hoping to get blood work soon.  She has no tremor or shakes or any EPS. ? ?Past Psychiatric History:  ?H/O depression.  No h/o inpatient, mania, psychosis or suicidal attempt.  Tried Prozac which worked but had weight gain.  Tried Vistaril, trazodone, Thorazine, Lunesta 3 mg, Doxepin 50 mg and saphris 5 mg with poor outcome.  Lamictal caused rash and temazepam caused headache.  ?  ?Psychiatric Specialty  Exam: ?Physical Exam  ?Review of Systems  ?Weight 271 lb (122.9 kg), last menstrual period 09/17/2017.There is no height or weight on file to calculate BMI.  ?General Appearance: Casual  ?Eye Contact:  Good  ?Speech:  Clear and Coherent and Normal Rate  ?Volume:  Normal  ?Mood:  Euthymic  ?Affect:  Appropriate  ?Thought Process:  Goal Directed  ?Orientation:  Full (Time, Place, and Person)  ?Thought Content:  Logical  ?Suicidal Thoughts:  No  ?Homicidal Thoughts:  No  ?Memory:  Immediate;   Good ?Recent;   Good ?Remote;   Good  ?Judgement:  Good  ?Insight:  Present  ?Psychomotor Activity:  Normal  ?Concentration:  Concentration: Good and Attention Span: Good  ?Recall:  Good  ?Fund of Knowledge:  Good  ?Language:  Good  ?Akathisia:  No  ?Handed:  Right  ?AIMS (if indicated):     ?Assets:  Communication Skills ?Desire for Improvement ?Housing ?Social Support ?Transportation  ?ADL's:  Intact  ?Cognition:  WNL  ?Sleep:   ok with Ambien  ? ? ? ? ?Assessment and Plan: ?Major depressive disorder, recurrent.  Primary insomnia. ? ?Patient is stable on her current medication.  Continue Ambien CR 12.5 mg at bedtime, Zoloft 100 mg daily and Wellbutrin XL 300 mg daily.  Encouraged to keep appointment with [facility] and Do the Blood Work.  Recommended to Call us Back If She Has Any Question or Any Concern.  Follow-Up in 3 Months. ? ?Follow Up Instructions: ? ?  ?  I discussed the assessment and treatment plan with the patient. The patient was provided an opportunity to ask questions and all were answered. The patient agreed with the plan and demonstrated an understanding of the instructions. ?  ?The patient was advised to call back or seek an in-person evaluation if the symptoms worsen or if the condition fails to improve as anticipated. ? ?Collaboration of Care: Other provider involved in patient's care AEB notes are available in epic to review. ? ?Patient/Guardian was advised Release of Information must be obtained prior to any  record release in order to collaborate their care with an outside provider. Patient/Guardian was advised if they have not already done so to contact the registration department to sign all necessary forms in order for [place] to release information regarding their care.  ? ?Consent: Patient/Guardian gives verbal consent for treatment and assignment of benefits for services provided during this visit. Patient/Guardian expressed understanding and agreed to proceed.   ? ?I provided 21 minutes of non-face-to-face time during this encounter. ? ? ?[name], MD  ?

## 2022-05-14 NOTE — Progress Notes (Deleted)
PCP:  Azzie Glatter, FNP Primary Cardiologist: None Electrophysiologist: Will Meredith Leeds, MD   Erin Bradford is a 51 y.o. female seen today for Will Meredith Leeds, MD for routine electrophysiology followup.  Since last being seen in our clinic the patient reports doing ***.  she denies chest pain, palpitations, dyspnea, PND, orthopnea, nausea, vomiting, dizziness, syncope, edema, weight gain, or early satiety.  Past Medical History:  Diagnosis Date   Abnormal laboratory test    Acid reflux    Allergy    Anemia    Anxiety    Arthritis    Blood transfusion without reported diagnosis    BMI 39.0-39.9,adult    Chronic hypokalemia    Chronic kidney disease    mild   Depression    GERD (gastroesophageal reflux disease)    H/O: hysterectomy    Heart murmur    Hypertension    Insomnia    Low back pain    Obesity    Occasional tremors    Palpitations    dx with tachycardia    PONV (postoperative nausea and vomiting)    Tiredness    Urinary frequency    Past Surgical History:  Procedure Laterality Date   CESAREAN SECTION     TOOTH EXTRACTION     TOTAL LAPAROSCOPIC HYSTERECTOMY WITH SALPINGECTOMY Bilateral 10/06/2017   Procedure: ATTEMPTED HYSTERECTOMY TOTAL LAPAROSCOPIC WITH SALPINGECTOMY CONVERTED TO OPEN ABDOMINAL TOTAL HYSTERECTOMY WITH SALPINGECTOMY;  Surgeon: Sherlyn Hay, DO;  Location: [place] [facility];  Service: Gynecology;  Laterality: Bilateral;    Current Outpatient Medications  Medication Sig Dispense Refill   albuterol (PROVENTIL) (2.5 MG/3ML) 0.083% nebulizer solution Take 3 mLs (2.5 mg total) by nebulization every 6 (six) hours as needed for wheezing or shortness of breath. 75 mL 12   albuterol (VENTOLIN HFA) 108 (90 Base) MCG/ACT inhaler Inhale 2 puffs into the lungs every 6 (six) hours as needed for wheezing or shortness of breath. 8 g 12   AMITIZA 24 MCG capsule      buPROPion (WELLBUTRIN XL) 300 MG 24 hr tablet Take 1 tablet  (300 mg total) by mouth daily. 90 tablet 0   carvedilol (COREG) 25 MG tablet TAKE 1 TABLET(25 MG) BY MOUTH TWICE DAILY 180 tablet 0   cetirizine (ZYRTEC) 10 MG tablet TAKE 1 TABLET(10 MG) BY MOUTH AT BEDTIME AS NEEDED FOR ALLERGIES 30 tablet 1   famotidine (PEPCID) 20 MG tablet TAKE 1 TABLET(20 MG) BY MOUTH TWICE DAILY 60 tablet 3   fluticasone (FLONASE) 50 MCG/ACT nasal spray SHAKE LIQUID AND USE 2 SPRAYS IN EACH NOSTRIL DAILY 48 g 3   furosemide (LASIX) 20 MG tablet Take 20 mg by mouth 2 (two) times daily.      gabapentin (NEURONTIN) 300 MG capsule Take 2 capsules by mouth 3 (three) times daily.     Glucose Blood (BLOOD GLUCOSE TEST STRIPS) STRP 2 (two) times a week. (Patient not taking: Reported on 11/05/2021)     losartan (COZAAR) 100 MG tablet Take 100 mg by mouth daily.     NARCAN 4 MG/0.1ML LIQD nasal spray kit Place 1 spray into the nose as directed.     oxyCODONE-acetaminophen (PERCOCET) 10-325 MG tablet Take by mouth.     primidone (MYSOLINE) 50 MG tablet TAKE 1 TABLET BY MOUTH EVERY MORNING AND 6 TABLETS BY MOUTH EVERY EVENING 630 tablet 3   sertraline (ZOLOFT) 100 MG tablet Take 1 tablet (100 mg total) by mouth daily. 90 tablet 0  spironolactone (ALDACTONE) 100 MG tablet Take 100 mg by mouth 2 (two) times daily.     tiZANidine (ZANAFLEX) 4 MG tablet Take 4 mg by mouth 3 (three) times daily.     zolpidem (AMBIEN CR) 12.5 MG CR tablet Take 1 tablet (12.5 mg total) by mouth at bedtime as needed for sleep. 30 tablet 2   No current facility-administered medications for this visit.    Allergies  Allergen Reactions   Ace Inhibitors Anaphylaxis and Swelling    Swelling of tongue.    Social History   Socioeconomic History   Marital status: Widowed    Spouse name: Not on file   Number of children: Not on file   Years of education: Not on file   Highest education level: Not on file  Occupational History   Not on file  Tobacco Use   Smoking status: Never   Smokeless tobacco:  Never  Vaping Use   Vaping Use: Never used  Substance and Sexual Activity   Alcohol use: No   Drug use: No   Sexual activity: Not Currently    Birth control/protection: Condom  Other Topics Concern   Not on file  Social History Narrative   Leave with daugther   2-story apartment   Rt hand   occasion 1- cup coffee      Social Determinants of Health   Financial Resource Strain: Not on file  Food Insecurity: Not on file  Transportation Needs: Not on file  Physical Activity: Not on file  Stress: Not on file  Social Connections: Not on file  Intimate Partner Violence: Not on file     Review of Systems: All other systems reviewed and are otherwise negative except as noted above.  Physical Exam: There were no vitals filed for this visit.  GEN- The patient is well appearing, alert and oriented x 3 today.   HEENT: normocephalic, atraumatic; sclera clear, conjunctiva pink; hearing intact; oropharynx clear; neck supple, no JVP Lymph- no cervical lymphadenopathy Lungs- Clear to ausculation bilaterally, normal work of breathing.  No wheezes, rales, rhonchi Heart- Regular rate and rhythm, no murmurs, rubs or gallops, PMI not laterally displaced GI- soft, non-tender, non-distended, bowel sounds present, no hepatosplenomegaly Extremities- no clubbing, cyanosis, or edema; DP/PT/radial pulses 2+ bilaterally MS- no significant deformity or atrophy Skin- warm and dry, no rash or lesion Psych- euthymic mood, full affect Neuro- strength and sensation are intact  EKG is ordered. Personal review of EKG from today shows ***  Additional studies reviewed include: Previous EP office notes. ***  Assessment and Plan:  1. Palpitations Previous monitor negative for arryhtmias Suspect PACs/PVCs Continue coreg 25 mg BID  2. HTN Stable on current regimen   Follow up with {Blank single:19197::"Dr. Allred","Dr. Arlan Organ. Klein","Dr. Camnitz","Dr. Lambert","EP APP"} in {Blank  single:19197::"2 weeks","4 weeks","3 months","6 months","12 months","as usual post gen change"}   Shirley Friar, PA-C  05/14/22 10:20 AM

## 2022-05-21 ENCOUNTER — Ambulatory Visit: Payer: Medicare Other | Admitting: Student

## 2022-05-21 DIAGNOSIS — I151 Hypertension secondary to other renal disorders: Secondary | ICD-10-CM

## 2022-05-21 DIAGNOSIS — R002 Palpitations: Secondary | ICD-10-CM

## 2022-06-25 ENCOUNTER — Ambulatory Visit: Payer: Medicare Other | Admitting: Physician Assistant

## 2022-07-03 ENCOUNTER — Other Ambulatory Visit: Payer: Self-pay | Admitting: Cardiology

## 2022-07-04 NOTE — Progress Notes (Deleted)
Cardiology Office Note Date:  07/04/2022  Patient ID:  Erin Bradford, DOB [birth year], MRN [id number] PCP:  Azzie Glatter, FNP  Electrophysiologist: Dr. Curt Bears  ***refresh   Chief Complaint: *** annual visit  History of Present Illness: Erin Bradford is a 51 y.o. female with history of insomnia, HTN, obesity, depression  She comes in today to be seen for Dr Curt Bears, last seen by him via a telehealth visit Nov 2020, at that time struggling with chronic fatigue, reports of palpitations SOB, CP, and apparently told of a murmur via a work physical Questioned if the addition of coreg for her palpitations may contribute to her fatigue, though 1st planned for echo as well as coronary CT  Echo looked OK, no CT found  She next/last saw A. Tillery, PA-C 03/25/21 doing well, minimal/brief palpitations only.  Suspect to be PACs/PVCs with no arrhythmias noted on prior monitoring HTN reported to be secondary the pt reporting that she was told she may need her renal glands removed (?).  *** renal?  Secondary HTN?? *** symptoms, palps, otherwise *** labs, nephrology? *** follows with [facility]   Past Medical History:  Diagnosis Date   Abnormal laboratory test    Acid reflux    Allergy    Anemia    Anxiety    Arthritis    Blood transfusion without reported diagnosis    BMI 39.0-39.9,adult    Chronic hypokalemia    Chronic kidney disease    mild   Depression    GERD (gastroesophageal reflux disease)    H/O: hysterectomy    Heart murmur    Hypertension    Insomnia    Low back pain    Obesity    Occasional tremors    Palpitations    dx with tachycardia    PONV (postoperative nausea and vomiting)    Tiredness    Urinary frequency     Past Surgical History:  Procedure Laterality Date   CESAREAN SECTION     TOOTH EXTRACTION     TOTAL LAPAROSCOPIC HYSTERECTOMY WITH SALPINGECTOMY Bilateral 10/06/2017   Procedure: ATTEMPTED HYSTERECTOMY TOTAL LAPAROSCOPIC WITH SALPINGECTOMY  CONVERTED TO OPEN ABDOMINAL TOTAL HYSTERECTOMY WITH SALPINGECTOMY;  Surgeon: Sherlyn Hay, DO;  Location: [facility];  Service: Gynecology;  Laterality: Bilateral;    Current Outpatient Medications  Medication Sig Dispense Refill   albuterol (PROVENTIL) (2.5 MG/3ML) 0.083% nebulizer solution Take 3 mLs (2.5 mg total) by nebulization every 6 (six) hours as needed for wheezing or shortness of breath. 75 mL 12   albuterol (VENTOLIN HFA) 108 (90 Base) MCG/ACT inhaler Inhale 2 puffs into the lungs every 6 (six) hours as needed for wheezing or shortness of breath. 8 g 12   AMITIZA 24 MCG capsule      buPROPion (WELLBUTRIN XL) 300 MG 24 hr tablet Take 1 tablet (300 mg total) by mouth daily. 90 tablet 0   carvedilol (COREG) 25 MG tablet TAKE 1 TABLET(25 MG) BY MOUTH TWICE DAILY 180 tablet 0   cetirizine (ZYRTEC) 10 MG tablet TAKE 1 TABLET(10 MG) BY MOUTH AT BEDTIME AS NEEDED FOR ALLERGIES 30 tablet 1   famotidine (PEPCID) 20 MG tablet TAKE 1 TABLET(20 MG) BY MOUTH TWICE DAILY 60 tablet 3   fluticasone (FLONASE) 50 MCG/ACT nasal spray SHAKE LIQUID AND USE 2 SPRAYS IN EACH NOSTRIL DAILY 48 g 3   furosemide (LASIX) 20 MG tablet Take 20 mg by mouth 2 (two) times daily.      gabapentin (NEURONTIN)  300 MG capsule Take 2 capsules by mouth 3 (three) times daily.     Glucose Blood (BLOOD GLUCOSE TEST STRIPS) STRP 2 (two) times a week. (Patient not taking: Reported on 11/05/2021)     losartan (COZAAR) 100 MG tablet Take 100 mg by mouth daily.     NARCAN 4 MG/0.1ML LIQD nasal spray kit Place 1 spray into the nose as directed.     oxyCODONE-acetaminophen (PERCOCET) 10-325 MG tablet Take by mouth.     primidone (MYSOLINE) 50 MG tablet TAKE 1 TABLET BY MOUTH EVERY MORNING AND 6 TABLETS BY MOUTH EVERY EVENING 630 tablet 3   sertraline (ZOLOFT) 100 MG tablet Take 1 tablet (100 mg total) by mouth daily. 90 tablet 0   spironolactone (ALDACTONE) 100 MG tablet Take 100 mg by mouth 2 (two) times  daily.     tiZANidine (ZANAFLEX) 4 MG tablet Take 4 mg by mouth 3 (three) times daily.     zolpidem (AMBIEN CR) 12.5 MG CR tablet Take 1 tablet (12.5 mg total) by mouth at bedtime as needed for sleep. 30 tablet 2   No current facility-administered medications for this visit.    Allergies:   Ace inhibitors   Social History:  The patient  reports that she has never smoked. She has never used smokeless tobacco. She reports that she does not drink alcohol and does not use drugs.   Family History:  The patient's family history includes Atrial fibrillation in her sister; Cancer in her maternal grandfather; Diabetes in her maternal grandmother and mother; Hyperlipidemia in her mother; Hypertension in her maternal grandmother and mother; Kidney disease in her father and maternal grandmother; Pancreatic cancer in her maternal grandmother; Sarcoidosis in her mother.  ROS:  Please see the history of present illness.    All other systems are reviewed and otherwise negative.   PHYSICAL EXAM:  VS:  LMP 09/17/2017 (Approximate)  BMI: There is no height or weight on file to calculate BMI. Well nourished, well developed, in no acute distress HEENT: normocephalic, atraumatic Neck: no JVD, carotid bruits or masses Cardiac:  *** RRR; no significant murmurs, no rubs, or gallops Lungs:  *** CTA b/l, no wheezing, rhonchi or rales Abd: soft, nontender MS: no deformity or *** atrophy Ext: *** no edema Skin: warm and dry, no rash Neuro:  No gross deficits appreciated Psych: euthymic mood, full affect   EKG:  Done today and reviewed by myself shows  ***   11/26/2019: TTE  1. Left ventricular ejection fraction, by visual estimation, is 60 to  65%. The left ventricle has normal function. There is no left ventricular  hypertrophy.   2. Global right ventricle has normal systolic function.The right  ventricular size is normal. No increase in right ventricular wall  thickness.   3. Left atrial size was  normal.   4. Right atrial size was normal.   5. The mitral valve is normal in structure. Trace mitral valve  regurgitation. No evidence of mitral stenosis.   6. The tricuspid valve is normal in structure. Tricuspid valve  regurgitation is not demonstrated.   7. The aortic valve is normal in structure. Aortic valve regurgitation is  not visualized. No evidence of aortic valve sclerosis or stenosis.   8. The pulmonic valve was normal in structure. Pulmonic valve  regurgitation is not visualized.   9. Moderately elevated pulmonary artery systolic pressure.  10. The inferior vena cava is normal in size with greater than 50%  respiratory variability, suggesting right atrial  pressure of 3 mmHg.   Dec 2017: monitor Sinus rhythm and sinus tachycardia All symptoms associated with sinus rhythm and sinus tachycardia No arrhythmias seen Zero atrial fibrillation  Recent Labs: No results found for requested labs within last 365 days.  No results found for requested labs within last 365 days.   CrCl cannot be calculated (Patient's most recent lab result is older than the maximum 21 days allowed.).   Wt Readings from Last 3 Encounters:  11/05/21 296 lb 3.2 oz (134.4 kg)  03/25/21 276 lb (125.2 kg)  11/16/19 250 lb (113.4 kg)     Other studies reviewed: Additional studies/records reviewed today include: summarized above  ASSESSMENT AND PLAN:  Palpitations HTN *** On carvedilol  Disposition: F/u with ***  Current medicines are reviewed at length with the patient today.  The patient did not have any concerns regarding medicines.  Venetia Night, PA-C 07/04/2022 6:24 AM     [address] [place] [address] [phone] (office)  [phone] (fax)

## 2022-07-05 ENCOUNTER — Ambulatory Visit: Payer: Medicare Other | Admitting: Physician Assistant

## 2022-07-05 ENCOUNTER — Other Ambulatory Visit: Payer: Self-pay | Admitting: Cardiology

## 2022-07-05 ENCOUNTER — Other Ambulatory Visit: Payer: Self-pay

## 2022-07-05 MED ORDER — CARVEDILOL 25 MG PO TABS: ORAL_TABLET | ORAL | 0 refills | Status: DC

## 2022-07-22 NOTE — Progress Notes (Deleted)
 PCP:  Stroud, Natalie M, FNP Primary Cardiologist: None Electrophysiologist: Will Martin Camnitz, MD   Erin Bradford is a 51 y.o. female seen today for Will Martin Camnitz, MD for routine electrophysiology followup.  Since last being seen in our clinic the patient reports doing ***.  she denies chest pain, palpitations, dyspnea, PND, orthopnea, nausea, vomiting, dizziness, syncope, edema, weight gain, or early satiety.  Past Medical History:  Diagnosis Date   Abnormal laboratory test    Acid reflux    Allergy    Anemia    Anxiety    Arthritis    Blood transfusion without reported diagnosis    BMI 39.0-39.9,adult    Chronic hypokalemia    Chronic kidney disease    mild   Depression    GERD (gastroesophageal reflux disease)    H/O: hysterectomy    Heart murmur    Hypertension    Insomnia    Low back pain    Obesity    Occasional tremors    Palpitations    dx with tachycardia    PONV (postoperative nausea and vomiting)    Tiredness    Urinary frequency    Past Surgical History:  Procedure Laterality Date   CESAREAN SECTION     TOOTH EXTRACTION     TOTAL LAPAROSCOPIC HYSTERECTOMY WITH SALPINGECTOMY Bilateral 10/06/2017   Procedure: ATTEMPTED HYSTERECTOMY TOTAL LAPAROSCOPIC WITH SALPINGECTOMY CONVERTED TO OPEN ABDOMINAL TOTAL HYSTERECTOMY WITH SALPINGECTOMY;  Surgeon: Banga, Cecilia Worema, DO;  Location: [facility];  Service: Gynecology;  Laterality: Bilateral;    Current Outpatient Medications  Medication Sig Dispense Refill   albuterol (PROVENTIL) (2.5 MG/3ML) 0.083% nebulizer solution Take 3 mLs (2.5 mg total) by nebulization every 6 (six) hours as needed for wheezing or shortness of breath. 75 mL 12   albuterol (VENTOLIN HFA) 108 (90 Base) MCG/ACT inhaler Inhale 2 puffs into the lungs every 6 (six) hours as needed for wheezing or shortness of breath. 8 g 12   AMITIZA 24 MCG capsule      buPROPion (WELLBUTRIN XL) 300 MG 24 hr tablet Take 1 tablet  (300 mg total) by mouth daily. 90 tablet 0   carvedilol (COREG) 25 MG tablet TAKE 1 TABLET(25 MG) BY MOUTH TWICE DAILY 180 tablet 0   cetirizine (ZYRTEC) 10 MG tablet TAKE 1 TABLET(10 MG) BY MOUTH AT BEDTIME AS NEEDED FOR ALLERGIES 30 tablet 1   famotidine (PEPCID) 20 MG tablet TAKE 1 TABLET(20 MG) BY MOUTH TWICE DAILY 60 tablet 3   fluticasone (FLONASE) 50 MCG/ACT nasal spray SHAKE LIQUID AND USE 2 SPRAYS IN EACH NOSTRIL DAILY 48 g 3   furosemide (LASIX) 20 MG tablet Take 20 mg by mouth 2 (two) times daily.      gabapentin (NEURONTIN) 300 MG capsule Take 2 capsules by mouth 3 (three) times daily.     Glucose Blood (BLOOD GLUCOSE TEST STRIPS) STRP 2 (two) times a week. (Patient not taking: Reported on 11/05/2021)     losartan (COZAAR) 100 MG tablet Take 100 mg by mouth daily.     NARCAN 4 MG/0.1ML LIQD nasal spray kit Place 1 spray into the nose as directed.     oxyCODONE-acetaminophen (PERCOCET) 10-325 MG tablet Take by mouth.     primidone (MYSOLINE) 50 MG tablet TAKE 1 TABLET BY MOUTH EVERY MORNING AND 6 TABLETS BY MOUTH EVERY EVENING 630 tablet 3   sertraline (ZOLOFT) 100 MG tablet Take 1 tablet (100 mg total) by mouth daily. 90 tablet 0     spironolactone (ALDACTONE) 100 MG tablet Take 100 mg by mouth 2 (two) times daily.     tiZANidine (ZANAFLEX) 4 MG tablet Take 4 mg by mouth 3 (three) times daily.     zolpidem (AMBIEN CR) 12.5 MG CR tablet Take 1 tablet (12.5 mg total) by mouth at bedtime as needed for sleep. 30 tablet 2   No current facility-administered medications for this visit.    Allergies  Allergen Reactions   Ace Inhibitors Anaphylaxis and Swelling    Swelling of tongue.    Social History   Socioeconomic History   Marital status: Widowed    Spouse name: Not on file   Number of children: Not on file   Years of education: Not on file   Highest education level: Not on file  Occupational History   Not on file  Tobacco Use   Smoking status: Never   Smokeless tobacco:  Never  Vaping Use   Vaping Use: Never used  Substance and Sexual Activity   Alcohol use: No   Drug use: No   Sexual activity: Not Currently    Birth control/protection: Condom  Other Topics Concern   Not on file  Social History Narrative   Leave with daugther   2-story apartment   Rt hand   occasion 1- cup coffee      Social Determinants of Health   Financial Resource Strain: Not on file  Food Insecurity: Not on file  Transportation Needs: Not on file  Physical Activity: Not on file  Stress: Not on file  Social Connections: Not on file  Intimate Partner Violence: Not on file     Review of Systems: All other systems reviewed and are otherwise negative except as noted above.  Physical Exam: There were no vitals filed for this visit.  GEN- The patient is well appearing, alert and oriented x 3 today.   HEENT: normocephalic, atraumatic; sclera clear, conjunctiva pink; hearing intact; oropharynx clear; neck supple, no JVP Lymph- no cervical lymphadenopathy Lungs- Clear to ausculation bilaterally, normal work of breathing.  No wheezes, rales, rhonchi Heart- Regular rate and rhythm, no murmurs, rubs or gallops, PMI not laterally displaced GI- soft, non-tender, non-distended, bowel sounds present, no hepatosplenomegaly Extremities- no clubbing, cyanosis, or edema; DP/PT/radial pulses 2+ bilaterally MS- no significant deformity or atrophy Skin- warm and dry, no rash or lesion Psych- euthymic mood, full affect Neuro- strength and sensation are intact  EKG is ordered. Personal review of EKG from today shows ***  Additional studies reviewed include: Previous EP office notes. ***  Assessment and Plan:  1. Palpitations Previous monitor negative for arryhtmias. Likely PACs/PVCs. Reassurance given Continue increase coreg to 25 mg BID.   2. HTN Stable on current regimen  Echo 10/2019 with normal EF and no structural abnormalities.   Follow up with {Blank single:19197::"Dr.  Allred","Dr. Arlan Organ. Klein","Dr. Camnitz","Dr. Lambert","EP APP"} in {Blank single:19197::"2 weeks","4 weeks","3 months","6 months","12 months","as usual post gen change"}   Shirley Friar, [place]  07/22/22 2:10 PM

## 2022-07-27 ENCOUNTER — Ambulatory Visit: Payer: Medicare Other | Admitting: Student

## 2022-07-27 DIAGNOSIS — I151 Hypertension secondary to other renal disorders: Secondary | ICD-10-CM

## 2022-07-27 DIAGNOSIS — R002 Palpitations: Secondary | ICD-10-CM

## 2022-08-10 ENCOUNTER — Telehealth (HOSPITAL_BASED_OUTPATIENT_CLINIC_OR_DEPARTMENT_OTHER): Payer: Medicare Other | Admitting: Psychiatry

## 2022-08-10 ENCOUNTER — Encounter (HOSPITAL_COMMUNITY): Payer: Self-pay | Admitting: Psychiatry

## 2022-08-10 DIAGNOSIS — F5101 Primary insomnia: Secondary | ICD-10-CM

## 2022-08-10 DIAGNOSIS — F330 Major depressive disorder, recurrent, mild: Secondary | ICD-10-CM

## 2022-08-10 MED ORDER — BUPROPION HCL ER (XL) 300 MG PO TB24: 300.0000 mg | ORAL_TABLET | Freq: Every day | ORAL | 0 refills | Status: DC

## 2022-08-10 MED ORDER — ZOLPIDEM TARTRATE ER 12.5 MG PO TBCR: 12.5000 mg | EXTENDED_RELEASE_TABLET | Freq: Every evening | ORAL | 2 refills | Status: DC | PRN

## 2022-08-10 MED ORDER — SERTRALINE HCL 100 MG PO TABS: 100.0000 mg | ORAL_TABLET | Freq: Every day | ORAL | 0 refills | Status: DC

## 2022-08-10 NOTE — Progress Notes (Signed)
Virtual Visit via Video Note  I connected with Erin Bradford on [birth year] at  2:00 PM EDT by a video enabled telemedicine application and verified that I am speaking with the correct person using two identifiers.  Location: Patient: In Car Provider: Home Office   I discussed the limitations of evaluation and management by telemedicine and the availability of in person appointments. The patient expressed understanding and agreed to proceed.  History of Present Illness: Patient is evaluated by video session.  She is dropping her daughter to the school and currently in the car.  She reported things are going very well.  She is trying to watch her calorie intake and had lost few pounds since the last visit.  She is hoping to finished her associate in fall and then she like to apply for 4 years college in criminal justice.  She is making good grades.  Her daughter started first year in [employer].  She feels her depression and anxiety is much better.  Her pain is manageable and she denies any worsening of symptoms.  She denies any mania, psychosis, hallucination.  She sleeps good.  She has appointment with her kidney doctor and she was told everything is normal and she is pleased with the results.  Patient has no tremor or shakes or any EPS.  She is taking Wellbutrin, Zoloft and Ambien CR.  Past Psychiatric History:  H/O depression.  No h/o inpatient, mania, psychosis or suicidal attempt.  Tried Prozac which worked but had weight gain.  Tried Vistaril, trazodone, Thorazine, Lunesta 3 mg, Doxepin 50 mg and saphris 5 mg with poor outcome.  Lamictal caused rash and temazepam caused headache.    Psychiatric Specialty Exam: Physical Exam  Review of Systems  Weight 265 lb (120.2 kg), last menstrual period 09/17/2017.There is no height or weight on file to calculate BMI.  General Appearance: Casual  Eye Contact:  Good  Speech:  Clear and Coherent  Volume:  Normal  Mood:  Euthymic  Affect:   Appropriate  Thought Process:  Goal Directed  Orientation:  Full (Time, Place, and Person)  Thought Content:  Logical  Suicidal Thoughts:  No  Homicidal Thoughts:  No  Memory:  Immediate;   Good Recent;   Good Remote;   Good  Judgement:  Good  Insight:  Present  Psychomotor Activity:  Normal  Concentration:  Concentration: Good and Attention Span: Good  Recall:  Good  Fund of Knowledge:  Good  Language:  Good  Akathisia:  No  Handed:  Right  AIMS (if indicated):     Assets:  Communication Skills Desire for Improvement Housing Resilience Social Support Transportation  ADL's:  Intact  Cognition:  WNL  Sleep:   ok      Assessment and Plan: Major depressive disorder, recurrent.  Primary insomnia.  Patient is stable on her current medication.  Patient told she had a visit to her doctor and she was told her kidney functions are normal.  Continue Ambien CR 12.5 mg at bedtime, Zoloft 100 mg daily and Wellbutrin XL 300 mg daily.  She is hoping to finish her associate and then start criminal justice from 4 years college.  Recommended to call us back if she has any question or any concern.  Follow-up in 3 months.  Follow Up Instructions:    I discussed the assessment and treatment plan with the patient. The patient was provided an opportunity to ask questions and all were answered. The patient agreed with the plan  and demonstrated an understanding of the instructions.   The patient was advised to call back or seek an in-person evaluation if the symptoms worsen or if the condition fails to improve as anticipated.  Collaboration of Care: Other provider involved in patient's care AEB notes are available in epic to review.  Patient/Guardian was advised Release of Information must be obtained prior to any record release in order to collaborate their care with an outside provider. Patient/Guardian was advised if they have not already done so to contact the registration department to sign  all necessary forms in order for [place] to release information regarding their care.   Consent: Patient/Guardian gives verbal consent for treatment and assignment of benefits for services provided during this visit. Patient/Guardian expressed understanding and agreed to proceed.    I provided 18 minutes of non-face-to-face time during this encounter.   [name], MD

## 2022-08-13 ENCOUNTER — Ambulatory Visit: Payer: Medicare Other | Admitting: Student

## 2022-08-13 NOTE — Progress Notes (Deleted)
PCP:  Kallie Locks, FNP Primary Cardiologist: None Electrophysiologist: Will Jorja Loa, MD   Erin Bradford is a 51 y.o. female seen today for Will Jorja Loa, MD for routine electrophysiology followup.  Since last being seen in our clinic the patient reports doing ***.  she denies chest pain, palpitations, dyspnea, PND, orthopnea, nausea, vomiting, dizziness, syncope, edema, weight gain, or early satiety.  Past Medical History:  Diagnosis Date   Abnormal laboratory test    Acid reflux    Allergy    Anemia    Anxiety    Arthritis    Blood transfusion without reported diagnosis    BMI 39.0-39.9,adult    Chronic hypokalemia    Chronic kidney disease    mild   Depression    GERD (gastroesophageal reflux disease)    H/O: hysterectomy    Heart murmur    Hypertension    Insomnia    Low back pain    Obesity    Occasional tremors    Palpitations    dx with tachycardia    PONV (postoperative nausea and vomiting)    Tiredness    Urinary frequency    Past Surgical History:  Procedure Laterality Date   CESAREAN SECTION     TOOTH EXTRACTION     TOTAL LAPAROSCOPIC HYSTERECTOMY WITH SALPINGECTOMY Bilateral 10/06/2017   Procedure: ATTEMPTED HYSTERECTOMY TOTAL LAPAROSCOPIC WITH SALPINGECTOMY CONVERTED TO OPEN ABDOMINAL TOTAL HYSTERECTOMY WITH SALPINGECTOMY;  Surgeon: Edwinna Areola, DO;  [facility]:  [facility];  Service: Gynecology;  Laterality: Bilateral;    Current Outpatient Medications  Medication Sig Dispense Refill   albuterol (PROVENTIL) (2.5 MG/3ML) 0.083% nebulizer solution Take 3 mLs (2.5 mg total) by nebulization every 6 (six) hours as needed for wheezing or shortness of breath. 75 mL 12   albuterol (VENTOLIN HFA) 108 (90 Base) MCG/ACT inhaler Inhale 2 puffs into the lungs every 6 (six) hours as needed for wheezing or shortness of breath. 8 g 12   AMITIZA 24 MCG capsule      buPROPion (WELLBUTRIN XL) 300 MG 24 hr tablet Take 1 tablet  (300 mg total) by mouth daily. 90 tablet 0   carvedilol (COREG) 25 MG tablet TAKE 1 TABLET(25 MG) BY MOUTH TWICE DAILY 180 tablet 0   cetirizine (ZYRTEC) 10 MG tablet TAKE 1 TABLET(10 MG) BY MOUTH AT BEDTIME AS NEEDED FOR ALLERGIES 30 tablet 1   famotidine (PEPCID) 20 MG tablet TAKE 1 TABLET(20 MG) BY MOUTH TWICE DAILY 60 tablet 3   fluticasone (FLONASE) 50 MCG/ACT nasal spray SHAKE LIQUID AND USE 2 SPRAYS IN EACH NOSTRIL DAILY 48 g 3   furosemide (LASIX) 20 MG tablet Take 20 mg by mouth 2 (two) times daily.      gabapentin (NEURONTIN) 300 MG capsule Take 2 capsules by mouth 3 (three) times daily.     Glucose Blood (BLOOD GLUCOSE TEST STRIPS) STRP 2 (two) times a week. (Patient not taking: Reported on 11/05/2021)     losartan (COZAAR) 100 MG tablet Take 100 mg by mouth daily.     NARCAN 4 MG/0.1ML LIQD nasal spray kit Place 1 spray into the nose as directed.     primidone (MYSOLINE) 50 MG tablet TAKE 1 TABLET BY MOUTH EVERY MORNING AND 6 TABLETS BY MOUTH EVERY EVENING 630 tablet 3   sertraline (ZOLOFT) 100 MG tablet Take 1 tablet (100 mg total) by mouth daily. 90 tablet 0   spironolactone (ALDACTONE) 100 MG tablet Take 100 mg by mouth 2 (two)  times daily.     tiZANidine (ZANAFLEX) 4 MG tablet Take 4 mg by mouth 3 (three) times daily.     zolpidem (AMBIEN CR) 12.5 MG CR tablet Take 1 tablet (12.5 mg total) by mouth at bedtime as needed for sleep. 30 tablet 2   No current facility-administered medications for this visit.    Allergies  Allergen Reactions   Ace Inhibitors Anaphylaxis and Swelling    Swelling of tongue.    Social History   Socioeconomic History   Marital status: Widowed    Spouse name: Not on file   Number of children: Not on file   Years of education: Not on file   Highest education level: Not on file  Occupational History   Not on file  Tobacco Use   Smoking status: Never   Smokeless tobacco: Never  Vaping Use   Vaping Use: Never used  Substance and Sexual  Activity   Alcohol use: No   Drug use: No   Sexual activity: Not Currently    Birth control/protection: Condom  Other Topics Concern   Not on file  Social History Narrative   Leave with daugther   2-story apartment   Rt hand   occasion 1- cup coffee      Social Determinants of Health   Financial Resource Strain: Not on file  Food Insecurity: Not on file  Transportation Needs: Not on file  Physical Activity: Not on file  Stress: Not on file  Social Connections: Not on file  Intimate Partner Violence: Not on file     Review of Systems: All other systems reviewed and are otherwise negative except as noted above.  Physical Exam: There were no vitals filed for this visit.  GEN- The patient is well appearing, alert and oriented x 3 today.   HEENT: normocephalic, atraumatic; sclera clear, conjunctiva pink; hearing intact; oropharynx clear; neck supple, no JVP Lymph- no cervical lymphadenopathy Lungs- Clear to ausculation bilaterally, normal work of breathing.  No wheezes, rales, rhonchi Heart- Regular rate and rhythm, no murmurs, rubs or gallops, PMI not laterally displaced GI- soft, non-tender, non-distended, bowel sounds present, no hepatosplenomegaly Extremities- no clubbing, cyanosis, or edema; DP/PT/radial pulses 2+ bilaterally MS- no significant deformity or atrophy Skin- warm and dry, no rash or lesion Psych- euthymic mood, full affect Neuro- strength and sensation are intact  EKG is ordered. Personal review of EKG from today shows ***  Additional studies reviewed include: Previous EP office notes. ***  Assessment and Plan:  1. Palpitations Previous monitor negative for arryhtmias. Likely PACs/PVCs. Reassurance given Continue increase coreg to 25 mg BID.   2. HTN Stable on current regimen  Echo 10/2019 with normal EF and no structural abnormalities.   Follow up with {Blank single:19197::"Dr. Allred","Dr. Arlan Organ. Klein","Dr. Camnitz","Dr. Lambert","EP  APP"} in {Blank single:19197::"2 weeks","4 weeks","3 months","6 months","12 months","as usual post gen change"}   Shirley Friar, [place]  08/13/22 10:49 AM

## 2022-09-30 ENCOUNTER — Encounter: Payer: Self-pay | Admitting: Oncology

## 2022-10-07 ENCOUNTER — Ambulatory Visit: Payer: Self-pay | Admitting: Nurse Practitioner

## 2022-10-08 ENCOUNTER — Ambulatory Visit: Payer: Medicare Other | Admitting: Student

## 2022-10-08 NOTE — Progress Notes (Deleted)
PCP:  Azzie Glatter, FNP Primary Cardiologist: None Electrophysiologist: Will Meredith Leeds, MD   Erin Bradford is a 51 y.o. female seen today for Will Meredith Leeds, MD for routine electrophysiology followup. Since last being seen in our clinic the patient reports doing ***.  she denies chest pain, palpitations, dyspnea, PND, orthopnea, nausea, vomiting, dizziness, syncope, edema, weight gain, or early satiety.   Past Medical History:  Diagnosis Date   Abnormal laboratory test    Acid reflux    Allergy    Anemia    Anxiety    Arthritis    Blood transfusion without reported diagnosis    BMI 39.0-39.9,adult    Chronic hypokalemia    Chronic kidney disease    mild   Depression    GERD (gastroesophageal reflux disease)    H/O: hysterectomy    Heart murmur    Hypertension    Insomnia    Low back pain    Obesity    Occasional tremors    Palpitations    dx with tachycardia    PONV (postoperative nausea and vomiting)    Tiredness    Urinary frequency    Past Surgical History:  Procedure Laterality Date   CESAREAN SECTION     TOOTH EXTRACTION     TOTAL LAPAROSCOPIC HYSTERECTOMY WITH SALPINGECTOMY Bilateral 10/06/2017   Procedure: ATTEMPTED HYSTERECTOMY TOTAL LAPAROSCOPIC WITH SALPINGECTOMY CONVERTED TO OPEN ABDOMINAL TOTAL HYSTERECTOMY WITH SALPINGECTOMY;  Surgeon: Sherlyn Hay, DO;  Location: [facility];  Service: Gynecology;  Laterality: Bilateral;    Current Outpatient Medications  Medication Sig Dispense Refill   albuterol (PROVENTIL) (2.5 MG/3ML) 0.083% nebulizer solution Take 3 mLs (2.5 mg total) by nebulization every 6 (six) hours as needed for wheezing or shortness of breath. 75 mL 12   albuterol (VENTOLIN HFA) 108 (90 Base) MCG/ACT inhaler Inhale 2 puffs into the lungs every 6 (six) hours as needed for wheezing or shortness of breath. 8 g 12   AMITIZA 24 MCG capsule      buPROPion (WELLBUTRIN XL) 300 MG 24 hr tablet Take 1  tablet (300 mg total) by mouth daily. 90 tablet 0   carvedilol (COREG) 25 MG tablet TAKE 1 TABLET(25 MG) BY MOUTH TWICE DAILY 180 tablet 0   cetirizine (ZYRTEC) 10 MG tablet TAKE 1 TABLET(10 MG) BY MOUTH AT BEDTIME AS NEEDED FOR ALLERGIES 30 tablet 1   famotidine (PEPCID) 20 MG tablet TAKE 1 TABLET(20 MG) BY MOUTH TWICE DAILY 60 tablet 3   fluticasone (FLONASE) 50 MCG/ACT nasal spray SHAKE LIQUID AND USE 2 SPRAYS IN EACH NOSTRIL DAILY 48 g 3   furosemide (LASIX) 20 MG tablet Take 20 mg by mouth 2 (two) times daily.      gabapentin (NEURONTIN) 300 MG capsule Take 2 capsules by mouth 3 (three) times daily.     Glucose Blood (BLOOD GLUCOSE TEST STRIPS) STRP 2 (two) times a week. (Patient not taking: Reported on 11/05/2021)     losartan (COZAAR) 100 MG tablet Take 100 mg by mouth daily.     NARCAN 4 MG/0.1ML LIQD nasal spray kit Place 1 spray into the nose as directed.     primidone (MYSOLINE) 50 MG tablet TAKE 1 TABLET BY MOUTH EVERY MORNING AND 6 TABLETS BY MOUTH EVERY EVENING 630 tablet 3   sertraline (ZOLOFT) 100 MG tablet Take 1 tablet (100 mg total) by mouth daily. 90 tablet 0   spironolactone (ALDACTONE) 100 MG tablet Take 100 mg by mouth 2 (two)  times daily.     tiZANidine (ZANAFLEX) 4 MG tablet Take 4 mg by mouth 3 (three) times daily.     zolpidem (AMBIEN CR) 12.5 MG CR tablet Take 1 tablet (12.5 mg total) by mouth at bedtime as needed for sleep. 30 tablet 2   No current facility-administered medications for this visit.    Allergies  Allergen Reactions   Ace Inhibitors Anaphylaxis and Swelling    Swelling of tongue.    Social History   Socioeconomic History   Marital status: Widowed    Spouse name: Not on file   Number of children: Not on file   Years of education: Not on file   Highest education level: Not on file  Occupational History   Not on file  Tobacco Use   Smoking status: Never   Smokeless tobacco: Never  Vaping Use   Vaping Use: Never used  Substance and Sexual  Activity   Alcohol use: No   Drug use: No   Sexual activity: Not Currently    Birth control/protection: Condom  Other Topics Concern   Not on file  Social History Narrative   Leave with daugther   2-story apartment   Rt hand   occasion 1- cup coffee      Social Determinants of Health   Financial Resource Strain: Not on file  Food Insecurity: Not on file  Transportation Needs: Not on file  Physical Activity: Not on file  Stress: Not on file  Social Connections: Not on file  Intimate Partner Violence: Not on file     Review of Systems: All other systems reviewed and are otherwise negative except as noted above.  Physical Exam: There were no vitals filed for this visit.  GEN- The patient is well appearing, alert and oriented x 3 today.   HEENT: normocephalic, atraumatic; sclera clear, conjunctiva pink; hearing intact; oropharynx clear; neck supple, no JVP Lymph- no cervical lymphadenopathy Lungs- Clear to ausculation bilaterally, normal work of breathing.  No wheezes, rales, rhonchi Heart- Regular rate and rhythm, no murmurs, rubs or gallops, PMI not laterally displaced GI- soft, non-tender, non-distended, bowel sounds present, no hepatosplenomegaly Extremities- No peripheral edema. no clubbing or cyanosis; DP/PT/radial pulses 2+ bilaterally MS- no significant deformity or atrophy Skin- warm and dry, no rash or lesion Psych- euthymic mood, full affect Neuro- strength and sensation are intact  EKG is ordered. Personal review of EKG from today shows ***  Additional studies reviewed include: Previous EP office notes.   Assessment and Plan:  1. Palpitations Previous monitor negative for arryhtmias. Likely PACs/PVCs. Reassurance given Continue coreg to 25 mg BID.   2. HTN Stable on current regimen   Follow up with {EPMDS:28135} in {EPFOLLOW UP:28173}  Shirley Friar, PA-C  10/08/22 8:22 AM

## 2022-10-28 NOTE — Progress Notes (Signed)
 PCP:  Stroud, Natalie M, FNP Primary Cardiologist: None Electrophysiologist: Will Martin Camnitz, MD   Erin Bradford is a 51 y.o. female seen today for Will Martin Camnitz, MD for routine electrophysiology followup. Last seen 02/2021 by Andy Since last being seen in our clinic the patient reports doing well. She is working to loose some weight, watching what she eats, increasing fruits/veg/fiber. She walks her dog, has somewhat limited mobility. She continues to have palpitations lasting ~20sec about 1/week. These are acceptable to her. She has noticed the palpitations increase when she is not sleeping well or having increased stress. 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   she denies chest pain, dyspnea, PND, orthopnea, nausea, vomiting, dizziness, syncope, edema, weight gain, or early satiety. Edema is improved from years past.   Past Medical History:  Diagnosis Date   Abnormal laboratory test    Acid reflux    Allergy    Anemia    Anxiety    Arthritis    Blood transfusion without reported diagnosis    BMI 39.0-39.9,adult    Chronic hypokalemia    Chronic kidney disease    mild   Depression    GERD (gastroesophageal reflux disease)    H/O: hysterectomy    Heart murmur    Hypertension    Insomnia    Low back pain    Obesity    Occasional tremors    Palpitations    dx with tachycardia    PONV (postoperative nausea and vomiting)    Tiredness    Urinary frequency    Past Surgical History:  Procedure Laterality Date   CESAREAN SECTION     TOOTH EXTRACTION     TOTAL LAPAROSCOPIC HYSTERECTOMY WITH SALPINGECTOMY Bilateral 10/06/2017   Procedure: ATTEMPTED HYSTERECTOMY TOTAL LAPAROSCOPIC WITH SALPINGECTOMY CONVERTED TO OPEN ABDOMINAL TOTAL HYSTERECTOMY WITH SALPINGECTOMY;  Surgeon: Banga, Cecilia Worema, DO;  Location: [facility];  Service: Gynecology;  Laterality: Bilateral;    Current Outpatient Medications   Medication Sig Dispense Refill   albuterol (PROVENTIL) (2.5 MG/3ML) 0.083% nebulizer solution Take 3 mLs (2.5 mg total) by nebulization every 6 (six) hours as needed for wheezing or shortness of breath. 75 mL 12   albuterol (VENTOLIN HFA) 108 (90 Base) MCG/ACT inhaler Inhale 2 puffs into the lungs every 6 (six) hours as needed for wheezing or shortness of breath. 8 g 12   AMITIZA 24 MCG capsule      buPROPion (WELLBUTRIN XL) 300 MG 24 hr tablet Take 1 tablet (300 mg total) by mouth daily. 90 tablet 0   carvedilol (COREG) 25 MG tablet TAKE 1 TABLET(25 MG) BY MOUTH TWICE DAILY 180 tablet 0   cetirizine (ZYRTEC) 10 MG tablet TAKE 1 TABLET(10 MG) BY MOUTH AT BEDTIME AS NEEDED FOR ALLERGIES 30 tablet 1   famotidine (PEPCID) 20 MG tablet TAKE 1 TABLET(20 MG) BY MOUTH TWICE DAILY 60 tablet 3   fluticasone (FLONASE) 50 MCG/ACT nasal spray SHAKE LIQUID AND USE 2 SPRAYS IN EACH NOSTRIL DAILY 48 g 3   furosemide (LASIX) 20 MG tablet Take 20 mg by mouth 2 (two) times daily.      gabapentin (NEURONTIN) 300 MG capsule Take 2 capsules by mouth 3 (three) times daily.     Glucose Blood (BLOOD GLUCOSE TEST STRIPS) STRP      losartan (COZAAR) 100 MG tablet Take 100 mg by mouth daily.     NARCAN 4 MG/0.1ML LIQD nasal spray kit Place 1 spray into   the nose as directed.     primidone (MYSOLINE) 50 MG tablet TAKE 1 TABLET BY MOUTH EVERY MORNING AND 6 TABLETS BY MOUTH EVERY EVENING 630 tablet 3   sertraline (ZOLOFT) 100 MG tablet Take 1 tablet (100 mg total) by mouth daily. 90 tablet 0   spironolactone (ALDACTONE) 100 MG tablet Take 100 mg by mouth 2 (two) times daily.     tiZANidine (ZANAFLEX) 4 MG tablet Take 4 mg by mouth 3 (three) times daily.     zolpidem (AMBIEN CR) 12.5 MG CR tablet Take 1 tablet (12.5 mg total) by mouth at bedtime as needed for sleep. 30 tablet 2   No current facility-administered medications for this visit.    Allergies  Allergen Reactions   Ace Inhibitors Anaphylaxis and Swelling     Swelling of tongue.    Social History   Socioeconomic History   Marital status: Widowed    Spouse name: Not on file   Number of children: Not on file   Years of education: Not on file   Highest education level: Not on file  Occupational History   Not on file  Tobacco Use   Smoking status: Never   Smokeless tobacco: Never  Vaping Use   Vaping Use: Never used  Substance and Sexual Activity   Alcohol use: No   Drug use: No   Sexual activity: Not Currently    Birth control/protection: Condom  Other Topics Concern   Not on file  Social History Narrative   Leave with daugther   2-story apartment   Rt hand   occasion 1- cup coffee      Social Determinants of Health   Financial Resource Strain: Not on file  Food Insecurity: Not on file  Transportation Needs: Not on file  Physical Activity: Not on file  Stress: Not on file  Social Connections: Not on file  Intimate Partner Violence: Not on file     Review of Systems: All other systems reviewed and are otherwise negative except as noted above.  Physical Exam: Vitals:   11/02/22 1035  Weight: 251 lb 9.6 oz (114.1 kg)  Height: 5' 6" (1.676 m)    GEN- The patient is well appearing, alert and oriented x 3 today.   HEENT: normocephalic, atraumatic; sclera clear, conjunctiva pink; hearing intact; oropharynx clear; neck supple, no JVP Lymph- no cervical lymphadenopathy Lungs- Clear to ausculation bilaterally, normal work of breathing.  No wheezes, rales, rhonchi Heart- Regular rate and rhythm, no murmurs, rubs or gallops, PMI not laterally displaced GI- soft, non-tender, non-distended, bowel sounds present, no hepatosplenomegaly Extremities- No peripheral edema. no clubbing or cyanosis; DP/PT/radial pulses 2+ bilaterally MS- no significant deformity or atrophy Skin- warm and dry, no rash or lesion Psych- euthymic mood, full affect Neuro- strength and sensation are intact  EKG is ordered. Personal review of EKG from  today shows NSR, rate 71  Additional studies reviewed include: Previous EP office notes.   Assessment and Plan:  1. Palpitations Previous monitor negative for arryhtmias. Likely PACs/PVCs. Reassurance given Continue coreg to 25 mg BID. Discuss stress relieving activities/videos   2. HTN Stable on current regimen   Follow up with Dr. Curt Bears in 12 months  Mamie Levers, NP  11/02/22 10:41 AM

## 2022-10-29 ENCOUNTER — Ambulatory Visit: Payer: Self-pay | Admitting: Nurse Practitioner

## 2022-11-02 ENCOUNTER — Ambulatory Visit: Payer: Medicare Other | Attending: Student | Admitting: Cardiology

## 2022-11-02 ENCOUNTER — Encounter: Payer: Self-pay | Admitting: Student

## 2022-11-02 VITALS — BP 120/78 | HR 71 | Ht 66.0 in | Wt 251.6 lb

## 2022-11-02 DIAGNOSIS — I4891 Unspecified atrial fibrillation: Secondary | ICD-10-CM | POA: Diagnosis not present

## 2022-11-02 DIAGNOSIS — R002 Palpitations: Secondary | ICD-10-CM | POA: Diagnosis not present

## 2022-11-02 MED ORDER — CARVEDILOL 25 MG PO TABS: 25.0000 mg | ORAL_TABLET | Freq: Two times a day (BID) | ORAL | 3 refills | Status: DC

## 2022-11-02 MED ORDER — LOSARTAN POTASSIUM 100 MG PO TABS: 100.0000 mg | ORAL_TABLET | Freq: Every day | ORAL | 3 refills | Status: DC

## 2022-11-02 NOTE — Patient Instructions (Signed)
Medication Instructions:  Your physician recommends that you continue on your current medications as directed. Please refer to the Current Medication list given to you today.  *If you need a refill on your cardiac medications before your next appointment, please call your pharmacy*   Lab Work: None If you have labs (blood work) drawn today and your tests are completely normal, you will receive your results only by: [facility] (if you have MyChart) OR A paper copy in the mail If you have any lab test that is abnormal or we need to change your treatment, we will call you to review the results.   Follow-Up: At [facility], you and your health needs are our priority.  As part of our continuing mission to provide you with exceptional heart care, we have created designated Provider Care Teams.  These Care Teams include your primary Cardiologist (physician) and Advanced Practice Providers (APPs -  Physician Assistants and Nurse Practitioners) who all work together to provide you with the care you need, when you need it.  Your next appointment:   1 year(s)  The format for your next appointment:   In Person  Provider:   Dr Curt Bears  Important Information About Sugar

## 2022-11-04 NOTE — Progress Notes (Deleted)
NEUROLOGY FOLLOW UP OFFICE NOTE  Erin Bradford [id number]  Assessment/Plan:   Essential tremor  Primidone 57m in morning and 3033mat bedtime Follow up one year  Subjective:  Erin Mcgaughys a [age]ear old right-handed female with hypertension, iron deficiency anemia, prediabetes, mild chronic kidney disease, and anxiety who follows up for essential tremor.   UPDATE: She currently takes primidone 5015mn AM and 300m34m bedtime.     Tremors have been controlled.       HISTORY: Beginning in 2017, she reports tremor in both hands.  She only notices it when she is performing a task, but not at rest.  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  She also reports difficulty pouring liquids in a spoon or using utensils.  It does not affect writing.  She denies any triggers or anything that improves symptoms.  She thinks it has gotten a little worse over the past year.  She denies starting any new medication prior to onset of symptoms.  She denies family history of tremor.  TSH was 0.92.     She endorses short term memory problems.  She frequently is experiencing word-finding difficulties.  This has been ongoing for about a year but has gotten worse over the past 3 months.  Other than the primidone, she has not had any changes or new medications.  She has not had trouble with finances or disorientation on familiar routes.  To evaluate memory deficits, B12 and TSH were checked, which were 663 and 1.05 respectively. It was determined that her memory problems were related to depression.  However, she subsequently felt it has gotten worse.  She reports that her mother has some memory issues but has not been diagnosed with dementia.  She reports that her grandmother developed some dementia at age 97. 60he reports depression, anxiety and difficulty sleeping.   She was previously on topiramate for anxiety and overeating, but her psychiatrist stopped her.   PAST MEDICAL  HISTORY: Past Medical History:  Diagnosis Date   Abnormal laboratory test    Acid reflux    Allergy    Anemia    Anxiety    Arthritis    Blood transfusion without reported diagnosis    BMI 39.0-39.9,adult    Chronic hypokalemia    Chronic kidney disease    mild   Depression    GERD (gastroesophageal reflux disease)    H/O: hysterectomy    Heart murmur    Hypertension    Insomnia    Low back pain    Obesity    Occasional tremors    Palpitations    dx with tachycardia    PONV (postoperative nausea and vomiting)    Tiredness    Urinary frequency     MEDICATIONS: Current Outpatient Medications on File Prior to Visit  Medication Sig Dispense Refill   albuterol (PROVENTIL) (2.5 MG/3ML) 0.083% nebulizer solution Take 3 mLs (2.5 mg total) by nebulization every 6 (six) hours as needed for wheezing or shortness of breath. 75 mL 12   albuterol (VENTOLIN HFA) 108 (90 Base) MCG/ACT inhaler Inhale 2 puffs into the lungs every 6 (six) hours as needed for wheezing or shortness of breath. 8 g 12   AMITIZA 24 MCG capsule      buPROPion (WELLBUTRIN XL) 300 MG 24 hr tablet Take 1 tablet (300 mg total) by mouth daily. 90 tablet 0   carvedilol (COREG) 25 MG tablet Take 1 tablet (25 mg total)  by mouth 2 (two) times daily. 180 tablet 3   cetirizine (ZYRTEC) 10 MG tablet TAKE 1 TABLET(10 MG) BY MOUTH AT BEDTIME AS NEEDED FOR ALLERGIES 30 tablet 1   famotidine (PEPCID) 20 MG tablet TAKE 1 TABLET(20 MG) BY MOUTH TWICE DAILY 60 tablet 3   fluticasone (FLONASE) 50 MCG/ACT nasal spray SHAKE LIQUID AND USE 2 SPRAYS IN EACH NOSTRIL DAILY 48 g 3   furosemide (LASIX) 20 MG tablet Take 20 mg by mouth 2 (two) times daily.      gabapentin (NEURONTIN) 300 MG capsule Take 2 capsules by mouth 3 (three) times daily.     Glucose Blood (BLOOD GLUCOSE TEST STRIPS) STRP      losartan (COZAAR) 100 MG tablet Take 1 tablet (100 mg total) by mouth daily. 90 tablet 3   NARCAN 4 MG/0.1ML LIQD nasal spray kit Place 1 spray  into the nose as directed.     primidone (MYSOLINE) 50 MG tablet TAKE 1 TABLET BY MOUTH EVERY MORNING AND 6 TABLETS BY MOUTH EVERY EVENING 630 tablet 3   sertraline (ZOLOFT) 100 MG tablet Take 1 tablet (100 mg total) by mouth daily. 90 tablet 0   spironolactone (ALDACTONE) 100 MG tablet Take 100 mg by mouth 2 (two) times daily.     tiZANidine (ZANAFLEX) 4 MG tablet Take 4 mg by mouth 3 (three) times daily.     zolpidem (AMBIEN CR) 12.5 MG CR tablet Take 1 tablet (12.5 mg total) by mouth at bedtime as needed for sleep. 30 tablet 2   No current facility-administered medications on file prior to visit.    ALLERGIES: Allergies  Allergen Reactions   Ace Inhibitors Anaphylaxis and Swelling    Swelling of tongue.    FAMILY HISTORY: Family History  Problem Relation Age of Onset   Diabetes Mother    Hypertension Mother    Hyperlipidemia Mother    Sarcoidosis Mother    Kidney disease Father    Atrial fibrillation Sister    Kidney disease Maternal Grandmother    Pancreatic cancer Maternal Grandmother    Diabetes Maternal Grandmother    Hypertension Maternal Grandmother    Cancer Maternal Grandfather    Colon cancer Neg Hx    Colon polyps Neg Hx    Esophageal cancer Neg Hx    Stomach cancer Neg Hx    Rectal cancer Neg Hx       Objective:  *** General: No acute distress.  Patient appears well-groomed.   Head:  Normocephalic/atraumatic Eyes:  Fundi examined but not visualized Neck: supple, no paraspinal tenderness, full range of motion Heart:  Regular rate and rhythm Back: No paraspinal tenderness Neurological Exam: alert and oriented to person, place, and time.  Speech fluent and not dysarthric, language intact.  CN II-XII intact. Bulk and tone normal, muscle strength 5/5 throughout.  Sensation to light touch intact.  Deep tendon reflexes 1+ left patellar.  Otherwise, 2+ throughout.  Finger to nose testing intact.  ***   Erin Clines, DO  CC: [name], FNP

## 2022-11-05 ENCOUNTER — Ambulatory Visit: Payer: Medicare Other | Admitting: Neurology

## 2022-11-05 NOTE — Progress Notes (Deleted)
NEUROLOGY FOLLOW UP OFFICE NOTE  Erin Bradford [id number]  Assessment/Plan:   Essential tremor  Primidone 41m in morning and 3091mat bedtime Follow up one year  Subjective:  Erin Bradford a [age]ear old right-handed female with hypertension, iron deficiency anemia, prediabetes, mild chronic kidney disease, and anxiety who follows up for essential tremor.   UPDATE: She currently takes primidone 5098mn AM and 300m58m bedtime.     Tremors have been controlled.       HISTORY: Beginning in 2017, she reports tremor in both hands.  She only notices it when she is performing a task, but not at rest.  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  She also reports difficulty pouring liquids in a spoon or using utensils.  It does not affect writing.  She denies any triggers or anything that improves symptoms.  She thinks it has gotten a little worse over the past year.  She denies starting any new medication prior to onset of symptoms.  She denies family history of tremor.  TSH was 0.92.     She endorses short term memory problems.  She frequently is experiencing word-finding difficulties.  This has been ongoing for about a year but has gotten worse over the past 3 months.  Other than the primidone, she has not had any changes or new medications.  She has not had trouble with finances or disorientation on familiar routes.  To evaluate memory deficits, B12 and TSH were checked, which were 663 and 1.05 respectively. It was determined that her memory problems were related to depression.  However, she subsequently felt it has gotten worse.  She reports that her mother has some memory issues but has not been diagnosed with dementia.  She reports that her grandmother developed some dementia at age 65. 60he reports depression, anxiety and difficulty sleeping.   She was previously on topiramate for anxiety and overeating, but her psychiatrist stopped her.   PAST MEDICAL  HISTORY: Past Medical History:  Diagnosis Date   Abnormal laboratory test    Acid reflux    Allergy    Anemia    Anxiety    Arthritis    Blood transfusion without reported diagnosis    BMI 39.0-39.9,adult    Chronic hypokalemia    Chronic kidney disease    mild   Depression    GERD (gastroesophageal reflux disease)    H/O: hysterectomy    Heart murmur    Hypertension    Insomnia    Low back pain    Obesity    Occasional tremors    Palpitations    dx with tachycardia    PONV (postoperative nausea and vomiting)    Tiredness    Urinary frequency     MEDICATIONS: Current Outpatient Medications on File Prior to Visit  Medication Sig Dispense Refill   albuterol (PROVENTIL) (2.5 MG/3ML) 0.083% nebulizer solution Take 3 mLs (2.5 mg total) by nebulization every 6 (six) hours as needed for wheezing or shortness of breath. 75 mL 12   albuterol (VENTOLIN HFA) 108 (90 Base) MCG/ACT inhaler Inhale 2 puffs into the lungs every 6 (six) hours as needed for wheezing or shortness of breath. 8 g 12   AMITIZA 24 MCG capsule      buPROPion (WELLBUTRIN XL) 300 MG 24 hr tablet Take 1 tablet (300 mg total) by mouth daily. 90 tablet 0   carvedilol (COREG) 25 MG tablet Take 1 tablet (25 mg total)  by mouth 2 (two) times daily. 180 tablet 3   cetirizine (ZYRTEC) 10 MG tablet TAKE 1 TABLET(10 MG) BY MOUTH AT BEDTIME AS NEEDED FOR ALLERGIES 30 tablet 1   famotidine (PEPCID) 20 MG tablet TAKE 1 TABLET(20 MG) BY MOUTH TWICE DAILY 60 tablet 3   fluticasone (FLONASE) 50 MCG/ACT nasal spray SHAKE LIQUID AND USE 2 SPRAYS IN EACH NOSTRIL DAILY 48 g 3   furosemide (LASIX) 20 MG tablet Take 20 mg by mouth 2 (two) times daily.      gabapentin (NEURONTIN) 300 MG capsule Take 2 capsules by mouth 3 (three) times daily.     Glucose Blood (BLOOD GLUCOSE TEST STRIPS) STRP      losartan (COZAAR) 100 MG tablet Take 1 tablet (100 mg total) by mouth daily. 90 tablet 3   NARCAN 4 MG/0.1ML LIQD nasal spray kit Place 1 spray  into the nose as directed.     primidone (MYSOLINE) 50 MG tablet TAKE 1 TABLET BY MOUTH EVERY MORNING AND 6 TABLETS BY MOUTH EVERY EVENING 630 tablet 3   sertraline (ZOLOFT) 100 MG tablet Take 1 tablet (100 mg total) by mouth daily. 90 tablet 0   spironolactone (ALDACTONE) 100 MG tablet Take 100 mg by mouth 2 (two) times daily.     tiZANidine (ZANAFLEX) 4 MG tablet Take 4 mg by mouth 3 (three) times daily.     zolpidem (AMBIEN CR) 12.5 MG CR tablet Take 1 tablet (12.5 mg total) by mouth at bedtime as needed for sleep. 30 tablet 2   No current facility-administered medications on file prior to visit.    ALLERGIES: Allergies  Allergen Reactions   Ace Inhibitors Anaphylaxis and Swelling    Swelling of tongue.    FAMILY HISTORY: Family History  Problem Relation Age of Onset   Diabetes Mother    Hypertension Mother    Hyperlipidemia Mother    Sarcoidosis Mother    Kidney disease Father    Atrial fibrillation Sister    Kidney disease Maternal Grandmother    Pancreatic cancer Maternal Grandmother    Diabetes Maternal Grandmother    Hypertension Maternal Grandmother    Cancer Maternal Grandfather    Colon cancer Neg Hx    Colon polyps Neg Hx    Esophageal cancer Neg Hx    Stomach cancer Neg Hx    Rectal cancer Neg Hx       Objective:  *** General: No acute distress.  Patient appears well-groomed.   Head:  Normocephalic/atraumatic Eyes:  Fundi examined but not visualized Neck: supple, no paraspinal tenderness, full range of motion Heart:  Regular rate and rhythm Back: No paraspinal tenderness Neurological Exam: alert and oriented to person, place, and time.  Speech fluent and not dysarthric, language intact.  CN II-XII intact. Bulk and tone normal, muscle strength 5/5 throughout.  Sensation to light touch intact.  Deep tendon reflexes 1+ left patellar.  Otherwise, 2+ throughout.  Finger to nose testing intact.  ***   Metta Clines, DO  CC: [name], FNP

## 2022-11-08 ENCOUNTER — Ambulatory Visit: Payer: Medicare Other | Admitting: Neurology

## 2022-11-08 ENCOUNTER — Telehealth: Payer: Self-pay | Admitting: Anesthesiology

## 2022-11-08 NOTE — Telephone Encounter (Signed)
Patient called to cancel her appt today 11/08/22. She is sick with a cold/covid? Her next appt was scheduled for 02/08/2023. Patient requests a refill on her medication until she is able to come in.  1. Which medications need refilled? Primidone 50 mg   2. Which pharmacy/location is medication to be sent to? [facility] on [address] and [address]  3. Do they need a 30 day or 90 day supply? Did not say

## 2022-11-08 NOTE — Telephone Encounter (Signed)
Called pt back and no voice mail set up . We can not refill until she sees Dr. Tomi Likens. She has not seen him in one year. Has canceled on last two appointments. Has one in Feb of 2024 .

## 2022-11-09 ENCOUNTER — Telehealth (HOSPITAL_COMMUNITY): Payer: Medicare Other | Admitting: Psychiatry

## 2022-11-12 ENCOUNTER — Encounter (HOSPITAL_COMMUNITY): Payer: Self-pay | Admitting: Psychiatry

## 2022-11-12 ENCOUNTER — Telehealth (HOSPITAL_BASED_OUTPATIENT_CLINIC_OR_DEPARTMENT_OTHER): Payer: Medicare Other | Admitting: Psychiatry

## 2022-11-12 DIAGNOSIS — F5101 Primary insomnia: Secondary | ICD-10-CM | POA: Diagnosis not present

## 2022-11-12 DIAGNOSIS — F330 Major depressive disorder, recurrent, mild: Secondary | ICD-10-CM

## 2022-11-12 MED ORDER — ZOLPIDEM TARTRATE ER 12.5 MG PO TBCR: 12.5000 mg | EXTENDED_RELEASE_TABLET | Freq: Every evening | ORAL | 2 refills | Status: DC | PRN

## 2022-11-12 MED ORDER — BUPROPION HCL ER (XL) 300 MG PO TB24: 300.0000 mg | ORAL_TABLET | Freq: Every day | ORAL | 0 refills | Status: DC

## 2022-11-12 MED ORDER — SERTRALINE HCL 100 MG PO TABS: 100.0000 mg | ORAL_TABLET | Freq: Every day | ORAL | 0 refills | Status: DC

## 2022-11-12 NOTE — Progress Notes (Signed)
Virtual Visit via Video Note  I connected with Erin Bradford on 96/78/93 at  2:00 PM EST by a video enabled telemedicine application and verified that I am speaking with the correct person using two identifiers.  Location: Patient: Home Provider: Home Office   I discussed the limitations of evaluation and management by telemedicine and the availability of in person appointments. The patient expressed understanding and agreed to proceed.  History of Present Illness: Patient is evaluated by video session.  She is having cold symptoms.  She reported things are going okay but lately she noticed started to have skin biting around her nails.  She noticed few weeks ago.  Recently she started taking new medication lomera for weight loss.  She had lost more than 10 pounds since taking it.  It is prescribed by [facility] for weight loss.  She is also taking oxycodone prescribed by pain management.  She is wondering if she can increase further her medication as noticed her skin biting could be a manifestation of anxiety.  In the past she has taken phentermine that causes a lot of side effects.  Otherwise no new stress in her life.  Her daughter is doing well and is studying at [employer] in [place].  Her own school is going well and she is hoping to finish off at this time.  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  She denies any anger, panic attack, suicidal thoughts.  She denies any tremors.  She denies any hallucination or feeling of hopelessness or worthlessness.   Past Psychiatric History:  H/O depression.  No h/o inpatient, mania, psychosis or suicidal attempt.  Tried Prozac which worked but had weight gain.  Tried Vistaril, trazodone, Thorazine, Lunesta 3 mg, Doxepin 50 mg and saphris 5 mg with poor outcome.  Lamictal caused rash and temazepam caused headache.   Psychiatric Specialty Exam: Physical Exam  Review of Systems  Weight 251 lb (113.9 kg), last  menstrual period 09/17/2017.There is no height or weight on file to calculate BMI.  General Appearance: Casual  Eye Contact:  Good  Speech:  Clear and Coherent and Normal Rate  Volume:  Normal  Mood:  Euthymic  Affect:  Appropriate  Thought Process:  Goal Directed  Orientation:  Full (Time, Place, and Person)  Thought Content:  Logical  Suicidal Thoughts:  No  Homicidal Thoughts:  No  Memory:  Immediate;   Good Recent;   Good Remote;   Good  Judgement:  Good  Insight:  Present  Psychomotor Activity:  Normal  Concentration:  Concentration: Good and Attention Span: Good  Recall:  Good  Fund of Knowledge:  Good  Language:  Good  Akathisia:  No  Handed:  Right  AIMS (if indicated):     Assets:  Communication Skills Desire for Improvement Housing Social Support Talents/Skills Transportation  ADL's:  Intact  Cognition:  WNL  Sleep:   ok      Assessment and Plan: Major depressive disorder, recurrent.  Primary insomnia.  I reviewed current medication and blood work results.  Her CBC is normal, CMP normal, last hemoglobin A1c 6.2 and her lipid panel shows cholesterol 247 and LDL 159.  I talk about polypharmacy as patient taking Ambien CR 12.5, Zoloft 100 mg daily and Wellbutrin XL 300 mg daily.  She is also on oxycodone, muscle relaxant, new medication for weight loss.  I recommend should consider stopping the new medication for weight loss as it may be causing skin  biting as in the past she had side effects with phentermine.  I recommend we need to see first if any of the medicine causing the side effects.  She agreed with the plan.  I recommend to call us back if she has any question or any concern.  Otherwise will follow-up in 3 months.  Continue current medication.    Follow Up Instructions:    I discussed the assessment and treatment plan with the patient. The patient was provided an opportunity to ask questions and all were answered. The patient agreed with the plan and  demonstrated an understanding of the instructions.   The patient was advised to call back or seek an in-person evaluation if the symptoms worsen or if the condition fails to improve as anticipated.  Collaboration of Care: Other provider involved in patient's care AEB notes are available in epic to review.  Patient/Guardian was advised Release of Information must be obtained prior to any record release in order to collaborate their care with an outside provider. Patient/Guardian was advised if they have not already done so to contact the registration department to sign all necessary forms in order for [place] to release information regarding their care.   Consent: Patient/Guardian gives verbal consent for treatment and assignment of benefits for services provided during this visit. Patient/Guardian expressed understanding and agreed to proceed.    I provided 30 minutes of non-face-to-face time during this encounter.   [name], MD

## 2022-11-13 ENCOUNTER — Other Ambulatory Visit: Payer: Self-pay | Admitting: Neurology

## 2022-11-26 NOTE — Progress Notes (Unsigned)
Virtual Visit via Video Note  Consent was obtained for video visit:  Yes.   Answered questions that patient had about telehealth interaction:  Yes.   I discussed the limitations, risks, security and privacy concerns of performing an evaluation and management service by telemedicine. I also discussed with the patient that there may be a patient responsible charge related to this service. The patient expressed understanding and agreed to proceed.  Pt location: Home Physician Location: office Name of referring provider:  Kallie Locks, FNP I connected with Erin Bradford at patients initiation/request on 11/30/2022 at  9:30 AM EST by video enabled telemedicine application and verified that I am speaking with the correct person using two identifiers. Pt MRN:  [id number] Pt DOB:  [birth year] Video Participants:  Erin Bradford   Assessment/Plan:   Essential tremor  Refilled primidone 50mg  in morning and 300mg  at bedtime Follow up one year  Subjective:  Erin Bradford is a 51 year old right-handed female with hypertension, iron deficiency anemia, prediabetes, mild chronic kidney disease, and anxiety who follows up for essential tremor.   UPDATE: Changed to virtual visit because she is sick with COVID.  She currently takes primidone 50mg  in AM and 300mg  at bedtime.     She ran out of medication a couple of weeks ago, so tremors have increased.  They are controlled on the medication     HISTORY: Beginning in 2017, she reports tremor in both hands.  She only notices it when she is performing a task, but not at rest.  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  She also reports difficulty pouring liquids in a spoon or using utensils.  It does not affect writing.  She denies any triggers or anything that improves symptoms.  She thinks it has gotten a little worse over the past year.  She denies starting any new medication prior to onset of symptoms.  She  denies family history of tremor.  TSH was 0.92.     She endorses short term memory problems.  She frequently is experiencing word-finding difficulties.  This has been ongoing for about a year but has gotten worse over the past 3 months.  Other than the primidone, she has not had any changes or new medications.  She has not had trouble with finances or disorientation on familiar routes.  To evaluate memory deficits, B12 and TSH were checked, which were 663 and 1.05 respectively. It was determined that her memory problems were related to depression.  However, she subsequently felt it has gotten worse.  She reports that her mother has some memory issues but has not been diagnosed with dementia.  She reports that her grandmother developed some dementia at age 83.  She reports depression, anxiety and difficulty sleeping.   She was previously on topiramate for anxiety and overeating, but her psychiatrist stopped her.   Past Medical History: Past Medical History:  Diagnosis Date   Abnormal laboratory test    Acid reflux    Allergy    Anemia    Anxiety    Arthritis    Blood transfusion without reported diagnosis    BMI 39.0-39.9,adult    Chronic hypokalemia    Chronic kidney disease    mild   Depression    GERD (gastroesophageal reflux disease)    H/O: hysterectomy    Heart murmur    Hypertension    Insomnia    Low back pain    Obesity  Occasional tremors    Palpitations    dx with tachycardia    PONV (postoperative nausea and vomiting)    Tiredness    Urinary frequency     Medications: Outpatient Encounter Medications as of 11/30/2022  Medication Sig   albuterol (PROVENTIL) (2.5 MG/3ML) 0.083% nebulizer solution Take 3 mLs (2.5 mg total) by nebulization every 6 (six) hours as needed for wheezing or shortness of breath.   albuterol (VENTOLIN HFA) 108 (90 Base) MCG/ACT inhaler Inhale 2 puffs into the lungs every 6 (six) hours as needed for wheezing or shortness of breath.   AMITIZA  24 MCG capsule    buPROPion (WELLBUTRIN XL) 300 MG 24 hr tablet Take 1 tablet (300 mg total) by mouth daily.   carvedilol (COREG) 25 MG tablet Take 1 tablet (25 mg total) by mouth 2 (two) times daily.   cetirizine (ZYRTEC) 10 MG tablet TAKE 1 TABLET(10 MG) BY MOUTH AT BEDTIME AS NEEDED FOR ALLERGIES   famotidine (PEPCID) 20 MG tablet TAKE 1 TABLET(20 MG) BY MOUTH TWICE DAILY   fluticasone (FLONASE) 50 MCG/ACT nasal spray SHAKE LIQUID AND USE 2 SPRAYS IN EACH NOSTRIL DAILY   furosemide (LASIX) 20 MG tablet Take 20 mg by mouth 2 (two) times daily.    gabapentin (NEURONTIN) 300 MG capsule Take 2 capsules by mouth 3 (three) times daily.   Glucose Blood (BLOOD GLUCOSE TEST STRIPS) STRP    LOMAIRA 8 MG TABS Take 1 tablet by mouth 2 (two) times daily.   losartan (COZAAR) 100 MG tablet Take 1 tablet (100 mg total) by mouth daily.   NARCAN 4 MG/0.1ML LIQD nasal spray kit Place 1 spray into the nose as directed.   nirmatrelvir/ritonavir EUA (PAXLOVID) 20 x 150 MG & 10 x 100MG  TABS Take 3 tablets by mouth 2 (two) times daily for 5 days. (Take nirmatrelvir 150 mg two tablets twice daily for 5 days and ritonavir 100 mg one tablet twice daily for 5 days) Patient GFR is 72   oxyCODONE-acetaminophen (PERCOCET) 10-325 MG tablet Take 1 tablet by mouth every 8 (eight) hours as needed for pain.   primidone (MYSOLINE) 50 MG tablet TAKE 1 TABLET BY MOUTH EVERY MORNING AND 6 TABLETS BY MOUTH EVERY EVENING   sertraline (ZOLOFT) 100 MG tablet Take 1 tablet (100 mg total) by mouth daily.   spironolactone (ALDACTONE) 100 MG tablet Take 100 mg by mouth 2 (two) times daily.   tiZANidine (ZANAFLEX) 4 MG tablet Take 4 mg by mouth 3 (three) times daily.   zolpidem (AMBIEN CR) 12.5 MG CR tablet Take 1 tablet (12.5 mg total) by mouth at bedtime as needed for sleep.   [DISCONTINUED] albuterol (PROVENTIL) (2.5 MG/3ML) 0.083% nebulizer solution Take 3 mLs (2.5 mg total) by nebulization every 6 (six) hours as needed for wheezing or  shortness of breath.   [DISCONTINUED] albuterol (VENTOLIN HFA) 108 (90 Base) MCG/ACT inhaler Inhale 2 puffs into the lungs every 6 (six) hours as needed for wheezing or shortness of breath.   [DISCONTINUED] primidone (MYSOLINE) 50 MG tablet TAKE 1 TABLET BY MOUTH EVERY MORNING AND 6 TABLETS BY MOUTH EVERY EVENING   No facility-administered encounter medications on file as of 11/30/2022.    Allergies: Allergies  Allergen Reactions   Ace Inhibitors Anaphylaxis and Swelling    Swelling of tongue.    Family History: Family History  Problem Relation Age of Onset   Diabetes Mother    Hypertension Mother    Hyperlipidemia Mother    Sarcoidosis Mother  Kidney disease Father    Atrial fibrillation Sister    Kidney disease Maternal Grandmother    Pancreatic cancer Maternal Grandmother    Diabetes Maternal Grandmother    Hypertension Maternal Grandmother    Cancer Maternal Grandfather    Colon cancer Neg Hx    Colon polyps Neg Hx    Esophageal cancer Neg Hx    Stomach cancer Neg Hx    Rectal cancer Neg Hx     Observations/Objective:   No acute distress.  Alert and oriented.  Speech fluent and not dysarthric.  Language intact. .   Follow Up Instructions:    -I discussed the assessment and treatment plan with the patient. The patient was provided an opportunity to ask questions and all were answered. The patient agreed with the plan and demonstrated an understanding of the instructions.   The patient was advised to call back or seek an in-person evaluation if the symptoms worsen or if the condition fails to improve as anticipated.   Cira Servant, DO

## 2022-11-29 ENCOUNTER — Telehealth: Payer: Medicare Other | Admitting: Physician Assistant

## 2022-11-29 DIAGNOSIS — U071 COVID-19: Secondary | ICD-10-CM

## 2022-11-29 DIAGNOSIS — J4541 Moderate persistent asthma with (acute) exacerbation: Secondary | ICD-10-CM

## 2022-11-29 MED ORDER — ALBUTEROL SULFATE HFA 108 (90 BASE) MCG/ACT IN AERS: 2.0000 | INHALATION_SPRAY | Freq: Four times a day (QID) | RESPIRATORY_TRACT | 0 refills | Status: DC | PRN

## 2022-11-29 MED ORDER — NIRMATRELVIR/RITONAVIR (PAXLOVID)TABLET: 3.0000 | ORAL_TABLET | Freq: Two times a day (BID) | ORAL | 0 refills | Status: AC

## 2022-11-29 MED ORDER — ALBUTEROL SULFATE (2.5 MG/3ML) 0.083% IN NEBU: 2.5000 mg | INHALATION_SOLUTION | Freq: Four times a day (QID) | RESPIRATORY_TRACT | 0 refills | Status: DC | PRN

## 2022-11-29 NOTE — Patient Instructions (Signed)
Erin Bradford, thank you for joining Mar Daring, PA-C for today's virtual visit.  While this provider is not your primary care provider (PCP), if your PCP is located in our provider database this encounter information will be shared with them immediately following your visit.   [facility] account gives you access to today's visit and all your visits, tests, and labs performed at [facility] " click here if you don't have a [facility] account or go to [email or web address]  Consent: (Patient) Erin Bradford provided verbal consent for this virtual visit at the beginning of the encounter.  Current Medications:  Current Outpatient Medications:    nirmatrelvir/ritonavir EUA (PAXLOVID) 20 x 150 MG & 10 x 100MG TABS, Take 3 tablets by mouth 2 (two) times daily for 5 days. (Take nirmatrelvir 150 mg two tablets twice daily for 5 days and ritonavir 100 mg one tablet twice daily for 5 days) Patient GFR is 72, Disp: 30 tablet, Rfl: 0   albuterol (PROVENTIL) (2.5 MG/3ML) 0.083% nebulizer solution, Take 3 mLs (2.5 mg total) by nebulization every 6 (six) hours as needed for wheezing or shortness of breath., Disp: 75 mL, Rfl: 0   albuterol (VENTOLIN HFA) 108 (90 Base) MCG/ACT inhaler, Inhale 2 puffs into the lungs every 6 (six) hours as needed for wheezing or shortness of breath., Disp: 18 g, Rfl: 0   AMITIZA 24 MCG capsule, , Disp: , Rfl:    buPROPion (WELLBUTRIN XL) 300 MG 24 hr tablet, Take 1 tablet (300 mg total) by mouth daily., Disp: 90 tablet, Rfl: 0   carvedilol (COREG) 25 MG tablet, Take 1 tablet (25 mg total) by mouth 2 (two) times daily., Disp: 180 tablet, Rfl: 3   cetirizine (ZYRTEC) 10 MG tablet, TAKE 1 TABLET(10 MG) BY MOUTH AT BEDTIME AS NEEDED FOR ALLERGIES, Disp: 30 tablet, Rfl: 1   famotidine (PEPCID) 20 MG tablet, TAKE 1 TABLET(20 MG) BY MOUTH TWICE DAILY, Disp: 60 tablet, Rfl: 3   fluticasone (FLONASE) 50 MCG/ACT nasal spray, SHAKE LIQUID  AND USE 2 SPRAYS IN EACH NOSTRIL DAILY, Disp: 48 g, Rfl: 3   furosemide (LASIX) 20 MG tablet, Take 20 mg by mouth 2 (two) times daily. , Disp: , Rfl:    gabapentin (NEURONTIN) 300 MG capsule, Take 2 capsules by mouth 3 (three) times daily., Disp: , Rfl:    Glucose Blood (BLOOD GLUCOSE TEST STRIPS) STRP, , Disp: , Rfl:    LOMAIRA 8 MG TABS, Take 1 tablet by mouth 2 (two) times daily., Disp: , Rfl:    losartan (COZAAR) 100 MG tablet, Take 1 tablet (100 mg total) by mouth daily., Disp: 90 tablet, Rfl: 3   NARCAN 4 MG/0.1ML LIQD nasal spray kit, Place 1 spray into the nose as directed., Disp: , Rfl:    oxyCODONE-acetaminophen (PERCOCET) 10-325 MG tablet, Take 1 tablet by mouth every 8 (eight) hours as needed for pain., Disp: , Rfl:    primidone (MYSOLINE) 50 MG tablet, TAKE 1 TABLET BY MOUTH EVERY MORNING AND 6 TABLETS BY MOUTH EVERY EVENING, Disp: 630 tablet, Rfl: 3   sertraline (ZOLOFT) 100 MG tablet, Take 1 tablet (100 mg total) by mouth daily., Disp: 90 tablet, Rfl: 0   spironolactone (ALDACTONE) 100 MG tablet, Take 100 mg by mouth 2 (two) times daily., Disp: , Rfl:    tiZANidine (ZANAFLEX) 4 MG tablet, Take 4 mg by mouth 3 (three) times daily., Disp: , Rfl:    zolpidem (AMBIEN CR) 12.5 MG CR  tablet, Take 1 tablet (12.5 mg total) by mouth at bedtime as needed for sleep., Disp: 30 tablet, Rfl: 2   Medications ordered in this encounter:  Meds ordered this encounter  Medications   albuterol (VENTOLIN HFA) 108 (90 Base) MCG/ACT inhaler    Sig: Inhale 2 puffs into the lungs every 6 (six) hours as needed for wheezing or shortness of breath.    Dispense:  18 g    Refill:  0    Order Specific Question:   Supervising Provider    Answer:   Chase Picket [id number]   albuterol (PROVENTIL) (2.5 MG/3ML) 0.083% nebulizer solution    Sig: Take 3 mLs (2.5 mg total) by nebulization every 6 (six) hours as needed for wheezing or shortness of breath.    Dispense:  75 mL    Refill:  0    Order Specific  Question:   Supervising Provider    Answer:   Chase Picket [id number]   nirmatrelvir/ritonavir EUA (PAXLOVID) 20 x 150 MG & 10 x 100MG TABS    Sig: Take 3 tablets by mouth 2 (two) times daily for 5 days. (Take nirmatrelvir 150 mg two tablets twice daily for 5 days and ritonavir 100 mg one tablet twice daily for 5 days) Patient GFR is 72    Dispense:  30 tablet    Refill:  0    Order Specific Question:   Supervising Provider    Answer:   Chase Picket [id number]     *If you need refills on other medications prior to your next appointment, please contact your pharmacy*  Follow-Up: Call back or seek an in-person evaluation if the symptoms worsen or if the condition fails to improve as anticipated.  [place] [phone]  Other Instructions COVID-19 COVID-19, or coronavirus disease 2019, is an infection that is caused by a new (novel) coronavirus called SARS-CoV-2. COVID-19 can cause many symptoms. In some people, the virus may not cause any symptoms. In others, it may cause mild or severe symptoms. Some people with severe infection develop severe disease. What are the causes? This illness is caused by a virus. The virus may be in the air as tiny specks of fluid (aerosols) or droplets, or it may be on surfaces. You may catch the virus by: Breathing in droplets from an infected person. Droplets can be spread by a person breathing, speaking, singing, coughing, or sneezing. Touching something, like a table or a doorknob, that has virus on it (is contaminated) and then touching your mouth, nose, or eyes. What increases the risk? Risk for infection: You are more likely to get infected with the COVID-19 virus if: You are within 6 ft (1.8 m) of a person with COVID-19 for 15 minutes or longer. You are providing care for a person who is infected with COVID-19. You are in close personal contact with other people. Close personal contact includes hugging, kissing, or sharing  eating or drinking utensils. Risk for serious illness caused by COVID-19: You are more likely to get seriously ill from the COVID-19 virus if: You have cancer. You have a long-term (chronic) disease, such as: Chronic lung disease. This includes pulmonary embolism, chronic obstructive pulmonary disease, and cystic fibrosis. Long-term disease that lowers your body's ability to fight infection (immunocompromise). Serious cardiac conditions, such as heart failure, coronary artery disease, or cardiomyopathy. Diabetes. Chronic kidney disease. Liver diseases. These include cirrhosis, nonalcoholic fatty liver disease, alcoholic liver disease, or autoimmune hepatitis. You have obesity. You  are pregnant or were recently pregnant. You have sickle cell disease. What are the signs or symptoms? Symptoms of this condition can range from mild to severe. Symptoms may appear any time from 2 to 14 days after being exposed to the virus. They include: Fever or chills. Shortness of breath or trouble breathing. Feeling tired or very tired. Headaches, body aches, or muscle aches. Runny or stuffy nose, sneezing, coughing, or sore throat. New loss of taste or smell. This is rare. Some people may also have stomach problems, such as nausea, vomiting, or diarrhea. Other people may not have any symptoms of COVID-19. How is this diagnosed? This condition may be diagnosed by testing samples to check for the COVID-19 virus. The most common tests are the PCR test and the antigen test. Tests may be done in the lab or at home. They include: Using a swab to take a sample of fluid from the back of your nose and throat (nasopharyngeal fluid), from your nose, or from your throat. Testing a sample of saliva from your mouth. Testing a sample of coughed-up mucus from your lungs (sputum). How is this treated? Treatment for COVID-19 infection depends on the severity of the condition. Mild symptoms can be managed at home with  rest, fluids, and over-the-counter medicines. Serious symptoms may be treated in a hospital intensive care unit (ICU). Treatment in the ICU may include: Supplemental oxygen. Extra oxygen is given through a tube in the nose, a face mask, or a hood. Medicines. These may include: Antivirals, such as monoclonal antibodies. These help your body fight off certain viruses that can cause disease. Anti-inflammatories, such as corticosteroids. These reduce inflammation and suppress the immune system. Antithrombotics. These prevent or treat blood clots, if they develop. Convalescent plasma. This helps boost your immune system, if you have an underlying immunosuppressive condition or are getting immunosuppressive treatments. Prone positioning. This means you will lie on your stomach. This helps oxygen to get into your lungs. Infection control measures. If you are at risk for more serious illness caused by COVID-19, your health care provider may prescribe two long-acting monoclonal antibodies, given together every 6 months. How is this prevented? To protect yourself: Use preventive medicine (pre-exposure prophylaxis). You may get pre-exposure prophylaxis if you have moderate or severe immunocompromise. Get vaccinated. Anyone 41 months old or older who meets guidelines can get a COVID-19 vaccine or vaccine series. This includes people who are pregnant or making breast milk (lactating). Get an added dose of COVID-19 vaccine after your first vaccine or vaccine series if you have moderate to severe immunocompromise. This applies if you have had a solid organ transplant or have been diagnosed with an immunocompromising condition. You should get the added dose 4 weeks after you got the first COVID-19 vaccine or vaccine series. If you get an mRNA vaccine, you will need a 3-dose primary series. If you get the J&J/Janssen vaccine, you will need a 2-dose primary series, with the second dose being an mRNA vaccine. Talk to  your health care provider about getting experimental monoclonal antibodies. This treatment is approved under emergency use authorization to prevent severe illness before or after being exposed to the COVID-19 virus. You may be given monoclonal antibodies if: You have moderate or severe immunocompromise. This includes treatments that lower your immune response. People with immunocompromise may not develop protection against COVID-19 when they are vaccinated. You cannot be vaccinated. You may not get a vaccine if you have a severe allergic reaction to the vaccine or  its components. You are not fully vaccinated. You are in a facility where COVID-19 is present and: Are in close contact with a person who is infected with the COVID-19 virus. Are at high risk of being exposed to the COVID-19 virus. You are at risk of illness from new variants of the COVID-19 virus. To protect others: If you have symptoms of COVID-19, take steps to prevent the virus from spreading to others. Stay home. Leave your house only to get medical care. Do not use public transit, if possible. Do not travel while you are sick. Wash your hands often with soap and water for at least 20 seconds. If soap and water are not available, use alcohol-based hand sanitizer. Make sure that all people in your household wash their hands well and often. Cough or sneeze into a tissue or your sleeve or elbow. Do not cough or sneeze into your hand or into the air. Where to find more information Centers for Disease Control and Prevention: CharmCourses.be World Health Organization: https://www.castaneda.info/ Get help right away if: You have trouble breathing. You have pain or pressure in your chest. You are confused. You have bluish lips and fingernails. You have trouble waking from sleep. You have symptoms that get worse. These symptoms may be an emergency. Get help right away. Call 911. Do not wait to see if the symptoms  will go away. Do not drive yourself to the hospital. Summary COVID-19 is an infection that is caused by a new coronavirus. Sometimes, there are no symptoms. Other times, symptoms range from mild to severe. Some people with a severe COVID-19 infection develop severe disease. The virus that causes COVID-19 can spread from person to person through droplets or aerosols from breathing, speaking, singing, coughing, or sneezing. Mild symptoms of COVID-19 can be managed at home with rest, fluids, and over-the-counter medicines. This information is not intended to replace advice given to you by your health care provider. Make sure you discuss any questions you have with your health care provider. Document Revised: 12/01/2021 Document Reviewed: 12/03/2021 Elsevier Patient Education  [place].    If you have been instructed to have an in-person evaluation today at a local Urgent Care facility, please use the link below. It will take you to a list of all of our available [facility], including address, phone number and hours of operation. Please do not delay care.  [facility]  If you or a family member do not have a primary care provider, use the link below to schedule a visit and establish care. When you choose a [facility] primary care physician or advanced practice provider, you gain a long-term partner in health. Find a Primary Care Provider  Learn more about [facility]'s in-office and virtual care options: [facility]

## 2022-11-29 NOTE — Progress Notes (Signed)
Virtual Visit Consent   Erin Bradford, you are scheduled for a virtual visit with a [facility] provider today. Just as with appointments in the office, your consent must be obtained to participate. Your consent will be active for this visit and any virtual visit you may have with one of our providers in the next 365 days. If you have a MyChart account, a copy of this consent can be sent to you electronically.  As this is a virtual visit, video technology does not allow for your provider to perform a traditional examination. This may limit your provider's ability to fully assess your condition. If your provider identifies any concerns that need to be evaluated in person or the need to arrange testing (such as labs, EKG, etc.), we will make arrangements to do so. Although advances in technology are sophisticated, we cannot ensure that it will always work on either your end or our end. If the connection with a video visit is poor, the visit may have to be switched to a telephone visit. With either a video or telephone visit, we are not always able to ensure that we have a secure connection.  By engaging in this virtual visit, you consent to the provision of healthcare and authorize for your insurance to be billed (if applicable) for the services provided during this visit. Depending on your insurance coverage, you may receive a charge related to this service.  I need to obtain your verbal consent now. Are you willing to proceed with your visit today? Erin Bradford has provided verbal consent on 11/29/2022 for a virtual visit (video or telephone). Mar Daring, PA-C  Date: 11/29/2022 10:42 AM  Virtual Visit via Video Note   I, Mar Daring, connected with  Erin Bradford  ([id number], 22-Jan-1971) on 11/29/22 at 10:30 AM EST by a video-enabled telemedicine application and verified that I am speaking with the correct person using two identifiers.  Location: Patient: Virtual  Visit Location Patient: Home Provider: Virtual Visit Location Provider: Home Office   I discussed the limitations of evaluation and management by telemedicine and the availability of in person appointments. The patient expressed understanding and agreed to proceed.    History of Present Illness: Erin Bradford is a 51 y.o. who identifies as a female who was assigned female at birth, and is being seen today for Covid 24.  HPI: URI  This is a new problem. The current episode started in the past 7 days (Tested positive for Covid 19 on 11/27/22; symptoms started same day). The problem has been gradually worsening. Maximum temperature: subjective fevers. Associated symptoms include chest pain (tightness), congestion, coughing, headaches, joint pain, rhinorrhea, sinus pain and a sore throat. Pertinent negatives include no diarrhea, ear pain, nausea, plugged ear sensation or vomiting. Associated symptoms comments: post nasal drainage, back pain. Treatments tried: cold and flu medications, nebulizer medications. The treatment provided no relief.     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    Medications:  Current Outpatient Medications:    nirmatrelvir/ritonavir EUA (PAXLOVID) 20 x 150 MG & 10 x 100MG TABS, Take 3 tablets by mouth 2 (two) times daily for 5 days. (Take nirmatrelvir 150 mg two tablets twice daily for 5 days and ritonavir 100 mg one tablet twice daily for 5 days) Patient GFR is 72, Disp: 30 tablet, Rfl: 0   albuterol (PROVENTIL) (2.5 MG/3ML) 0.083% nebulizer solution, Take 3 mLs (2.5 mg total) by nebulization every 6 (six) hours as needed for wheezing or shortness of breath., Disp: 75 mL, Rfl: 0   albuterol (VENTOLIN HFA) 108 (90 Base) MCG/ACT inhaler, Inhale 2 puffs into the lungs every 6 (six) hours as needed for wheezing or shortness of breath., Disp: 18 g, Rfl: 0   AMITIZA 24 MCG capsule, , Disp: , Rfl:    buPROPion (WELLBUTRIN XL) 300 MG 24 hr tablet, Take 1 tablet (300 mg total) by mouth daily., Disp: 90 tablet, Rfl: 0   carvedilol (COREG) 25 MG tablet, Take 1 tablet (25 mg total) by mouth 2 (two) times daily., Disp: 180 tablet, Rfl: 3   cetirizine (ZYRTEC) 10 MG tablet, TAKE 1 TABLET(10 MG) BY MOUTH AT BEDTIME AS NEEDED FOR ALLERGIES, Disp: 30 tablet, Rfl: 1   famotidine (PEPCID) 20 MG tablet, TAKE 1 TABLET(20 MG) BY MOUTH TWICE DAILY, Disp: 60 tablet, Rfl: 3   fluticasone (FLONASE) 50 MCG/ACT nasal spray, SHAKE LIQUID AND USE 2 SPRAYS IN EACH NOSTRIL DAILY, Disp: 48 g, Rfl: 3   furosemide (LASIX) 20 MG tablet, Take 20 mg by mouth 2 (two) times daily. , Disp: , Rfl:    gabapentin (NEURONTIN) 300 MG capsule, Take 2 capsules by mouth 3 (three) times daily., Disp: , Rfl:    Glucose Blood (BLOOD GLUCOSE TEST STRIPS) STRP, , Disp: , Rfl:    LOMAIRA 8 MG TABS, Take 1 tablet by mouth 2 (two) times daily., Disp: , Rfl:    losartan (COZAAR) 100 MG tablet, Take 1 tablet (100 mg total) by mouth daily., Disp: 90 tablet, Rfl: 3   NARCAN 4 MG/0.1ML LIQD nasal spray kit, Place 1 spray into the nose as directed., Disp: , Rfl:    oxyCODONE-acetaminophen (PERCOCET) 10-325 MG  tablet, Take 1 tablet by mouth every 8 (eight) hours as needed for pain., Disp: , Rfl:    primidone (MYSOLINE) 50 MG tablet, TAKE 1 TABLET BY MOUTH EVERY MORNING AND 6 TABLETS BY MOUTH EVERY EVENING, Disp: 630 tablet, Rfl: 3   sertraline (ZOLOFT) 100 MG tablet, Take 1 tablet (100 mg total) by mouth daily., Disp: 90 tablet, Rfl: 0   spironolactone (ALDACTONE) 100 MG tablet, Take 100 mg by mouth 2 (two) times daily., Disp: , Rfl:    tiZANidine (ZANAFLEX) 4 MG tablet, Take 4 mg by mouth 3 (three) times daily., Disp: , Rfl:    zolpidem (AMBIEN CR) 12.5 MG CR tablet, Take 1 tablet (12.5 mg total) by mouth at bedtime as needed for sleep., Disp: 30 tablet, Rfl: 2  Observations/Objective: Patient is well-developed, well-nourished in no acute distress.  Resting comfortably at home.  Head is normocephalic, atraumatic.  No labored breathing.  Speech is clear and coherent with logical content.  Patient is alert and  oriented at baseline.    Assessment and Plan: 1. COVID-19 - nirmatrelvir/ritonavir EUA (PAXLOVID) 20 x 150 MG & 10 x 100MG TABS; Take 3 tablets by mouth 2 (two) times daily for 5 days. (Take nirmatrelvir 150 mg two tablets twice daily for 5 days and ritonavir 100 mg one tablet twice daily for 5 days) Patient GFR is 72  Dispense: 30 tablet; Refill: 0  2. Moderate persistent asthma with acute exacerbation - albuterol (VENTOLIN HFA) 108 (90 Base) MCG/ACT inhaler; Inhale 2 puffs into the lungs every 6 (six) hours as needed for wheezing or shortness of breath.  Dispense: 18 g; Refill: 0 - albuterol (PROVENTIL) (2.5 MG/3ML) 0.083% nebulizer solution; Take 3 mLs (2.5 mg total) by nebulization every 6 (six) hours as needed for wheezing or shortness of breath.  Dispense: 75 mL; Refill: 0  - Continue OTC symptomatic management of choice - Will send OTC vitamins and supplement information through AVS - Paxlovid prescribed - Patient enrolled in MyChart symptom monitoring - Push fluids - Rest as  needed - Discussed return precautions and when to seek in-person evaluation, sent via AVS as well   Follow Up Instructions: I discussed the assessment and treatment plan with the patient. The patient was provided an opportunity to ask questions and all were answered. The patient agreed with the plan and demonstrated an understanding of the instructions.  A copy of instructions were sent to the patient via MyChart unless otherwise noted below.    The patient was advised to call back or seek an in-person evaluation if the symptoms worsen or if the condition fails to improve as anticipated.  Time:  I spent 11 minutes with the patient via telehealth technology discussing the above problems/concerns.    Mar Daring, PA-C

## 2022-11-30 ENCOUNTER — Telehealth (INDEPENDENT_AMBULATORY_CARE_PROVIDER_SITE_OTHER): Payer: Medicare Other | Admitting: Neurology

## 2022-11-30 ENCOUNTER — Encounter: Payer: Self-pay | Admitting: Neurology

## 2022-11-30 DIAGNOSIS — G250 Essential tremor: Secondary | ICD-10-CM

## 2022-11-30 MED ORDER — PRIMIDONE 50 MG PO TABS: ORAL_TABLET | ORAL | 3 refills | Status: DC

## 2022-11-30 NOTE — Patient Instructions (Signed)
Refilled primidone Call office to schedule follow up in one year - [phone]

## 2023-01-17 ENCOUNTER — Encounter: Payer: Self-pay | Admitting: Oncology

## 2023-02-08 ENCOUNTER — Ambulatory Visit: Payer: Medicare Other | Admitting: Neurology

## 2023-02-09 ENCOUNTER — Encounter: Payer: Self-pay | Admitting: Oncology

## 2023-02-16 ENCOUNTER — Telehealth (HOSPITAL_BASED_OUTPATIENT_CLINIC_OR_DEPARTMENT_OTHER): Payer: 59 | Admitting: Psychiatry

## 2023-02-16 ENCOUNTER — Encounter (HOSPITAL_COMMUNITY): Payer: Self-pay | Admitting: Psychiatry

## 2023-02-16 ENCOUNTER — Other Ambulatory Visit: Payer: Self-pay

## 2023-02-16 VITALS — BP 121/77 | Wt 252.0 lb

## 2023-02-16 DIAGNOSIS — F411 Generalized anxiety disorder: Secondary | ICD-10-CM | POA: Diagnosis not present

## 2023-02-16 DIAGNOSIS — F330 Major depressive disorder, recurrent, mild: Secondary | ICD-10-CM | POA: Diagnosis not present

## 2023-02-16 DIAGNOSIS — F5101 Primary insomnia: Secondary | ICD-10-CM | POA: Diagnosis not present

## 2023-02-16 MED ORDER — CARVEDILOL 25 MG PO TABS: 25.0000 mg | ORAL_TABLET | Freq: Two times a day (BID) | ORAL | 2 refills | Status: DC

## 2023-02-16 MED ORDER — SERTRALINE HCL 100 MG PO TABS: 100.0000 mg | ORAL_TABLET | Freq: Every day | ORAL | 0 refills | Status: DC

## 2023-02-16 MED ORDER — BUPROPION HCL ER (XL) 300 MG PO TB24: 300.0000 mg | ORAL_TABLET | Freq: Every day | ORAL | 0 refills | Status: DC

## 2023-02-16 MED ORDER — ZOLPIDEM TARTRATE ER 12.5 MG PO TBCR: 12.5000 mg | EXTENDED_RELEASE_TABLET | Freq: Every evening | ORAL | 2 refills | Status: DC | PRN

## 2023-02-16 NOTE — Progress Notes (Signed)
[name] Virtual Progress Note   Patient Location: Home Provider Location: Home Office  I connect with patient by video and verified that I am speaking with correct person by using two identifiers. I discussed the limitations of evaluation and management by telemedicine and the availability of in person appointments. I also discussed with the patient that there may be a patient responsible charge related to this service. The patient expressed understanding and agreed to proceed.  Erin Bradford [id number] 52 y.o.  02/16/2023 9:56 AM    History of Present Illness:  Patient is evaluated by video session.  She is taking her medication as prescribed.  She had good and quite half the days.  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  Patient told she had to do 2 more years.  Patient told school was stressful and she is pleased taking a break.  She still have skin biting around her nails when she feels very anxious.  She stopped weight loss medicine which we recommended if that was causing skin biting.  Patient has not seen any significant improvement.  Patient reported anxiety is not specific as he generally feels anxious about health, about her mother about her future and about her daughter.  However she denies any major panic attack.  She sleeps good with the help of the Ambien.  Her daughter is at [employer] as a Museum/gallery exhibitions officer.  Patient denies any mania, psychosis, crying spells, feeling of hopelessness or worthlessness.  Her appetite is okay.  Her weight is stable.  She has no major concern from the medication.  She is compliant with Zoloft, Wellbutrin.  She does go outside with the dog but no regular schedule for exercise.  She does not see therapist.  She is also on pain medicine and muscle relaxant.  Past Psychiatric History:  H/O depression.  No h/o inpatient, mania, psychosis or suicidal  attempt.  Tried Prozac which worked but had weight gain.  Tried Vistaril, trazodone, Thorazine, Lunesta 3 mg, Doxepin 50 mg and saphris 5 mg with poor outcome.  Lamictal caused rash and temazepam caused headache.    Outpatient Encounter Medications as of 02/16/2023  Medication Sig   albuterol (PROVENTIL) (2.5 MG/3ML) 0.083% nebulizer solution Take 3 mLs (2.5 mg total) by nebulization every 6 (six) hours as needed for wheezing or shortness of breath.   albuterol (VENTOLIN HFA) 108 (90 Base) MCG/ACT inhaler Inhale 2 puffs into the lungs every 6 (six) hours as needed for wheezing or shortness of breath.   AMITIZA 24 MCG capsule    buPROPion (WELLBUTRIN XL) 300 MG 24 hr tablet Take 1 tablet (300 mg total) by mouth daily.   carvedilol (COREG) 25 MG tablet Take 1 tablet (25 mg total) by mouth 2 (two) times daily.   cetirizine (ZYRTEC) 10 MG tablet TAKE 1 TABLET(10 MG) BY MOUTH AT BEDTIME AS NEEDED FOR ALLERGIES   famotidine (PEPCID) 20 MG tablet TAKE 1 TABLET(20 MG) BY MOUTH TWICE DAILY   fluticasone (FLONASE) 50 MCG/ACT nasal spray SHAKE LIQUID AND USE 2 SPRAYS IN EACH NOSTRIL DAILY   furosemide (LASIX) 20 MG tablet Take 20 mg by mouth 2 (two) times daily.    gabapentin (NEURONTIN) 300 MG capsule Take 2 capsules by mouth 3 (three) times daily.   Glucose Blood (BLOOD GLUCOSE TEST STRIPS) STRP    LOMAIRA 8 MG TABS Take 1 tablet by mouth 2 (two) times  daily.   losartan (COZAAR) 100 MG tablet Take 1 tablet (100 mg total) by mouth daily.   NARCAN 4 MG/0.1ML LIQD nasal spray kit Place 1 spray into the nose as directed.   oxyCODONE-acetaminophen (PERCOCET) 10-325 MG tablet Take 1 tablet by mouth every 8 (eight) hours as needed for pain.   primidone (MYSOLINE) 50 MG tablet TAKE 1 TABLET BY MOUTH EVERY MORNING AND 6 TABLETS BY MOUTH EVERY EVENING   sertraline (ZOLOFT) 100 MG tablet Take 1 tablet (100 mg total) by mouth daily.   spironolactone (ALDACTONE) 100 MG tablet Take 100 mg by mouth 2 (two) times daily.    tiZANidine (ZANAFLEX) 4 MG tablet Take 4 mg by mouth 3 (three) times daily.   zolpidem (AMBIEN CR) 12.5 MG CR tablet Take 1 tablet (12.5 mg total) by mouth at bedtime as needed for sleep.   No facility-administered encounter medications on file as of 02/16/2023.    No results found for this or any previous visit (from the past 2160 hour(s)).   Psychiatric Specialty Exam: Physical Exam  Review of Systems  Blood pressure 121/77, weight 252 lb (114.3 kg), last menstrual period 09/17/2017.There is no height or weight on file to calculate BMI.  General Appearance: Casual  Eye Contact:  Good  Speech:  Clear and Coherent and Normal Rate  Volume:  Normal  Mood:  Euthymic  Affect:  Appropriate  Thought Process:  Goal Directed  Orientation:  Full (Time, Place, and Person)  Thought Content:  WDL  Suicidal Thoughts:  No  Homicidal Thoughts:  No  Memory:  Immediate;   Good Recent;   Good Remote;   Good  Judgement:  Good  Insight:  Good  Psychomotor Activity:  Normal  Concentration:  Concentration: Good and Attention Span: Good  Recall:  Good  Fund of Knowledge:  Good  Language:  Good  Akathisia:  No  Handed:  Right  AIMS (if indicated):     Assets:  Communication Skills Desire for Improvement Housing Resilience Social Support Transportation  ADL's:  Intact  Cognition:  WNL  Sleep:  ok     Assessment/Plan: MDD (major depressive disorder), recurrent episode, mild (HCC) - Plan: buPROPion (WELLBUTRIN XL) 300 MG 24 hr tablet, sertraline (ZOLOFT) 100 MG tablet  Primary insomnia - Plan: zolpidem (AMBIEN CR) 12.5 MG CR tablet  GAD (generalized anxiety disorder) - Plan: sertraline (ZOLOFT) 100 MG tablet  Discussed current medication and polypharmacy.  I encourage consider therapy if her anxiety is not under control.  The patient understands that current medicine is working but still have skin biting.  She is hoping taking a break from the school help her anxiety.  I also encourage  exercise and regular walk may help her anxiety.  She is not taking weight loss medicine and I recommend if that is not causing skin biting the end she may want to resume the medication.  We agreed that we will keep the same medication.  Continue Ambien CR 12.5 mg daily, Zoloft 100 mg daily and Wellbutrin XL 300 mg daily.  Recommended to call us back if she has any question or any concern.  Follow-up in 3 months.   Follow Up Instructions:     I discussed the assessment and treatment plan with the patient. The patient was provided an opportunity to ask questions and all were answered. The patient agreed with the plan and demonstrated an understanding of the instructions.   The patient was advised to call back or seek an in-person  evaluation if the symptoms worsen or if the condition fails to improve as anticipated.    Collaboration of Care: Other provider involved in patient's care AEB notes are available in epic to review.  Patient/Guardian was advised Release of Information must be obtained prior to any record release in order to collaborate their care with an outside provider. Patient/Guardian was advised if they have not already done so to contact the registration department to sign all necessary forms in order for [place] to release information regarding their care.   Consent: Patient/Guardian gives verbal consent for treatment and assignment of benefits for services provided during this visit. Patient/Guardian expressed understanding and agreed to proceed.     I provided 24 minutes of non face to face time during this encounter.  [name], MD 02/16/2023

## 2023-02-23 ENCOUNTER — Encounter: Payer: Self-pay | Admitting: Oncology

## 2023-02-25 ENCOUNTER — Encounter: Payer: Self-pay | Admitting: Oncology

## 2023-02-28 ENCOUNTER — Ambulatory Visit: Payer: Self-pay | Admitting: Nurse Practitioner

## 2023-03-01 ENCOUNTER — Ambulatory Visit: Payer: Self-pay | Admitting: Nurse Practitioner

## 2023-03-08 ENCOUNTER — Telehealth (HOSPITAL_COMMUNITY): Payer: Self-pay | Admitting: *Deleted

## 2023-03-08 NOTE — Telephone Encounter (Signed)
zolpidem (AMBIEN CR) 12.5 MG CR tablet   72 hr Determination Status     patient notified

## 2023-03-18 ENCOUNTER — Telehealth (HOSPITAL_COMMUNITY): Payer: Self-pay | Admitting: *Deleted

## 2023-03-18 DIAGNOSIS — F5101 Primary insomnia: Secondary | ICD-10-CM

## 2023-03-18 MED ORDER — ZOLPIDEM TARTRATE ER 12.5 MG PO TBCR: 12.5000 mg | EXTENDED_RELEASE_TABLET | Freq: Every evening | ORAL | 1 refills | Status: DC | PRN

## 2023-03-18 NOTE — Telephone Encounter (Signed)
Rx Refill Request --[facility]   zolpidem (AMBIEN CR) 12.5 MG CR tablet  Spoke with Patient to VERIFY this is Correct she would like for her RX to be filled thru [facility]?? Patient stated this is correct   Last appt     02/16/23 Next appt     05/17/23

## 2023-03-18 NOTE — Addendum Note (Signed)
Addended by: Berniece Andreas T on: 03/18/2023 10:01 AM   Modules accepted: Orders

## 2023-03-18 NOTE — Telephone Encounter (Signed)
Send to [facility] with 30 days and additional refill. Control substance cannot be given for 90 days supply.

## 2023-03-23 ENCOUNTER — Encounter: Payer: Self-pay | Admitting: Nurse Practitioner

## 2023-03-23 ENCOUNTER — Ambulatory Visit (INDEPENDENT_AMBULATORY_CARE_PROVIDER_SITE_OTHER): Payer: 59 | Admitting: Nurse Practitioner

## 2023-03-23 VITALS — BP 139/68 | HR 68 | Temp 97.1°F | Ht 66.5 in | Wt 263.2 lb

## 2023-03-23 DIAGNOSIS — J4540 Moderate persistent asthma, uncomplicated: Secondary | ICD-10-CM | POA: Insufficient documentation

## 2023-03-23 DIAGNOSIS — Z1211 Encounter for screening for malignant neoplasm of colon: Secondary | ICD-10-CM

## 2023-03-23 DIAGNOSIS — R350 Frequency of micturition: Secondary | ICD-10-CM

## 2023-03-23 DIAGNOSIS — M545 Low back pain, unspecified: Secondary | ICD-10-CM | POA: Diagnosis not present

## 2023-03-23 DIAGNOSIS — J302 Other seasonal allergic rhinitis: Secondary | ICD-10-CM

## 2023-03-23 DIAGNOSIS — I10 Essential (primary) hypertension: Secondary | ICD-10-CM

## 2023-03-23 DIAGNOSIS — R7303 Prediabetes: Secondary | ICD-10-CM | POA: Diagnosis not present

## 2023-03-23 DIAGNOSIS — Z23 Encounter for immunization: Secondary | ICD-10-CM | POA: Insufficient documentation

## 2023-03-23 DIAGNOSIS — K219 Gastro-esophageal reflux disease without esophagitis: Secondary | ICD-10-CM | POA: Diagnosis not present

## 2023-03-23 DIAGNOSIS — G8929 Other chronic pain: Secondary | ICD-10-CM

## 2023-03-23 DIAGNOSIS — Z1231 Encounter for screening mammogram for malignant neoplasm of breast: Secondary | ICD-10-CM

## 2023-03-23 DIAGNOSIS — F32A Depression, unspecified: Secondary | ICD-10-CM | POA: Insufficient documentation

## 2023-03-23 DIAGNOSIS — F320 Major depressive disorder, single episode, mild: Secondary | ICD-10-CM

## 2023-03-23 MED ORDER — OXYBUTYNIN CHLORIDE ER 10 MG PO TB24: 10.0000 mg | ORAL_TABLET | Freq: Every day | ORAL | 3 refills | Status: DC

## 2023-03-23 MED ORDER — FLUTICASONE PROPIONATE 50 MCG/ACT NA SUSP: NASAL | 3 refills | Status: AC

## 2023-03-23 MED ORDER — CETIRIZINE HCL 10 MG PO TABS: ORAL_TABLET | ORAL | 1 refills | Status: AC

## 2023-03-23 MED ORDER — BUDESONIDE-FORMOTEROL FUMARATE 80-4.5 MCG/ACT IN AERO: 2.0000 | INHALATION_SPRAY | Freq: Two times a day (BID) | RESPIRATORY_TRACT | 3 refills | Status: DC

## 2023-03-23 MED ORDER — ALBUTEROL SULFATE HFA 108 (90 BASE) MCG/ACT IN AERS: 2.0000 | INHALATION_SPRAY | Freq: Four times a day (QID) | RESPIRATORY_TRACT | 0 refills | Status: DC | PRN

## 2023-03-23 MED ORDER — ALBUTEROL SULFATE (2.5 MG/3ML) 0.083% IN NEBU: 2.5000 mg | INHALATION_SOLUTION | Freq: Four times a day (QID) | RESPIRATORY_TRACT | 0 refills | Status: DC | PRN

## 2023-03-23 NOTE — Assessment & Plan Note (Signed)
[place] Office Visit from 03/23/2023 in [place]  PHQ-9 Total Score 8       She is currently established with psychiatrist Takes Zoloft 100 mg daily, Ambien 2.5 mg at bedtime as needed for insomnia, Wellbutrin 300 mg daily She would like referral for counseling Appointment scheduled with Claudie Revering

## 2023-03-23 NOTE — Assessment & Plan Note (Addendum)
Takes Percocet 10 325 mg 1 tablet every 8 hours as needed, tizanidine 4 mg 3 times daily She is followed by pain management specialist

## 2023-03-23 NOTE — Assessment & Plan Note (Signed)
BP Readings from Last 3 Encounters:  03/23/23 139/68  11/02/22 120/78  11/05/21 (!) 152/84  Currently on Lasix 20 mg twice daily, carvedilol 25 mg twice daily, spironolactone 100 mg twice daily losartan 100 mg daily Continue current medications  Discussed DASH diet and dietary sodium restrictions Continue to increase dietary efforts and exercise.  Appreciate collaboration with cardiology and nephrologist. Check CMP

## 2023-03-23 NOTE — Assessment & Plan Note (Signed)
Flonase nasal spray 2 spray into both nostrils daily, cetirizine 10 mg daily refilled

## 2023-03-23 NOTE — Assessment & Plan Note (Signed)
Lab Results  Component Value Date   HGBA1C 5.8 (A) 07/05/2018  Checking A1c today

## 2023-03-23 NOTE — Assessment & Plan Note (Signed)
Patient educated on CDC recommendation for the influenza vaccine. Verbal consent was obtained from the patient, vaccine administered by nurse, no sign of adverse reactions noted at this time. Patient education on arm soreness and use of tylenol for this patient  was discussed. Patient educated on the signs and symptoms of adverse effect and advise to contact the office if they occur.  

## 2023-03-23 NOTE — Assessment & Plan Note (Signed)
Chronic condition Continue famotidine 20 mg twice daily Avoid fatty food fried foods spicy foods

## 2023-03-23 NOTE — Assessment & Plan Note (Signed)
Chronic uncontrolled, currently on albuterol inhaler, albuterol inhaler and nebulizer refilled.  Start [employer]  Inhaler inhaler.  She was encouraged to use albuterol inhaler only as needed.   - albuterol (VENTOLIN HFA) 108 (90 Base) MCG/ACT inhaler; Inhale 2 puffs into the lungs every 6 (six) hours as needed for wheezing or shortness of breath.  Dispense: 18 g; Refill: 0 - budesonide-formoterol (SYMBICORT) 80-4.5 MCG/ACT inhaler; Inhale 2 puffs into the lungs 2 (two) times daily.  Dispense: 1 each; Refill: 3 - albuterol (PROVENTIL) (2.5 MG/3ML) 0.083% nebulizer solution; Take 3 mLs (2.5 mg total) by nebulization every 6 (six) hours as needed for wheezing or shortness of breath.  Dispense: 75 mL; Refill: 0 Follow-up in 3 months

## 2023-03-23 NOTE — Progress Notes (Signed)
New Patient Office Visit  Subjective:  Patient ID: Erin Bradford, female    DOB: 07/13/1971  Age: 52 y.o. MRN: [id number]  CC:  Chief Complaint  Patient presents with   Establish Care    Med prescriptions and disabilities papers fill in.    HPI Erin Bradford is a 52 y.o. female  has a past medical history of Abnormal laboratory test, Acid reflux, Allergy, Anemia, Anxiety, Arthritis, Blood transfusion without reported diagnosis, BMI 39.0-39.9,adult, Chronic hypokalemia, Chronic kidney disease, Depression, GERD (gastroesophageal reflux disease), H/O: hysterectomy, Heart murmur, Hypertension, Insomnia, Low back pain, Obesity, Occasional tremors, Palpitations, PONV (postoperative nausea and vomiting), Sacroiliitis ([place]), Tiredness, and Urinary frequency.  Patient presents to establish care for her chronic medical condition .previous PCP [name], NP.  Last visit was over 2 years ago.   She goes to [facility] for chronic pain management has chronic low back pain, bilateral knee pain, uses a cane for ambulation.   She goes to [facility] every 6 to 12 months for her chronic kidney disease.   Asthma.  She reports that her asthma has been uncontrolled since she had COVID some years ago uses her albuterol inhaler about 4 times weekly.   Due for mammogram mammogram ordered, Cologuard ordered to screen for colon cancer.  Flu vaccine given in the office today patient encouraged to get her shingles vaccine and Tdap vaccine at the pharmacy.   Patient currently denies fever, chills, chest pain, abdominal pain nausea vomiting diarrhea.    Past Medical History:  Diagnosis Date   Abnormal laboratory test    Acid reflux    Allergy    Anemia    Anxiety    Arthritis    Blood transfusion without reported diagnosis    BMI 39.0-39.9,adult    Chronic hypokalemia    Chronic kidney disease    mild   Depression    GERD (gastroesophageal reflux disease)     H/O: hysterectomy    Heart murmur    Hypertension    Insomnia    Low back pain    Obesity    Occasional tremors    Palpitations    dx with tachycardia    PONV (postoperative nausea and vomiting)    Sacroiliitis ([facility])    Tiredness    Urinary frequency     Past Surgical History:  Procedure Laterality Date   CESAREAN SECTION     TOOTH EXTRACTION     TOTAL LAPAROSCOPIC HYSTERECTOMY WITH SALPINGECTOMY Bilateral 10/06/2017   Procedure: ATTEMPTED HYSTERECTOMY TOTAL LAPAROSCOPIC WITH SALPINGECTOMY CONVERTED TO OPEN ABDOMINAL TOTAL HYSTERECTOMY WITH SALPINGECTOMY;  Surgeon: Sherlyn Hay, DO;  Location: [facility];  Service: Gynecology;  Laterality: Bilateral;    Family History  Problem Relation Age of Onset   Diabetes Mother    Hypertension Mother    Hyperlipidemia Mother    Sarcoidosis Mother    Kidney disease Father    Atrial fibrillation Sister    Kidney disease Maternal Grandmother    Pancreatic cancer Maternal Grandmother    Diabetes Maternal Grandmother    Hypertension Maternal Grandmother    Cancer Maternal Grandfather    Colon cancer Neg Hx    Colon polyps Neg Hx    Esophageal cancer Neg Hx    Stomach cancer Neg Hx    Rectal cancer Neg Hx     Social History   Socioeconomic History   Marital status: Widowed    Spouse name: Not on file  Number of children: 3   Years of education: Not on file   Highest education level: Not on file  Occupational History   Not on file  Tobacco Use   Smoking status: Never   Smokeless tobacco: Never  Vaping Use   Vaping Use: Never used  Substance and Sexual Activity   Alcohol use: No   Drug use: No   Sexual activity: Not Currently    Birth control/protection: Condom  Other Topics Concern   Not on file  Social History Narrative   Leave with daugther   2-story apartment   Rt hand   occasion 1- cup coffee      Social Determinants of Health   Financial Resource Strain: Not on file  Food  Insecurity: Not on file  Transportation Needs: Not on file  Physical Activity: Not on file  Stress: Not on file  Social Connections: Not on file  Intimate Partner Violence: Not on file    ROS Review of Systems  Constitutional: Negative.   HENT:  Positive for sinus pressure and sneezing.   Respiratory:  Positive for shortness of breath and wheezing. Negative for apnea, cough and choking.   Cardiovascular:  Positive for palpitations. Negative for chest pain and leg swelling.  Gastrointestinal: Negative.  Negative for abdominal distention, abdominal pain and anal bleeding.  Genitourinary:  Positive for frequency. Negative for dysuria, enuresis, flank pain and hematuria.  Musculoskeletal:  Positive for back pain and gait problem.  Skin: Negative.   Allergic/Immunologic: Positive for environmental allergies.  Neurological:  Negative for dizziness, seizures, facial asymmetry, speech difficulty, light-headedness, numbness and headaches.  Psychiatric/Behavioral:  Negative for agitation, behavioral problems, confusion, self-injury and suicidal ideas.     Objective:   Today's Vitals: BP 139/68   Pulse 68   Temp (!) 97.1 F (36.2 C)   Ht 5' 6.5" (1.689 m)   Wt 263 lb 3.2 oz (119.4 kg)   LMP 09/17/2017 (Approximate)   SpO2 99%   BMI 41.85 kg/m   Physical Exam Constitutional:      General: She is not in acute distress.    Appearance: She is obese. She is not ill-appearing, toxic-appearing or diaphoretic.  HENT:     Right Ear: Tympanic membrane, ear canal and external ear normal. There is no impacted cerumen.     Left Ear: Tympanic membrane, ear canal and external ear normal. There is no impacted cerumen.     Nose: No congestion or rhinorrhea.     Mouth/Throat:     Mouth: Mucous membranes are moist.     Pharynx: No oropharyngeal exudate or posterior oropharyngeal erythema.  Eyes:     General: No scleral icterus.       Right eye: No discharge.        Left eye: No discharge.      Extraocular Movements: Extraocular movements intact.  Cardiovascular:     Rate and Rhythm: Normal rate and regular rhythm.     Pulses: Normal pulses.     Heart sounds: Normal heart sounds. No murmur heard.    No friction rub. No gallop.  Pulmonary:     Effort: Pulmonary effort is normal. No respiratory distress.     Breath sounds: No stridor. No wheezing, rhonchi or rales.  Chest:     Chest wall: No tenderness.  Abdominal:     Palpations: Abdomen is soft.     Tenderness: There is no abdominal tenderness. There is no right CVA tenderness, left CVA tenderness or guarding.  Musculoskeletal:  Right lower leg: No edema.     Left lower leg: No edema.     Comments: Sitting comfortably in a chair, uses a cane for ambulation  Skin:    General: Skin is warm and dry.     Capillary Refill: Capillary refill takes less than 2 seconds.     Coloration: Skin is not jaundiced.     Findings: No bruising or erythema.  Neurological:     Mental Status: She is alert and oriented to person, place, and time.     Motor: No weakness.     Coordination: Coordination normal.  Psychiatric:        Mood and Affect: Mood normal.        Behavior: Behavior normal.        Thought Content: Thought content normal.     Assessment & Plan:   Problem List Items Addressed This Visit       Cardiovascular and Mediastinum   Hypertensive disorder    BP Readings from Last 3 Encounters:  03/23/23 139/68  11/02/22 120/78  11/05/21 (!) 152/84  Currently on Lasix 20 mg twice daily, carvedilol 25 mg twice daily, spironolactone 100 mg twice daily losartan 100 mg daily Continue current medications  Discussed DASH diet and dietary sodium restrictions Continue to increase dietary efforts and exercise.  Appreciate collaboration with cardiology and nephrologist. Check CMP          Respiratory   Uncontrolled moderate persistent asthma    Chronic uncontrolled, currently on albuterol inhaler, albuterol inhaler and  nebulizer refilled.  Start [employer]  Inhaler inhaler.  She was encouraged to use albuterol inhaler only as needed.   - albuterol (VENTOLIN HFA) 108 (90 Base) MCG/ACT inhaler; Inhale 2 puffs into the lungs every 6 (six) hours as needed for wheezing or shortness of breath.  Dispense: 18 g; Refill: 0 - budesonide-formoterol (SYMBICORT) 80-4.5 MCG/ACT inhaler; Inhale 2 puffs into the lungs 2 (two) times daily.  Dispense: 1 each; Refill: 3 - albuterol (PROVENTIL) (2.5 MG/3ML) 0.083% nebulizer solution; Take 3 mLs (2.5 mg total) by nebulization every 6 (six) hours as needed for wheezing or shortness of breath.  Dispense: 75 mL; Refill: 0 Follow-up in 3 months       Relevant Medications   albuterol (VENTOLIN HFA) 108 (90 Base) MCG/ACT inhaler   budesonide-formoterol (SYMBICORT) 80-4.5 MCG/ACT inhaler   albuterol (PROVENTIL) (2.5 MG/3ML) 0.083% nebulizer solution     Digestive   Gastroesophageal reflux disease without esophagitis - Primary    Chronic condition Continue famotidine 20 mg twice daily Avoid fatty food fried foods spicy foods      Relevant Orders   CBC with Differential   CMP14+EGFR     Other   Seasonal allergies    Flonase nasal spray 2 spray into both nostrils daily, cetirizine 10 mg daily refilled      Relevant Medications   cetirizine (ZYRTEC) 10 MG tablet   fluticasone (FLONASE) 50 MCG/ACT nasal spray   Prediabetes    Lab Results  Component Value Date   HGBA1C 5.8 (A) 07/05/2018  Checking A1c today      Relevant Orders   Hemoglobin A1c   Chronic low back pain    Takes Percocet 10 325 mg 1 tablet every 8 hours as needed, tizanidine 4 mg 3 times daily She is followed by pain management specialist      Urinary frequency    Chronic condition Oxybutynin 10 mg daily at bedtime refilled Checking A1c to rule out diabetes  Relevant Medications   oxybutynin (DITROPAN XL) 10 MG 24 hr tablet   Need for influenza vaccination    Patient educated on CDC  recommendation for the influenza vaccine. Verbal consent was obtained from the patient, vaccine administered by nurse, no sign of adverse reactions noted at this time. Patient education on arm soreness and use of tylenol  for this patient  was discussed. Patient educated on the signs and symptoms of adverse effect and advise to contact the office if they occur.       Relevant Orders   Flu Vaccine QUAD 29mo+IM (Fluarix, Fluzone & Alfiuria Quad PF) (Completed)   Depression    Flowsheet Row Office Visit from 03/23/2023 in [place]  PHQ-9 Total Score 8       She is currently established with psychiatrist Takes Zoloft 100 mg daily, Ambien 2.5 mg at bedtime as needed for insomnia, Wellbutrin 300 mg daily She would like referral for counseling Appointment scheduled with Claudie Revering      Other Visit Diagnoses     Screening for colon cancer       Relevant Orders   Cologuard   Encounter for screening mammogram for malignant neoplasm of breast       Relevant Orders   MM Digital Screening       Outpatient Encounter Medications as of 03/23/2023  Medication Sig   AMITIZA 24 MCG capsule    budesonide-formoterol (SYMBICORT) 80-4.5 MCG/ACT inhaler Inhale 2 puffs into the lungs 2 (two) times daily.   buPROPion (WELLBUTRIN XL) 300 MG 24 hr tablet Take 1 tablet (300 mg total) by mouth daily.   carvedilol (COREG) 25 MG tablet Take 1 tablet (25 mg total) by mouth 2 (two) times daily.   famotidine (PEPCID) 20 MG tablet TAKE 1 TABLET(20 MG) BY MOUTH TWICE DAILY   furosemide (LASIX) 20 MG tablet Take 20 mg by mouth 2 (two) times daily.    gabapentin (NEURONTIN) 300 MG capsule Take 2 capsules by mouth 3 (three) times daily.   losartan (COZAAR) 100 MG tablet Take 1 tablet (100 mg total) by mouth daily.   NARCAN 4 MG/0.1ML LIQD nasal spray kit Place 1 spray into the nose as directed.   oxyCODONE-acetaminophen (PERCOCET) 10-325 MG tablet Take 1 tablet by mouth every 8 (eight) hours as  needed for pain.   primidone (MYSOLINE) 50 MG tablet TAKE 1 TABLET BY MOUTH EVERY MORNING AND 6 TABLETS BY MOUTH EVERY EVENING   sertraline (ZOLOFT) 100 MG tablet Take 1 tablet (100 mg total) by mouth daily.   spironolactone (ALDACTONE) 100 MG tablet Take 100 mg by mouth 2 (two) times daily.   tiZANidine (ZANAFLEX) 4 MG tablet Take 4 mg by mouth 3 (three) times daily.   zolpidem (AMBIEN CR) 12.5 MG CR tablet Take 1 tablet (12.5 mg total) by mouth at bedtime as needed for sleep.   [DISCONTINUED] albuterol (PROVENTIL) (2.5 MG/3ML) 0.083% nebulizer solution Take 3 mLs (2.5 mg total) by nebulization every 6 (six) hours as needed for wheezing or shortness of breath.   [DISCONTINUED] albuterol (VENTOLIN HFA) 108 (90 Base) MCG/ACT inhaler Inhale 2 puffs into the lungs every 6 (six) hours as needed for wheezing or shortness of breath.   [DISCONTINUED] cetirizine (ZYRTEC) 10 MG tablet TAKE 1 TABLET(10 MG) BY MOUTH AT BEDTIME AS NEEDED FOR ALLERGIES   [DISCONTINUED] fluticasone (FLONASE) 50 MCG/ACT nasal spray SHAKE LIQUID AND USE 2 SPRAYS IN EACH NOSTRIL DAILY   albuterol (PROVENTIL) (2.5 MG/3ML) 0.083% nebulizer solution  Take 3 mLs (2.5 mg total) by nebulization every 6 (six) hours as needed for wheezing or shortness of breath.   albuterol (VENTOLIN HFA) 108 (90 Base) MCG/ACT inhaler Inhale 2 puffs into the lungs every 6 (six) hours as needed for wheezing or shortness of breath.   cetirizine (ZYRTEC) 10 MG tablet TAKE 1 TABLET(10 MG) BY MOUTH AT BEDTIME AS NEEDED FOR ALLERGIES   fluticasone (FLONASE) 50 MCG/ACT nasal spray SHAKE LIQUID AND USE 2 SPRAYS IN EACH NOSTRIL DAILY   Glucose Blood (BLOOD GLUCOSE TEST STRIPS) STRP  (Patient not taking: Reported on 03/23/2023)   LOMAIRA 8 MG TABS Take 1 tablet by mouth 2 (two) times daily. (Patient not taking: Reported on 03/23/2023)   oxybutynin (DITROPAN XL) 10 MG 24 hr tablet Take 1 tablet (10 mg total) by mouth at bedtime.   No facility-administered encounter  medications on file as of 03/23/2023.    Follow-up: Return in about 3 months (around 06/23/2023) for CPE.   Renee Rival, FNP

## 2023-03-23 NOTE — Assessment & Plan Note (Signed)
Chronic condition Oxybutynin 10 mg daily at bedtime refilled Checking A1c to rule out diabetes

## 2023-03-23 NOTE — Patient Instructions (Addendum)
Please get your shingles vaccine , TDAP vaccine at your pharmacy.   Prediabetes  - Hemoglobin A1c   Urinary frequency  - oxybutynin (DITROPAN XL) 10 MG 24 hr tablet; Take 1 tablet (10 mg total) by mouth at bedtime.  Dispense: 30 tablet; Refill: 3   Moderate persistent asthma with status asthmaticus  - albuterol (VENTOLIN HFA) 108 (90 Base) MCG/ACT inhaler; Inhale 2 puffs into the lungs every 6 (six) hours as needed for wheezing or shortness of breath.  Dispense: 18 g; Refill: 0 - budesonide-formoterol (SYMBICORT) 80-4.5 MCG/ACT inhaler; Inhale 2 puffs into the lungs 2 (two) times daily.  Dispense: 1 each; Refill: 3 - albuterol (PROVENTIL) (2.5 MG/3ML) 0.083% nebulizer solution; Take 3 mLs (2.5 mg total) by nebulization every 6 (six) hours as needed for wheezing or shortness of breath.  Dispense: 75 mL; Refill: 0  Seasonal allergies  - cetirizine (ZYRTEC) 10 MG tablet; TAKE 1 TABLET(10 MG) BY MOUTH AT BEDTIME AS NEEDED FOR ALLERGIES  Dispense: 90 tablet; Refill: 1 - fluticasone (FLONASE) 50 MCG/ACT nasal spray; SHAKE LIQUID AND USE 2 SPRAYS IN EACH NOSTRIL DAILY  Dispense: 48 g; Refill: 3   Screening for colon cancer  - Cologuard  Encounter for screening mammogram for malignant neoplasm of breast  - MM Digital Screening; Future  PLease call [phone]   It is important that you exercise regularly at least 30 minutes 5 times a week as tolerated  Think about what you will eat, plan ahead. Choose " clean, green, fresh or frozen" over canned, processed or packaged foods which are more sugary, salty and fatty. 70 to 75% of food eaten should be vegetables and fruit. Three meals at set times with snacks allowed between meals, but they must be fruit or vegetables. Aim to eat over a 12 hour period , example 7 am to 7 pm, and STOP after  your last meal of the day. Drink water,generally about 64 ounces per day, no other drink is as healthy. Fruit juice is best enjoyed in a healthy way, by  EATING the fruit.  Thanks for choosing [facility] we consider it a privelige to serve you.

## 2023-03-24 LAB — CMP14+EGFR
ALT: 17 IU/L (ref 0–32)
AST: 17 IU/L (ref 0–40)
Albumin/Globulin Ratio: 1.9 (ref 1.2–2.2)
Albumin: 4.4 g/dL (ref 3.8–4.9)
Alkaline Phosphatase: 160 IU/L — ABNORMAL HIGH (ref 44–121)
BUN/Creatinine Ratio: 6 — ABNORMAL LOW (ref 9–23)
BUN: 5 mg/dL — ABNORMAL LOW (ref 6–24)
Bilirubin Total: <0.2 mg/dL (ref 0.0–1.2)
CO2: 22 mmol/L (ref 20–29)
Calcium: 9.7 mg/dL (ref 8.7–10.2)
Chloride: 100 mmol/L (ref 96–106)
Creatinine, Ser: 0.85 mg/dL (ref 0.57–1.00)
Globulin, Total: 2.3 g/dL (ref 1.5–4.5)
Glucose: 63 mg/dL — ABNORMAL LOW (ref 70–99)
Potassium: 3.9 mmol/L (ref 3.5–5.2)
Sodium: 139 mmol/L (ref 134–144)
Total Protein: 6.7 g/dL (ref 6.0–8.5)
eGFR: 82 mL/min/{1.73_m2} (ref >59)

## 2023-03-24 LAB — CBC WITH DIFFERENTIAL/PLATELET
Basophils Absolute: 0.1 10*3/uL (ref 0.0–0.2)
Basos: 1 %
EOS (ABSOLUTE): 0.4 10*3/uL (ref 0.0–0.4)
Eos: 4 %
Hematocrit: 38.8 % (ref 34.0–46.6)
Hemoglobin: 12.9 g/dL (ref 11.1–15.9)
Immature Grans (Abs): 0 10*3/uL (ref 0.0–0.1)
Immature Granulocytes: 0 %
Lymphocytes Absolute: 3.7 10*3/uL — ABNORMAL HIGH (ref 0.7–3.1)
Lymphs: 39 %
MCH: 29.4 pg (ref 26.6–33.0)
MCHC: 33.2 g/dL (ref 31.5–35.7)
MCV: 88 fL (ref 79–97)
Monocytes Absolute: 1.1 10*3/uL — ABNORMAL HIGH (ref 0.1–0.9)
Monocytes: 11 %
Neutrophils Absolute: 4.2 10*3/uL (ref 1.4–7.0)
Neutrophils: 45 %
Platelets: 439 10*3/uL (ref 150–450)
RBC: 4.39 x10E6/uL (ref 3.77–5.28)
RDW: 12.6 % (ref 11.7–15.4)
WBC: 9.5 10*3/uL (ref 3.4–10.8)

## 2023-03-24 LAB — HEMOGLOBIN A1C
Est. average glucose Bld gHb Est-mCnc: 131 mg/dL
Hgb A1c MFr Bld: 6.2 % — ABNORMAL HIGH (ref 4.8–5.6)

## 2023-03-28 ENCOUNTER — Telehealth: Payer: Self-pay

## 2023-03-28 NOTE — Telephone Encounter (Signed)
Left a message

## 2023-03-28 NOTE — Telephone Encounter (Signed)
Letter  

## 2023-04-21 ENCOUNTER — Encounter: Payer: Self-pay | Admitting: Oncology

## 2023-04-28 ENCOUNTER — Telehealth (HOSPITAL_COMMUNITY): Payer: Self-pay

## 2023-04-28 ENCOUNTER — Telehealth (HOSPITAL_COMMUNITY): Payer: Self-pay | Admitting: *Deleted

## 2023-04-28 ENCOUNTER — Other Ambulatory Visit: Payer: Self-pay

## 2023-04-28 DIAGNOSIS — J4540 Moderate persistent asthma, uncomplicated: Secondary | ICD-10-CM

## 2023-04-28 NOTE — Telephone Encounter (Signed)
Patient has called very upset about her medication refills-- She states she  's out of medication & that her Appt isn't until 05/17/23.  Patient asking for a medication bridge on her  buPROPion (WELLBUTRIN XL) 300 MG 24 hr tablet                        &&  zolpidem (AMBIEN CR) 12.5 MG CR tablet  Also Requested to send to Rx  [id number] - [place], [place] - [address] AT [facility] OF [address] & [facility] [address]

## 2023-04-28 NOTE — Telephone Encounter (Signed)
Please advise KH 

## 2023-04-28 NOTE — Telephone Encounter (Signed)
Medication management - Telephone call with patient to follow up on a concern she relayed to our patient access staff. Discussed patient's status with refills needed for her Ambien CR 12.5 mg and Wellbutrin XL 300 mg. Informed patient after speaking with pharmacist at her [facility], patient still has refills of both and they are filling those for her currently.  Took patient's shared information and concerns as patient was thankful for call back and for making sure she still had refills to last until she returns to see Dr. Lolly Mustache 05/17/23.

## 2023-04-29 ENCOUNTER — Other Ambulatory Visit: Payer: Self-pay | Admitting: Nurse Practitioner

## 2023-04-29 DIAGNOSIS — J4540 Moderate persistent asthma, uncomplicated: Secondary | ICD-10-CM

## 2023-04-29 MED ORDER — ALBUTEROL SULFATE HFA 108 (90 BASE) MCG/ACT IN AERS
2.0000 | INHALATION_SPRAY | Freq: Four times a day (QID) | RESPIRATORY_TRACT | 2 refills | Status: DC | PRN
Start: 2023-04-29 — End: 2023-08-08

## 2023-04-30 ENCOUNTER — Other Ambulatory Visit: Payer: Self-pay | Admitting: Nurse Practitioner

## 2023-04-30 DIAGNOSIS — J4540 Moderate persistent asthma, uncomplicated: Secondary | ICD-10-CM

## 2023-05-02 NOTE — Telephone Encounter (Signed)
Please advise KH 

## 2023-05-03 ENCOUNTER — Ambulatory Visit (INDEPENDENT_AMBULATORY_CARE_PROVIDER_SITE_OTHER): Payer: 59 | Admitting: Nurse Practitioner

## 2023-05-03 ENCOUNTER — Encounter: Payer: Self-pay | Admitting: Oncology

## 2023-05-03 DIAGNOSIS — R42 Dizziness and giddiness: Secondary | ICD-10-CM

## 2023-05-03 DIAGNOSIS — G8929 Other chronic pain: Secondary | ICD-10-CM

## 2023-05-03 DIAGNOSIS — H579 Unspecified disorder of eye and adnexa: Secondary | ICD-10-CM | POA: Insufficient documentation

## 2023-05-03 DIAGNOSIS — I10 Essential (primary) hypertension: Secondary | ICD-10-CM | POA: Diagnosis not present

## 2023-05-03 DIAGNOSIS — M545 Low back pain, unspecified: Secondary | ICD-10-CM
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
## 2023-05-03 NOTE — Assessment & Plan Note (Signed)
BP Readings from Last 3 Encounters:  05/03/23 101/60  03/23/23 139/68  11/02/22 120/78  She is currently on furosemide 20 mg twice daily, carvedilol 25 mg twice daily, losartan 100 mg daily, spironolactone 100 mg twice daily, She has taking all medications today HTN Controlled . Continue current medications. No changes in management. Discussed DASH diet and dietary sodium restrictions Continue to increase dietary efforts and exercise.

## 2023-05-03 NOTE — Assessment & Plan Note (Signed)
Takes Percocet 10 325 mg 1 tablet every 8 hours as needed, tizanidine 4 mg 3 times daily She is followed by pain management specialist 

## 2023-05-03 NOTE — Patient Instructions (Signed)
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
## 2023-05-05 ENCOUNTER — Telehealth: Payer: Self-pay

## 2023-05-05 NOTE — Telephone Encounter (Signed)
Please advise KH 

## 2023-05-10 ENCOUNTER — Telehealth: Payer: Self-pay

## 2023-05-10 NOTE — Telephone Encounter (Signed)
Note was printed and will be mailed. Kh

## 2023-05-10 NOTE — Telephone Encounter (Signed)
Pt would like to speak to you about her visit 05/03/23. Sh has requested a copy of her office note to be mailed to her . Pt is also requesting a call back form you to discuss her vist. Please call pt . Thank you [facility]

## 2023-05-11 ENCOUNTER — Other Ambulatory Visit (HOSPITAL_COMMUNITY): Payer: Self-pay | Admitting: Psychiatry

## 2023-05-11 ENCOUNTER — Encounter: Payer: Self-pay | Admitting: Oncology

## 2023-05-11 DIAGNOSIS — F411 Generalized anxiety disorder: Secondary | ICD-10-CM

## 2023-05-11 DIAGNOSIS — F330 Major depressive disorder, recurrent, mild: Secondary | ICD-10-CM

## 2023-05-17 ENCOUNTER — Encounter (HOSPITAL_COMMUNITY): Payer: Self-pay | Admitting: Psychiatry

## 2023-05-17 ENCOUNTER — Telehealth (HOSPITAL_BASED_OUTPATIENT_CLINIC_OR_DEPARTMENT_OTHER): Payer: 59 | Admitting: Psychiatry

## 2023-05-17 ENCOUNTER — Other Ambulatory Visit: Payer: Self-pay | Admitting: Nurse Practitioner

## 2023-05-17 VITALS — Wt 264.0 lb

## 2023-05-17 DIAGNOSIS — R350 Frequency of micturition: Secondary | ICD-10-CM

## 2023-05-17 DIAGNOSIS — F430 Acute stress reaction: Secondary | ICD-10-CM | POA: Diagnosis not present

## 2023-05-17 DIAGNOSIS — F411 Generalized anxiety disorder: Secondary | ICD-10-CM

## 2023-05-17 DIAGNOSIS — F5101 Primary insomnia: Secondary | ICD-10-CM

## 2023-05-17 DIAGNOSIS — F330 Major depressive disorder, recurrent, mild: Secondary | ICD-10-CM

## 2023-05-17 MED ORDER — ZOLPIDEM TARTRATE ER 12.5 MG PO TBCR
12.5000 mg | EXTENDED_RELEASE_TABLET | Freq: Every evening | ORAL | 1 refills | Status: DC | PRN
Start: 2023-05-17 — End: 2023-07-05

## 2023-05-17 MED ORDER — BUPROPION HCL ER (XL) 300 MG PO TB24: 300.0000 mg | ORAL_TABLET | Freq: Every day | ORAL | 0 refills | Status: DC

## 2023-05-17 MED ORDER — SERTRALINE HCL 100 MG PO TABS
100.0000 mg | ORAL_TABLET | Freq: Every day | ORAL | 0 refills | Status: DC
Start: 2023-05-17 — End: 2023-07-05
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
## 2023-05-18 ENCOUNTER — Telehealth (HOSPITAL_COMMUNITY): Payer: Self-pay

## 2023-05-18 NOTE — Telephone Encounter (Signed)
Yes, please draft a letter and I will sign tomorrow when I am coming to the office.
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
## 2023-05-18 NOTE — Telephone Encounter (Signed)
Please advise if you want her to continue on this medication.

## 2023-05-19 ENCOUNTER — Encounter (HOSPITAL_COMMUNITY): Payer: Self-pay

## 2023-05-27 ENCOUNTER — Telehealth (HOSPITAL_COMMUNITY): Payer: 59 | Admitting: Psychiatry

## 2023-06-02 ENCOUNTER — Other Ambulatory Visit: Payer: Self-pay | Admitting: Nurse Practitioner

## 2023-06-02 DIAGNOSIS — J4540 Moderate persistent asthma, uncomplicated: Secondary | ICD-10-CM

## 2023-06-03 NOTE — Telephone Encounter (Signed)
Please advise KH 

## 2023-06-06 ENCOUNTER — Telehealth (HOSPITAL_COMMUNITY): Payer: Self-pay | Admitting: *Deleted

## 2023-06-06 NOTE — Telephone Encounter (Signed)
Ok. Thanks!

## 2023-06-06 NOTE — Telephone Encounter (Signed)
We received paperwork from [facility] [place] including a Verification Form for Psychiatric Disorders. This to document that pt actually has a disability and any accommodations needed. I will complete what I can and then fax form to you.

## 2023-06-06 NOTE — Telephone Encounter (Signed)
Please put in my box and I will review this Thursday.  Thank you

## 2023-06-10 ENCOUNTER — Encounter: Payer: Self-pay | Admitting: Oncology

## 2023-06-17 ENCOUNTER — Encounter (HOSPITAL_COMMUNITY): Payer: Self-pay

## 2023-06-17 ENCOUNTER — Ambulatory Visit (INDEPENDENT_AMBULATORY_CARE_PROVIDER_SITE_OTHER): Payer: 59 | Admitting: Clinical

## 2023-06-17 ENCOUNTER — Encounter (HOSPITAL_COMMUNITY): Payer: Self-pay | Admitting: Clinical

## 2023-06-17 DIAGNOSIS — F332 Major depressive disorder, recurrent severe without psychotic features: Secondary | ICD-10-CM

## 2023-06-17 DIAGNOSIS — F411 Generalized anxiety disorder: Secondary | ICD-10-CM
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
## 2023-06-24 ENCOUNTER — Ambulatory Visit: Payer: Self-pay | Admitting: Nurse Practitioner

## 2023-06-26 ENCOUNTER — Other Ambulatory Visit: Payer: Self-pay | Admitting: Neurology

## 2023-06-27 ENCOUNTER — Telehealth (HOSPITAL_COMMUNITY): Payer: Self-pay | Admitting: Psychiatry

## 2023-06-27 NOTE — Telephone Encounter (Signed)
D:  Ambrose Mantle, LCSW referred pt to virtual MH-IOP.  A:  Oriented pt.  Pt voiced questions about the length of stay and hours.  Pt declined stating she would prefer to continue with individual counseling.  Inform Mareida.  Encouraged pt to call the MH-IOP case mgr if she changes her mind.  R:  Pt receptive.

## 2023-07-05 ENCOUNTER — Encounter (HOSPITAL_COMMUNITY): Payer: Self-pay

## 2023-07-05 ENCOUNTER — Encounter (HOSPITAL_COMMUNITY): Payer: Self-pay | Admitting: Psychiatry

## 2023-07-05 ENCOUNTER — Telehealth (HOSPITAL_BASED_OUTPATIENT_CLINIC_OR_DEPARTMENT_OTHER): Payer: 59 | Admitting: Psychiatry

## 2023-07-05 VITALS — Wt 264.0 lb

## 2023-07-05 DIAGNOSIS — F5101 Primary insomnia: Secondary | ICD-10-CM | POA: Diagnosis not present

## 2023-07-05 DIAGNOSIS — F411 Generalized anxiety disorder: Secondary | ICD-10-CM | POA: Diagnosis not present

## 2023-07-05 DIAGNOSIS — F330 Major depressive disorder, recurrent, mild: Secondary | ICD-10-CM

## 2023-07-05 MED ORDER — SERTRALINE HCL 100 MG PO TABS
100.0000 mg | ORAL_TABLET | Freq: Every day | ORAL | 0 refills | Status: DC
Start: 2023-07-05 — End: 2023-09-05

## 2023-07-05 MED ORDER — BUPROPION HCL ER (XL) 300 MG PO TB24: 300.0000 mg | ORAL_TABLET | Freq: Every day | ORAL | 0 refills | Status: DC

## 2023-07-05 MED ORDER — ZOLPIDEM TARTRATE ER 12.5 MG PO TBCR
12.5000 mg | EXTENDED_RELEASE_TABLET | Freq: Every evening | ORAL | 1 refills | Status: DC | PRN
Start: 2023-07-05 — End: 2023-09-05
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
## 2023-07-25 ENCOUNTER — Encounter (HOSPITAL_COMMUNITY): Payer: Self-pay | Admitting: Clinical

## 2023-07-25 ENCOUNTER — Ambulatory Visit (INDEPENDENT_AMBULATORY_CARE_PROVIDER_SITE_OTHER): Payer: 59 | Admitting: Clinical

## 2023-07-25 DIAGNOSIS — F4310 Post-traumatic stress disorder, unspecified: Secondary | ICD-10-CM | POA: Diagnosis not present

## 2023-07-25 DIAGNOSIS — F332 Major depressive disorder, recurrent severe without psychotic features: Secondary | ICD-10-CM

## 2023-07-25 DIAGNOSIS — F411 Generalized anxiety disorder: Secondary | ICD-10-CM | POA: Diagnosis not present

## 2023-07-25 DIAGNOSIS — F4000 Agoraphobia, unspecified: Secondary | ICD-10-CM | POA: Diagnosis not present
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
## 2023-08-07 ENCOUNTER — Other Ambulatory Visit: Payer: Self-pay | Admitting: Student

## 2023-08-08 ENCOUNTER — Other Ambulatory Visit: Payer: Self-pay | Admitting: Nurse Practitioner

## 2023-08-08 ENCOUNTER — Ambulatory Visit (INDEPENDENT_AMBULATORY_CARE_PROVIDER_SITE_OTHER): Payer: 59 | Admitting: Clinical

## 2023-08-08 ENCOUNTER — Encounter (HOSPITAL_COMMUNITY): Payer: Self-pay | Admitting: Clinical

## 2023-08-08 DIAGNOSIS — F4310 Post-traumatic stress disorder, unspecified: Secondary | ICD-10-CM

## 2023-08-08 DIAGNOSIS — F332 Major depressive disorder, recurrent severe without psychotic features: Secondary | ICD-10-CM | POA: Diagnosis not present

## 2023-08-08 DIAGNOSIS — F4000 Agoraphobia, unspecified: Secondary | ICD-10-CM | POA: Diagnosis not present

## 2023-08-08 DIAGNOSIS — F411 Generalized anxiety disorder: Secondary | ICD-10-CM

## 2023-08-08 DIAGNOSIS — J4540 Moderate persistent asthma, uncomplicated: Secondary | ICD-10-CM
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
## 2023-08-08 NOTE — Telephone Encounter (Signed)
Please advise Kh 

## 2023-09-05 ENCOUNTER — Telehealth (HOSPITAL_BASED_OUTPATIENT_CLINIC_OR_DEPARTMENT_OTHER): Payer: 59 | Admitting: Psychiatry

## 2023-09-05 ENCOUNTER — Encounter (HOSPITAL_COMMUNITY): Payer: Self-pay | Admitting: Psychiatry

## 2023-09-05 VITALS — Wt 272.0 lb

## 2023-09-05 DIAGNOSIS — F330 Major depressive disorder, recurrent, mild: Secondary | ICD-10-CM

## 2023-09-05 DIAGNOSIS — F5101 Primary insomnia: Secondary | ICD-10-CM | POA: Diagnosis not present

## 2023-09-05 DIAGNOSIS — F411 Generalized anxiety disorder: Secondary | ICD-10-CM | POA: Diagnosis not present

## 2023-09-05 MED ORDER — ZOLPIDEM TARTRATE ER 12.5 MG PO TBCR
12.5000 mg | EXTENDED_RELEASE_TABLET | Freq: Every evening | ORAL | 1 refills | Status: DC | PRN
Start: 2023-09-05 — End: 2023-12-05

## 2023-09-05 MED ORDER — BUPROPION HCL ER (XL) 300 MG PO TB24: 300.0000 mg | ORAL_TABLET | Freq: Every day | ORAL | 0 refills | Status: DC

## 2023-09-05 MED ORDER — SERTRALINE HCL 100 MG PO TABS
100.0000 mg | ORAL_TABLET | Freq: Every day | ORAL | 0 refills | Status: DC
Start: 2023-09-05 — End: 2023-12-05
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
## 2023-09-06 ENCOUNTER — Encounter: Payer: Self-pay | Admitting: Oncology

## 2023-09-08 ENCOUNTER — Ambulatory Visit: Payer: 59

## 2023-09-21 ENCOUNTER — Encounter: Payer: Self-pay | Admitting: Oncology

## 2023-09-30 ENCOUNTER — Ambulatory Visit (INDEPENDENT_AMBULATORY_CARE_PROVIDER_SITE_OTHER): Payer: 59 | Admitting: Clinical

## 2023-09-30 ENCOUNTER — Encounter (HOSPITAL_COMMUNITY): Payer: Self-pay | Admitting: Clinical

## 2023-09-30 ENCOUNTER — Other Ambulatory Visit: Payer: Self-pay | Admitting: Nurse Practitioner

## 2023-09-30 DIAGNOSIS — F330 Major depressive disorder, recurrent, mild: Secondary | ICD-10-CM | POA: Diagnosis not present

## 2023-09-30 DIAGNOSIS — F4310 Post-traumatic stress disorder, unspecified: Secondary | ICD-10-CM | POA: Diagnosis not present

## 2023-09-30 DIAGNOSIS — F411 Generalized anxiety disorder: Secondary | ICD-10-CM

## 2023-09-30 DIAGNOSIS — Z1211 Encounter for screening for malignant neoplasm of colon: Secondary | ICD-10-CM

## 2023-09-30 DIAGNOSIS — Z1212 Encounter for screening for malignant neoplasm of rectum: Secondary | ICD-10-CM
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
## 2023-10-10 ENCOUNTER — Other Ambulatory Visit: Payer: Self-pay

## 2023-10-10 MED ORDER — PRIMIDONE 50 MG PO TABS: ORAL_TABLET | ORAL | 0 refills | Status: DC

## 2023-10-10 NOTE — Progress Notes (Signed)
Refill needed for Primidone

## 2023-10-13 ENCOUNTER — Ambulatory Visit (INDEPENDENT_AMBULATORY_CARE_PROVIDER_SITE_OTHER): Payer: 59 | Admitting: Clinical

## 2023-10-13 DIAGNOSIS — F330 Major depressive disorder, recurrent, mild: Secondary | ICD-10-CM

## 2023-10-13 DIAGNOSIS — F4310 Post-traumatic stress disorder, unspecified: Secondary | ICD-10-CM | POA: Diagnosis not present

## 2023-10-13 DIAGNOSIS — F411 Generalized anxiety disorder: Secondary | ICD-10-CM
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
## 2023-10-14 ENCOUNTER — Encounter (HOSPITAL_COMMUNITY): Payer: Self-pay | Admitting: Clinical

## 2023-10-14 NOTE — Progress Notes (Addendum)
   10/14/23 1455  Visit Method  Type of Visit Not In-person   This was entered by mistake.  The date of encounter was 10/13/23 at 11:00am.  Ambrose Mantle, LCSW 10/14/2023, 3:52 PM

## 2023-10-27 ENCOUNTER — Encounter (HOSPITAL_COMMUNITY): Payer: Self-pay | Admitting: Clinical

## 2023-10-27 ENCOUNTER — Ambulatory Visit (HOSPITAL_COMMUNITY): Payer: 59 | Admitting: Clinical

## 2023-10-27 DIAGNOSIS — F411 Generalized anxiety disorder: Secondary | ICD-10-CM | POA: Diagnosis not present

## 2023-10-27 DIAGNOSIS — F4310 Post-traumatic stress disorder, unspecified: Secondary | ICD-10-CM

## 2023-10-27 DIAGNOSIS — F330 Major depressive disorder, recurrent, mild: Secondary | ICD-10-CM | POA: Diagnosis not present
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
## 2023-10-30 ENCOUNTER — Other Ambulatory Visit (HOSPITAL_COMMUNITY): Payer: Self-pay | Admitting: Psychiatry

## 2023-10-30 DIAGNOSIS — F411 Generalized anxiety disorder: Secondary | ICD-10-CM

## 2023-10-30 DIAGNOSIS — F330 Major depressive disorder, recurrent, mild: Secondary | ICD-10-CM

## 2023-11-06 ENCOUNTER — Other Ambulatory Visit (HOSPITAL_COMMUNITY): Payer: Self-pay | Admitting: Psychiatry

## 2023-11-06 DIAGNOSIS — F330 Major depressive disorder, recurrent, mild: Secondary | ICD-10-CM

## 2023-11-08 ENCOUNTER — Ambulatory Visit: Payer: 59 | Admitting: Family Medicine

## 2023-11-10 ENCOUNTER — Other Ambulatory Visit: Payer: Self-pay | Admitting: Cardiology

## 2023-11-10 ENCOUNTER — Ambulatory Visit (HOSPITAL_COMMUNITY): Payer: 59 | Admitting: Clinical

## 2023-11-10 ENCOUNTER — Encounter (HOSPITAL_COMMUNITY): Payer: Self-pay | Admitting: Clinical

## 2023-11-10 DIAGNOSIS — F411 Generalized anxiety disorder: Secondary | ICD-10-CM

## 2023-11-10 DIAGNOSIS — F4310 Post-traumatic stress disorder, unspecified: Secondary | ICD-10-CM

## 2023-11-10 DIAGNOSIS — F330 Major depressive disorder, recurrent, mild: Secondary | ICD-10-CM | POA: Diagnosis not present

## 2023-11-10 NOTE — Progress Notes (Signed)
THERAPIST PROGRESS NOTE  Session Time: 2:03-3:01pm  Session #7  Virtual Visit via Video Note  I connected with Erin Bradford on 11/10/23 at  2:00 PM EST by a video enabled telemedicine application and verified that I am speaking with the correct person using two identifiers.  Location: Patient: home Provider: [facility]   I discussed the limitations of evaluation and management by telemedicine and the availability of in person appointments. The patient expressed understanding and agreed to proceed.   I discussed the assessment and treatment plan with the patient. The patient was provided an opportunity to ask questions and all were answered. The patient agreed with the plan and demonstrated an understanding of the instructions.   The patient was advised to call back or seek an in-person evaluation if the symptoms worsen or if the condition fails to improve as anticipated.  I provided 58 minutes of non-face-to-face time during this encounter.  Erin Chad, LCSW   Participation Level: Active  Behavioral Response: Casual Lethargic Depressed   Type of Therapy: Individual Therapy  Treatment Goals addressed:   Goal: STG: Erin Bradford will attend at least 80% of scheduled IOP sessions  (Resolved)  Goal: STG: Erin Bradford will participate in at least 80% of scheduled individual psychotherapy sessions   Goal: STG: Erin Bradford will complete at least 80% of assigned homework   Goal: LTG: Erin Bradford will score less than 5 on the Generalized Anxiety Disorder 7 Scale (GAD-7)   Goal: STG: Erin Bradford will practice problem solving skills 3 times per week for the next 4 weeks.   Goal: STG: Erin Bradford will reduce frequency of avoidant behaviors by 50% as evidenced by self-report in therapy sessions  Goal: LTG: Increase coping skills to manage depression and improve ability to perform daily activities  Goal: LTG: Erin Bradford will score less than 9 on the Patient Health  Questionnaire (PHQ-9)   Goal: STG: Erin Bradford will identify cognitive patterns and beliefs that support depression  Goal: STG: Erin Bradford will practice behavioral activation skills 1 times per week for the next 12 weeks  Goal: STG: Erin Bradford will identify internal and external stimuli that trigger PTSD symptoms  Goal: STG: Erin Bradford will verbalize an increased sense of mastery over PTSD symptoms by using several techniques to cope with flashbacks, decrease the power of triggers, and decrease negative thinking  Goal: STG: Erin Bradford will cooperate with treatment in an effort to reduce PCL-5 assessment scores  Goal: LTG: Learn the types of boundaries, the necessity, and how to implement boundaries even when it is difficult   Goal: LTG: Learn breathing techniques and grounding techniques and demonstrate mastery in session then report independent use of these skills out of session.     ProgressTowards Goals: Progressing  Interventions: CBT and Supportive   Summary: Erin Bradford is a 52 y.o. female who presents with long-term depression and anxiety for therapy which she has always previously turned down.  She presented oriented x5 and stated she was feeling "drained and tired."  CSW evaluated patient's medication compliance, use of coping tools, and self-care, as applicable.   CSW immediately noticed and commented on her room being closed up and dark, saying "I just don't feel like it."  CSW reviewed CBT, specifically behavioral activation and how it works, once more throughout the course of the session as we talked about a variety of things going on in her life right now.  She reported that her OCD has been activated and she has been tiring herself out with cleaning  activities, but as she has ordered various objects to decorate her room with, she looks at them then returns them to the boxes.  Now it is overwhelming to think about putting them all out at once.  CSW directed the discussion toward how we can  actively seek joy.  As we talked about how doing enjoyable things changes the way we feel, and thinking differently changes the way we feel, she concluded by the end of the session that she wanted to go ahead and start putting up her new decorations today.    We discussed her schooling which will end the first part of December, and her daughter's efforts to get back into school at [employer] although they have been unsuccessful in contacting the [employer] of Students staff person who is supposed to help with this action.  Feelings around this were processed and she was encouraged to look at whether her thoughts leading to those feelings were factual/helpful/beneficial or needed to be replaced.  Suicidal/Homicidal: No without intent/plan  Therapist Response:   Patient is progressing AEB engaging in scheduled therapy session.  Throughout the session, CSW gave patient the opportunity to explore thoughts and feelings associated with current life situations and past/present stressors.   CSW challenged patient gently and appropriately to consider different ways of looking at reported issues. CSW encouraged patient's expression of feelings and validated these using empathy, active listening, open body language, and unconditional positive regard.   CSW encouraged patient to schedule more therapy sessions for the future, as needed.    Plan: Return at first available appointment, then every 2 weeks.  Recommendations:  Return to therapy ASAP, engage in self care behaviors, continue to review cognitive distortions and how to challenge them (specific to her daughter's schooling situation right now as well as the election)  Diagnosis:  PTSD (post-traumatic stress disorder)  MDD (major depressive disorder), recurrent episode, mild (HCC)  GAD (generalized anxiety disorder)  Collaboration of Care: Psychiatrist AEB - psychiatric provider can read therapy notes, therapist can read psychiatric notes  Patient/Guardian was  advised Release of Information must be obtained prior to any record release in order to collaborate their care with an outside provider. Patient/Guardian was advised if they have not already done so to contact the registration department to sign all necessary forms in order for [place] to release information regarding their care.   Consent: Patient/Guardian gives verbal consent for treatment and assignment of benefits for services provided during this visit. Patient/Guardian expressed understanding and agreed to proceed.   Erin Chad, LCSW 11/10/2023

## 2023-11-11 ENCOUNTER — Ambulatory Visit: Payer: 59 | Admitting: Family Medicine

## 2023-11-14 ENCOUNTER — Encounter: Payer: Self-pay | Admitting: Oncology

## 2023-11-28 ENCOUNTER — Encounter: Payer: Self-pay | Admitting: Oncology

## 2023-11-28 NOTE — Progress Notes (Signed)
Patient has not been seen by this provider.

## 2023-12-02 ENCOUNTER — Other Ambulatory Visit: Payer: Self-pay | Admitting: Cardiology

## 2023-12-05 ENCOUNTER — Encounter (HOSPITAL_COMMUNITY): Payer: Self-pay | Admitting: Psychiatry

## 2023-12-05 ENCOUNTER — Telehealth (HOSPITAL_COMMUNITY): Payer: 59 | Admitting: Psychiatry

## 2023-12-05 VITALS — Wt 262.0 lb

## 2023-12-05 DIAGNOSIS — F330 Major depressive disorder, recurrent, mild: Secondary | ICD-10-CM | POA: Diagnosis not present

## 2023-12-05 DIAGNOSIS — F5101 Primary insomnia: Secondary | ICD-10-CM | POA: Diagnosis not present

## 2023-12-05 DIAGNOSIS — F411 Generalized anxiety disorder: Secondary | ICD-10-CM

## 2023-12-05 MED ORDER — BUPROPION HCL ER (XL) 300 MG PO TB24: 300.0000 mg | ORAL_TABLET | Freq: Every day | ORAL | 0 refills | Status: DC

## 2023-12-05 MED ORDER — SERTRALINE HCL 100 MG PO TABS: 100.0000 mg | ORAL_TABLET | Freq: Every day | ORAL | 0 refills | Status: DC

## 2023-12-05 MED ORDER — ZOLPIDEM TARTRATE ER 12.5 MG PO TBCR: 12.5000 mg | EXTENDED_RELEASE_TABLET | Freq: Every evening | ORAL | 2 refills | Status: DC | PRN
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
## 2023-12-18 ENCOUNTER — Other Ambulatory Visit: Payer: Self-pay | Admitting: Neurology

## 2023-12-20 NOTE — Telephone Encounter (Signed)
Sent up front for a one year appointment follow up appointment. I am sending in a 30 day RX

## 2024-01-13 ENCOUNTER — Ambulatory Visit: Payer: 59 | Admitting: Family Medicine

## 2024-01-13 ENCOUNTER — Other Ambulatory Visit (HOSPITAL_COMMUNITY): Payer: Self-pay | Admitting: Psychiatry

## 2024-01-13 DIAGNOSIS — F5101 Primary insomnia: Secondary | ICD-10-CM

## 2024-01-20 ENCOUNTER — Encounter (HOSPITAL_COMMUNITY): Payer: Self-pay | Admitting: Clinical

## 2024-01-20 ENCOUNTER — Ambulatory Visit (INDEPENDENT_AMBULATORY_CARE_PROVIDER_SITE_OTHER): Payer: 59 | Admitting: Clinical

## 2024-01-20 DIAGNOSIS — F330 Major depressive disorder, recurrent, mild: Secondary | ICD-10-CM

## 2024-01-20 DIAGNOSIS — F411 Generalized anxiety disorder: Secondary | ICD-10-CM | POA: Diagnosis not present

## 2024-01-20 DIAGNOSIS — F4310 Post-traumatic stress disorder, unspecified: Secondary | ICD-10-CM
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
## 2024-01-28 ENCOUNTER — Encounter: Payer: Self-pay | Admitting: Oncology

## 2024-01-29 ENCOUNTER — Other Ambulatory Visit (HOSPITAL_COMMUNITY): Payer: Self-pay | Admitting: Psychiatry

## 2024-01-29 DIAGNOSIS — F411 Generalized anxiety disorder: Secondary | ICD-10-CM

## 2024-01-29 DIAGNOSIS — F330 Major depressive disorder, recurrent, mild: Secondary | ICD-10-CM

## 2024-02-03 ENCOUNTER — Ambulatory Visit (INDEPENDENT_AMBULATORY_CARE_PROVIDER_SITE_OTHER): Payer: 59 | Admitting: Clinical

## 2024-02-03 DIAGNOSIS — Z91199 Patient's noncompliance with other medical treatment and regimen due to unspecified reason: Secondary | ICD-10-CM

## 2024-02-03 NOTE — Progress Notes (Signed)
 Kameo R Lehr    CSW attempted to connect with patient for scheduled appointment via MyChart video text request x 2 and email request with no response.  This is her first no-show.      Attempt 1: Text and email: 11:00am      Attempt 2: Text: 11:05am       Left video chat open until:  11:15am      Per [facility] policy, after multiple attempts to reach patient unsuccessfully at appointed time, visit will be coded as a no show.    Encounter Diagnosis  Name Primary?   No-show for appointment Yes       Elgie Crest, LCSW 02/03/2024, 11:16 AM

## 2024-02-05 ENCOUNTER — Other Ambulatory Visit (HOSPITAL_COMMUNITY): Payer: Self-pay | Admitting: Psychiatry

## 2024-02-05 DIAGNOSIS — F330 Major depressive disorder, recurrent, mild: Secondary | ICD-10-CM

## 2024-02-10 ENCOUNTER — Telehealth: Payer: 59 | Admitting: Family Medicine

## 2024-02-10 ENCOUNTER — Encounter: Payer: Self-pay | Admitting: Oncology

## 2024-02-10 DIAGNOSIS — R109 Unspecified abdominal pain: Secondary | ICD-10-CM | POA: Diagnosis not present

## 2024-02-10 MED ORDER — NITROFURANTOIN MONOHYD MACRO 100 MG PO CAPS: 100.0000 mg | ORAL_CAPSULE | Freq: Two times a day (BID) | ORAL | 0 refills | Status: AC
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
## 2024-02-10 NOTE — Patient Instructions (Signed)
Flank Pain, Adult  Flank pain is pain that is located on the side of the body between the upper abdomen and the spine. This area is called the flank. The pain may occur over a short period of time (acute), or it may be long-term or recurring (chronic). It may be mild or severe. Flank pain can be caused by many things, including:  Muscle soreness or injury.  Kidney infection, kidney stones, or kidney disease.  Stress.  A disease of the spine (vertebral disk disease).  A lung infection (pneumonia).  Fluid around the lungs (pulmonary edema).  A skin rash caused by the chickenpox virus (shingles).  Tumors that affect the back of the abdomen.  Gallbladder disease.  Follow these instructions at home:    Drink enough fluid to keep your urine pale yellow.  Rest as told by your health care provider.  Take over-the-counter and prescription medicines only as told by your health care provider.  Keep a journal to track what has caused your flank pain and what has made it feel better.  Keep all follow-up visits. This is important.  Contact a health care provider if:  Your pain is not controlled with medicine.  You have new symptoms.  Your pain gets worse.  Your symptoms last longer than 2-3 days.  You have trouble urinating or you are urinating very frequently.  Get help right away if:  You have trouble breathing or you are short of breath.  Your abdomen hurts or it is swollen or red.  You have nausea or vomiting.  You feel faint, or you faint.  You have blood in your urine.  You have flank pain and a fever.  These symptoms may represent a serious problem that is an emergency. Do not wait to see if the symptoms will go away. Get medical help right away. Call your local emergency services (911 in the [place] Do not drive yourself to the hospital.  Summary  Flank pain is pain that is located on the side of the body between the upper abdomen and the spine.  The pain may occur over a short period of time (acute), or it may be  long-term or recurring (chronic). It may be mild or severe.  Flank pain can be caused by many things.  Contact your health care provider if your symptoms get worse or last longer than 2-3 days.  This information is not intended to replace advice given to you by your health care provider. Make sure you discuss any questions you have with your health care provider.  Document Revised: 02/23/2021 Document Reviewed: 02/23/2021  Elsevier Patient Education  2024 [employer].

## 2024-02-17 ENCOUNTER — Ambulatory Visit (INDEPENDENT_AMBULATORY_CARE_PROVIDER_SITE_OTHER): Payer: 59 | Admitting: Clinical

## 2024-02-17 ENCOUNTER — Other Ambulatory Visit: Payer: Self-pay | Admitting: Neurology

## 2024-02-17 DIAGNOSIS — Z91199 Patient's noncompliance with other medical treatment and regimen due to unspecified reason: Secondary | ICD-10-CM

## 2024-02-19 NOTE — Progress Notes (Signed)
 Erin Bradford    CSW attempted to connect with patient for scheduled appointment via MyChart video text request x 2 and email request with no response; also attempted to connect via phone without success. CSW left message for patient to call office to reschedule therapy appointment.        Attempt 1: Text and email: 11:01am      Attempt 2: Text: 11:08am    Left video chat open until:  11:15am      Per [place] policy, after multiple attempts to reach patient unsuccessfully at appointed time, visit will be coded as a no show.    Encounter Diagnosis  Name Primary?   No-show for appointment Yes       Ambrose Mantle, LCSW 02/19/2024, 2:58 PM

## 2024-02-21 ENCOUNTER — Other Ambulatory Visit: Payer: Self-pay | Admitting: Nephrology

## 2024-02-21 DIAGNOSIS — R7989 Other specified abnormal findings of blood chemistry: Secondary | ICD-10-CM

## 2024-02-22 ENCOUNTER — Other Ambulatory Visit: Payer: 59

## 2024-02-27 ENCOUNTER — Other Ambulatory Visit: Payer: 59

## 2024-02-29 ENCOUNTER — Other Ambulatory Visit

## 2024-02-29 ENCOUNTER — Other Ambulatory Visit: Payer: Self-pay | Admitting: Nurse Practitioner

## 2024-02-29 DIAGNOSIS — J4540 Moderate persistent asthma, uncomplicated: Secondary | ICD-10-CM

## 2024-02-29 NOTE — Telephone Encounter (Signed)
 Please advise [facility]

## 2024-03-02 ENCOUNTER — Encounter (HOSPITAL_COMMUNITY): Payer: Self-pay | Admitting: Clinical

## 2024-03-02 ENCOUNTER — Ambulatory Visit (HOSPITAL_COMMUNITY): Payer: 59 | Admitting: Clinical

## 2024-03-02 DIAGNOSIS — F411 Generalized anxiety disorder: Secondary | ICD-10-CM

## 2024-03-02 DIAGNOSIS — F330 Major depressive disorder, recurrent, mild: Secondary | ICD-10-CM | POA: Diagnosis not present

## 2024-03-02 DIAGNOSIS — F4310 Post-traumatic stress disorder, unspecified: Secondary | ICD-10-CM

## 2024-03-02 NOTE — Progress Notes (Signed)
 THERAPIST PROGRESS NOTE  Session Time: 11:00am-12:00pm  Session #9  Virtual Visit via Video Note  I connected with Erin Bradford on 03/02/24 at 11:00 AM EST by a video enabled telemedicine application and verified that I am speaking with the correct person using two identifiers.  Location: Patient: home Provider: [facility]   I discussed the limitations of evaluation and management by telemedicine and the availability of in person appointments. The patient expressed understanding and agreed to proceed.   I discussed the assessment and treatment plan with the patient. The patient was provided an opportunity to ask questions and all were answered. The patient agreed with the plan and demonstrated an understanding of the instructions.   The patient was advised to call back or seek an in-person evaluation if the symptoms worsen or if the condition fails to improve as anticipated.  I provided 60 minutes of non-face-to-face time during this encounter.  [name], LCSW   Participation Level: Active  Behavioral Response: Casual Alert Euthymic   Type of Therapy: Individual Therapy  Treatment Goals addressed:   Goal: STG: Lagretta will attend at least 80% of scheduled IOP sessions  (Resolved)  Goal: STG: Kourtney will participate in at least 80% of scheduled individual psychotherapy sessions   Goal: STG: Kemba will complete at least 80% of assigned homework   Goal: LTG: Captola will score less than 5 on the Generalized Anxiety Disorder 7 Scale (GAD-7)   Goal: STG: Torri will practice problem solving skills 3 times per week for the next 4 weeks.   Goal: STG: Ryleah will reduce frequency of avoidant behaviors by 50% as evidenced by self-report in therapy sessions  Goal: LTG: Increase coping skills to manage depression and improve ability to perform daily activities  Goal: LTG: Dinna will score less than 9 on the Patient Health  Questionnaire (PHQ-9)   Goal: STG: Donnetta will identify cognitive patterns and beliefs that support depression  Goal: STG: Coreena will practice behavioral activation skills 1 times per week for the next 12 weeks  Goal: STG: Wilda will identify internal and external stimuli that trigger PTSD symptoms  Goal: STG: Giah will verbalize an increased sense of mastery over PTSD symptoms by using several techniques to cope with flashbacks, decrease the power of triggers, and decrease negative thinking  Goal: STG: Lusia will cooperate with treatment in an effort to reduce PCL-5 assessment scores  Goal: LTG: Learn the types of boundaries, the necessity, and how to implement boundaries even when it is difficult   Goal: LTG: Learn breathing techniques and grounding techniques and demonstrate mastery in session then report independent use of these skills out of session.    ProgressTowards Goals: Progressing  Interventions: Strength-based and Supportive   Summary: Erin Bradford is a 53 y.o. female who presents with long-term depression and anxiety for therapy which she has always previously turned down.  She presented oriented x5 and stated she was feeling "okay."  CSW evaluated patient's medication compliance, use of coping tools, and self-care, as applicable.  Patient verbalized difficulties faced with recent court appearance and successes that she experienced when she used coping skills to remain grounded and calm.  Ultimately, she and her daughter dropped their assault charges against nephew and the girlfriend's assault charges against them were dropped.  Patient reflected on how differently they were dressed and acted in the court setting than most other attendees, as well as how they were treated throughout this incident by the court system.  She has now received her associates degree and thus has finished all her basic classes, has moved on to work on her undergraduate degree in criminal justice.   Her own experience with this system has been overwhelming.  CSW challenged patient to explore alternative perspectives and identify/examine her cognitive distortions.  She also described personal growth that has occurred as the result of this experience.  Together we explored whether she will want to work in the criminal justice system.  She was excited when CSW suggested mentioned using her psychology classes and criminal justice classes together to consider being a mediator.  When challenged to name 5 good things about herself, she initially was adamant she could not do so, the she named 5 academic accomplishments such as being in the honors society.  CSW pointed out the many positive qualities that ensured she could achieve those things, such as perseverance, intelligence, and passion.  She expressed positive insight and acknowledged that this is not normally the way she thinks.  Patient shared that her oldest daughter is coming to visit for 1 week soon, after they have not seen each other in 15 years.  She shared memories of giving her daughter the dog she has now as an emotional support animal, stated the dog has kept daughter from suicide numerous times.  Hopes for the visit were shared.  CSW encouraged her to work toward walking away from confrontation until she has time to process and decide on actions to take, rather than reacting emotionally which has led to several estrangements (one of which just recently ended).  She articulated her motivation to follow through on this as well as her vulnerability to speaking in the moment. That is similar to her daughter's.  Patient and CSW processed past emotional reactions, especially the overall result, and pattern of relating to her oldest daughter, engaged in brainstorming how to make this time different.  Suicidal/Homicidal: No without intent/plan  Therapist Response:   Patient is progressing AEB engaging in scheduled therapy session.  Throughout the  session, CSW gave patient the opportunity to explore thoughts and feelings associated with current life situations and past/present stressors.   CSW challenged patient gently and appropriately to consider different ways of looking at reported issues. CSW encouraged patient's expression of feelings and validated these using empathy, active listening, open body language, and unconditional positive regard. We scheduled 2 additional appointments     Plan: Return in 2 weeks on 3/21  Recommendations:  Return to therapy every 2 weeks, engage in self care behaviors, continue to consider her good qualities  Diagnosis:  PTSD (post-traumatic stress disorder)  MDD (major depressive disorder), recurrent episode, mild (HCC)  GAD (generalized anxiety disorder)  Collaboration of Care: Psychiatrist AEB - psychiatric provider can read therapy notes, therapist can read psychiatric notes  Patient/Guardian was advised Release of Information must be obtained prior to any record release in order to collaborate their care with an outside provider. Patient/Guardian was advised if they have not already done so to contact the registration department to sign all necessary forms in order for [place] to release information regarding their care.   Consent: Patient/Guardian gives verbal consent for treatment and assignment of benefits for services provided during this visit. Patient/Guardian expressed understanding and agreed to proceed.   [name], LCSW 03/02/2024

## 2024-03-05 ENCOUNTER — Encounter (HOSPITAL_COMMUNITY): Payer: Self-pay | Admitting: Psychiatry

## 2024-03-05 ENCOUNTER — Telehealth (HOSPITAL_COMMUNITY): Payer: 59 | Admitting: Psychiatry

## 2024-03-05 DIAGNOSIS — F411 Generalized anxiety disorder: Secondary | ICD-10-CM

## 2024-03-05 DIAGNOSIS — F330 Major depressive disorder, recurrent, mild: Secondary | ICD-10-CM

## 2024-03-05 DIAGNOSIS — F5101 Primary insomnia: Secondary | ICD-10-CM | POA: Diagnosis not present

## 2024-03-05 MED ORDER — ZOLPIDEM TARTRATE ER 12.5 MG PO TBCR: 12.5000 mg | EXTENDED_RELEASE_TABLET | Freq: Every evening | ORAL | 2 refills | Status: DC | PRN

## 2024-03-05 MED ORDER — BUPROPION HCL ER (XL) 300 MG PO TB24: 300.0000 mg | ORAL_TABLET | Freq: Every day | ORAL | 0 refills | Status: DC

## 2024-03-05 MED ORDER — SERTRALINE HCL 100 MG PO TABS: 100.0000 mg | ORAL_TABLET | Freq: Every day | ORAL | 0 refills | Status: DC

## 2024-03-05 NOTE — Progress Notes (Signed)
 [facility] MD Virtual Progress Note   Patient Location: Home Provider Location: Home Office  I connect with patient by telephone and verified that I am speaking with correct person by using two identifiers. I discussed the limitations of evaluation and management by telemedicine and the availability of in person appointments. I also discussed with the patient that there may be a patient responsible charge related to this service. The patient expressed understanding and agreed to proceed.  Erin Bradford [id number] 53 y.o.  03/05/2024 3:53 PM  History of Present Illness:  Patient is evaluated by phone session.  She forgot this appointment and could not do the video but able to do on the phone.  She told her oldest daughter is now visiting from [place] and she is busy with her and had a good time.  She also pleased that charges are dropped and she does not have any more court hearing.  She is compliant with Ambien CR, Wellbutrin and Zoloft.  She has no tremor or shakes or any EPS.  She denies any feeling of hopelessness or worthlessness.  Recently she had a visit with her primary care but she was not happy because she had urinary symptoms and labs shows UTI but she was not given antibiotic until very late.  Patient told her PCP and nephrologist is not in [facility].  She is on gabapentin.  She is no longer taking losartan because her blood pressure is much better and actually it was lower than before and medicine was causing further lower reading.  Her therapy with Ms. Lucretia Field is going well.  She denies any paranoia, hallucination, anger, irritability.  Her younger daughter lives with the patient.  Patient does not want to change the medication since it is working well.  In the past she had tried lowering the Ambien but could not sleep.  Patient denies drinking or using any illegal substances.  Denies any major panic attack and does not feel overwhelmed as she used to  before.  Past Psychiatric History: H/O depression.  No h/o inpatient, mania, psychosis or suicidal attempt.  Tried Prozac which worked but had weight gain.  Tried Vistaril, trazodone, Thorazine, Lunesta 3 mg, Doxepin 50 mg and saphris 5 mg with poor outcome.  Lamictal caused rash and temazepam caused headache.     Outpatient Encounter Medications as of 03/05/2024  Medication Sig   albuterol (PROVENTIL) (2.5 MG/3ML) 0.083% nebulizer solution USE 1 VIAL VIA NEBULIZER EVERY 6 HOURS AS NEEDED FOR WHEEZING OR  SHORTNESS OF BREATH   AMITIZA 24 MCG capsule    buPROPion (WELLBUTRIN XL) 300 MG 24 hr tablet Take 1 tablet (300 mg total) by mouth daily.   carvedilol (COREG) 25 MG tablet TAKE 1 TABLET BY MOUTH TWICE  DAILY   cetirizine (ZYRTEC) 10 MG tablet TAKE 1 TABLET(10 MG) BY MOUTH AT BEDTIME AS NEEDED FOR ALLERGIES   famotidine (PEPCID) 20 MG tablet TAKE 1 TABLET(20 MG) BY MOUTH TWICE DAILY   fluticasone (FLONASE) 50 MCG/ACT nasal spray SHAKE LIQUID AND USE 2 SPRAYS IN EACH NOSTRIL DAILY   furosemide (LASIX) 20 MG tablet Take 20 mg by mouth 2 (two) times daily.    gabapentin (NEURONTIN) 300 MG capsule Take 2 capsules by mouth 3 (three) times daily.   Glucose Blood (BLOOD GLUCOSE TEST STRIPS) STRP  (Patient not taking: Reported on 03/23/2023)   LOMAIRA 8 MG TABS Take 1 tablet by mouth 2 (two) times daily. (Patient not taking: Reported on 03/23/2023)  losartan (COZAAR) 100 MG tablet Take 1 tablet (100 mg total) by mouth daily.   NARCAN 4 MG/0.1ML LIQD nasal spray kit Place 1 spray into the nose as directed.   oxybutynin (DITROPAN-XL) 10 MG 24 hr tablet TAKE 1 TABLET BY MOUTH AT  BEDTIME   oxyCODONE-acetaminophen (PERCOCET) 10-325 MG tablet Take 1 tablet by mouth every 8 (eight) hours as needed for pain.   primidone (MYSOLINE) 50 MG tablet TAKE 1 TABLET BY MOUTH EVERY  MORNING AND TAKE 6 TABLETS BY  MOUTH IN THE EVENING   sertraline (ZOLOFT) 100 MG tablet Take 1 tablet (100 mg total) by mouth daily.    spironolactone (ALDACTONE) 100 MG tablet Take 100 mg by mouth 2 (two) times daily.   SYMBICORT 80-4.5 MCG/ACT inhaler USE 2 INHALATIONS BY MOUTH TWICE DAILY   tiZANidine (ZANAFLEX) 4 MG tablet Take 4 mg by mouth 3 (three) times daily.   VENTOLIN HFA 108 (90 Base) MCG/ACT inhaler USE 2 INHALATIONS BY MOUTH EVERY 6 HOURS AS NEEDED FOR WHEEZING  OR SHORTNESS OF BREATH   zolpidem (AMBIEN CR) 12.5 MG CR tablet Take 1 tablet (12.5 mg total) by mouth at bedtime as needed for sleep.   No facility-administered encounter medications on file as of 03/05/2024.    No results found for this or any previous visit (from the past 2160 hours).   Psychiatric Specialty Exam: Physical Exam  Review of Systems  Weight 262 lb (118.8 kg), last menstrual period 09/17/2017.There is no height or weight on file to calculate BMI.  General Appearance: NA  Eye Contact:  NA  Speech:  Normal Rate  Volume:  Normal  Mood:  Euthymic  Affect:  NA  Thought Process:  Goal Directed  Orientation:  Full (Time, Place, and Person)  Thought Content:  Logical  Suicidal Thoughts:  No  Homicidal Thoughts:  No  Memory:  Immediate;   Good Recent;   Good Remote;   Good  Judgement:  Intact  Insight:  Present  Psychomotor Activity:  NA  Concentration:  Concentration: Good and Attention Span: Good  Recall:  Good  Fund of Knowledge:  Good  Language:  Good  Akathisia:  No  Handed:  Right  AIMS (if indicated):     Assets:  Communication Skills Desire for Improvement Housing Social Support Transportation  ADL's:  Intact  Cognition:  WNL  Sleep:  ok     Assessment/Plan: MDD (major depressive disorder), recurrent episode, mild (HCC) - Plan: buPROPion (WELLBUTRIN XL) 300 MG 24 hr tablet, sertraline (ZOLOFT) 100 MG tablet  GAD (generalized anxiety disorder) - Plan: sertraline (ZOLOFT) 100 MG tablet  Primary insomnia - Plan: zolpidem (AMBIEN CR) 12.5 MG CR tablet  Patient doing better on her current medication.  Since legal  charges dropped she is more relaxed.  She does not want to change the medication.  Continue Wellbutrin XL 300 mg daily, Zoloft 1 mg daily and Ambien CR 12.5 mg as needed.  She is also on gabapentin and pain medicine from other provider.  Discussed polypharmacy and risk of dependency.  I have recommended to send [place] the blood work results which was done recently by her primary care.  Follow-up in 3 months.  Encouraged to continue therapy with Ms. Lucretia Field.   Follow Up Instructions:     I discussed the assessment and treatment plan with the patient. The patient was provided an opportunity to ask questions and all were answered. The patient agreed with the plan and demonstrated an understanding of the  instructions.   The patient was advised to call back or seek an in-person evaluation if the symptoms worsen or if the condition fails to improve as anticipated.    Collaboration of Care: Other provider involved in patient's care AEB notes are available in epic to review  Patient/Guardian was advised Release of Information must be obtained prior to any record release in order to collaborate their care with an outside provider. Patient/Guardian was advised if they have not already done so to contact the registration department to sign all necessary forms in order for [place] to release information regarding their care.   Consent: Patient/Guardian gives verbal consent for treatment and assignment of benefits for services provided during this visit. Patient/Guardian expressed understanding and agreed to proceed.     I provided 18 minutes of non face to face time during this encounter.  Note: This document was prepared by [employer] voice dictation technology and any errors that results from this process are unintentional.    Cleotis Nipper, MD 03/05/2024

## 2024-03-07 ENCOUNTER — Other Ambulatory Visit

## 2024-03-08 ENCOUNTER — Encounter: Payer: Self-pay | Admitting: Oncology

## 2024-03-13 ENCOUNTER — Other Ambulatory Visit

## 2024-03-16 ENCOUNTER — Encounter (HOSPITAL_COMMUNITY): Payer: Self-pay | Admitting: Clinical

## 2024-03-16 ENCOUNTER — Ambulatory Visit (HOSPITAL_COMMUNITY): Payer: 59 | Admitting: Clinical

## 2024-03-16 DIAGNOSIS — Z8659 Personal history of other mental and behavioral disorders: Secondary | ICD-10-CM

## 2024-03-16 DIAGNOSIS — F411 Generalized anxiety disorder: Secondary | ICD-10-CM | POA: Diagnosis not present

## 2024-03-16 DIAGNOSIS — F330 Major depressive disorder, recurrent, mild: Secondary | ICD-10-CM | POA: Diagnosis not present

## 2024-03-16 NOTE — Progress Notes (Signed)
 THERAPIST PROGRESS NOTE  Session Time: 11:06am-12:03pm  Session #10  Virtual Visit via Video Note  I connected with Erin Bradford on 03/16/24 at 11:00 AM EDT by a video enabled telemedicine application and verified that I am speaking with the correct person using two identifiers.  Location: Patient: home Provider: [facility]   I discussed the limitations of evaluation and management by telemedicine and the availability of in person appointments. The patient expressed understanding and agreed to proceed.   I discussed the assessment and treatment plan with the patient. The patient was provided an opportunity to ask questions and all were answered. The patient agreed with the plan and demonstrated an understanding of the instructions.   The patient was advised to call back or seek an in-person evaluation if the symptoms worsen or if the condition fails to improve as anticipated.  I provided 57 minutes of non-face-to-face time during this encounter.  Erin Chad, LCSW   Participation Level: Active  Behavioral Response: Casual Alert Euthymic   Type of Therapy: Individual Therapy  Treatment Goals addressed:   New treatment goals established and completed treatment goals deleted: LTG: Increase coping skills to manage depression and improve ability to perform daily activities  LTG: Erin Bradford will score less than 9 on the Patient Health Questionnaire (PHQ-9)  STG: Erin Bradford will identify cognitive patterns and beliefs that support depression  STG: Erin Bradford will practice behavioral activation skills 1 times per week for the next 12 weeks  LTG: Erin Bradford will score less than 5 on the Generalized Anxiety Disorder 7 Scale (GAD-7)  STG: Erin Bradford will practice problem solving skills 3 times per week for the next 4 weeks.  STG: Erin Bradford will reduce frequency of avoidant behaviors by 50% as evidenced by self-report in therapy sessions  STG: Erin Bradford will  identify internal and external stimuli that trigger PTSD symptoms  LTG: Learn the types of boundaries, the necessity, and how to implement boundaries even when it is difficult  LTG: Learn breathing techniques and grounding techniques and demonstrate mastery in session then report independent use of these skills out of session. LTG: Process life events to the extent needed so that will be able to move forward with various areas of life in a better frame of mind per self-report.   LTG: Work on forgiveness, shame, sleep, relationship to food, or other issues as appropriate and as these present during sessions.      ProgressTowards Goals: Progressing  Interventions: CBT, Supportive, and Other: assessment, goals    Summary: Erin Bradford is a 52 y.o. female who presents with long-term depression and anxiety for therapy which she has always previously turned downshouldere presented oriented x5 and stated she was feeling "still regrouping from my daughter's visit."  CSW evaluated patient's medication compliance, use of coping tools, and self-care, as applicable.  She provided an update on various aspects of her life that are normally discussed in therapy, including daughter's visit for the first time in 15 years, loss of appetite and weight, reduced anxiety about court issues, and ongoing conflicts with mother.  We discussed at length the visit from her oldest daughter which was the first time they have seen each other in 15 years, spending much of that time estranged.  There was stress involved, particularly between their 2 dogs, but they were able to talk honestly about issues as they arose and it was a loving and enjoyable time which she believes will continue now that they are back in relationship.  Her middle daughter remains estranged from every family member, and patient recognized a shift in her thoughts about needing to have that contact on her own timetable.  She attended to her own mental health  and emotional wellbeing throughout the visit with daughter and is able to identify ways in which she has increasingly done the same with her mother throughout the time she has been in therapy, feels she has changed a lot.  We performed a PHQ-9 and GAD-7 assessment and results were provided to her, along with an explanation of what this signifies.  Her PHQ-9 score went from 20 at the beginning of therapy in June 2024 to 11 today (severe to moderate) and her GAD-7 score went from 17 to 7 (severe to mild).  This actually delighted her and enabled her to discuss the progress she has made in her feelings about herself, her ability to leave the house, her relationships with people, and her self-confidence.  She did state she is under eating all the time now, has lost about 15 pounds without trying, because she does not have an appetite and does not really have a taste for food.    Suicidal/Homicidal: No without intent/plan  Therapist Response:   Patient is progressing AEB engaging in scheduled therapy session.  Throughout the session, CSW gave patient the opportunity to explore thoughts and feelings associated with current life situations and past/present stressors.   CSW challenged patient gently and appropriately to consider different ways of looking at reported issues. CSW encouraged patient's expression of feelings and validated these using empathy, active listening, open body language, and unconditional positive regard.     Plan: Return at next scheduled appointment on 5/15  Recommendations:  Return to therapy on 5/15 then every 2 weeks, engage in self care behaviors, continue to consider her good qualities  Diagnosis:  MDD (major depressive disorder), recurrent episode, mild (HCC)  GAD (generalized anxiety disorder)  History of posttraumatic stress disorder (PTSD)  Collaboration of Care: Psychiatrist AEB - psychiatric provider can read therapy notes, therapist can read psychiatric  notes  Patient/Guardian was advised Release of Information must be obtained prior to any record release in order to collaborate their care with an outside provider. Patient/Guardian was advised if they have not already done so to contact the registration department to sign all necessary forms in order for [place] to release information regarding their care.   Consent: Patient/Guardian gives verbal consent for treatment and assignment of benefits for services provided during this visit. Patient/Guardian expressed understanding and agreed to proceed.   Erin Chad, LCSW 03/16/2024     03/16/2024   11:32 AM 06/17/2023   10:07 AM 03/23/2023    3:05 PM 07/05/2018    1:05 PM 05/09/2018    6:03 PM  Depression screen PHQ 2/9  Decreased Interest 1 3 1 2 3   Down, Depressed, Hopeless 1 2 1 2 3   PHQ - 2 Score 2 5 2 4 6   Altered sleeping 2 3 0 2 3  Tired, decreased energy 3 3 3 1 3   Change in appetite 3 3 3  0 0  Feeling bad or failure about yourself  0 2 0 1 2  Trouble concentrating 0 2 0 0 0  Moving slowly or fidgety/restless 1 2 0 0 0  Suicidal thoughts 0 0 0 0 0  PHQ-9 Score 11 20 8 8 14   Difficult doing work/chores  Extremely dIfficult Very difficult        03/16/2024   11:41  AM 06/17/2023   10:10 AM  GAD 7 : Generalized Anxiety Score  Nervous, Anxious, on Edge 2 3  Control/stop worrying 1 3  Worry too much - different things 2 3  Trouble relaxing 0 3  Restless 0 1  Easily annoyed or irritable 1 2  Afraid - awful might happen 1 2  Total GAD 7 Score 7 17  Anxiety Difficulty Somewhat difficult Extremely difficult

## 2024-03-17 ENCOUNTER — Other Ambulatory Visit: Payer: Self-pay | Admitting: Neurology

## 2024-03-19 ENCOUNTER — Other Ambulatory Visit: Payer: Self-pay

## 2024-03-19 ENCOUNTER — Telehealth: Payer: Self-pay | Admitting: Neurology

## 2024-03-19 NOTE — Telephone Encounter (Signed)
 1. Which medications need refilled? (List name and dosage, if known) primidone - she scheduled a follow up for 09/24/24  2. Which pharmacy/location is medication to be sent to? (include street and city if local pharmacy) [facility] [address]

## 2024-03-19 NOTE — Telephone Encounter (Signed)
 Sent to Dr.Jaffe to sign RX.

## 2024-03-20 ENCOUNTER — Other Ambulatory Visit: Payer: Self-pay | Admitting: Neurology

## 2024-03-20 MED ORDER — PRIMIDONE 50 MG PO TABS: ORAL_TABLET | ORAL | 0 refills | Status: DC

## 2024-04-03 ENCOUNTER — Ambulatory Visit (HOSPITAL_COMMUNITY): Admitting: Clinical

## 2024-04-03 ENCOUNTER — Encounter: Payer: Self-pay | Admitting: Oncology

## 2024-04-04 ENCOUNTER — Ambulatory Visit (HOSPITAL_COMMUNITY): Admitting: Clinical

## 2024-04-04 ENCOUNTER — Other Ambulatory Visit: Payer: Self-pay | Admitting: Nurse Practitioner

## 2024-04-04 ENCOUNTER — Encounter (HOSPITAL_COMMUNITY): Payer: Self-pay | Admitting: Clinical

## 2024-04-04 DIAGNOSIS — F330 Major depressive disorder, recurrent, mild: Secondary | ICD-10-CM | POA: Diagnosis not present

## 2024-04-04 DIAGNOSIS — Z8659 Personal history of other mental and behavioral disorders: Secondary | ICD-10-CM | POA: Diagnosis not present

## 2024-04-04 DIAGNOSIS — F411 Generalized anxiety disorder: Secondary | ICD-10-CM | POA: Diagnosis not present

## 2024-04-04 DIAGNOSIS — R350 Frequency of micturition: Secondary | ICD-10-CM
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
## 2024-04-07 ENCOUNTER — Telehealth: Admitting: Family Medicine

## 2024-04-07 DIAGNOSIS — N3289 Other specified disorders of bladder: Secondary | ICD-10-CM | POA: Diagnosis not present

## 2024-04-07 DIAGNOSIS — R350 Frequency of micturition: Secondary | ICD-10-CM | POA: Diagnosis not present

## 2024-04-07 MED ORDER — PHENAZOPYRIDINE HCL 200 MG PO TABS: 200.0000 mg | ORAL_TABLET | Freq: Three times a day (TID) | ORAL | 0 refills | Status: DC | PRN

## 2024-04-07 MED ORDER — NITROFURANTOIN MONOHYD MACRO 100 MG PO CAPS: 100.0000 mg | ORAL_CAPSULE | Freq: Two times a day (BID) | ORAL | 0 refills | Status: DC
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
## 2024-04-07 NOTE — Patient Instructions (Signed)

## 2024-04-10 ENCOUNTER — Encounter (HOSPITAL_COMMUNITY): Payer: Self-pay

## 2024-04-10 ENCOUNTER — Emergency Department (HOSPITAL_COMMUNITY)
Admission: EM | Admit: 2024-04-10 | Discharge: 2024-04-11 | Discharge status: Against Medical Advice | Source: Home / Self Care

## 2024-04-10 DIAGNOSIS — K3533 Acute appendicitis with perforation and localized peritonitis, with abscess: Secondary | ICD-10-CM | POA: Diagnosis not present

## 2024-04-10 DIAGNOSIS — Z5321 Procedure and treatment not carried out due to patient leaving prior to being seen by health care provider: Secondary | ICD-10-CM | POA: Insufficient documentation

## 2024-04-10 DIAGNOSIS — R109 Unspecified abdominal pain: Secondary | ICD-10-CM | POA: Insufficient documentation

## 2024-04-10 DIAGNOSIS — R112 Nausea with vomiting, unspecified: Secondary | ICD-10-CM | POA: Insufficient documentation

## 2024-04-10 LAB — CBC
HCT: 38.2 % (ref 36.0–46.0)
Hemoglobin: 12.6 g/dL (ref 12.0–15.0)
MCH: 29.1 pg (ref 26.0–34.0)
MCHC: 33 g/dL (ref 30.0–36.0)
MCV: 88.2 fL (ref 80.0–100.0)
Platelets: 581 10*3/uL — ABNORMAL HIGH (ref 150–400)
RBC: 4.33 MIL/uL (ref 3.87–5.11)
RDW: 12.3 % (ref 11.5–15.5)
WBC: 23.4 10*3/uL — ABNORMAL HIGH (ref 4.0–10.5)
nRBC: 0 % (ref 0.0–0.2)

## 2024-04-10 LAB — COMPREHENSIVE METABOLIC PANEL WITH GFR
ALT: 44 U/L (ref 0–44)
AST: 27 U/L (ref 15–41)
Albumin: 3.4 g/dL — ABNORMAL LOW (ref 3.5–5.0)
Alkaline Phosphatase: 175 U/L — ABNORMAL HIGH (ref 38–126)
Anion gap: 12 (ref 5–15)
BUN: 6 mg/dL (ref 6–20)
CO2: 23 mmol/L (ref 22–32)
Calcium: 9.4 mg/dL (ref 8.9–10.3)
Chloride: 99 mmol/L (ref 98–111)
Creatinine, Ser: 0.83 mg/dL (ref 0.44–1.00)
GFR, Estimated: >60 mL/min (ref >60)
Glucose, Bld: 156 mg/dL — ABNORMAL HIGH (ref 70–99)
Potassium: 3.7 mmol/L (ref 3.5–5.1)
Sodium: 134 mmol/L — ABNORMAL LOW (ref 135–145)
Total Bilirubin: 0.6 mg/dL (ref 0.0–1.2)
Total Protein: 7.3 g/dL (ref 6.5–8.1)

## 2024-04-10 LAB — LIPASE, BLOOD: Lipase: 20 U/L (ref 11–51)

## 2024-04-10 NOTE — ED Provider Triage Note (Signed)
 Emergency Medicine Provider Triage Evaluation Note  Erin Bradford , a 53 y.o. female  was evaluated in triage.  Pt complains of abdominal pain. Same began 1 week ago when she was stepped on by her dog. States it felt like her "bladder was convulsing."  She did a video visit with urgent care at that time and they thought she may have a UTI and prescribed antibiotics.  She took this without improvement.  4 days ago she started having nausea and vomiting.  She has not had a bowel movement during this time either.  Notes history of hysterectomy, no other history of abdominal surgeries.  No history of similar symptoms previously.  Review of Systems  Positive:  Negative:   Physical Exam  BP 134/85 (BP Location: Right Arm)   Pulse 81   Temp 98.9 F (37.2 C)   Resp 18   LMP 09/17/2017 (Approximate)   SpO2 96%  Gen:   Awake, no distress   Resp:  Normal effort  MSK:   Moves extremities without difficulty  Other:    Medical Decision Making  Medically screening exam initiated at 2:40 PM.  Appropriate orders placed.  Erin Bradford was informed that the remainder of the evaluation will be completed by another provider, this initial triage assessment does not replace that evaluation, and the importance of remaining in the ED until their evaluation is complete.     Erin Dk, PA-C 04/10/24 [id number]
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
## 2024-04-10 NOTE — ED Notes (Signed)
Pt left due to excessive wait time.

## 2024-04-11 ENCOUNTER — Emergency Department (HOSPITAL_BASED_OUTPATIENT_CLINIC_OR_DEPARTMENT_OTHER)

## 2024-04-11 ENCOUNTER — Other Ambulatory Visit: Payer: Self-pay

## 2024-04-11 ENCOUNTER — Encounter (HOSPITAL_BASED_OUTPATIENT_CLINIC_OR_DEPARTMENT_OTHER): Payer: Self-pay | Admitting: Emergency Medicine

## 2024-04-11 ENCOUNTER — Inpatient Hospital Stay (HOSPITAL_BASED_OUTPATIENT_CLINIC_OR_DEPARTMENT_OTHER): Admission: EM | Admit: 2024-04-11 | Discharge: 2024-04-24 | DRG: 330 | Disposition: A | Discharge status: Home Health

## 2024-04-11 DIAGNOSIS — I129 Hypertensive chronic kidney disease with stage 1 through stage 4 chronic kidney disease, or unspecified chronic kidney disease: Secondary | ICD-10-CM | POA: Diagnosis present

## 2024-04-11 DIAGNOSIS — Z5331 Laparoscopic surgical procedure converted to open procedure: Secondary | ICD-10-CM

## 2024-04-11 DIAGNOSIS — K353 Acute appendicitis with localized peritonitis, without perforation or gangrene: Principal | ICD-10-CM

## 2024-04-11 DIAGNOSIS — M545 Low back pain, unspecified: Secondary | ICD-10-CM | POA: Diagnosis present

## 2024-04-11 DIAGNOSIS — Z98891 History of uterine scar from previous surgery: Secondary | ICD-10-CM

## 2024-04-11 DIAGNOSIS — Z87892 Personal history of anaphylaxis: Secondary | ICD-10-CM

## 2024-04-11 DIAGNOSIS — E876 Hypokalemia: Secondary | ICD-10-CM | POA: Diagnosis not present

## 2024-04-11 DIAGNOSIS — D75839 Thrombocytosis, unspecified: Secondary | ICD-10-CM | POA: Diagnosis present

## 2024-04-11 DIAGNOSIS — Z6841 Body Mass Index (BMI) 40.0 and over, adult: Secondary | ICD-10-CM

## 2024-04-11 DIAGNOSIS — E871 Hypo-osmolality and hyponatremia: Secondary | ICD-10-CM | POA: Diagnosis present

## 2024-04-11 DIAGNOSIS — M1990 Unspecified osteoarthritis, unspecified site: Secondary | ICD-10-CM | POA: Diagnosis present

## 2024-04-11 DIAGNOSIS — K3533 Acute appendicitis with perforation and localized peritonitis, with abscess: Principal | ICD-10-CM | POA: Diagnosis present

## 2024-04-11 DIAGNOSIS — Z79899 Other long term (current) drug therapy: Secondary | ICD-10-CM

## 2024-04-11 DIAGNOSIS — C180 Malignant neoplasm of cecum: Secondary | ICD-10-CM | POA: Diagnosis present

## 2024-04-11 DIAGNOSIS — Z833 Family history of diabetes mellitus: Secondary | ICD-10-CM

## 2024-04-11 DIAGNOSIS — K3532 Acute appendicitis with perforation and localized peritonitis, without abscess: Secondary | ICD-10-CM | POA: Diagnosis present

## 2024-04-11 DIAGNOSIS — E278 Other specified disorders of adrenal gland: Secondary | ICD-10-CM | POA: Diagnosis present

## 2024-04-11 DIAGNOSIS — Z90722 Acquired absence of ovaries, bilateral: Secondary | ICD-10-CM

## 2024-04-11 DIAGNOSIS — G8929 Other chronic pain: Secondary | ICD-10-CM | POA: Diagnosis present

## 2024-04-11 DIAGNOSIS — D72829 Elevated white blood cell count, unspecified: Secondary | ICD-10-CM | POA: Diagnosis present

## 2024-04-11 DIAGNOSIS — N189 Chronic kidney disease, unspecified: Secondary | ICD-10-CM | POA: Diagnosis present

## 2024-04-11 DIAGNOSIS — Z83438 Family history of other disorder of lipoprotein metabolism and other lipidemia: Secondary | ICD-10-CM

## 2024-04-11 DIAGNOSIS — Z9889 Other specified postprocedural states: Secondary | ICD-10-CM

## 2024-04-11 DIAGNOSIS — Z841 Family history of disorders of kidney and ureter: Secondary | ICD-10-CM

## 2024-04-11 DIAGNOSIS — F419 Anxiety disorder, unspecified: Secondary | ICD-10-CM | POA: Diagnosis present

## 2024-04-11 DIAGNOSIS — K219 Gastro-esophageal reflux disease without esophagitis: Secondary | ICD-10-CM | POA: Diagnosis present

## 2024-04-11 DIAGNOSIS — R918 Other nonspecific abnormal finding of lung field: Secondary | ICD-10-CM | POA: Diagnosis present

## 2024-04-11 DIAGNOSIS — Z8249 Family history of ischemic heart disease and other diseases of the circulatory system: Secondary | ICD-10-CM

## 2024-04-11 DIAGNOSIS — K567 Ileus, unspecified: Secondary | ICD-10-CM | POA: Diagnosis not present

## 2024-04-11 DIAGNOSIS — Z90710 Acquired absence of both cervix and uterus: Secondary | ICD-10-CM

## 2024-04-11 DIAGNOSIS — N179 Acute kidney failure, unspecified: Secondary | ICD-10-CM | POA: Diagnosis present

## 2024-04-11 DIAGNOSIS — Z832 Family history of diseases of the blood and blood-forming organs and certain disorders involving the immune mechanism: Secondary | ICD-10-CM

## 2024-04-11 DIAGNOSIS — E739 Lactose intolerance, unspecified: Secondary | ICD-10-CM | POA: Diagnosis present

## 2024-04-11 DIAGNOSIS — E878 Other disorders of electrolyte and fluid balance, not elsewhere classified: Secondary | ICD-10-CM | POA: Diagnosis present

## 2024-04-11 DIAGNOSIS — J45909 Unspecified asthma, uncomplicated: Secondary | ICD-10-CM | POA: Diagnosis present

## 2024-04-11 DIAGNOSIS — Z7951 Long term (current) use of inhaled steroids: Secondary | ICD-10-CM

## 2024-04-11 DIAGNOSIS — G4700 Insomnia, unspecified: Secondary | ICD-10-CM | POA: Diagnosis present

## 2024-04-11 DIAGNOSIS — Z888 Allergy status to other drugs, medicaments and biological substances status: Secondary | ICD-10-CM

## 2024-04-11 DIAGNOSIS — N3000 Acute cystitis without hematuria: Secondary | ICD-10-CM | POA: Diagnosis present

## 2024-04-11 DIAGNOSIS — Z800 Family history of malignant neoplasm of digestive organs: Secondary | ICD-10-CM

## 2024-04-11 DIAGNOSIS — W541XXA Struck by dog, initial encounter: Secondary | ICD-10-CM

## 2024-04-11 DIAGNOSIS — R350 Frequency of micturition: Secondary | ICD-10-CM | POA: Diagnosis present

## 2024-04-11 DIAGNOSIS — D649 Anemia, unspecified: Secondary | ICD-10-CM | POA: Diagnosis present

## 2024-04-11 DIAGNOSIS — C772 Secondary and unspecified malignant neoplasm of intra-abdominal lymph nodes: Secondary | ICD-10-CM | POA: Diagnosis present

## 2024-04-11 DIAGNOSIS — C189 Malignant neoplasm of colon, unspecified: Secondary | ICD-10-CM

## 2024-04-11 DIAGNOSIS — F32A Depression, unspecified: Secondary | ICD-10-CM | POA: Diagnosis present

## 2024-04-11 DIAGNOSIS — M461 Sacroiliitis, not elsewhere classified: Secondary | ICD-10-CM | POA: Diagnosis present

## 2024-04-11 DIAGNOSIS — E669 Obesity, unspecified: Secondary | ICD-10-CM | POA: Diagnosis present

## 2024-04-11 LAB — URINALYSIS, ROUTINE W REFLEX MICROSCOPIC
Glucose, UA: NEGATIVE mg/dL
Hgb urine dipstick: NEGATIVE
Ketones, ur: NEGATIVE mg/dL
Leukocytes,Ua: NEGATIVE
Nitrite: POSITIVE — AB
Protein, ur: 30 mg/dL — AB
Specific Gravity, Urine: >1.046 — ABNORMAL HIGH (ref 1.005–1.030)
pH: 5.5 (ref 5.0–8.0)

## 2024-04-11 LAB — CBC
HCT: 35.1 % — ABNORMAL LOW (ref 36.0–46.0)
Hemoglobin: 11.7 g/dL — ABNORMAL LOW (ref 12.0–15.0)
MCH: 29.6 pg (ref 26.0–34.0)
MCHC: 33.3 g/dL (ref 30.0–36.0)
MCV: 88.9 fL (ref 80.0–100.0)
Platelets: 477 10*3/uL — ABNORMAL HIGH (ref 150–400)
RBC: 3.95 MIL/uL (ref 3.87–5.11)
RDW: 12.5 % (ref 11.5–15.5)
WBC: 27.5 10*3/uL — ABNORMAL HIGH (ref 4.0–10.5)
nRBC: 0 % (ref 0.0–0.2)

## 2024-04-11 LAB — BASIC METABOLIC PANEL WITH GFR
Anion gap: 12 (ref 5–15)
BUN: 12 mg/dL (ref 6–20)
CO2: 25 mmol/L (ref 22–32)
Calcium: 9.9 mg/dL (ref 8.9–10.3)
Chloride: 96 mmol/L — ABNORMAL LOW (ref 98–111)
Creatinine, Ser: 1.51 mg/dL — ABNORMAL HIGH (ref 0.44–1.00)
GFR, Estimated: 41 mL/min — ABNORMAL LOW (ref >60)
Glucose, Bld: 139 mg/dL — ABNORMAL HIGH (ref 70–99)
Potassium: 3.8 mmol/L (ref 3.5–5.1)
Sodium: 133 mmol/L — ABNORMAL LOW (ref 135–145)

## 2024-04-11 LAB — HEPATIC FUNCTION PANEL
ALT: 34 U/L (ref 0–44)
AST: 22 U/L (ref 15–41)
Albumin: 4.1 g/dL (ref 3.5–5.0)
Alkaline Phosphatase: 181 U/L — ABNORMAL HIGH (ref 38–126)
Bilirubin, Direct: 0.2 mg/dL (ref 0.0–0.2)
Indirect Bilirubin: 0.6 mg/dL (ref 0.3–0.9)
Total Bilirubin: 0.8 mg/dL (ref 0.0–1.2)
Total Protein: 7.3 g/dL (ref 6.5–8.1)

## 2024-04-11 LAB — LACTIC ACID, PLASMA: Lactic Acid, Venous: 1 mmol/L (ref 0.5–1.9)

## 2024-04-11 MED ORDER — LOSARTAN POTASSIUM 50 MG PO TABS
100.0000 mg | ORAL_TABLET | Freq: Every day | ORAL | Status: DC
  Administered 2024-04-12 – 2024-04-15 (×2): 100 mg via ORAL
  Filled 2024-04-11 (×3): qty 2
  Filled 2024-04-11: qty 4
  Filled 2024-04-11 (×2): qty 2

## 2024-04-11 MED ORDER — BUPROPION HCL ER (XL) 150 MG PO TB24
300.0000 mg | ORAL_TABLET | Freq: Every day | ORAL | Status: DC
  Administered 2024-04-13 – 2024-04-24 (×11): 300 mg via ORAL
  Filled 2024-04-11 (×6): qty 2
  Filled 2024-04-11: qty 1
  Filled 2024-04-11 (×6): qty 2

## 2024-04-11 MED ORDER — SODIUM CHLORIDE 0.9 % IV SOLN
2.0000 g | Freq: Once | INTRAVENOUS | Status: AC
  Administered 2024-04-11: 2 g via INTRAVENOUS
  Filled 2024-04-11: qty 20

## 2024-04-11 MED ORDER — FAMOTIDINE 20 MG PO TABS
20.0000 mg | ORAL_TABLET | Freq: Two times a day (BID) | ORAL | Status: DC
Start: 2024-04-11 — End: 2024-04-24
  Administered 2024-04-12 – 2024-04-24 (×24): 20 mg via ORAL
  Filled 2024-04-11 (×26): qty 1

## 2024-04-11 MED ORDER — SODIUM CHLORIDE 0.9 % IV BOLUS
1000.0000 mL | Freq: Once | INTRAVENOUS | Status: AC
  Administered 2024-04-11: 1000 mL via INTRAVENOUS

## 2024-04-11 MED ORDER — METRONIDAZOLE 500 MG/100ML IV SOLN
500.0000 mg | Freq: Once | INTRAVENOUS | Status: AC
  Administered 2024-04-11: 500 mg via INTRAVENOUS
  Filled 2024-04-11: qty 100

## 2024-04-11 MED ORDER — CARVEDILOL 25 MG PO TABS
25.0000 mg | ORAL_TABLET | Freq: Two times a day (BID) | ORAL | Status: DC
  Administered 2024-04-12 – 2024-04-24 (×22): 25 mg via ORAL
  Filled 2024-04-11 (×15): qty 1
  Filled 2024-04-11: qty 2
  Filled 2024-04-11 (×8): qty 1

## 2024-04-11 MED ORDER — IOHEXOL 300 MG/ML  SOLN
100.0000 mL | Freq: Once | INTRAMUSCULAR | Status: AC | PRN
  Administered 2024-04-11: 100 mL via INTRAVENOUS

## 2024-04-11 MED ORDER — ALBUTEROL SULFATE (2.5 MG/3ML) 0.083% IN NEBU: 2.5000 mg | INHALATION_SOLUTION | Freq: Four times a day (QID) | RESPIRATORY_TRACT | Status: DC | PRN

## 2024-04-11 MED ORDER — MORPHINE SULFATE (PF) 4 MG/ML IV SOLN
4.0000 mg | Freq: Once | INTRAVENOUS | Status: AC
  Administered 2024-04-11: 4 mg via INTRAVENOUS
  Filled 2024-04-11: qty 1

## 2024-04-11 MED ORDER — FENTANYL CITRATE PF 50 MCG/ML IJ SOSY
25.0000 ug | PREFILLED_SYRINGE | Freq: Once | INTRAMUSCULAR | Status: AC
  Administered 2024-04-11: 25 ug via INTRAVENOUS
  Filled 2024-04-11: qty 1

## 2024-04-11 NOTE — ED Provider Notes (Signed)
 [place] EMERGENCY DEPARTMENT AT [facility] Provider Note   CSN: [id number] Arrival date & time: 04/11/24  1707     History  Chief Complaint  Patient presents with   Abdominal Pain    Erin Bradford is a 53 y.o. female.   Abdominal Pain   53 year old female presents emergency department with complaints of abdominal pain, nausea, vomiting, flank pain.  States that she has been having symptoms for about a week.  States that she was stepped on her abdomen by her dog around 8 days ago which initially worsened her abdominal pain.  Reports pain lying in her lower abdomen.  Does also report feelings of bilateral flank pain as well.  Reports feelings of nausea with emesis with minimal tolerance of p.o. since symptom onset over the past 4 days or so.  Denies any fevers, chills.  Patient also states that she feels like she is constipated; has not had a bowel movement in the past 5 days.  Past medical history significant for CKD, anemia, anxiety, GERD, hypertension, urinary frequency, asthma, chronic low back pain  Home Medications Prior to Admission medications   Medication Sig Start Date End Date Taking? Authorizing Provider  albuterol (PROVENTIL) (2.5 MG/3ML) 0.083% nebulizer solution USE 1 VIAL VIA NEBULIZER EVERY 6 HOURS AS NEEDED FOR WHEEZING OR  SHORTNESS OF BREATH 08/08/23   Donell Beers, FNP  AMITIZA 24 MCG capsule  03/29/19   [provider]  buPROPion (WELLBUTRIN XL) 300 MG 24 hr tablet Take 1 tablet (300 mg total) by mouth daily. 03/05/24   Arfeen, Phillips Grout, MD  carvedilol (COREG) 25 MG tablet TAKE 1 TABLET BY MOUTH TWICE  DAILY 12/06/23   Camnitz, Andree Coss, MD  cetirizine (ZYRTEC) 10 MG tablet TAKE 1 TABLET(10 MG) BY MOUTH AT BEDTIME AS NEEDED FOR ALLERGIES 03/23/23   Paseda, Baird Kay, FNP  famotidine (PEPCID) 20 MG tablet TAKE 1 TABLET(20 MG) BY MOUTH TWICE DAILY 05/15/20   Kallie Locks, FNP  fluticasone (FLONASE) 50 MCG/ACT nasal spray SHAKE  LIQUID AND USE 2 SPRAYS IN EACH NOSTRIL DAILY 03/23/23   Paseda, Baird Kay, FNP  furosemide (LASIX) 20 MG tablet Take 20 mg by mouth 2 (two) times daily.     [provider]  gabapentin (NEURONTIN) 300 MG capsule Take 2 capsules by mouth 3 (three) times daily. 06/15/21   Leontine Locket, MD  Glucose Blood (BLOOD GLUCOSE TEST STRIPS) STRP     [provider]  LOMAIRA 8 MG TABS Take 1 tablet by mouth 2 (two) times daily. Patient not taking: Reported on 03/23/2023 11/05/22   Ronne Binning, PA-C  losartan (COZAAR) 100 MG tablet Take 1 tablet (100 mg total) by mouth daily. 11/02/22   Graciella Freer, PA-C  NARCAN 4 MG/0.1ML LIQD nasal spray kit Place 1 spray into the nose as directed. 02/05/21   [provider]  nitrofurantoin, macrocrystal-monohydrate, (MACROBID) 100 MG capsule Take 1 capsule (100 mg total) by mouth 2 (two) times daily for 7 days. 04/07/24 04/14/24  Delorse Lek, FNP  oxybutynin (DITROPAN-XL) 10 MG 24 hr tablet TAKE 1 TABLET BY MOUTH AT  BEDTIME 04/04/24   Ivonne Andrew, NP  oxyCODONE-acetaminophen (PERCOCET) 10-325 MG tablet Take 1 tablet by mouth every 8 (eight) hours as needed for pain. 11/03/22   Nilda Simmer, MD  phenazopyridine (PYRIDIUM) 200 MG tablet Take 1 tablet (200 mg total) by mouth 3 (three) times daily as needed for up to 7 days for  pain. 04/07/24 04/14/24  Blair, Diane W, FNP  primidone (MYSOLINE) 50 MG tablet TAKE 1 TABLET BY MOUTH EVERY  MORNING AND TAKE 6 TABLETS BY  MOUTH IN THE EVENING 03/20/24   Festus Hubert, Adam R, DO  sertraline (ZOLOFT) 100 MG tablet Take 1 tablet (100 mg total) by mouth daily. 03/05/24 06/03/24  Arfeen, Bronson Canny, MD  spironolactone (ALDACTONE) 100 MG tablet Take 100 mg by mouth 2 (two) times daily. 05/08/21   Shah, Anuj M, MD  SYMBICORT 80-4.5 MCG/ACT inhaler USE 2 INHALATIONS BY MOUTH TWICE DAILY 02/29/24   Paseda, Folashade R, FNP  tiZANidine (ZANAFLEX) 4 MG tablet Take 4 mg by mouth 3 (three) times daily. 04/26/22    Brunson-Ollison, Joyce E, NP  VENTOLIN HFA 108 (90 Base) MCG/ACT inhaler USE 2 INHALATIONS BY MOUTH EVERY 6 HOURS AS NEEDED FOR WHEEZING  OR SHORTNESS OF BREATH 08/08/23   Paseda, Folashade R, FNP  zolpidem (AMBIEN CR) 12.5 MG CR tablet Take 1 tablet (12.5 mg total) by mouth at bedtime as needed for sleep. 03/05/24   Arfeen, Bronson Canny, MD      Allergies    Ace inhibitors    Review of Systems   Review of Systems  Gastrointestinal:  Positive for abdominal pain.  All other systems reviewed and are negative.   Physical Exam Updated Vital Signs BP 136/84   Pulse 76   Temp 98.9 F (37.2 C) (Oral)   Resp 19   LMP 09/17/2017 (Approximate)   SpO2 94%  Physical Exam Vitals and nursing note reviewed.  Constitutional:      General: She is not in acute distress.    Appearance: She is well-developed.  HENT:     Head: Normocephalic and atraumatic.  Eyes:     Conjunctiva/sclera: Conjunctivae normal.  Cardiovascular:     Rate and Rhythm: Normal rate and regular rhythm.     Heart sounds: No murmur heard. Pulmonary:     Effort: Pulmonary effort is normal. No respiratory distress.     Breath sounds: Normal breath sounds.  Abdominal:     Palpations: Abdomen is soft.     Tenderness: There is abdominal tenderness in the right lower quadrant, suprapubic area and left lower quadrant. There is right CVA tenderness and left CVA tenderness.  Musculoskeletal:        General: No swelling.     Cervical back: Neck supple.  Skin:    General: Skin is warm and dry.     Capillary Refill: Capillary refill takes less than 2 seconds.  Neurological:     Mental Status: She is alert.  Psychiatric:        Mood and Affect: Mood normal.     ED Results / Procedures / Treatments   Labs (all labs ordered are listed, but only abnormal results are displayed) Labs Reviewed  URINALYSIS, ROUTINE W REFLEX MICROSCOPIC - Abnormal; Notable for the following components:      Result Value   Specific Gravity, Urine  >1.046 (*)    Bilirubin Urine SMALL (*)    Protein, ur 30 (*)    Nitrite POSITIVE (*)    Bacteria, UA RARE (*)    All other components within normal limits  CBC - Abnormal; Notable for the following components:   WBC 27.5 (*)    Hemoglobin 11.7 (*)    HCT 35.1 (*)    Platelets 477 (*)    All other components within normal limits  BASIC METABOLIC PANEL WITH GFR - Abnormal; Notable for the  following components:   Sodium 133 (*)    Chloride 96 (*)    Glucose, Bld 139 (*)    Creatinine, Ser 1.51 (*)    GFR, Estimated 41 (*)    All other components within normal limits  HEPATIC FUNCTION PANEL - Abnormal; Notable for the following components:   Alkaline Phosphatase 181 (*)    All other components within normal limits  URINE CULTURE  CULTURE, BLOOD (ROUTINE X 2)  CULTURE, BLOOD (ROUTINE X 2)  LACTIC ACID, PLASMA    EKG None  Radiology CT ABDOMEN PELVIS W CONTRAST Result Date: 04/11/2024 CLINICAL DATA:  Abdominal pain.  Constipation. EXAM: CT ABDOMEN AND PELVIS WITH CONTRAST TECHNIQUE: Multidetector CT imaging of the abdomen and pelvis was performed using the standard protocol following bolus administration of intravenous contrast. RADIATION DOSE REDUCTION: This exam was performed according to the departmental dose-optimization program which includes automated exposure control, adjustment of the mA and/or kV according to patient size and/or use of iterative reconstruction technique. CONTRAST:  OMNIPAQUE IOHEXOL 300 MG/ML  SOLN COMPARISON:  11/24/2017. FINDINGS: Lower chest: No acute abnormality. No pleural or pericardial effusions. 3 mm nodule left lung base is unchanged compared to the 2018 study. Hepatobiliary: 1 cm hypodensity left lobe of the liver, likely a cyst. No biliary ductal dilatation. Unremarkable gallbladder. Pancreas: Unremarkable. No pancreatic ductal dilatation or surrounding inflammatory changes. Spleen: Normal in size without focal abnormality. Adrenals/Urinary  Tract: 9 mm right adrenal nodules unchanged compared to 2018. No renal parenchymal abnormalities. No hydronephrosis or nephrolithiasis. Unremarkable urinary bladder. Stomach/Bowel: Dilated appendix with extensive periappendiceal inflammatory changes and F fluid collection consistent with an abscess measuring 5 cm. Mildly dilated loops of small bowel adjacent to the inflammatory process likely representing reactive changes an ileus. No bowel obstruction with normal stool and air in the colon and rectosigmoid. Vascular/Lymphatic: No significant vascular findings are present. No enlarged abdominal or pelvic lymph nodes. Reproductive: Status post hysterectomy. No adnexal masses. Other: No abdominal wall hernia or abnormality. No abdominopelvic ascites. Musculoskeletal: Lumbosacral degenerative changes with L5-S1 disc space narrowing, sclerosis and osteophytes. Grade 1 L4 retrolisthesis. IMPRESSION: 1. Acute appendicitis with periappendiceal abscess. 2. Stable right adrenal nodule. 3. Stable left lower lobe pulmonary nodule. Electronically Signed   By: Layla Maw M.D.   On: 04/11/2024 20:50    Procedures Procedures    Medications Ordered in ED Medications  albuterol (PROVENTIL) (2.5 MG/3ML) 0.083% nebulizer solution 2.5 mg (has no administration in time range)  carvedilol (COREG) tablet 25 mg (has no administration in time range)  buPROPion (WELLBUTRIN XL) 24 hr tablet 300 mg (has no administration in time range)  famotidine (PEPCID) tablet 20 mg (has no administration in time range)  losartan (COZAAR) tablet 100 mg (has no administration in time range)  iohexol (OMNIPAQUE) 300 MG/ML solution 100 mL (100 mLs Intravenous Contrast Given 04/11/24 1806)  cefTRIAXone (ROCEPHIN) 2 g in sodium chloride 0.9 % 100 mL IVPB (0 g Intravenous Stopped 04/11/24 2110)  sodium chloride 0.9 % bolus 1,000 mL (1,000 mLs Intravenous New Bag/Given 04/11/24 2053)  metroNIDAZOLE (FLAGYL) IVPB 500 mg (0 mg Intravenous Stopped  04/11/24 2210)  fentaNYL (SUBLIMAZE) injection 25 mcg (25 mcg Intravenous Given 04/11/24 2127)  morphine (PF) 4 MG/ML injection 4 mg (4 mg Intravenous Given 04/11/24 2328)    ED Course/ Medical Decision Making/ A&P Clinical Course as of 04/11/24 2330  Wed Apr 11, 2024  2127 Consulted general surgery Dr.Metzga who agreed with admission and assume further treatment/care. [CR]  Clinical Course User Index [CR] Peter Garter, PA                                 Medical Decision Making Amount and/or Complexity of Data Reviewed Labs: ordered. Radiology: ordered.  Risk Prescription drug management. Decision regarding hospitalization.   This patient presents to the ED for concern of abdominal/flank pain, this involves an extensive number of treatment options, and is a complaint that carries with it a high risk of complications and morbidity.  The differential diagnosis includes probably Vit[place] nephrolithiasis, cholecystitis, CBD pathology, SBO/LBO, volvulus, diverticulitis, appendicitis, gastritis, PUD, pancreatitis, mesenteric ischemia, other   Co morbidities that complicate the patient evaluation  See HPI   Additional history obtained:  Additional history obtained from EMR External records from outside source obtained and reviewed including hospital records   Lab Tests:  I Ordered, and personally interpreted labs.  The pertinent results include: Leukocytosis of 27.5.  Anemia with a hemoglobin of 11.7.  Thrombocytosis of 477.  Hyponatremia, hypochloremia of 133 and 96 respectively.  Worsening renal function from yesterday with near doubling of creatinine 1.53, GFR 41, BUN of 12.  Lactic acid, blood cultures, hepatic function pending.   Imaging Studies ordered:  I ordered imaging studies including CT abdomen pelvis I independently visualized and interpreted imaging which showed acute appendicitis with p.o. appendiceal abscess.  Right adrenal nodule.  Left lower pulmonary  nodule. I agree with the radiologist interpretation  Cardiac Monitoring: / EKG:  The patient was maintained on a cardiac monitor.  I personally viewed and interpreted the cardiac monitored which showed an underlying rhythm of: Sinus rhythm   Consultations Obtained:  See ED course  Problem List / ED Course / Critical interventions / Medication management  Appendicitis with abscess, UTI, AKI I ordered medication including Flagyl, Rocephin, 1 L normal saline, fentanyl   Reevaluation of the patient after these medicines showed that the patient improved I have reviewed the patients home medicines and have made adjustments as needed   Social Determinants of Health:  Denies tobacco, licit drug use.   Test / Admission - Considered:  Appendicitis with abscess, UTI, AKI Vitals signs within normal range and stable throughout visit. Laboratory/imaging studies significant for: See above 53 year old female presents emergency department with complaints of abdominal pain, nausea, vomiting, flank pain.  States that she has been having symptoms for about a week.  States that she was stepped on her abdomen by her dog around 8 days ago which initially worsened her abdominal pain.  Reports pain lying in her lower abdomen.  Does also report feelings of bilateral flank pain as well.  Reports feelings of nausea with emesis with minimal tolerance of p.o. since symptom onset over the past 4 days or so.  Denies any fevers, chills.  Patient also states that she feels like she is constipated; has not had a bowel movement in the past 5 days. On exam, lower abdominal tenderness appreciated with most tenderness in the right lower quadrant.  Bilateral CVA tenderness appreciated.  Laboratory studies concerning for UTI with UA with bacteria present, WBCs present, nitrate positive.  Metabolic panel showed near doubling of creatinine from labs performed yesterday.  Significant leukocytosis of 27.5 but patient did meet  SIRS criteria so sepsis protocol was not performed.  CT imaging obtained showed evidence of appendicitis with abscess.  Broad-spectrum antibiotics begun.  Consulted general surgery regarding the patient who agreed with admission.  Treatment plan discussed with patient and she acknowledged understanding was agreeable to said plan.  Patient stable upon admission.         Final Clinical Impression(s) / ED Diagnoses Final diagnoses:  Acute appendicitis with localized peritonitis and abscess, without gangrene or perforation  AKI (acute kidney injury) (HCC)  Acute cystitis without hematuria    Rx / DC Orders ED Discharge Orders     None          [place], [place] 04/11/24 2330    Onetha Bile, MD 04/12/24 1459

## 2024-04-11 NOTE — ED Notes (Signed)
 ED Provider at bedside.

## 2024-04-11 NOTE — ED Triage Notes (Addendum)
 Constipated 5 days-normally every day. Emesis-8 days. Painful urination-helped with AZO. Endorses bilateral flank pain. Afebrile.  [facility] drew labs yesterday-LWBS- WBC 23.4 Alk Phos 175.  HX hysterectomy- no other abd surg.

## 2024-04-12 ENCOUNTER — Other Ambulatory Visit: Payer: Self-pay

## 2024-04-12 ENCOUNTER — Encounter (HOSPITAL_COMMUNITY): Payer: Self-pay

## 2024-04-12 ENCOUNTER — Encounter (HOSPITAL_COMMUNITY): Admission: EM | Disposition: A | Discharge status: Home Health | Payer: Self-pay | Source: Home / Self Care

## 2024-04-12 ENCOUNTER — Inpatient Hospital Stay (HOSPITAL_COMMUNITY): Admitting: Anesthesiology

## 2024-04-12 DIAGNOSIS — K3533 Acute appendicitis with perforation and localized peritonitis, with abscess: Secondary | ICD-10-CM | POA: Diagnosis present

## 2024-04-12 DIAGNOSIS — K219 Gastro-esophageal reflux disease without esophagitis: Secondary | ICD-10-CM | POA: Diagnosis present

## 2024-04-12 DIAGNOSIS — N3000 Acute cystitis without hematuria: Secondary | ICD-10-CM | POA: Diagnosis present

## 2024-04-12 DIAGNOSIS — Z6841 Body Mass Index (BMI) 40.0 and over, adult: Secondary | ICD-10-CM | POA: Diagnosis not present

## 2024-04-12 DIAGNOSIS — K3532 Acute appendicitis with perforation and localized peritonitis, without abscess: Secondary | ICD-10-CM | POA: Diagnosis not present

## 2024-04-12 DIAGNOSIS — J45909 Unspecified asthma, uncomplicated: Secondary | ICD-10-CM

## 2024-04-12 DIAGNOSIS — M1990 Unspecified osteoarthritis, unspecified site: Secondary | ICD-10-CM | POA: Diagnosis present

## 2024-04-12 DIAGNOSIS — W541XXA Struck by dog, initial encounter: Secondary | ICD-10-CM | POA: Diagnosis not present

## 2024-04-12 DIAGNOSIS — E739 Lactose intolerance, unspecified: Secondary | ICD-10-CM | POA: Diagnosis present

## 2024-04-12 DIAGNOSIS — E669 Obesity, unspecified: Secondary | ICD-10-CM | POA: Diagnosis present

## 2024-04-12 DIAGNOSIS — F418 Other specified anxiety disorders: Secondary | ICD-10-CM | POA: Diagnosis not present

## 2024-04-12 DIAGNOSIS — N179 Acute kidney failure, unspecified: Secondary | ICD-10-CM | POA: Diagnosis present

## 2024-04-12 DIAGNOSIS — K567 Ileus, unspecified: Secondary | ICD-10-CM | POA: Diagnosis not present

## 2024-04-12 DIAGNOSIS — F419 Anxiety disorder, unspecified: Secondary | ICD-10-CM | POA: Diagnosis present

## 2024-04-12 DIAGNOSIS — N189 Chronic kidney disease, unspecified: Secondary | ICD-10-CM | POA: Diagnosis present

## 2024-04-12 DIAGNOSIS — M461 Sacroiliitis, not elsewhere classified: Secondary | ICD-10-CM | POA: Diagnosis present

## 2024-04-12 DIAGNOSIS — G8929 Other chronic pain: Secondary | ICD-10-CM | POA: Diagnosis present

## 2024-04-12 DIAGNOSIS — I10 Essential (primary) hypertension: Secondary | ICD-10-CM | POA: Diagnosis not present

## 2024-04-12 DIAGNOSIS — F32A Depression, unspecified: Secondary | ICD-10-CM | POA: Diagnosis present

## 2024-04-12 DIAGNOSIS — M545 Low back pain, unspecified: Secondary | ICD-10-CM | POA: Diagnosis present

## 2024-04-12 DIAGNOSIS — E871 Hypo-osmolality and hyponatremia: Secondary | ICD-10-CM | POA: Diagnosis present

## 2024-04-12 DIAGNOSIS — C772 Secondary and unspecified malignant neoplasm of intra-abdominal lymph nodes: Secondary | ICD-10-CM | POA: Diagnosis present

## 2024-04-12 DIAGNOSIS — E278 Other specified disorders of adrenal gland: Secondary | ICD-10-CM | POA: Diagnosis present

## 2024-04-12 DIAGNOSIS — G4700 Insomnia, unspecified: Secondary | ICD-10-CM | POA: Diagnosis present

## 2024-04-12 DIAGNOSIS — I129 Hypertensive chronic kidney disease with stage 1 through stage 4 chronic kidney disease, or unspecified chronic kidney disease: Secondary | ICD-10-CM | POA: Diagnosis present

## 2024-04-12 DIAGNOSIS — E876 Hypokalemia: Secondary | ICD-10-CM | POA: Diagnosis not present

## 2024-04-12 DIAGNOSIS — C180 Malignant neoplasm of cecum: Secondary | ICD-10-CM | POA: Diagnosis present

## 2024-04-12 DIAGNOSIS — D649 Anemia, unspecified: Secondary | ICD-10-CM | POA: Diagnosis present

## 2024-04-12 HISTORY — PX: LAPAROSCOPIC APPENDECTOMY: SHX408

## 2024-04-12 LAB — BASIC METABOLIC PANEL WITH GFR
Anion gap: 11 (ref 5–15)
BUN: 9 mg/dL (ref 6–20)
CO2: 26 mmol/L (ref 22–32)
Calcium: 9.1 mg/dL (ref 8.9–10.3)
Chloride: 99 mmol/L (ref 98–111)
Creatinine, Ser: 0.87 mg/dL (ref 0.44–1.00)
GFR, Estimated: >60 mL/min (ref >60)
Glucose, Bld: 113 mg/dL — ABNORMAL HIGH (ref 70–99)
Potassium: 3.3 mmol/L — ABNORMAL LOW (ref 3.5–5.1)
Sodium: 136 mmol/L (ref 135–145)

## 2024-04-12 LAB — CBC
HCT: 33.7 % — ABNORMAL LOW (ref 36.0–46.0)
Hemoglobin: 11.3 g/dL — ABNORMAL LOW (ref 12.0–15.0)
MCH: 29.4 pg (ref 26.0–34.0)
MCHC: 33.5 g/dL (ref 30.0–36.0)
MCV: 87.8 fL (ref 80.0–100.0)
Platelets: 476 10*3/uL — ABNORMAL HIGH (ref 150–400)
RBC: 3.84 MIL/uL — ABNORMAL LOW (ref 3.87–5.11)
RDW: 12.7 % (ref 11.5–15.5)
WBC: 25.8 10*3/uL — ABNORMAL HIGH (ref 4.0–10.5)
nRBC: 0 % (ref 0.0–0.2)

## 2024-04-12 LAB — TYPE AND SCREEN
ABO/RH(D): O POS
Antibody Screen: NEGATIVE

## 2024-04-12 SURGERY — APPENDECTOMY, LAPAROSCOPIC
Anesthesia: General

## 2024-04-12 MED ORDER — HYDROMORPHONE HCL 1 MG/ML IJ SOLN
1.0000 mg | INTRAMUSCULAR | Status: DC | PRN
  Administered 2024-04-12 (×2): 1 mg via INTRAVENOUS
  Filled 2024-04-12 (×2): qty 1

## 2024-04-12 MED ORDER — METRONIDAZOLE 500 MG/100ML IV SOLN
500.0000 mg | Freq: Two times a day (BID) | INTRAVENOUS | Status: AC
  Administered 2024-04-12 – 2024-04-18 (×11): 500 mg via INTRAVENOUS
  Filled 2024-04-12 (×12): qty 100

## 2024-04-12 MED ORDER — BUPIVACAINE-EPINEPHRINE (PF) 0.25% -1:200000 IJ SOLN
INTRAMUSCULAR | Status: AC
  Filled 2024-04-12: qty 30

## 2024-04-12 MED ORDER — LACTATED RINGERS IV SOLN: INTRAVENOUS | Status: AC

## 2024-04-12 MED ORDER — FENTANYL CITRATE (PF) 100 MCG/2ML IJ SOLN
INTRAMUSCULAR | Status: AC
  Filled 2024-04-12: qty 2

## 2024-04-12 MED ORDER — ONDANSETRON HCL 4 MG/2ML IJ SOLN
4.0000 mg | Freq: Four times a day (QID) | INTRAMUSCULAR | Status: DC | PRN
  Administered 2024-04-13 – 2024-04-20 (×12): 4 mg via INTRAVENOUS
  Filled 2024-04-12 (×14): qty 2

## 2024-04-12 MED ORDER — ONDANSETRON HCL 4 MG/2ML IJ SOLN
INTRAMUSCULAR | Status: AC
  Filled 2024-04-12: qty 2

## 2024-04-12 MED ORDER — ONDANSETRON HCL 4 MG/2ML IJ SOLN
4.0000 mg | Freq: Once | INTRAMUSCULAR | Status: AC
  Administered 2024-04-12: 4 mg via INTRAVENOUS
  Filled 2024-04-12: qty 2

## 2024-04-12 MED ORDER — SUCCINYLCHOLINE CHLORIDE 200 MG/10ML IV SOSY
PREFILLED_SYRINGE | INTRAVENOUS | Status: DC | PRN
  Administered 2024-04-12: 200 mg via INTRAVENOUS

## 2024-04-12 MED ORDER — METHOCARBAMOL 500 MG PO TABS
500.0000 mg | ORAL_TABLET | Freq: Three times a day (TID) | ORAL | Status: DC | PRN
  Administered 2024-04-13 – 2024-04-15 (×2): 500 mg via ORAL
  Filled 2024-04-12 (×2): qty 1

## 2024-04-12 MED ORDER — LACTATED RINGERS IV SOLN
INTRAVENOUS | Status: DC
Start: 2024-04-12 — End: 2024-04-12

## 2024-04-12 MED ORDER — ACETAMINOPHEN 500 MG PO TABS
1000.0000 mg | ORAL_TABLET | Freq: Four times a day (QID) | ORAL | Status: DC
  Administered 2024-04-13 – 2024-04-24 (×40): 1000 mg via ORAL
  Filled 2024-04-12 (×44): qty 2

## 2024-04-12 MED ORDER — LIDOCAINE 2% (20 MG/ML) 5 ML SYRINGE
INTRAMUSCULAR | Status: AC
  Filled 2024-04-12: qty 5

## 2024-04-12 MED ORDER — LABETALOL HCL 5 MG/ML IV SOLN
INTRAVENOUS | Status: DC | PRN
  Administered 2024-04-12: 5 mg via INTRAVENOUS
  Administered 2024-04-12: 10 mg via INTRAVENOUS

## 2024-04-12 MED ORDER — ACETAMINOPHEN 325 MG PO TABS: 650.0000 mg | ORAL_TABLET | Freq: Four times a day (QID) | ORAL | Status: DC | PRN

## 2024-04-12 MED ORDER — DIPHENHYDRAMINE HCL 50 MG/ML IJ SOLN
INTRAMUSCULAR | Status: DC | PRN
  Administered 2024-04-12: 12.5 mg via INTRAVENOUS

## 2024-04-12 MED ORDER — ENOXAPARIN SODIUM 40 MG/0.4ML IJ SOSY
40.0000 mg | PREFILLED_SYRINGE | INTRAMUSCULAR | Status: AC
  Administered 2024-04-13 – 2024-04-22 (×10): 40 mg via SUBCUTANEOUS
  Filled 2024-04-12 (×10): qty 0.4

## 2024-04-12 MED ORDER — ALBUMIN HUMAN 5 % IV SOLN: INTRAVENOUS | Status: DC | PRN

## 2024-04-12 MED ORDER — MELATONIN 3 MG PO TABS
3.0000 mg | ORAL_TABLET | Freq: Every evening | ORAL | Status: DC | PRN
  Administered 2024-04-15 – 2024-04-16 (×2): 3 mg via ORAL
  Filled 2024-04-12 (×2): qty 1

## 2024-04-12 MED ORDER — BUPIVACAINE-EPINEPHRINE 0.25% -1:200000 IJ SOLN
INTRAMUSCULAR | Status: DC | PRN
  Administered 2024-04-12: 10 mL

## 2024-04-12 MED ORDER — DEXAMETHASONE SODIUM PHOSPHATE 10 MG/ML IJ SOLN
INTRAMUSCULAR | Status: AC
  Filled 2024-04-12: qty 1

## 2024-04-12 MED ORDER — OXYCODONE HCL 5 MG/5ML PO SOLN
ORAL | Status: AC
Start: 2024-04-12 — End: 2024-04-13
  Filled 2024-04-12: qty 5

## 2024-04-12 MED ORDER — DROPERIDOL 2.5 MG/ML IJ SOLN: 0.6250 mg | Freq: Once | INTRAMUSCULAR | Status: DC | PRN

## 2024-04-12 MED ORDER — DEXMEDETOMIDINE HCL IN NACL 80 MCG/20ML IV SOLN
INTRAVENOUS | Status: DC | PRN
  Administered 2024-04-12 (×2): 4 ug via INTRAVENOUS

## 2024-04-12 MED ORDER — ONDANSETRON 4 MG PO TBDP
4.0000 mg | ORAL_TABLET | Freq: Four times a day (QID) | ORAL | Status: DC | PRN
  Administered 2024-04-15 – 2024-04-17 (×2): 4 mg via ORAL
  Filled 2024-04-12 (×2): qty 1

## 2024-04-12 MED ORDER — DEXAMETHASONE SODIUM PHOSPHATE 10 MG/ML IJ SOLN
INTRAMUSCULAR | Status: DC | PRN
  Administered 2024-04-12: 10 mg via INTRAVENOUS

## 2024-04-12 MED ORDER — SODIUM CHLORIDE 0.9 % IR SOLN
Status: DC | PRN
  Administered 2024-04-12: 1000 mL

## 2024-04-12 MED ORDER — ROCURONIUM BROMIDE 10 MG/ML (PF) SYRINGE
PREFILLED_SYRINGE | INTRAVENOUS | Status: AC
  Filled 2024-04-12: qty 10

## 2024-04-12 MED ORDER — PROPOFOL 1000 MG/100ML IV EMUL
INTRAVENOUS | Status: AC
  Filled 2024-04-12: qty 200

## 2024-04-12 MED ORDER — DIPHENHYDRAMINE HCL 25 MG PO CAPS: 25.0000 mg | ORAL_CAPSULE | Freq: Four times a day (QID) | ORAL | Status: DC | PRN

## 2024-04-12 MED ORDER — HYDROMORPHONE HCL 1 MG/ML IJ SOLN
INTRAMUSCULAR | Status: AC
Start: 2024-04-12 — End: ?
  Filled 2024-04-12: qty 0.5

## 2024-04-12 MED ORDER — DIPHENHYDRAMINE HCL 50 MG/ML IJ SOLN: 25.0000 mg | Freq: Four times a day (QID) | INTRAMUSCULAR | Status: DC | PRN

## 2024-04-12 MED ORDER — FENTANYL CITRATE (PF) 100 MCG/2ML IJ SOLN
25.0000 ug | INTRAMUSCULAR | Status: DC | PRN
  Administered 2024-04-12 (×2): 50 ug via INTRAVENOUS
  Administered 2024-04-12 (×2): 25 ug via INTRAVENOUS

## 2024-04-12 MED ORDER — HYDROMORPHONE HCL 1 MG/ML IJ SOLN
0.5000 mg | INTRAMUSCULAR | Status: DC | PRN
  Administered 2024-04-12: .5 mg via INTRAVENOUS
  Administered 2024-04-12: 1 mg via INTRAVENOUS
  Administered 2024-04-12: .5 mg via INTRAVENOUS
  Filled 2024-04-12: qty 1

## 2024-04-12 MED ORDER — ACETAMINOPHEN 500 MG PO TABS
1000.0000 mg | ORAL_TABLET | Freq: Four times a day (QID) | ORAL | Status: DC
  Administered 2024-04-12: 1000 mg via ORAL
  Filled 2024-04-12 (×2): qty 2

## 2024-04-12 MED ORDER — CHLORHEXIDINE GLUCONATE 0.12 % MT SOLN: 15.0000 mL | Freq: Once | OROMUCOSAL | Status: DC

## 2024-04-12 MED ORDER — MIDAZOLAM HCL 2 MG/2ML IJ SOLN
INTRAMUSCULAR | Status: AC
  Filled 2024-04-12: qty 2

## 2024-04-12 MED ORDER — FENTANYL CITRATE (PF) 250 MCG/5ML IJ SOLN
INTRAMUSCULAR | Status: DC | PRN
  Administered 2024-04-12: 50 ug via INTRAVENOUS
  Administered 2024-04-12: 100 ug via INTRAVENOUS

## 2024-04-12 MED ORDER — FENTANYL CITRATE (PF) 250 MCG/5ML IJ SOLN
INTRAMUSCULAR | Status: AC
  Filled 2024-04-12: qty 5

## 2024-04-12 MED ORDER — PROPOFOL 10 MG/ML IV BOLUS
INTRAVENOUS | Status: DC | PRN
  Administered 2024-04-12: 130 ug/kg/min via INTRAVENOUS
  Administered 2024-04-12: 200 mg via INTRAVENOUS

## 2024-04-12 MED ORDER — SODIUM CHLORIDE 0.9 % IV SOLN
2.0000 g | INTRAVENOUS | Status: DC
  Administered 2024-04-12: 2 g via INTRAVENOUS
  Filled 2024-04-12: qty 20

## 2024-04-12 MED ORDER — METHOCARBAMOL 1000 MG/10ML IJ SOLN
500.0000 mg | Freq: Three times a day (TID) | INTRAMUSCULAR | Status: DC | PRN
  Administered 2024-04-12 – 2024-04-14 (×4): 500 mg via INTRAVENOUS
  Filled 2024-04-12 (×6): qty 10

## 2024-04-12 MED ORDER — ONDANSETRON HCL 4 MG/2ML IJ SOLN
INTRAMUSCULAR | Status: DC | PRN
  Administered 2024-04-12: 4 mg via INTRAVENOUS

## 2024-04-12 MED ORDER — LIDOCAINE 2% (20 MG/ML) 5 ML SYRINGE
INTRAMUSCULAR | Status: DC | PRN
  Administered 2024-04-12: 100 mg via INTRAVENOUS

## 2024-04-12 MED ORDER — MIDAZOLAM HCL 2 MG/2ML IJ SOLN
INTRAMUSCULAR | Status: DC | PRN
  Administered 2024-04-12: 2 mg via INTRAVENOUS

## 2024-04-12 MED ORDER — HYDROMORPHONE HCL 1 MG/ML IJ SOLN
0.5000 mg | INTRAMUSCULAR | Status: DC | PRN
  Administered 2024-04-12 – 2024-04-13 (×3): 0.5 mg via INTRAVENOUS
  Filled 2024-04-12 (×3): qty 0.5

## 2024-04-12 MED ORDER — ROCURONIUM BROMIDE 10 MG/ML (PF) SYRINGE
PREFILLED_SYRINGE | INTRAVENOUS | Status: DC | PRN
Start: 2024-04-12 — End: 2024-04-12
  Administered 2024-04-12: 10 mg via INTRAVENOUS
  Administered 2024-04-12: 20 mg via INTRAVENOUS
  Administered 2024-04-12 (×2): 10 mg via INTRAVENOUS
  Administered 2024-04-12: 60 mg via INTRAVENOUS

## 2024-04-12 MED ORDER — OXYCODONE HCL 5 MG PO TABS
5.0000 mg | ORAL_TABLET | ORAL | Status: DC | PRN
  Administered 2024-04-13: 10 mg via ORAL
  Administered 2024-04-13: 5 mg via ORAL
  Administered 2024-04-14 – 2024-04-17 (×4): 10 mg via ORAL
  Filled 2024-04-12 (×9): qty 2

## 2024-04-12 MED ORDER — OXYCODONE HCL 5 MG PO TABS: 5.0000 mg | ORAL_TABLET | Freq: Once | ORAL | Status: AC | PRN

## 2024-04-12 MED ORDER — 0.9 % SODIUM CHLORIDE (POUR BTL) OPTIME
TOPICAL | Status: DC | PRN
  Administered 2024-04-12: 1000 mL

## 2024-04-12 MED ORDER — LACTATED RINGERS IV SOLN: INTRAVENOUS | Status: DC

## 2024-04-12 MED ORDER — ORAL CARE MOUTH RINSE: 15.0000 mL | Freq: Once | OROMUCOSAL | Status: DC

## 2024-04-12 MED ORDER — OXYCODONE HCL 5 MG/5ML PO SOLN
5.0000 mg | Freq: Once | ORAL | Status: AC | PRN
  Administered 2024-04-12: 5 mg via ORAL

## 2024-04-12 MED ORDER — PROPOFOL 10 MG/ML IV BOLUS
INTRAVENOUS | Status: AC
  Filled 2024-04-12: qty 20

## 2024-04-12 MED ORDER — SUGAMMADEX SODIUM 200 MG/2ML IV SOLN
INTRAVENOUS | Status: DC | PRN
  Administered 2024-04-12: 300 mg via INTRAVENOUS

## 2024-04-12 MED ORDER — ENOXAPARIN SODIUM 40 MG/0.4ML IJ SOSY: 40.0000 mg | PREFILLED_SYRINGE | INTRAMUSCULAR | Status: DC

## 2024-04-12 MED ORDER — METRONIDAZOLE 500 MG/100ML IV SOLN
500.0000 mg | Freq: Two times a day (BID) | INTRAVENOUS | Status: DC
  Administered 2024-04-12: 500 mg via INTRAVENOUS
  Filled 2024-04-12 (×2): qty 100

## 2024-04-12 MED ORDER — PHENYLEPHRINE HCL-NACL 20-0.9 MG/250ML-% IV SOLN
INTRAVENOUS | Status: AC
  Filled 2024-04-12: qty 250

## 2024-04-12 MED ORDER — PHENYLEPHRINE HCL-NACL 20-0.9 MG/250ML-% IV SOLN
INTRAVENOUS | Status: DC | PRN
  Administered 2024-04-12: 35 ug/min via INTRAVENOUS

## 2024-04-12 MED ORDER — SODIUM CHLORIDE 0.9 % IV SOLN
2.0000 g | INTRAVENOUS | Status: AC
  Administered 2024-04-13 – 2024-04-17 (×5): 2 g via INTRAVENOUS
  Filled 2024-04-12 (×5): qty 20

## 2024-04-12 MED ORDER — ACETAMINOPHEN 650 MG RE SUPP: 650.0000 mg | Freq: Four times a day (QID) | RECTAL | Status: DC | PRN

## 2024-04-12 MED ORDER — DIPHENHYDRAMINE HCL 50 MG/ML IJ SOLN
INTRAMUSCULAR | Status: AC
  Filled 2024-04-12: qty 1

## 2024-04-12 MED ORDER — CHLORHEXIDINE GLUCONATE 0.12 % MT SOLN
OROMUCOSAL | Status: AC
  Filled 2024-04-12: qty 15

## 2024-04-12 SURGICAL SUPPLY — 65 items
APPLIER CLIP ROT 10 11.4 M/L (STAPLE) IMPLANT
BAG COUNTER SPONGE SURGICOUNT (BAG) ×2 IMPLANT
BLADE CLIPPER SURG (BLADE) IMPLANT
CANISTER SUCT 3000ML PPV (MISCELLANEOUS) ×2 IMPLANT
CHLORAPREP W/TINT 26 (MISCELLANEOUS) ×2 IMPLANT
CLIP APPLIE ROT 10 11.4 M/L (STAPLE) IMPLANT
COVER SURGICAL LIGHT HANDLE (MISCELLANEOUS) ×2 IMPLANT
CUTTER FLEX LINEAR 45M (STAPLE) ×2 IMPLANT
DERMABOND ADVANCED .7 DNX12 (GAUZE/BANDAGES/DRESSINGS) ×2 IMPLANT
DRAIN CHANNEL 19F RND (DRAIN) IMPLANT
DRSG TEGADERM 2-3/8X2-3/4 SM (GAUZE/BANDAGES/DRESSINGS) IMPLANT
ELECT REM PT RETURN 9FT ADLT (ELECTROSURGICAL) ×1 IMPLANT
ELECTRODE REM PT RTRN 9FT ADLT (ELECTROSURGICAL) ×2 IMPLANT
ENDOLOOP SUT PDS II 0 18 (SUTURE) IMPLANT
EVACUATOR SILICONE 100CC (DRAIN) IMPLANT
GLOVE BIO SURGEON STRL SZ7.5 (GLOVE) ×2 IMPLANT
GOWN STRL REUS W/ TWL LRG LVL3 (GOWN DISPOSABLE) ×6 IMPLANT
HANDLE SUCTION POOLE (INSTRUMENTS) IMPLANT
IRRIG SUCT STRYKERFLOW 2 WTIP (MISCELLANEOUS) ×1 IMPLANT
IRRIGATION SUCT STRKRFLW 2 WTP (MISCELLANEOUS) ×2 IMPLANT
KIT BASIN OR (CUSTOM PROCEDURE TRAY) ×2 IMPLANT
KIT TURNOVER KIT B (KITS) ×2 IMPLANT
NS IRRIG 1000ML POUR BTL (IV SOLUTION) ×2 IMPLANT
PAD ARMBOARD POSITIONER FOAM (MISCELLANEOUS) ×4 IMPLANT
RELOAD 45 THICK GREEN (ENDOMECHANICALS) IMPLANT
RELOAD PROXIMATE 75MM BLUE (ENDOMECHANICALS) ×2 IMPLANT
RELOAD STAPLE 45 3.5 BLU ETS (ENDOMECHANICALS) IMPLANT
RELOAD STAPLE 45 GRN THCK ETS (ENDOMECHANICALS) IMPLANT
RELOAD STAPLE 60 BLK VRY/THCK (STAPLE) IMPLANT
RELOAD STAPLE 75 3.8 BLU REG (ENDOMECHANICALS) IMPLANT
RELOAD STAPLE TA45 3.5 REG BLU (ENDOMECHANICALS) IMPLANT
RELOAD STAPLER 60MM BLK (STAPLE) ×4 IMPLANT
RETRACTOR WND ALEXIS 25 LRG (MISCELLANEOUS) IMPLANT
RETRACTOR WOUND ALXS 25CM LRG (MISCELLANEOUS) ×1 IMPLANT
RTRCTR WOUND ALEXIS 25CM LRG (MISCELLANEOUS) ×1 IMPLANT
SCISSORS LAP 5X35 DISP (ENDOMECHANICALS) IMPLANT
SET TUBE SMOKE EVAC HIGH FLOW (TUBING) ×2 IMPLANT
SHEARS HARMONIC 36 ACE (MISCELLANEOUS) IMPLANT
SHEARS HARMONIC ACE PLUS 36CM (ENDOMECHANICALS) ×2 IMPLANT
SLEEVE Z-THREAD 5X100MM (TROCAR) IMPLANT
SPECIMEN JAR SMALL (MISCELLANEOUS) ×2 IMPLANT
STAPLER GUN LINEAR PROX 60 (STAPLE) IMPLANT
STAPLER POWER ECHELON 60 WIDE (STAPLE) IMPLANT
STAPLER PROXIMATE 75MM BLUE (STAPLE) IMPLANT
STAPLER RELOAD 60MM BLK (STAPLE) ×4 IMPLANT
SUCTION POOLE HANDLE (INSTRUMENTS) ×1 IMPLANT
SUT ETHILON 2 0 FS 18 (SUTURE) IMPLANT
SUT MNCRL AB 4-0 PS2 18 (SUTURE) ×2 IMPLANT
SUT PDS AB 1 TP1 96 (SUTURE) IMPLANT
SUT SILK 2 0 SH CR/8 (SUTURE) IMPLANT
SUT SILK 2 0 TIES 10X30 (SUTURE) IMPLANT
SUT SILK 3 0 SH CR/8 (SUTURE) IMPLANT
SUT SILK 3 0 TIES 10X30 (SUTURE) IMPLANT
SYS BAG RETRIEVAL 10MM (BASKET) ×1 IMPLANT
SYSTEM BAG RETRIEVAL 10MM (BASKET) ×2 IMPLANT
TOWEL GREEN STERILE (TOWEL DISPOSABLE) ×2 IMPLANT
TOWEL GREEN STERILE FF (TOWEL DISPOSABLE) ×2 IMPLANT
TRAY FOLEY W/BAG SLVR 16FR ST (SET/KITS/TRAYS/PACK) ×2 IMPLANT
TRAY LAPAROSCOPIC MC (CUSTOM PROCEDURE TRAY) ×2 IMPLANT
TROCAR BALLN 12MMX100 BLUNT (TROCAR) ×2 IMPLANT
TROCAR Z-THREAD OPTICAL 5X100M (TROCAR) ×4 IMPLANT
TUBE CONNECTING 12X1/4 (SUCTIONS) IMPLANT
WARMER LAPAROSCOPE (MISCELLANEOUS) ×2 IMPLANT
WATER STERILE IRR 1000ML POUR (IV SOLUTION) ×2 IMPLANT
YANKAUER SUCT BULB TIP NO VENT (SUCTIONS) IMPLANT

## 2024-04-12 NOTE — Transfer of Care (Signed)
 Immediate Anesthesia Transfer of Care Note  Patient: Erin Bradford  Procedure(s) Performed: APPENDECTOMY, LAPAROSCOPIC  Patient Location: PACU  Anesthesia Type:MAC  Level of Consciousness: awake and sedated  Airway & Oxygen Therapy: Patient Spontanous Breathing and Patient connected to face mask oxygen  Post-op Assessment: Report given to RN and Post -op Vital signs reviewed and stable  Post vital signs: Reviewed and stable  Last Vitals:  Vitals Value Taken Time  BP 137/109 04/12/24 1815  Temp    Pulse 86 04/12/24 1816  Resp 22 04/12/24 1816  SpO2 100 % 04/12/24 1816  Vitals shown include unfiled device data.  Last Pain:  Vitals:   04/12/24 1345  TempSrc: Oral  PainSc:          Complications: No notable events documented.

## 2024-04-12 NOTE — H&P (Addendum)
 Erin Bradford 09/12/1971  [id number].    Requesting MD: Sherian Maroon, PA Chief Complaint/Reason for Consult: Perforated appendicitis  HPI: Erin Bradford is a 53 y.o. w/ a hx of HTN, asthma, GERD, and obesity who presented to the ED with 8 day hx of generalized abdominal pain with radiation to b/l flanks. She was seen via telemedicine by an urgent care earlier in the week and prescribed macrobid. When the symptoms persisted, she decided to present to the hospital.  Reports associated fever, chills, decreased appetite, dysuria, nausea, vomiting and constipation with no flatus or BM over the last 6 days.  Labs were notable for a WBC of 25.8. CT shows signs of perforated appendicitis w/ an associated abscess. She is AF and HDS.   Past Medical History: CKD, hypertension Prior Abdominal Surgeries: C-section and hysterectomy Blood Thinners: None Last Colonoscopy: Never Allergies: ACE inhibitors  Tobacco Use: none Alcohol Use: none Substance use: none  Employment: student Lives at home with her daughter.   ROS: ROS As above, see hpi  Family History  Problem Relation Age of Onset   Diabetes Mother    Hypertension Mother    Hyperlipidemia Mother    Sarcoidosis Mother    Kidney disease Father    Atrial fibrillation Sister    Kidney disease Maternal Grandmother    Pancreatic cancer Maternal Grandmother    Diabetes Maternal Grandmother    Hypertension Maternal Grandmother    Cancer Maternal Grandfather    Colon cancer Neg Hx    Colon polyps Neg Hx    Esophageal cancer Neg Hx    Stomach cancer Neg Hx    Rectal cancer Neg Hx     Past Medical History:  Diagnosis Date   Abnormal laboratory test    Acid reflux    Allergy    Anemia    Anxiety    Arthritis    Blood transfusion without reported diagnosis    BMI 39.0-39.9,adult    Chronic hypokalemia    Chronic kidney disease    mild   Depression    GERD (gastroesophageal reflux disease)    H/O: hysterectomy     Heart murmur    Hypertension    Insomnia    Low back pain    Obesity    Occasional tremors    Palpitations    dx with tachycardia    PONV (postoperative nausea and vomiting)    Sacroiliitis (HCC)    Tiredness    Urinary frequency     Past Surgical History:  Procedure Laterality Date   CESAREAN SECTION     TOOTH EXTRACTION     TOTAL LAPAROSCOPIC HYSTERECTOMY WITH SALPINGECTOMY Bilateral 10/06/2017   Procedure: ATTEMPTED HYSTERECTOMY TOTAL LAPAROSCOPIC WITH SALPINGECTOMY CONVERTED TO OPEN ABDOMINAL TOTAL HYSTERECTOMY WITH SALPINGECTOMY;  Surgeon: Edwinna Areola, DO;  Location: [facility];  Service: Gynecology;  Laterality: Bilateral;    Social History:  reports that she has never smoked. She has never used smokeless tobacco. She reports that she does not drink alcohol and does not use drugs.  Allergies:  Allergies  Allergen Reactions   Ace Inhibitors Anaphylaxis and Swelling    Swelling of tongue.    Medications Prior to Admission  Medication Sig Dispense Refill   albuterol (PROVENTIL) (2.5 MG/3ML) 0.083% nebulizer solution USE 1 VIAL VIA NEBULIZER EVERY 6 HOURS AS NEEDED FOR WHEEZING OR  SHORTNESS OF BREATH 75 mL 0   AMITIZA 24 MCG capsule      buPROPion [facility]  XL) 300 MG 24 hr tablet Take 1 tablet (300 mg total) by mouth daily. 90 tablet 0   carvedilol (COREG) 25 MG tablet TAKE 1 TABLET BY MOUTH TWICE  DAILY 30 tablet 0   cetirizine (ZYRTEC) 10 MG tablet TAKE 1 TABLET(10 MG) BY MOUTH AT BEDTIME AS NEEDED FOR ALLERGIES 90 tablet 1   famotidine (PEPCID) 20 MG tablet TAKE 1 TABLET(20 MG) BY MOUTH TWICE DAILY 60 tablet 3   fluticasone (FLONASE) 50 MCG/ACT nasal spray SHAKE LIQUID AND USE 2 SPRAYS IN EACH NOSTRIL DAILY 48 g 3   furosemide (LASIX) 20 MG tablet Take 20 mg by mouth 2 (two) times daily.      gabapentin (NEURONTIN) 300 MG capsule Take 2 capsules by mouth 3 (three) times daily.     losartan (COZAAR) 100 MG tablet Take 1 tablet (100 mg total)  by mouth daily. 90 tablet 3   NARCAN 4 MG/0.1ML LIQD nasal spray kit Place 1 spray into the nose as directed.     nitrofurantoin, macrocrystal-monohydrate, (MACROBID) 100 MG capsule Take 1 capsule (100 mg total) by mouth 2 (two) times daily for 7 days. 14 capsule 0   oxybutynin (DITROPAN-XL) 10 MG 24 hr tablet TAKE 1 TABLET BY MOUTH AT  BEDTIME 100 tablet 2   phenazopyridine (PYRIDIUM) 200 MG tablet Take 1 tablet (200 mg total) by mouth 3 (three) times daily as needed for up to 7 days for pain. 21 tablet 0   primidone (MYSOLINE) 50 MG tablet TAKE 1 TABLET BY MOUTH EVERY  MORNING AND TAKE 6 TABLETS BY  MOUTH IN THE EVENING 220 tablet 0   sertraline (ZOLOFT) 100 MG tablet Take 1 tablet (100 mg total) by mouth daily. 90 tablet 0   spironolactone (ALDACTONE) 100 MG tablet Take 100 mg by mouth 2 (two) times daily.     SYMBICORT 80-4.5 MCG/ACT inhaler USE 2 INHALATIONS BY MOUTH TWICE DAILY 30.6 g 3   VENTOLIN HFA 108 (90 Base) MCG/ACT inhaler USE 2 INHALATIONS BY MOUTH EVERY 6 HOURS AS NEEDED FOR WHEEZING  OR SHORTNESS OF BREATH 54 g 3   zolpidem (AMBIEN CR) 12.5 MG CR tablet Take 1 tablet (12.5 mg total) by mouth at bedtime as needed for sleep. 30 tablet 2     Physical Exam: Blood pressure (!) 156/80, pulse 73, temperature 98.6 F (37 C), temperature source Oral, resp. rate 18, height 5' 6.5" (1.689 m), weight 118.8 kg, last menstrual period 09/17/2017, SpO2 96%. General: pleasant, WD/WN female who is laying in bed in NAD HEENT: head is normocephalic, atraumatic.  Sclera are non-icteric.  Heart: regular, rate, and rhythm.   Lungs: CTAB, no wheezes, rhonchi, or rales noted.  Respiratory effort nonlabored Abd:  Soft, ND, generalized ttp, +BS. No masses, hernias, or organomegaly MS: no BUE or BLE edema Skin: warm and dry  Psych: A&Ox4 with an appropriate affect Neuro: normal speech, thought process intact, moves all extremities, gait not assessed  Results for orders placed or performed during the  hospital encounter of 04/11/24 (from the past 48 hours)  CBC     Status: Abnormal   Collection Time: 04/11/24  5:34 PM  Result Value Ref Range   WBC 27.5 (H) 4.0 - 10.5 K/uL   RBC 3.95 3.87 - 5.11 MIL/uL   Hemoglobin 11.7 (L) 12.0 - 15.0 g/dL   HCT 04.5 (L) 40.9 - 81.1 %   MCV 88.9 80.0 - 100.0 fL   MCH 29.6 26.0 - 34.0 pg   MCHC 33.3 30.0 -  36.0 g/dL   RDW 53.6 64.4 - 03.4 %   Platelets 477 (H) 150 - 400 K/uL   nRBC 0.0 0.0 - 0.2 %    Comment: Performed at [facility], [address] [place], [place] [address]  Basic metabolic panel     Status: Abnormal   Collection Time: 04/11/24  5:34 PM  Result Value Ref Range   Sodium 133 (L) 135 - 145 mmol/L   Potassium 3.8 3.5 - 5.1 mmol/L   Chloride 96 (L) 98 - 111 mmol/L   CO2 25 22 - 32 mmol/L   Glucose, Bld 139 (H) 70 - 99 mg/dL    Comment: Glucose reference range applies only to samples taken after fasting for at least 8 hours.   BUN 12 6 - 20 mg/dL   Creatinine, Ser 5.63 (H) 0.44 - 1.00 mg/dL   Calcium 9.9 8.9 - 87.5 mg/dL   GFR, Estimated 41 (L) >60 mL/min    Comment: (NOTE) Calculated using the CKD-EPI Creatinine Equation (2021)    Anion gap 12 5 - 15    Comment: Performed at [facility], [address] [place], [place] [address]  Hepatic function panel     Status: Abnormal   Collection Time: 04/11/24  5:39 PM  Result Value Ref Range   Total Protein 7.3 6.5 - 8.1 g/dL   Albumin 4.1 3.5 - 5.0 g/dL   AST 22 15 - 41 U/L   ALT 34 0 - 44 U/L   Alkaline Phosphatase 181 (H) 38 - 126 U/L   Total Bilirubin 0.8 0.0 - 1.2 mg/dL   Bilirubin, Direct 0.2 0.0 - 0.2 mg/dL   Indirect Bilirubin 0.6 0.3 - 0.9 mg/dL    Comment: Performed at [facility], [address], [place], [place] [address]  Urinalysis, Routine w reflex microscopic -Urine, Clean Catch     Status: Abnormal   Collection Time: 04/11/24  7:03 PM  Result Value Ref Range   Color, Urine YELLOW YELLOW    APPearance CLEAR CLEAR   Specific Gravity, Urine >1.046 (H) 1.005 - 1.030   pH 5.5 5.0 - 8.0   Glucose, UA NEGATIVE NEGATIVE mg/dL   Hgb urine dipstick NEGATIVE NEGATIVE   Bilirubin Urine SMALL (A) NEGATIVE   Ketones, ur NEGATIVE NEGATIVE mg/dL   Protein, ur 30 (A) NEGATIVE mg/dL   Nitrite POSITIVE (A) NEGATIVE   Leukocytes,Ua NEGATIVE NEGATIVE   RBC / HPF 6-10 0 - 5 RBC/hpf   WBC, UA 11-20 0 - 5 WBC/hpf   Bacteria, UA RARE (A) NONE SEEN   Squamous Epithelial / HPF 0-5 0 - 5 /HPF   Mucus PRESENT    Hyaline Casts, UA PRESENT     Comment: Performed at [facility], [address] [place], [place] [address]  Lactic acid, plasma     Status: None   Collection Time: 04/11/24  9:05 PM  Result Value Ref Range   Lactic Acid, Venous 1.0 0.5 - 1.9 mmol/L    Comment: Performed at [facility], [address] [place], [place], [place] [address]  Basic metabolic panel     Status: Abnormal   Collection Time: 04/12/24  5:07 AM  Result Value Ref Range   Sodium 136 135 - 145 mmol/L   Potassium 3.3 (L) 3.5 - 5.1 mmol/L   Chloride 99 98 - 111 mmol/L   CO2 26 22 - 32 mmol/L   Glucose, Bld 113 (H) 70 - 99 mg/dL    Comment: Glucose reference  range applies only to samples taken after fasting for at least 8 hours.   BUN 9 6 - 20 mg/dL   Creatinine, Ser 4.08 0.44 - 1.00 mg/dL   Calcium 9.1 8.9 - 14.4 mg/dL   GFR, Estimated >81 >85 mL/min    Comment: (NOTE) Calculated using the CKD-EPI Creatinine Equation (2021)    Anion gap 11 5 - 15    Comment: Performed at [facility], [address] [place], [place], [place] [address]  CBC     Status: Abnormal   Collection Time: 04/12/24  5:07 AM  Result Value Ref Range   WBC 25.8 (H) 4.0 - 10.5 K/uL   RBC 3.84 (L) 3.87 - 5.11 MIL/uL   Hemoglobin 11.3 (L) 12.0 - 15.0 g/dL   HCT 70.2 (L) 63.7 - 85.8 %   MCV 87.8 80.0 - 100.0 fL   MCH 29.4 26.0 - 34.0 pg   MCHC 33.5 30.0 - 36.0 g/dL   RDW 85.0 27.7 - 41.2 %    Platelets 476 (H) 150 - 400 K/uL   nRBC 0.0 0.0 - 0.2 %    Comment: Performed at [facility], [address], [place], [place] [address]   CT ABDOMEN PELVIS W CONTRAST Result Date: 04/11/2024 CLINICAL DATA:  Abdominal pain.  Constipation. EXAM: CT ABDOMEN AND PELVIS WITH CONTRAST TECHNIQUE: Multidetector CT imaging of the abdomen and pelvis was performed using the standard protocol following bolus administration of intravenous contrast. RADIATION DOSE REDUCTION: This exam was performed according to the departmental dose-optimization program which includes automated exposure control, adjustment of the mA and/or kV according to patient size and/or use of iterative reconstruction technique. CONTRAST:  100mL OMNIPAQUE IOHEXOL 300 MG/ML  SOLN COMPARISON:  11/24/2017. FINDINGS: Lower chest: No acute abnormality. No pleural or pericardial effusions. 3 mm nodule left lung base is unchanged compared to the 2018 study. Hepatobiliary: 1 cm hypodensity left lobe of the liver, likely a cyst. No biliary ductal dilatation. Unremarkable gallbladder. Pancreas: Unremarkable. No pancreatic ductal dilatation or surrounding inflammatory changes. Spleen: Normal in size without focal abnormality. Adrenals/Urinary Tract: 9 mm right adrenal nodules unchanged compared to 2018. No renal parenchymal abnormalities. No hydronephrosis or nephrolithiasis. Unremarkable urinary bladder. Stomach/Bowel: Dilated appendix with extensive periappendiceal inflammatory changes and F fluid collection consistent with an abscess measuring 5 cm. Mildly dilated loops of small bowel adjacent to the inflammatory process likely representing reactive changes an ileus. No bowel obstruction with normal stool and air in the colon and rectosigmoid. Vascular/Lymphatic: No significant vascular findings are present. No enlarged abdominal or pelvic lymph nodes. Reproductive: Status post hysterectomy. No adnexal masses. Other: No abdominal wall  hernia or abnormality. No abdominopelvic ascites. Musculoskeletal: Lumbosacral degenerative changes with L5-S1 disc space narrowing, sclerosis and osteophytes. Grade 1 L4 retrolisthesis. IMPRESSION: 1. Acute appendicitis with periappendiceal abscess. 2. Stable right adrenal nodule. 3. Stable left lower lobe pulmonary nodule. Electronically Signed   By: Sydell Eva M.D.   On: 04/11/2024 20:50    Anti-infectives (From admission, onward)    Start     Dose/Rate Route Frequency Ordered Stop   04/12/24 0313  cefTRIAXone (ROCEPHIN) 2 g in sodium chloride 0.9 % 100 mL IVPB       Placed in "And" Linked Group   2 g 200 mL/hr over 30 Minutes Intravenous Every 24 hours 04/12/24 0313 04/18/24 1759   04/12/24 0313  metroNIDAZOLE (FLAGYL) IVPB 500 mg       Placed in "And" Linked Group   500 mg 100 mL/hr over  60 Minutes Intravenous Every 12 hours 04/12/24 0313 04/18/24 2159   04/11/24 2115  metroNIDAZOLE (FLAGYL) IVPB 500 mg        500 mg 100 mL/hr over 60 Minutes Intravenous  Once 04/11/24 2108 04/11/24 2210   04/11/24 2045  cefTRIAXone (ROCEPHIN) 2 g in sodium chloride 0.9 % 100 mL IVPB        2 g 200 mL/hr over 30 Minutes Intravenous  Once 04/11/24 2034 04/11/24 2110       Assessment/Plan Perforated appendicitis with an abscess Is a 53 year old female who presented with 8-day history of generalized abdominal pain with associated subjective fever, chills, nausea, vomiting and constipation.  Her workup was concerning for acute appendicitis with a periappendiceal abscess.  She is currently hemodynamically stable without fever, tachycardia or hypotension.  She does have an elevated WBC at 25.8.  She is fairly tender to palpation on exam generally.  I reviewed the case with my attending.  Recommend IR consult for drainage.  If IR unable to drain, will plan OR today.  Continue antibiotics and keep NPO. Home meds   Update: IR unable to drain. Recommended appendectomy. I have explained the procedure,  risks, and aftercare of Laparoscopic Appendectomy.  Risks include but are not limited to anesthesia (MI, CVA, death, aspiration, prolonged intubation), bleeding, infection, wound problems, hernia, injury to surrounding structures (viscus, nerves, blood vessels, ureter), need for conversion to open procedure or ileocecectomy/R colectomy, post operative ileus or abscess, staple line leak and increased risk of DVT/PE.  She seems to understand and agrees to proceed with surgery. Plan OR today.   FEN - NPO, IVF VTE - SCDs, Lovenox ID - Rocephin/Flagyl Foley - None, ucx pending Dispo - Admit to observation.   Incidental findings -right adrenal nodule, left lower lobe pulmonary nodule.  Will need PCP follow-up.  I reviewed nursing notes, last 24 h vitals and pain scores, last 48 h intake and output, last 24 h labs and trends, and last 24 h imaging results.  Delton Filbert, [facility] 04/12/2024, 12:13 PM Please see Amion for pager number during day hours 7:00am-4:30pm

## 2024-04-12 NOTE — Anesthesia Postprocedure Evaluation (Signed)
 Anesthesia Post Note  Patient: Erin Bradford  Procedure(s) Performed: APPENDECTOMY, LAPAROSCOPIC     Patient location during evaluation: PACU Anesthesia Type: General Level of consciousness: awake and alert Pain management: pain level controlled Vital Signs Assessment: post-procedure vital signs reviewed and stable Respiratory status: spontaneous breathing, nonlabored ventilation, respiratory function stable and patient connected to nasal cannula oxygen Cardiovascular status: blood pressure returned to baseline and stable Postop Assessment: no apparent nausea or vomiting Anesthetic complications: no  No notable events documented.  Last Vitals:  Vitals:   04/12/24 1930 04/12/24 1952  BP: (!) 162/96 (!) 165/85  Pulse: 78 78  Resp: 19 19  Temp: 37.1 C 37 C  SpO2: 98% 100%    Last Pain:  Vitals:   04/12/24 2027  TempSrc:   PainSc: 9                  Boston Catarino,W. EDMOND

## 2024-04-12 NOTE — Interval H&P Note (Signed)
 History and Physical Interval Note:  04/12/2024 1:44 PM  Erin Bradford  has presented today for surgery, with the diagnosis of PERFORATED APPENDICITIS.  The various methods of treatment have been discussed with the patient and family. After consideration of risks, benefits and other options for treatment, the patient has consented to  Procedure(s): APPENDECTOMY, LAPAROSCOPIC (N/A) as a surgical intervention.  The patient's history has been reviewed, patient examined, no change in status, stable for surgery.  I have reviewed the patient's chart and labs.  Questions were answered to the patient's satisfaction.     [name]

## 2024-04-12 NOTE — Anesthesia Procedure Notes (Signed)
 Procedure Name: Intubation Date/Time: 04/12/2024 2:36 PM  Performed by: Deneice Finland, CRNAPre-anesthesia Checklist: Patient identified, Emergency Drugs available, Suction available and Patient being monitored Patient Re-evaluated:Patient Re-evaluated prior to induction Oxygen Delivery Method: Circle System Utilized Preoxygenation: Pre-oxygenation with 100% oxygen Induction Type: IV induction, Rapid sequence and Cricoid Pressure applied Laryngoscope Size: Mac and 4 Grade View: Grade I Tube type: Oral Number of attempts: 1 Airway Equipment and Method: Stylet and Oral airway Placement Confirmation: ETT inserted through vocal cords under direct vision, positive ETCO2 and breath sounds checked- equal and bilateral Secured at: 22 cm Tube secured with: Tape Dental Injury: Teeth and Oropharynx as per pre-operative assessment

## 2024-04-12 NOTE — ED Notes (Signed)
Attempted to call report- no answer x 2.

## 2024-04-12 NOTE — Op Note (Signed)
 04/11/2024 - 04/12/2024  5:57 PM  PATIENT:  Erin Bradford  53 y.o. female  PRE-OPERATIVE DIAGNOSIS:  PERFORATED APPENDICITIS  POST-OPERATIVE DIAGNOSIS:  PERFORATED APPENDICITIS  PROCEDURE:  Procedure(s): APPENDECTOMY, LAPAROSCOPIC ATTEMPTED OPEN ILEOCECECTOMY  SURGEON:  Surgeons and Role:    * Caralyn Chandler, MD - Primary    * Lujean Sake, MD - Assisting  PHYSICIAN ASSISTANT:   ASSISTANTS: Dr. Leighton Punches   ANESTHESIA:   local and general  EBL:  50cc   BLOOD ADMINISTERED:none  DRAINS: (1) Blake drain(s) in the pelvis    LOCAL MEDICATIONS USED:  MARCAINE     SPECIMEN:  Source of Specimen:  appendix and ileocecectomy  DISPOSITION OF SPECIMEN:  PATHOLOGY  COUNTS:  YES  TOURNIQUET:  * No tourniquets in log *  DICTATION: .Dragon Dictation  After informed consent was obtained patient was brought to the operating room placed in the supine position on the operating room table. After adequate induction of general anesthesia the patient's abdomen was prepped with ChloraPrep, allowed to dry, and draped in usual sterile manner. The area below the umbilicus was infiltrated with quarter percent Marcaine. A small incision was made with a 15 blade knife. This incision was carried down through the subcutaneous tissue bluntly with a hemostat and Army-Navy retractors until the linea alba was identified. The linea alba was incised with a 15 blade knife. Each side was grasped Coker clamps and elevated anteriorly. The preperitoneal space was probed bluntly with a hemostat until the peritoneum was opened and access was gained to the abdominal cavity. A 0 Vicryl purse string stitch was placed in the fascia surrounding the opening. A Hassan cannula was placed through the opening and anchored in place with the previously placed Vicryl purse string stitch. The laparoscope was placed through the [facility] cannula. The abdomen was insufflated with carbon dioxide without difficulty. Next the suprapubic  area was infiltrated with quarter percent Marcaine. A small incision was made with a 15 blade knife. A 5 mm port was placed bluntly through this incision into the abdominal cavity. A site was then chosen in the right upper quadrant for placement of a 5 mm port. The area was infiltrated with quarter percent Marcaine. A small stab incision was made with a 15 blade knife. A 5 mm port was placed bluntly through this incision and the abdominal cavity under direct vision. The laparoscope was then moved to the suprapubic port. Using a Glassman grasper and harmonic scalpel the right lower quadrant was inspected.  The appendix was severely inflamed and a loop of small bowel was densely adherent to the abscess cavity associated with the appendix.  I spent over an hour bluntly separating the small bowel from the pelvic sidewall and trying to identify the appendix.  I was eventually able to get into the abscess cavity and evacuated.  The appendix was identified.  It was very thick-walled and large and inflamed.  After much blunt dissection I was able to get around the base of the appendix but the appendix was so thick that the stapler would not seal when placed across the base.  After several attempts to staple the base of the appendix it was clear that it would leak if left the way it was.  At this point I elected to open the abdominal cavity.  I mobilized some of the right colon by incising its retroperitoneal attachment along the white line of Toldt.  I then chose a site on the distal small intestine close to  the cecum where the small intestine appeared normal.  The mesentery at this point was opened sharply with the electrocautery.  The small bowel at this point was divided with a single firing of a GIA 75 stapler.  I then was able to identify a point on the cecum where the cecum was healthy appearing.  The mesentery at this point was opened sharply with the electrocautery and the cecum was divided with a single firing of a  GIA 75 stapler.  The mesentery to the terminal ileum and cecum was then taken down sharply with the harmonic scalpel.  Once this dissection was complete then the terminal ileum and proximal cecum were removed and sent to pathology for further evaluation.  The small bowel and right colon approximated each other easily.  I then made a small opening on the antimesenteric side of each limb of small bowel and colon.  Each limb of a GIA 75 stapler was then placed down the appropriate limb of small bowel or colon, clamped, and fired thereby creating a nice widely patent enteroenterostomy.  The common opening was closed with a single firing of a TA 60 stapler.  The staple line was imbricated with multiple 2-0 silk stitches as well as a 2-0 silk crotch stitch.  The mesentery was closed with a couple of 2-0 silk figure-of-eight stitches.  Once this was accomplished and the anastomosis appeared to be viable and patent.  It was placed back into the abdominal cavity.  The abdomen was then irrigated with copious amounts of saline.  The anastomosis was inspected again and looked healthy.  Next a drain was placed through the suprapubic port and into the abdominal cavity and placed in the pelvis where the abscess cavity was.  At this point the fascia of the anterior abdominal wall was closed with 2 running #1 double-stranded looped PDS sutures.  The subcutaneous tissue was packed with moistened Kerlix gauze.  Sterile dressings and drain dressings were applied.  The patient tolerated the procedure well.  At the end of the case all needle sponge and instrument counts were correct.  The patient was then awakened and taken to recovery in stable condition.  The assistant was instrumental for visualization and completion of the case. PLAN OF CARE: Admit to inpatient   PATIENT DISPOSITION:  PACU - hemodynamically stable.   Delay start of Pharmacological VTE agent (>24hrs) due to surgical blood loss or risk of bleeding: no

## 2024-04-12 NOTE — ED Notes (Signed)
 [facility] in ED preparing pt for transfer

## 2024-04-12 NOTE — ED Notes (Signed)
 Lucretia Kern at [facility] for transport

## 2024-04-12 NOTE — Plan of Care (Signed)
   Problem: Education: Goal: Knowledge of General Education information will improve Description: Including pain rating scale, medication(s)/side effects and non-pharmacologic comfort measures Outcome: Progressing   Problem: Activity: Goal: Risk for activity intolerance will decrease Outcome: Progressing   Problem: Nutrition: Goal: Adequate nutrition will be maintained Outcome: Progressing   Problem: Coping: Goal: Level of anxiety will decrease Outcome: Progressing

## 2024-04-12 NOTE — Anesthesia Preprocedure Evaluation (Signed)
 Anesthesia Evaluation  Patient identified by MRN, date of birth, ID band Patient awake    Reviewed: Allergy & Precautions, NPO status , Patient's Chart, lab work & pertinent test results  History of Anesthesia Complications (+) PONV and history of anesthetic complications  Airway Mallampati: II  TM Distance: >3 FB Neck ROM: Full    Dental no notable dental hx.    Pulmonary asthma    Pulmonary exam normal        Cardiovascular hypertension, Pt. on medications and Pt. on home beta blockers  Rhythm:Regular Rate:Normal     Neuro/Psych   Anxiety Depression    negative neurological ROS     GI/Hepatic Neg liver ROS,GERD  ,,  Endo/Other  negative endocrine ROS    Renal/GU CRFRenal disease  negative genitourinary   Musculoskeletal  (+) Arthritis , Osteoarthritis,    Abdominal Normal abdominal exam  (+)   Peds  Hematology  (+) Blood dyscrasia, anemia Lab Results      Component                Value               Date                      WBC                      25.8 (H)            04/12/2024                HGB                      11.3 (L)            04/12/2024                HCT                      33.7 (L)            04/12/2024                MCV                      87.8                04/12/2024                PLT                      476 (H)             04/12/2024              Anesthesia Other Findings   Reproductive/Obstetrics                             Anesthesia Physical Anesthesia Plan  ASA: 3  Anesthesia Plan: General   Post-op Pain Management: Tylenol PO (pre-op)*   Induction: Intravenous  PONV Risk Score and Plan: 4 or greater and Ondansetron, Dexamethasone, Midazolam and Treatment may vary due to age or medical condition  Airway Management Planned: Mask and Oral ETT  Additional Equipment: None  Intra-op Plan:   Post-operative Plan: Extubation in OR  Informed  Consent: I have reviewed the patients History and Physical, chart, labs and discussed the procedure  including the risks, benefits and alternatives for the proposed anesthesia with the patient or authorized representative who has indicated his/her understanding and acceptance.     Dental advisory given  Plan Discussed with: CRNA  Anesthesia Plan Comments:        Anesthesia Quick Evaluation

## 2024-04-12 NOTE — ED Notes (Signed)
 Pt en route to [facility] w/ [facility]

## 2024-04-13 ENCOUNTER — Encounter (HOSPITAL_COMMUNITY): Payer: Self-pay | Admitting: General Surgery

## 2024-04-13 LAB — BASIC METABOLIC PANEL WITH GFR
Anion gap: 13 (ref 5–15)
BUN: 5 mg/dL — ABNORMAL LOW (ref 6–20)
CO2: 25 mmol/L (ref 22–32)
Calcium: 8.4 mg/dL — ABNORMAL LOW (ref 8.9–10.3)
Chloride: 99 mmol/L (ref 98–111)
Creatinine, Ser: 0.75 mg/dL (ref 0.44–1.00)
GFR, Estimated: >60 mL/min (ref >60)
Glucose, Bld: 157 mg/dL — ABNORMAL HIGH (ref 70–99)
Potassium: 3.7 mmol/L (ref 3.5–5.1)
Sodium: 137 mmol/L (ref 135–145)

## 2024-04-13 LAB — CBC
HCT: 33.3 % — ABNORMAL LOW (ref 36.0–46.0)
Hemoglobin: 11.1 g/dL — ABNORMAL LOW (ref 12.0–15.0)
MCH: 29.7 pg (ref 26.0–34.0)
MCHC: 33.3 g/dL (ref 30.0–36.0)
MCV: 89 fL (ref 80.0–100.0)
Platelets: 515 10*3/uL — ABNORMAL HIGH (ref 150–400)
RBC: 3.74 MIL/uL — ABNORMAL LOW (ref 3.87–5.11)
RDW: 12.7 % (ref 11.5–15.5)
WBC: 27 10*3/uL — ABNORMAL HIGH (ref 4.0–10.5)
nRBC: 0 % (ref 0.0–0.2)

## 2024-04-13 LAB — URINE CULTURE: Culture: <10000 — AB

## 2024-04-13 MED ORDER — HYDROMORPHONE HCL 1 MG/ML IJ SOLN
0.5000 mg | INTRAMUSCULAR | Status: DC | PRN
  Administered 2024-04-13 – 2024-04-17 (×21): 1 mg via INTRAVENOUS
  Filled 2024-04-13 (×21): qty 1

## 2024-04-13 NOTE — Progress Notes (Signed)
Removed patient's foley.

## 2024-04-13 NOTE — Progress Notes (Addendum)
 Assessment & Plan: POD#1 - status post ileocectomy for appendicitis - Dr. Alethea Andes, 04/12/2024  Continue clear liquid diet today  OOB, ambulate  Discontinue Foley catheter  Wound care  IV Rocephin , Flagyl         [name], MD [facility] practice Office: [phone]        Chief Complaint: Appendicitis  Subjective: Patient in bed, moderate pain.  Has not been up.  Tolerating clear liquid diet - does not want to advance.  Objective: Vital signs in last 24 hours: Temp:  [97.8 F (36.6 C)-98.8 F (37.1 C)] 98.2 F (36.8 C) (04/18 0826) Pulse Rate:  [62-85] 62 (04/18 0826) Resp:  [11-19] 17 (04/18 0826) BP: (116-170)/(64-117) 128/72 (04/18 0826) SpO2:  [91 %-100 %] 100 % (04/18 0826) Weight:  [117.9 kg-118.8 kg] 117.9 kg (04/17 1345) Last BM Date : 04/05/24  Intake/Output from previous day: 04/17 0701 - 04/18 0700 In: 2542 [P.O.:100; I.V.:1992; IV Piggyback:450] Out: 640 [Urine:375; Drains:265] Intake/Output this shift: Total I/O In: -  Out: 50 [Drains:50]  Physical Exam: HEENT - sclerae clear, mucous membranes moist Abdomen - soft, dressing dry and intact; JP with thin serosanguinous  Lab Results:  Recent Labs    04/12/24 0507 04/13/24 0326  WBC 25.8* 27.0*  HGB 11.3* 11.1*  HCT 33.7* 33.3*  PLT 476* 515*   BMET Recent Labs    04/12/24 0507 04/13/24 0326  NA 136 137  K 3.3* 3.7  CL 99 99  CO2 26 25  GLUCOSE 113* 157*  BUN 9 5*  CREATININE 0.87 0.75  CALCIUM  9.1 8.4*   PT/INR No results for input(s): "LABPROT", "INR" in the last 72 hours. Comprehensive Metabolic Panel:    Component Value Date/Time   NA 137 04/13/2024 0326   NA 136 04/12/2024 0507   NA 139 03/23/2023 1602   NA 136 07/05/2018 1401   NA 138 02/04/2017 1438   K 3.7 04/13/2024 0326   K 3.3 (L) 04/12/2024 0507   K 3.8 02/04/2017 1438   CL 99 04/13/2024 0326   CL 99 04/12/2024 0507   CO2 25 04/13/2024 0326   CO2 26 04/12/2024 0507   CO2 26  02/04/2017 1438   BUN 5 (L) 04/13/2024 0326   BUN 9 04/12/2024 0507   BUN 5 (L) 03/23/2023 1602   BUN 7 07/05/2018 1401   BUN 8.9 02/04/2017 1438   CREATININE 0.75 04/13/2024 0326   CREATININE 0.87 04/12/2024 0507   CREATININE 1.0 02/04/2017 1438   GLUCOSE 157 (H) 04/13/2024 0326   GLUCOSE 113 (H) 04/12/2024 0507   GLUCOSE 85 02/04/2017 1438   CALCIUM  8.4 (L) 04/13/2024 0326   CALCIUM  9.1 04/12/2024 0507   CALCIUM  9.2 02/04/2017 1438   AST 22 04/11/2024 1739   AST 27 04/10/2024 1420   AST 17 02/04/2017 1438   ALT 34 04/11/2024 1739   ALT 44 04/10/2024 1420   ALT 20 02/04/2017 1438   ALKPHOS 181 (H) 04/11/2024 1739   ALKPHOS 175 (H) 04/10/2024 1420   ALKPHOS 90 02/04/2017 1438   BILITOT 0.8 04/11/2024 1739   BILITOT 0.6 04/10/2024 1420   BILITOT <0.2 03/23/2023 1602   BILITOT 0.3 07/05/2018 1401   BILITOT <0.22 02/04/2017 1438   PROT 7.3 04/11/2024 1739   PROT 7.3 04/10/2024 1420   PROT 6.7 03/23/2023 1602   PROT 6.7 07/05/2018 1401   PROT 6.8 02/04/2017 1438   ALBUMIN  4.1 04/11/2024 1739   ALBUMIN  3.4 (L) 04/10/2024 1420   ALBUMIN   4.4 03/23/2023 1602   ALBUMIN  4.2 07/05/2018 1401   ALBUMIN  3.8 02/04/2017 1438    Studies/Results: CT ABDOMEN PELVIS W CONTRAST Result Date: 04/11/2024 CLINICAL DATA:  Abdominal pain.  Constipation. EXAM: CT ABDOMEN AND PELVIS WITH CONTRAST TECHNIQUE: Multidetector CT imaging of the abdomen and pelvis was performed using the standard protocol following bolus administration of intravenous contrast. RADIATION DOSE REDUCTION: This exam was performed according to the departmental dose-optimization program which includes automated exposure control, adjustment of the mA and/or kV according to patient size and/or use of iterative reconstruction technique. CONTRAST:  OMNIPAQUE  IOHEXOL  300 MG/ML  SOLN COMPARISON:  11/24/2017. FINDINGS: Lower chest: No acute abnormality. No pleural or pericardial effusions. 3 mm nodule left lung base is unchanged  compared to the 2018 study. Hepatobiliary: 1 cm hypodensity left lobe of the liver, likely a cyst. No biliary ductal dilatation. Unremarkable gallbladder. Pancreas: Unremarkable. No pancreatic ductal dilatation or surrounding inflammatory changes. Spleen: Normal in size without focal abnormality. Adrenals/Urinary Tract: 9 mm right adrenal nodules unchanged compared to 2018. No renal parenchymal abnormalities. No hydronephrosis or nephrolithiasis. Unremarkable urinary bladder. Stomach/Bowel: Dilated appendix with extensive periappendiceal inflammatory changes and F fluid collection consistent with an abscess measuring 5 cm. Mildly dilated loops of small bowel adjacent to the inflammatory process likely representing reactive changes an ileus. No bowel obstruction with normal stool and air in the colon and rectosigmoid. Vascular/Lymphatic: No significant vascular findings are present. No enlarged abdominal or pelvic lymph nodes. Reproductive: Status post hysterectomy. No adnexal masses. Other: No abdominal wall hernia or abnormality. No abdominopelvic ascites. Musculoskeletal: Lumbosacral degenerative changes with L5-S1 disc space narrowing, sclerosis and osteophytes. Grade 1 L4 retrolisthesis. IMPRESSION: 1. Acute appendicitis with periappendiceal abscess. 2. Stable right adrenal nodule. 3. Stable left lower lobe pulmonary nodule. Electronically Signed   By: Sydell Eva M.D.   On: 04/11/2024 20:50      [name] 04/13/2024  Patient ID: Erin Bradford, female   DOB: 01-08-1971, 53 y.o.   MRN: [id number]

## 2024-04-13 NOTE — Plan of Care (Signed)
  Problem: Pain Managment: Goal: General experience of comfort will improve and/or be controlled Outcome: Progressing   Problem: Safety: Goal: Ability to remain free from injury will improve Outcome: Progressing

## 2024-04-13 NOTE — Plan of Care (Signed)
  Problem: Education: Goal: Knowledge of General Education information will improve Description: Including pain rating scale, medication(s)/side effects and non-pharmacologic comfort measures Outcome: Progressing   Problem: Nutrition: Goal: Adequate nutrition will be maintained Outcome: Progressing   Problem: Activity: Goal: Risk for activity intolerance will decrease Outcome: Not Progressing   Problem: Pain Managment: Goal: General experience of comfort will improve and/or be controlled Outcome: Not Progressing

## 2024-04-14 NOTE — Progress Notes (Signed)
 Pt continues to require q2 Iv pain medication with very little movement. Pt did allow me to change dressing this shift. Must be extremely gentle. Use adhesive remover on tape and lots of saline when removing old dressing from wound.

## 2024-04-14 NOTE — Progress Notes (Signed)
 Assessment & Plan: POD#2 - status post ileocectomy for appendicitis - Dr. Alethea Andes, 04/12/2024             Continue clear liquid diet today             OOB, ambulate             Begin dressing changes - discussed with patient and nurse             IV Rocephin , Flagyl         [name], MD [facility] practice Office: [phone]        Chief Complaint: Complicated appendicitis  Subjective: Patient with some pain, nausea.  Limited po liquids.  Dressing change not done.  Has not been out of bed.  Objective: Vital signs in last 24 hours: Temp:  [97.7 F (36.5 C)-99.1 F (37.3 C)] 98.7 F (37.1 C) (04/19 0745) Pulse Rate:  [59-78] 73 (04/19 0745) Resp:  [17] 17 (04/19 0745) BP: (128-163)/(63-102) 149/102 (04/19 0745) SpO2:  [93 %-100 %] 93 % (04/19 0745) Last BM Date : 04/05/24  Intake/Output from previous day: 04/18 0701 - 04/19 0700 In: 780 [P.O.:480; IV Piggyback:300] Out: 140 [Drains:140] Intake/Output this shift: No intake/output data recorded.  Physical Exam: HEENT - sclerae clear, mucous membranes moist Abdomen - soft, obese; dressing with small serosanguinous; moderate tenderness  Lab Results:  Recent Labs    04/12/24 0507 04/13/24 0326  WBC 25.8* 27.0*  HGB 11.3* 11.1*  HCT 33.7* 33.3*  PLT 476* 515*   BMET Recent Labs    04/12/24 0507 04/13/24 0326  NA 136 137  K 3.3* 3.7  CL 99 99  CO2 26 25  GLUCOSE 113* 157*  BUN 9 5*  CREATININE 0.87 0.75  CALCIUM  9.1 8.4*   PT/INR No results for input(s): "LABPROT", "INR" in the last 72 hours. Comprehensive Metabolic Panel:    Component Value Date/Time   NA 137 04/13/2024 0326   NA 136 04/12/2024 0507   NA 139 03/23/2023 1602   NA 136 07/05/2018 1401   NA 138 02/04/2017 1438   K 3.7 04/13/2024 0326   K 3.3 (L) 04/12/2024 0507   K 3.8 02/04/2017 1438   CL 99 04/13/2024 0326   CL 99 04/12/2024 0507   CO2 25 04/13/2024 0326   CO2 26 04/12/2024 0507   CO2 26  02/04/2017 1438   BUN 5 (L) 04/13/2024 0326   BUN 9 04/12/2024 0507   BUN 5 (L) 03/23/2023 1602   BUN 7 07/05/2018 1401   BUN 8.9 02/04/2017 1438   CREATININE 0.75 04/13/2024 0326   CREATININE 0.87 04/12/2024 0507   CREATININE 1.0 02/04/2017 1438   GLUCOSE 157 (H) 04/13/2024 0326   GLUCOSE 113 (H) 04/12/2024 0507   GLUCOSE 85 02/04/2017 1438   CALCIUM  8.4 (L) 04/13/2024 0326   CALCIUM  9.1 04/12/2024 0507   CALCIUM  9.2 02/04/2017 1438   AST 22 04/11/2024 1739   AST 27 04/10/2024 1420   AST 17 02/04/2017 1438   ALT 34 04/11/2024 1739   ALT 44 04/10/2024 1420   ALT 20 02/04/2017 1438   ALKPHOS 181 (H) 04/11/2024 1739   ALKPHOS 175 (H) 04/10/2024 1420   ALKPHOS 90 02/04/2017 1438   BILITOT 0.8 04/11/2024 1739   BILITOT 0.6 04/10/2024 1420   BILITOT <0.2 03/23/2023 1602   BILITOT 0.3 07/05/2018 1401   BILITOT <0.22 02/04/2017 1438   PROT 7.3 04/11/2024 1739   PROT 7.3 04/10/2024 1420  PROT 6.7 03/23/2023 1602   PROT 6.7 07/05/2018 1401   PROT 6.8 02/04/2017 1438   ALBUMIN  4.1 04/11/2024 1739   ALBUMIN  3.4 (L) 04/10/2024 1420   ALBUMIN  4.4 03/23/2023 1602   ALBUMIN  4.2 07/05/2018 1401   ALBUMIN  3.8 02/04/2017 1438    Studies/Results: No results found.    [name] 04/14/2024  Patient ID: Erin Bradford, female   DOB: 1971-01-11, 53 y.o.   MRN: [id number]

## 2024-04-14 NOTE — Plan of Care (Signed)
  Problem: Pain Managment: Goal: General experience of comfort will improve and/or be controlled Outcome: Progressing   Problem: Safety: Goal: Ability to remain free from injury will improve Outcome: Progressing

## 2024-04-15 ENCOUNTER — Other Ambulatory Visit: Payer: Self-pay

## 2024-04-15 MED ORDER — LORAZEPAM 2 MG/ML IJ SOLN
0.2500 mg | Freq: Three times a day (TID) | INTRAMUSCULAR | Status: DC | PRN
  Administered 2024-04-15: 0.25 mg via INTRAVENOUS
  Filled 2024-04-15 (×2): qty 1

## 2024-04-15 MED ORDER — METRONIDAZOLE 500 MG PO TABS
500.0000 mg | ORAL_TABLET | Freq: Once | ORAL | Status: AC
  Administered 2024-04-15: 500 mg via ORAL
  Filled 2024-04-15: qty 1

## 2024-04-15 MED ORDER — ALUM & MAG HYDROXIDE-SIMETH 200-200-20 MG/5ML PO SUSP
15.0000 mL | Freq: Four times a day (QID) | ORAL | Status: DC | PRN
  Administered 2024-04-15: 15 mL via ORAL
  Filled 2024-04-15: qty 30

## 2024-04-15 MED ORDER — PROCHLORPERAZINE EDISYLATE 10 MG/2ML IJ SOLN
10.0000 mg | Freq: Once | INTRAMUSCULAR | Status: AC
  Administered 2024-04-15: 10 mg via INTRAVENOUS
  Filled 2024-04-15: qty 2

## 2024-04-15 MED ORDER — LORAZEPAM 0.5 MG PO TABS
0.2500 mg | ORAL_TABLET | Freq: Three times a day (TID) | ORAL | Status: DC | PRN
  Administered 2024-04-16 – 2024-04-22 (×2): 0.25 mg via ORAL
  Filled 2024-04-15 (×2): qty 1

## 2024-04-15 NOTE — Progress Notes (Signed)
 At bedside for PIV insertion. Attempted x 2 with no success. Vessels 1.5/2 cm in depth and no other vessels identified with U/S. Recommend central access for ordered meds/IVF. RN made aware.

## 2024-04-15 NOTE — Progress Notes (Signed)
 Pt had a difficult night, could not get comfortable, pt up to chair x2 assist, wanted to get back in bed, called multiple times complaining she couldn't get comfortable, pain meds given with mild relief, voiced nausea last night, zofran  given w/no relief, notified on call doctor, Compazine  given, pt vomited shortly after, cold cloth placed on head, pt c/o SOB, O2 sats 98% on RA, vss, 02 applied @ 2L, pt appears very anxious, Ativan  0.25mg  ordered x1 given @0700 . Pt also placed in bouncy chair for relief of gas.

## 2024-04-15 NOTE — Progress Notes (Signed)
 Assessment & Plan: POD#3 - status post ileocectomy for appendicitis - Dr. Alethea Andes, 04/12/2024             Persistent nausea, dry heaves - discussed NG but will hold off Continue clear liquid diet today             OOB, ambulate             Dressing changes BID             IV Rocephin , Flagyl   Will check labs in AM 4/21        Erin Billow, MD [facility] practice Office: [phone]        Chief Complaint: Complicated appendicitis  Subjective: Patient up in chair, family in room.  Nauseated.  Denies flatus or BM.  Objective: Vital signs in last 24 hours: Temp:  [98.4 F (36.9 C)-98.7 F (37.1 C)] 98.4 F (36.9 C) (04/20 0745) Pulse Rate:  [73-84] 79 (04/20 0745) Resp:  [16-19] 16 (04/20 0745) BP: (135-184)/(92-109) 171/109 (04/20 0745) SpO2:  [93 %-98 %] 95 % (04/20 0745) Last BM Date : 04/05/24  Intake/Output from previous day: 04/19 0701 - 04/20 0700 In: 360 [P.O.:360] Out: 510 [Drains:510] Intake/Output this shift: No intake/output data recorded.  Physical Exam: HEENT - sclerae clear, mucous membranes moist Abdomen - softer, mild distension; dressing dry and intact  Lab Results:  Recent Labs    04/13/24 0326  WBC 27.0*  HGB 11.1*  HCT 33.3*  PLT 515*   BMET Recent Labs    04/13/24 0326  NA 137  K 3.7  CL 99  CO2 25  GLUCOSE 157*  BUN 5*  CREATININE 0.75  CALCIUM  8.4*   PT/INR No results for input(s): "LABPROT", "INR" in the last 72 hours. Comprehensive Metabolic Panel:    Component Value Date/Time   NA 137 04/13/2024 0326   NA 136 04/12/2024 0507   NA 139 03/23/2023 1602   NA 136 07/05/2018 1401   NA 138 02/04/2017 1438   K 3.7 04/13/2024 0326   K 3.3 (L) 04/12/2024 0507   K 3.8 02/04/2017 1438   CL 99 04/13/2024 0326   CL 99 04/12/2024 0507   CO2 25 04/13/2024 0326   CO2 26 04/12/2024 0507   CO2 26 02/04/2017 1438   BUN 5 (L) 04/13/2024 0326   BUN 9 04/12/2024 0507   BUN 5 (L) 03/23/2023 1602   BUN 7  07/05/2018 1401   BUN 8.9 02/04/2017 1438   CREATININE 0.75 04/13/2024 0326   CREATININE 0.87 04/12/2024 0507   CREATININE 1.0 02/04/2017 1438   GLUCOSE 157 (H) 04/13/2024 0326   GLUCOSE 113 (H) 04/12/2024 0507   GLUCOSE 85 02/04/2017 1438   CALCIUM  8.4 (L) 04/13/2024 0326   CALCIUM  9.1 04/12/2024 0507   CALCIUM  9.2 02/04/2017 1438   AST 22 04/11/2024 1739   AST 27 04/10/2024 1420   AST 17 02/04/2017 1438   ALT 34 04/11/2024 1739   ALT 44 04/10/2024 1420   ALT 20 02/04/2017 1438   ALKPHOS 181 (H) 04/11/2024 1739   ALKPHOS 175 (H) 04/10/2024 1420   ALKPHOS 90 02/04/2017 1438   BILITOT 0.8 04/11/2024 1739   BILITOT 0.6 04/10/2024 1420   BILITOT <0.2 03/23/2023 1602   BILITOT 0.3 07/05/2018 1401   BILITOT <0.22 02/04/2017 1438   PROT 7.3 04/11/2024 1739   PROT 7.3 04/10/2024 1420   PROT 6.7 03/23/2023 1602   PROT 6.7 07/05/2018 1401   PROT 6.8  02/04/2017 1438   ALBUMIN  4.1 04/11/2024 1739   ALBUMIN  3.4 (L) 04/10/2024 1420   ALBUMIN  4.4 03/23/2023 1602   ALBUMIN  4.2 07/05/2018 1401   ALBUMIN  3.8 02/04/2017 1438    Studies/Results: No results found.    Erin Bradford 04/15/2024  Patient ID: Erin Bradford, female   DOB: 1971/05/27, 53 y.o.   MRN: [id number]

## 2024-04-15 NOTE — Progress Notes (Signed)
 Dear Doctor: Sofia Dunn,  This patient has been identified as a candidate for PICC for the following reason (s): poor veins/poor circulatory system (CHF, COPD, emphysema, diabetes, steroid use, IV drug abuse, etc.) If you agree, please write an order for the indicated device. For any questions contact the Vascular Access Team at [phone] if no answer, please leave a message.  Thank you for supporting the early vascular access assessment program.

## 2024-04-15 NOTE — Plan of Care (Signed)

## 2024-04-16 LAB — CBC WITH DIFFERENTIAL/PLATELET
Abs Immature Granulocytes: 0.41 10*3/uL — ABNORMAL HIGH (ref 0.00–0.07)
Basophils Absolute: 0.1 10*3/uL (ref 0.0–0.1)
Basophils Relative: 1 %
Eosinophils Absolute: 0.6 10*3/uL — ABNORMAL HIGH (ref 0.0–0.5)
Eosinophils Relative: 3 %
HCT: 37.5 % (ref 36.0–46.0)
Hemoglobin: 12.4 g/dL (ref 12.0–15.0)
Immature Granulocytes: 2 %
Lymphocytes Relative: 13 %
Lymphs Abs: 2.7 10*3/uL (ref 0.7–4.0)
MCH: 29.8 pg (ref 26.0–34.0)
MCHC: 33.1 g/dL (ref 30.0–36.0)
MCV: 90.1 fL (ref 80.0–100.0)
Monocytes Absolute: 1.9 10*3/uL — ABNORMAL HIGH (ref 0.1–1.0)
Monocytes Relative: 9 %
Neutro Abs: 14.9 10*3/uL — ABNORMAL HIGH (ref 1.7–7.7)
Neutrophils Relative %: 72 %
Platelets: 654 10*3/uL — ABNORMAL HIGH (ref 150–400)
RBC: 4.16 MIL/uL (ref 3.87–5.11)
RDW: 13 % (ref 11.5–15.5)
WBC: 20.5 10*3/uL — ABNORMAL HIGH (ref 4.0–10.5)
nRBC: 0.1 % (ref 0.0–0.2)

## 2024-04-16 LAB — BASIC METABOLIC PANEL WITH GFR
Anion gap: 10 (ref 5–15)
BUN: 11 mg/dL (ref 6–20)
CO2: 32 mmol/L (ref 22–32)
Calcium: 9.5 mg/dL (ref 8.9–10.3)
Chloride: 98 mmol/L (ref 98–111)
Creatinine, Ser: 0.79 mg/dL (ref 0.44–1.00)
GFR, Estimated: >60 mL/min (ref >60)
Glucose, Bld: 138 mg/dL — ABNORMAL HIGH (ref 70–99)
Potassium: 4 mmol/L (ref 3.5–5.1)
Sodium: 140 mmol/L (ref 135–145)

## 2024-04-16 MED ORDER — GABAPENTIN 300 MG PO CAPS
600.0000 mg | ORAL_CAPSULE | Freq: Three times a day (TID) | ORAL | Status: DC
  Administered 2024-04-16 – 2024-04-24 (×26): 600 mg via ORAL
  Filled 2024-04-16 (×26): qty 2

## 2024-04-16 MED ORDER — KCL IN DEXTROSE-NACL 20-5-0.45 MEQ/L-%-% IV SOLN
INTRAVENOUS | Status: AC
  Filled 2024-04-16 (×2): qty 1000

## 2024-04-16 MED ORDER — CHLORHEXIDINE GLUCONATE CLOTH 2 % EX PADS
6.0000 | MEDICATED_PAD | Freq: Every day | CUTANEOUS | Status: DC
  Administered 2024-04-16 – 2024-04-24 (×9): 6 via TOPICAL

## 2024-04-16 MED ORDER — SODIUM CHLORIDE 0.9% FLUSH
10.0000 mL | Freq: Two times a day (BID) | INTRAVENOUS | Status: DC
  Administered 2024-04-16 – 2024-04-21 (×11): 10 mL
  Administered 2024-04-22: 40 mL
  Administered 2024-04-22: 10 mL
  Administered 2024-04-23: 20 mL
  Administered 2024-04-24: 10 mL

## 2024-04-16 MED ORDER — POTASSIUM CHLORIDE 2 MEQ/ML IV SOLN
INTRAVENOUS | Status: DC
  Filled 2024-04-16: qty 1000

## 2024-04-16 MED ORDER — METHOCARBAMOL 1000 MG/10ML IJ SOLN
500.0000 mg | Freq: Four times a day (QID) | INTRAMUSCULAR | Status: DC
  Administered 2024-04-16 – 2024-04-23 (×5): 500 mg via INTRAVENOUS
  Filled 2024-04-16 (×7): qty 10

## 2024-04-16 MED ORDER — METHOCARBAMOL 500 MG PO TABS
1000.0000 mg | ORAL_TABLET | Freq: Four times a day (QID) | ORAL | Status: DC
  Administered 2024-04-17 – 2024-04-24 (×28): 1000 mg via ORAL
  Filled 2024-04-16 (×30): qty 2

## 2024-04-16 MED ORDER — SODIUM CHLORIDE 0.9% FLUSH: 10.0000 mL | INTRAVENOUS | Status: DC | PRN

## 2024-04-16 MED ORDER — MOMETASONE FURO-FORMOTEROL FUM 100-5 MCG/ACT IN AERO
2.0000 | INHALATION_SPRAY | Freq: Two times a day (BID) | RESPIRATORY_TRACT | Status: DC
  Administered 2024-04-16 – 2024-04-24 (×16): 2 via RESPIRATORY_TRACT
  Filled 2024-04-16: qty 8.8

## 2024-04-16 MED ORDER — SERTRALINE HCL 100 MG PO TABS
100.0000 mg | ORAL_TABLET | Freq: Every day | ORAL | Status: DC
Start: 2024-04-16 — End: 2024-04-24
  Administered 2024-04-16 – 2024-04-24 (×9): 100 mg via ORAL
  Filled 2024-04-16 (×9): qty 1

## 2024-04-16 NOTE — Progress Notes (Signed)
 Loetta Ringer, PA called this nurse to notified that pt will be place on a wound vac today and that she had ben trying to call the pt and there was no answer.   Pt was informed that the PA wants to place a wound vac today. Then had to inform the pt that the wound vac will not be place today as plan but will be place on 04/17/24. Supplies has been order.

## 2024-04-16 NOTE — Plan of Care (Signed)
  Problem: Activity: Goal: Risk for activity intolerance will decrease Outcome: Progressing   Problem: Coping: Goal: Level of anxiety will decrease Outcome: Progressing   Problem: Elimination: Goal: Will not experience complications related to urinary retention Outcome: Progressing   Problem: Pain Managment: Goal: General experience of comfort will improve and/or be controlled Outcome: Progressing

## 2024-04-16 NOTE — TOC CM/SW Note (Signed)
    Durable Medical Equipment  (From admission, onward)           Start     Ordered   04/16/24 1407  For home use only DME 3 n 1  Once        04/16/24 1406             Patient requires a 3 in 1 / bedside commode due to being confined to a room with no bathroom

## 2024-04-16 NOTE — Care Management Important Message (Signed)
 Important Message  Patient Details  Name: Erin Bradford MRN: [id number] Date of Birth: 11-28-1971   Important Message Given:  Yes - Medicare IM     Felix Host 04/16/2024, 4:28 PM
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
## 2024-04-16 NOTE — Progress Notes (Signed)
 Peripherally Inserted Central Catheter Placement  The IV Nurse has discussed with the patient and/or persons authorized to consent for the patient, the purpose of this procedure and the potential benefits and risks involved with this procedure.  The benefits include less needle sticks, lab draws from the catheter, and the patient may be discharged home with the catheter. Risks include, but not limited to, infection, bleeding, blood clot (thrombus formation), and puncture of an artery; nerve damage and irregular heartbeat and possibility to perform a PICC exchange if needed/ordered by physician.  Alternatives to this procedure were also discussed.  Bard Power PICC patient education guide, fact sheet on infection prevention and patient information card has been provided to patient /or left at bedside.    PICC Placement Documentation  PICC Double Lumen 04/16/24 Right Brachial 43 cm 2 cm (Active)  Indication for Insertion or Continuance of Line Poor Vasculature-patient has had multiple peripheral attempts or PIVs lasting less than 24 hours 04/16/24 0800  Exposed Catheter (cm) 2 cm 04/16/24 0800  Site Assessment Clean, Dry, Intact 04/16/24 0800  Lumen #1 Status Flushed;Saline locked;Blood return noted 04/16/24 0800  Lumen #2 Status Flushed;Saline locked;Blood return noted 04/16/24 0800  Dressing Type Transparent;Securing device 04/16/24 0800  Dressing Status Antimicrobial disc/dressing in place;Clean, Dry, Intact 04/16/24 0800  Line Care Connections checked and tightened 04/16/24 0800  Line Adjustment (NICU/IV Team Only) No 04/16/24 0800  Dressing Intervention New dressing;Adhesive placed at insertion site (IV team only) 04/16/24 0800  Dressing Change Due 04/23/24 04/16/24 0800       Roseanne Cones Renee 04/16/2024, 8:58 AM

## 2024-04-16 NOTE — Progress Notes (Signed)
 Mobility Specialist Progress Note:   04/16/24 1342  Mobility  Activity Ambulated with assistance in hallway  Level of Assistance Minimal assist, patient does 75% or more  Assistive Device Front wheel walker  Distance Ambulated (ft) 175 ft  Activity Response Tolerated well  Mobility Referral Yes  Mobility visit 1 Mobility  Mobility Specialist Start Time (ACUTE ONLY) 1215  Mobility Specialist Stop Time (ACUTE ONLY) 1235  Mobility Specialist Time Calculation (min) (ACUTE ONLY) 20 min   Pt received in bed, agreeable to mobility. MinA to log roll to EOB. SB during ambulation. Pt c/o nausea and abdominal discomfort, otherwise asx throughout. Pt left in chair with call bell in reach and all needs met.   Erin Bradford  Mobility Specialist Please contact via [employer] office at [phone]

## 2024-04-16 NOTE — Progress Notes (Signed)
 4 Days Post-Op  Subjective: Having pain control issues at times.  Still with nausea, but no vomiting in about 36 hrs.  Walked twice yesterday for the first time.  Passing some flatus, but no BM.  Voiding.  Just got picc placed.  ROS: See above, otherwise other systems negative  Objective: Vital signs in last 24 hours: Temp:  [98.4 F (36.9 C)-98.9 F (37.2 C)] 98.4 F (36.9 C) (04/21 0419) Pulse Rate:  [63-73] 63 (04/21 0419) Resp:  [17-20] 17 (04/21 0419) BP: (147-174)/(72-109) 147/81 (04/21 0419) SpO2:  [97 %-100 %] 97 % (04/21 0419) Last BM Date : 04/05/24  Intake/Output from previous day: 04/20 0701 - 04/21 0700 In: 0  Out: 225 [Urine:225] Intake/Output this shift: No intake/output data recorded.  PE: Heart: regular Lungs: CTAB Abd: soft, obese, some distention, especially in upper abdomen, midline wound is clean and packed, good granulation tissue, absent BS  Lab Results:  Recent Labs    04/16/24 0657  WBC 20.5*  HGB 12.4  HCT 37.5  PLT 654*   BMET Recent Labs    04/16/24 0657  NA 140  K 4.0  CL 98  CO2 32  GLUCOSE 138*  BUN 11  CREATININE 0.79  CALCIUM  9.5   PT/INR No results for input(s): "LABPROT", "INR" in the last 72 hours. CMP     Component Value Date/Time   NA 140 04/16/2024 0657   NA 139 03/23/2023 1602   NA 138 02/04/2017 1438   K 4.0 04/16/2024 0657   K 3.8 02/04/2017 1438   CL 98 04/16/2024 0657   CO2 32 04/16/2024 0657   CO2 26 02/04/2017 1438   GLUCOSE 138 (H) 04/16/2024 0657   GLUCOSE 85 02/04/2017 1438   BUN 11 04/16/2024 0657   BUN 5 (L) 03/23/2023 1602   BUN 8.9 02/04/2017 1438   CREATININE 0.79 04/16/2024 0657   CREATININE 1.0 02/04/2017 1438   CALCIUM  9.5 04/16/2024 0657   CALCIUM  9.2 02/04/2017 1438   PROT 7.3 04/11/2024 1739   PROT 6.7 03/23/2023 1602   PROT 6.8 02/04/2017 1438   ALBUMIN  4.1 04/11/2024 1739   ALBUMIN  4.4 03/23/2023 1602   ALBUMIN  3.8 02/04/2017 1438   AST 22 04/11/2024 1739   AST 17  02/04/2017 1438   ALT 34 04/11/2024 1739   ALT 20 02/04/2017 1438   ALKPHOS 181 (H) 04/11/2024 1739   ALKPHOS 90 02/04/2017 1438   BILITOT 0.8 04/11/2024 1739   BILITOT <0.2 03/23/2023 1602   BILITOT <0.22 02/04/2017 1438   GFRNONAA >60 04/16/2024 0657   GFRAA >60 07/19/2018 2238   Lipase     Component Value Date/Time   LIPASE 20 04/10/2024 1420       Studies/Results: US  EKG SITE RITE Result Date: 04/15/2024 If Site Rite image not attached, placement could not be confirmed due to current cardiac rhythm.   Anti-infectives: Anti-infectives (From admission, onward)    Start     Dose/Rate Route Frequency Ordered Stop   04/15/24 2345  metroNIDAZOLE  (FLAGYL ) tablet 500 mg       Note to Pharmacy: 1x dose--lost IV access and difficulty obtaining. To replace her 10pm IV dose while awaiting PICC   500 mg Oral Once 04/15/24 2247 04/15/24 2322   04/13/24 1730  cefTRIAXone  (ROCEPHIN ) 2 g in sodium chloride  0.9 % 100 mL IVPB       Placed in "And" Linked Group   2 g 200 mL/hr over 30 Minutes Intravenous Every 24 hours 04/12/24 2003 04/18/24  1729   04/12/24 2200  metroNIDAZOLE  (FLAGYL ) IVPB 500 mg       Placed in "And" Linked Group   500 mg 100 mL/hr over 60 Minutes Intravenous Every 12 hours 04/12/24 2003 04/18/24 2159   04/12/24 0313  cefTRIAXone  (ROCEPHIN ) 2 g in sodium chloride  0.9 % 100 mL IVPB  Status:  Discontinued       Placed in "And" Linked Group   2 g 200 mL/hr over 30 Minutes Intravenous Every 24 hours 04/12/24 0313 04/12/24 1951   04/12/24 0313  metroNIDAZOLE  (FLAGYL ) IVPB 500 mg  Status:  Discontinued       Placed in "And" Linked Group   500 mg 100 mL/hr over 60 Minutes Intravenous Every 12 hours 04/12/24 0313 04/12/24 1951   04/11/24 2115  metroNIDAZOLE  (FLAGYL ) IVPB 500 mg        500 mg 100 mL/hr over 60 Minutes Intravenous  Once 04/11/24 2108 04/11/24 2210   04/11/24 2045  cefTRIAXone  (ROCEPHIN ) 2 g in sodium chloride  0.9 % 100 mL IVPB        2 g 200 mL/hr over  30 Minutes Intravenous  Once 04/11/24 2034 04/11/24 2110        Assessment/Plan POD 4, s/p lap converted to open ileocecectomy for perforated appendicitis, Dr. Alethea Andes 4/17 -cont abx therapy, Rocephin /Flagyl  -WBC down to 20K today, recheck in am.  All other labs look good. -restart some IVFs given ileus and minimal oral intake -no vomiting, but persistent nausea and some abdominal distention.  Hold on NGT, but if starts vomiting, she may need this -mobilize -pulm toilet -multi-modal pain control -BID dressing changes to midline wound, could benefit from [facility] -some of her home meds for her asthma were re-ordered.   FEN - CLD/sips of clears/IVFs VTE - lovenox  ID - rocephin /Flagyl     LOS: 4 days    Leone Ralphs , [facility] 04/16/2024, 9:49 AM Please see Amion for pager number during day hours 7:00am-4:30pm or 7:00am -11:30am on weekends

## 2024-04-16 NOTE — Consult Note (Signed)
 WOC consulted for NPWT to the midline, no WOC nurse on the [facility] campus after 12 today, notified [facility] PA, orders updated. Plans to place tomorrow 04/17/24. Requested dressings and canister to be ordered. Will ask machine to be ordered as well. Added to [facility] nursing follow up list.   Stepfon Rawles [facility], CNS, CWON-AP [phone]

## 2024-04-17 LAB — CBC
HCT: 33.9 % — ABNORMAL LOW (ref 36.0–46.0)
Hemoglobin: 10.8 g/dL — ABNORMAL LOW (ref 12.0–15.0)
MCH: 29.3 pg (ref 26.0–34.0)
MCHC: 31.9 g/dL (ref 30.0–36.0)
MCV: 91.9 fL (ref 80.0–100.0)
Platelets: 583 10*3/uL — ABNORMAL HIGH (ref 150–400)
RBC: 3.69 MIL/uL — ABNORMAL LOW (ref 3.87–5.11)
RDW: 13 % (ref 11.5–15.5)
WBC: 20.3 10*3/uL — ABNORMAL HIGH (ref 4.0–10.5)
nRBC: 0 % (ref 0.0–0.2)

## 2024-04-17 LAB — CULTURE, BLOOD (ROUTINE X 2)
Culture: NO GROWTH
Culture: NO GROWTH

## 2024-04-17 MED ORDER — OXYCODONE HCL 5 MG PO TABS
10.0000 mg | ORAL_TABLET | ORAL | Status: DC | PRN
  Administered 2024-04-17: 10 mg via ORAL
  Administered 2024-04-18 (×3): 15 mg via ORAL
  Administered 2024-04-19: 10 mg via ORAL
  Administered 2024-04-19 – 2024-04-24 (×20): 15 mg via ORAL
  Filled 2024-04-17 (×8): qty 3
  Filled 2024-04-17: qty 2
  Filled 2024-04-17 (×12): qty 3
  Filled 2024-04-17: qty 2
  Filled 2024-04-17 (×2): qty 3

## 2024-04-17 MED ORDER — BOOST / RESOURCE BREEZE PO LIQD CUSTOM
1.0000 | Freq: Two times a day (BID) | ORAL | Status: DC
  Administered 2024-04-17 – 2024-04-22 (×9): 1 via ORAL

## 2024-04-17 MED ORDER — HYDROMORPHONE HCL 1 MG/ML IJ SOLN
0.5000 mg | INTRAMUSCULAR | Status: DC | PRN
  Administered 2024-04-17 – 2024-04-19 (×7): 1 mg via INTRAVENOUS
  Filled 2024-04-17 (×7): qty 1

## 2024-04-17 NOTE — Progress Notes (Signed)
 Mobility Specialist Progress Note:   04/17/24 1540  Mobility  Activity Ambulated with assistance in hallway  Level of Assistance Standby assist, set-up cues, supervision of patient - no hands on  Assistive Device Front wheel walker  Distance Ambulated (ft) 300 ft  Activity Response Tolerated well  Mobility Referral Yes  Mobility visit 1 Mobility  Mobility Specialist Start Time (ACUTE ONLY) 1420  Mobility Specialist Stop Time (ACUTE ONLY) 1431  Mobility Specialist Time Calculation (min) (ACUTE ONLY) 11 min   Pt received in chair, agreeable to mobility. No physical assist required during ambulation. Pt denied any discomfort during ambulation, asx throughout. Pt left in chair with NT present in room.   Sofia Dunn  Mobility Specialist Please contact via [employer] office at [phone]

## 2024-04-17 NOTE — Plan of Care (Signed)
  Problem: Nutrition: Goal: Adequate nutrition will be maintained Outcome: Progressing   Problem: Coping: Goal: Level of anxiety will decrease Outcome: Progressing   Problem: Safety: Goal: Ability to remain free from injury will improve Outcome: Progressing   Problem: Skin Integrity: Goal: Risk for impaired skin integrity will decrease Outcome: Progressing   Problem: Pain Managment: Goal: General experience of comfort will improve and/or be controlled Outcome: Not Progressing

## 2024-04-17 NOTE — Consult Note (Addendum)
 WOC Nurse Consult Note: Reason for Consult: Apply VAC on abdominal wound. Wound type: Surgical. Pressure Injury POA: NA Measurement: 15 x 5.5 x 4 cm Wound bed: 80% pale pink, 20% fat tissue. Drainage (amount, consistency, odor) Minimum amount, no odor, serous. Periwound: intact Dressing procedure/placement/frequency: Removed old NPWT dressing Cleansed wound with normal saline Periwound skin protected with skin barrier wipe.  Filled wound with 1 piece of black foam  Sealed NPWT dressing at HG/147mmHG  Patient received IV/PO pain medication per bedside nurse prior to dressing change Patient tolerated procedure well  WOC nurse will continue to provide NPWT dressing changed due to the complexity of the dressing change.    WOC team will follow on FRI.  Please reconsult if further assistance is needed. Thank-you,  Rachel Budds BSN, RN, [employer], WOC  (Pager: [phone])

## 2024-04-17 NOTE — Progress Notes (Addendum)
 5 Days Post-Op  Subjective: Some nausea still yesterday, controlled with anti-emetics, but was able to take in more CLD yesterday.  Feels like she needs to have a BM.  Passing flatus.  Did ambulate yesterday and sit in a chair.  ROS: See above, otherwise other systems negative  Objective: Vital signs in last 24 hours: Temp:  [97.7 F (36.5 C)-99 F (37.2 C)] 97.7 F (36.5 C) (04/22 0452) Pulse Rate:  [62-96] 62 (04/22 0452) Resp:  [16-18] 17 (04/22 0452) BP: (133-170)/(81-100) 133/81 (04/22 0452) SpO2:  [96 %-100 %] 98 % (04/22 0452) Last BM Date : 04/05/24  Intake/Output from previous day: 04/21 0701 - 04/22 0700 In: 1590 [P.O.:150; I.V.:740; IV Piggyback:700] Out: 240 [Drains:240] Intake/Output this shift: No intake/output data recorded.  PE: Heart: regular Lungs: CTAB Abd: soft, obese, less upper abdominal distention today, midline wound is clean and packed, good granulation tissue.  JP with serous fluid, 250cc documented  Lab Results:  Recent Labs    04/16/24 0657 04/17/24 0412  WBC 20.5* 20.3*  HGB 12.4 10.8*  HCT 37.5 33.9*  PLT 654* 583*   BMET Recent Labs    04/16/24 0657  NA 140  K 4.0  CL 98  CO2 32  GLUCOSE 138*  BUN 11  CREATININE 0.79  CALCIUM  9.5   PT/INR No results for input(s): "LABPROT", "INR" in the last 72 hours. CMP     Component Value Date/Time   NA 140 04/16/2024 0657   NA 139 03/23/2023 1602   NA 138 02/04/2017 1438   K 4.0 04/16/2024 0657   K 3.8 02/04/2017 1438   CL 98 04/16/2024 0657   CO2 32 04/16/2024 0657   CO2 26 02/04/2017 1438   GLUCOSE 138 (H) 04/16/2024 0657   GLUCOSE 85 02/04/2017 1438   BUN 11 04/16/2024 0657   BUN 5 (L) 03/23/2023 1602   BUN 8.9 02/04/2017 1438   CREATININE 0.79 04/16/2024 0657   CREATININE 1.0 02/04/2017 1438   CALCIUM  9.5 04/16/2024 0657   CALCIUM  9.2 02/04/2017 1438   PROT 7.3 04/11/2024 1739   PROT 6.7 03/23/2023 1602   PROT 6.8 02/04/2017 1438   ALBUMIN  4.1 04/11/2024 1739    ALBUMIN  4.4 03/23/2023 1602   ALBUMIN  3.8 02/04/2017 1438   AST 22 04/11/2024 1739   AST 17 02/04/2017 1438   ALT 34 04/11/2024 1739   ALT 20 02/04/2017 1438   ALKPHOS 181 (H) 04/11/2024 1739   ALKPHOS 90 02/04/2017 1438   BILITOT 0.8 04/11/2024 1739   BILITOT <0.2 03/23/2023 1602   BILITOT <0.22 02/04/2017 1438   GFRNONAA >60 04/16/2024 0657   GFRAA >60 07/19/2018 2238   Lipase     Component Value Date/Time   LIPASE 20 04/10/2024 1420       Studies/Results: US  EKG SITE RITE Result Date: 04/15/2024 If Site Rite image not attached, placement could not be confirmed due to current cardiac rhythm.   Anti-infectives: Anti-infectives (From admission, onward)    Start     Dose/Rate Route Frequency Ordered Stop   04/15/24 2345  metroNIDAZOLE  (FLAGYL ) tablet 500 mg       Note to Pharmacy: 1x dose--lost IV access and difficulty obtaining. To replace her 10pm IV dose while awaiting PICC   500 mg Oral Once 04/15/24 2247 04/15/24 2322   04/13/24 1730  cefTRIAXone  (ROCEPHIN ) 2 g in sodium chloride  0.9 % 100 mL IVPB       Placed in "And" Linked Group   2 g 200  mL/hr over 30 Minutes Intravenous Every 24 hours 04/12/24 2003 04/18/24 1729   04/12/24 2200  metroNIDAZOLE  (FLAGYL ) IVPB 500 mg       Placed in "And" Linked Group   500 mg 100 mL/hr over 60 Minutes Intravenous Every 12 hours 04/12/24 2003 04/18/24 2159   04/12/24 0313  cefTRIAXone  (ROCEPHIN ) 2 g in sodium chloride  0.9 % 100 mL IVPB  Status:  Discontinued       Placed in "And" Linked Group   2 g 200 mL/hr over 30 Minutes Intravenous Every 24 hours 04/12/24 0313 04/12/24 1951   04/12/24 0313  metroNIDAZOLE  (FLAGYL ) IVPB 500 mg  Status:  Discontinued       Placed in "And" Linked Group   500 mg 100 mL/hr over 60 Minutes Intravenous Every 12 hours 04/12/24 0313 04/12/24 1951   04/11/24 2115  metroNIDAZOLE  (FLAGYL ) IVPB 500 mg        500 mg 100 mL/hr over 60 Minutes Intravenous  Once 04/11/24 2108 04/11/24 2210   04/11/24 2045   cefTRIAXone  (ROCEPHIN ) 2 g in sodium chloride  0.9 % 100 mL IVPB        2 g 200 mL/hr over 30 Minutes Intravenous  Once 04/11/24 2034 04/11/24 2110        Assessment/Plan POD 5, s/p lap converted to open ileocecectomy for perforated appendicitis, Dr. Alethea Andes 4/17 -cont abx therapy, Rocephin /Flagyl  -WBC stable at 20K today, recheck in am.  -patient passing flatus and having less nausea and just starting to move her bowels.  Will try FLD today and see how she does with this. -cont JP drain -add resource breeze -mobilize -pulm toilet -multi-modal pain control -VAC to be placed today, T/Fri change  FEN - FLD, Breeze VTE - lovenox  ID - rocephin /Flagyl     LOS: 5 days    Leone Ralphs , [facility] Surgery 04/17/2024, 8:01 AM Please see Amion for pager number during day hours 7:00am-4:30pm or 7:00am -11:30am on weekends

## 2024-04-17 NOTE — Plan of Care (Signed)
  Problem: Education: Goal: Knowledge of General Education information will improve Description: Including pain rating scale, medication(s)/side effects and non-pharmacologic comfort measures Outcome: Progressing   Problem: Health Behavior/Discharge Planning: Goal: Ability to manage health-related needs will improve Outcome: Progressing   Problem: Clinical Measurements: Goal: Ability to maintain clinical measurements within normal limits will improve Outcome: Progressing Goal: Diagnostic test results will improve Outcome: Progressing Goal: Respiratory complications will improve Outcome: Progressing Goal: Cardiovascular complication will be avoided Outcome: Progressing   Problem: Activity: Goal: Risk for activity intolerance will decrease Outcome: Progressing   Problem: Coping: Goal: Level of anxiety will decrease Outcome: Progressing   Problem: Nutrition: Goal: Adequate nutrition will be maintained Outcome: Not Progressing

## 2024-04-18 ENCOUNTER — Inpatient Hospital Stay (HOSPITAL_COMMUNITY)

## 2024-04-18 LAB — CBC
HCT: 33.8 % — ABNORMAL LOW (ref 36.0–46.0)
Hemoglobin: 10.7 g/dL — ABNORMAL LOW (ref 12.0–15.0)
MCH: 29.1 pg (ref 26.0–34.0)
MCHC: 31.7 g/dL (ref 30.0–36.0)
MCV: 91.8 fL (ref 80.0–100.0)
Platelets: 529 10*3/uL — ABNORMAL HIGH (ref 150–400)
RBC: 3.68 MIL/uL — ABNORMAL LOW (ref 3.87–5.11)
RDW: 13.1 % (ref 11.5–15.5)
WBC: 22.3 10*3/uL — ABNORMAL HIGH (ref 4.0–10.5)
nRBC: 0 % (ref 0.0–0.2)

## 2024-04-18 MED ORDER — IOHEXOL 9 MG/ML PO SOLN
500.0000 mL | ORAL | Status: AC
  Administered 2024-04-18 (×2): 500 mL via ORAL

## 2024-04-18 MED ORDER — IOHEXOL 350 MG/ML SOLN
75.0000 mL | Freq: Once | INTRAVENOUS | Status: AC | PRN
  Administered 2024-04-18: 75 mL via INTRAVENOUS

## 2024-04-18 NOTE — Progress Notes (Signed)
 CT Imaging Gaetana Jones) and Gen sx Sharen Daubs) sent msg. Patient was only able to drink a bottle & 1/2 of contrast. patient states she cannot drink any more.

## 2024-04-18 NOTE — Plan of Care (Signed)

## 2024-04-18 NOTE — Progress Notes (Signed)
 Mobility Specialist Progress Note:   04/18/24 1105  Mobility  Activity Ambulated with assistance in hallway  Level of Assistance Standby assist, set-up cues, supervision of patient - no hands on  Assistive Device Front wheel walker  Distance Ambulated (ft) 250 ft  Activity Response Tolerated well  Mobility Referral Yes  Mobility visit 1 Mobility  Mobility Specialist Start Time (ACUTE ONLY) 0955  Mobility Specialist Stop Time (ACUTE ONLY) 1010  Mobility Specialist Time Calculation (min) (ACUTE ONLY) 15 min   Pt received in chair, agreeable to mobility. No physical assist required during session. Pt c/o slight dizziness requiring standing rest break but stated this resolved towards EOS. VSS. Pt returned to bed with call bell in reach and all needs met.   Sofia Dunn  Mobility Specialist Please contact via [employer] office at [phone]

## 2024-04-18 NOTE — Progress Notes (Signed)
 6 Days Post-Op  Subjective: Nausea much better and pain overall stable - some more pain at incision that started after wound vac placement. Tolerating fulls. Having several bowel movements (4-5 yesterday and 4 today). Ambulating.   Objective: Vital signs in last 24 hours: Temp:  [97.5 F (36.4 C)-98.2 F (36.8 C)] 97.5 F (36.4 C) (04/23 0718) Pulse Rate:  [55-75] 69 (04/23 0718) Resp:  [16-18] 16 (04/23 0718) BP: (105-156)/(68-94) 135/77 (04/23 0718) SpO2:  [95 %-100 %] 100 % (04/23 0718) Last BM Date : 04/18/24  Intake/Output from previous day: 04/22 0701 - 04/23 0700 In: 790 [P.O.:480; I.V.:10; IV Piggyback:300] Out: 0  Intake/Output this shift: No intake/output data recorded.  PE: Heart: regular Lungs: CTAB Abd: soft, obese, midline wound with vac in place with good suction and serous output. Lap incision incorporated in vac dressing with staple intact and no erythema.  JP with serous fluid - 0cc documented last 24h  Lab Results:  Recent Labs    04/17/24 0412 04/18/24 0139  WBC 20.3* 22.3*  HGB 10.8* 10.7*  HCT 33.9* 33.8*  PLT 583* 529*   BMET Recent Labs    04/16/24 0657  NA 140  K 4.0  CL 98  CO2 32  GLUCOSE 138*  BUN 11  CREATININE 0.79  CALCIUM  9.5   PT/INR No results for input(s): "LABPROT", "INR" in the last 72 hours. CMP     Component Value Date/Time   NA 140 04/16/2024 0657   NA 139 03/23/2023 1602   NA 138 02/04/2017 1438   K 4.0 04/16/2024 0657   K 3.8 02/04/2017 1438   CL 98 04/16/2024 0657   CO2 32 04/16/2024 0657   CO2 26 02/04/2017 1438   GLUCOSE 138 (H) 04/16/2024 0657   GLUCOSE 85 02/04/2017 1438   BUN 11 04/16/2024 0657   BUN 5 (L) 03/23/2023 1602   BUN 8.9 02/04/2017 1438   CREATININE 0.79 04/16/2024 0657   CREATININE 1.0 02/04/2017 1438   CALCIUM  9.5 04/16/2024 0657   CALCIUM  9.2 02/04/2017 1438   PROT 7.3 04/11/2024 1739   PROT 6.7 03/23/2023 1602   PROT 6.8 02/04/2017 1438   ALBUMIN  4.1 04/11/2024 1739    ALBUMIN  4.4 03/23/2023 1602   ALBUMIN  3.8 02/04/2017 1438   AST 22 04/11/2024 1739   AST 17 02/04/2017 1438   ALT 34 04/11/2024 1739   ALT 20 02/04/2017 1438   ALKPHOS 181 (H) 04/11/2024 1739   ALKPHOS 90 02/04/2017 1438   BILITOT 0.8 04/11/2024 1739   BILITOT <0.2 03/23/2023 1602   BILITOT <0.22 02/04/2017 1438   GFRNONAA >60 04/16/2024 0657   GFRAA >60 07/19/2018 2238   Lipase     Component Value Date/Time   LIPASE 20 04/10/2024 1420       Studies/Results: No results found.   Anti-infectives: Anti-infectives (From admission, onward)    Start     Dose/Rate Route Frequency Ordered Stop   04/15/24 2345  metroNIDAZOLE  (FLAGYL ) tablet 500 mg       Note to Pharmacy: 1x dose--lost IV access and difficulty obtaining. To replace her 10pm IV dose while awaiting PICC   500 mg Oral Once 04/15/24 2247 04/15/24 2322   04/13/24 1730  cefTRIAXone  (ROCEPHIN ) 2 g in sodium chloride  0.9 % 100 mL IVPB       Placed in "And" Linked Group   2 g 200 mL/hr over 30 Minutes Intravenous Every 24 hours 04/12/24 2003 04/17/24 1735   04/12/24 2200  metroNIDAZOLE  (FLAGYL ) IVPB  500 mg       Placed in "And" Linked Group   500 mg 100 mL/hr over 60 Minutes Intravenous Every 12 hours 04/12/24 2003 04/18/24 2159   04/12/24 0313  cefTRIAXone  (ROCEPHIN ) 2 g in sodium chloride  0.9 % 100 mL IVPB  Status:  Discontinued       Placed in "And" Linked Group   2 g 200 mL/hr over 30 Minutes Intravenous Every 24 hours 04/12/24 0313 04/12/24 1951   04/12/24 0313  metroNIDAZOLE  (FLAGYL ) IVPB 500 mg  Status:  Discontinued       Placed in "And" Linked Group   500 mg 100 mL/hr over 60 Minutes Intravenous Every 12 hours 04/12/24 0313 04/12/24 1951   04/11/24 2115  metroNIDAZOLE  (FLAGYL ) IVPB 500 mg        500 mg 100 mL/hr over 60 Minutes Intravenous  Once 04/11/24 2108 04/11/24 2210   04/11/24 2045  cefTRIAXone  (ROCEPHIN ) 2 g in sodium chloride  0.9 % 100 mL IVPB        2 g 200 mL/hr over 30 Minutes Intravenous   Once 04/11/24 2034 04/11/24 2110        Assessment/Plan POD 6, s/p lap converted to open ileocecectomy for perforated appendicitis, Dr. Alethea Andes 4/17 -abx therapy Rocephin /Flagyl  to stop today - further abx plan pending -WBC up slightly to 22.3K from 20.3K.  - now having Bms and tolerating FLD -cont JP drain -mobilize -pulm toilet -multi-modal pain control -VAC T/Fri change  Given rising WBC will get CT today to evaluate for intraabdominal abscess. Continue FLD for now  FEN - FLD, Breeze (lactose intolerance) VTE - lovenox  ID - rocephin /Flagyl     LOS: 6 days    Elwin Hammond , [facility] Surgery 04/18/2024, 8:05 AM Please see Amion for pager number during day hours 7:00am-4:30pm or 7:00am -11:30am on weekends

## 2024-04-18 NOTE — Progress Notes (Signed)
 Mobility Specialist Progress Note:   04/18/24 1554  Mobility  Activity Ambulated with assistance in hallway  Level of Assistance Standby assist, set-up cues, supervision of patient - no hands on  Assistive Device Front wheel walker  Distance Ambulated (ft) 275 ft  Activity Response Tolerated well  Mobility Referral Yes  Mobility visit 1 Mobility  Mobility Specialist Start Time (ACUTE ONLY) 1530  Mobility Specialist Stop Time (ACUTE ONLY) 1545  Mobility Specialist Time Calculation (min) (ACUTE ONLY) 15 min   Pt received in chair, agreeable to mobility. C/o slight abdominal pain, otherwise asx throughout. No physical assist required. Pt returned to bed with call bell in reach and all needs met.   Sofia Dunn  Mobility Specialist Please contact via [employer] office at [phone]

## 2024-04-19 LAB — CBC
HCT: 30.4 % — ABNORMAL LOW (ref 36.0–46.0)
Hemoglobin: 9.7 g/dL — ABNORMAL LOW (ref 12.0–15.0)
MCH: 29.4 pg (ref 26.0–34.0)
MCHC: 31.9 g/dL (ref 30.0–36.0)
MCV: 92.1 fL (ref 80.0–100.0)
Platelets: 486 10*3/uL — ABNORMAL HIGH (ref 150–400)
RBC: 3.3 MIL/uL — ABNORMAL LOW (ref 3.87–5.11)
RDW: 13.2 % (ref 11.5–15.5)
WBC: 20.7 10*3/uL — ABNORMAL HIGH (ref 4.0–10.5)
nRBC: 0.1 % (ref 0.0–0.2)

## 2024-04-19 LAB — BASIC METABOLIC PANEL WITH GFR
Anion gap: 9 (ref 5–15)
BUN: <5 mg/dL — ABNORMAL LOW (ref 6–20)
CO2: 27 mmol/L (ref 22–32)
Calcium: 8.4 mg/dL — ABNORMAL LOW (ref 8.9–10.3)
Chloride: 102 mmol/L (ref 98–111)
Creatinine, Ser: 0.7 mg/dL (ref 0.44–1.00)
GFR, Estimated: >60 mL/min (ref >60)
Glucose, Bld: 121 mg/dL — ABNORMAL HIGH (ref 70–99)
Potassium: 2.9 mmol/L — ABNORMAL LOW (ref 3.5–5.1)
Sodium: 138 mmol/L (ref 135–145)

## 2024-04-19 MED ORDER — POTASSIUM CHLORIDE CRYS ER 20 MEQ PO TBCR
40.0000 meq | EXTENDED_RELEASE_TABLET | Freq: Two times a day (BID) | ORAL | Status: AC
  Administered 2024-04-19 (×2): 40 meq via ORAL
  Filled 2024-04-19 (×2): qty 2

## 2024-04-19 NOTE — Progress Notes (Addendum)
 7 Days Post-Op   Subjective/Chief Complaint: Tol fulls last night, having bowel function, feels well today   Objective: Vital signs in last 24 hours: Temp:  [98.3 F (36.8 C)-98.5 F (36.9 C)] 98.3 F (36.8 C) (04/24 0846) Pulse Rate:  [66-78] 73 (04/24 0846) Resp:  [16-17] 17 (04/24 0846) BP: (137-150)/(62-85) 150/85 (04/24 0846) SpO2:  [98 %-100 %] 100 % (04/24 0846) Last BM Date : 04/18/24  Intake/Output from previous day: No intake/output data recorded. Intake/Output this shift: No intake/output data recorded.  General nad Lungs clear Cv regular Ab vac in place, appropriately tender  Lab Results:  Recent Labs    04/18/24 0139 04/19/24 0405  WBC 22.3* 20.7*  HGB 10.7* 9.7*  HCT 33.8* 30.4*  PLT 529* 486*   BMET Recent Labs    04/19/24 0405  NA 138  K 2.9*  CL 102  CO2 27  GLUCOSE 121*  BUN <5*  CREATININE 0.70  CALCIUM  8.4*   PT/INR No results for input(s): "LABPROT", "INR" in the last 72 hours. ABG No results for input(s): "PHART", "HCO3" in the last 72 hours.  Invalid input(s): "PCO2", "PO2"  Studies/Results: CT ABDOMEN PELVIS W CONTRAST Result Date: 04/18/2024 CLINICAL DATA:  Leukocytosis. Perforated appendicitis status post open ileocecectomy. EXAM: CT ABDOMEN AND PELVIS WITH CONTRAST TECHNIQUE: Multidetector CT imaging of the abdomen and pelvis was performed using the standard protocol following bolus administration of intravenous contrast. RADIATION DOSE REDUCTION: This exam was performed according to the departmental dose-optimization program which includes automated exposure control, adjustment of the mA and/or kV according to patient size and/or use of iterative reconstruction technique. CONTRAST:  75mL OMNIPAQUE  IOHEXOL  350 MG/ML SOLN COMPARISON:  CT abdomen and pelvis 04/11/2024. FINDINGS: Lower chest: There is atelectasis in the bilateral lower lobes fluid Hepatobiliary: No focal liver abnormality is seen. No gallstones, gallbladder wall  thickening, or biliary dilatation. Pancreas: Unremarkable. No pancreatic ductal dilatation or surrounding inflammatory changes. Spleen: Within normal limits. Adrenals/Urinary Tract: There is a 12 mm nodule in the right adrenal gland which is indeterminate and unchanged. The left adrenal gland is within normal limits. The bilateral kidneys appear within normal limits. There is wall thickening of the dome of the bladder with surrounding inflammatory stranding. Stomach/Bowel: Patient is status post appendectomy and cecectomy. Mid and proximal small bowel loops are dilated with air-fluid levels. No definitive transition point visualized, but there decompressed ileal loops in the right lower quadrant. The colon is nondilated. The stomach is within normal limits. Vascular/Lymphatic: Aorta and IVC are normal in size. No enlarged lymph nodes are seen. Reproductive: Status post hysterectomy. No adnexal masses. Other: There is inflammatory stranding in the right lower quadrant ileocecal region compatible with recent appendectomy. Percutaneous pelvic drain is present with distal tip in the right lower quadrant. There is a mesenteric fluid collection in the right lower quadrant separate from the catheter measuring 2.8 x 1.9 x 2.6 cm image 3/55. Small air-fluid collection is seen in the right lower quadrant mesentery measuring 2.4 x 2.4 by 3.6 cm, also separate from the catheter. These are both likely extraluminal, although evaluation is limited secondary to lack of oral and intravenous contrast. There is a small amount of free air in the right lower quadrant and trace free fluid in the pelvis compatible with recent surgery. Anterior abdominal wall wound is present. Musculoskeletal: There are degenerative changes at L5-S1. IMPRESSION: 1. Status post appendectomy and cecectomy. 2. There are 2 small fluid collections in the right lower quadrant mesentery separate from the  percutaneous drain. These are likely extraluminal, although  evaluation is limited secondary to lack of oral and intravenous contrast. 3. Small amount of free air in the right lower quadrant and trace free fluid in the pelvis compatible with recent surgery. 4. Dilated mid and proximal small bowel loops with air-fluid levels. No definitive transition point visualized, but there are decompressed ileal loops in the right lower quadrant. Findings may represent ileus or obstruction. 5. Wall thickening of the dome of the bladder with surrounding inflammatory stranding. Correlate clinically for cystitis. 6. Stable indeterminate right adrenal nodule. This can be further evaluated with MRI. Electronically Signed   By: Tyron Gallon M.D.   On: 04/18/2024 21:28    Anti-infectives: Anti-infectives (From admission, onward)    Start     Dose/Rate Route Frequency Ordered Stop   04/15/24 2345  metroNIDAZOLE  (FLAGYL ) tablet 500 mg       Note to Pharmacy: 1x dose--lost IV access and difficulty obtaining. To replace her 10pm IV dose while awaiting PICC   500 mg Oral Once 04/15/24 2247 04/15/24 2322   04/13/24 1730  cefTRIAXone  (ROCEPHIN ) 2 g in sodium chloride  0.9 % 100 mL IVPB       Placed in "And" Linked Group   2 g 200 mL/hr over 30 Minutes Intravenous Every 24 hours 04/12/24 2003 04/17/24 1735   04/12/24 2200  metroNIDAZOLE  (FLAGYL ) IVPB 500 mg       Placed in "And" Linked Group   500 mg 100 mL/hr over 60 Minutes Intravenous Every 12 hours 04/12/24 2003 04/18/24 2159   04/12/24 0313  cefTRIAXone  (ROCEPHIN ) 2 g in sodium chloride  0.9 % 100 mL IVPB  Status:  Discontinued       Placed in "And" Linked Group   2 g 200 mL/hr over 30 Minutes Intravenous Every 24 hours 04/12/24 0313 04/12/24 1951   04/12/24 0313  metroNIDAZOLE  (FLAGYL ) IVPB 500 mg  Status:  Discontinued       Placed in "And" Linked Group   500 mg 100 mL/hr over 60 Minutes Intravenous Every 12 hours 04/12/24 0313 04/12/24 1951   04/11/24 2115  metroNIDAZOLE  (FLAGYL ) IVPB 500 mg        500 mg 100 mL/hr  over 60 Minutes Intravenous  Once 04/11/24 2108 04/11/24 2210   04/11/24 2045  cefTRIAXone  (ROCEPHIN ) 2 g in sodium chloride  0.9 % 100 mL IVPB        2 g 200 mL/hr over 30 Minutes Intravenous  Once 04/11/24 2034 04/11/24 2110       Assessment/Plan: POD 7s/p lap converted to open ileocecectomy for perforated appendicitis, Dr. Alethea Andes 4/17 -will hold abx for now as wbc increasing and ct scan with no real concern, if not getting better may need to restart or do longer course but I dont think she currently has an infection -WBC down today - advance to regular -cont JP drain, can be removed prior to dc -mobilize -pulm toilet -multi-modal pain control    FEN - soft diet, replace potassium today, recheck in am VTE - lovenox  ID - off abx Erin Bradford 04/19/2024

## 2024-04-19 NOTE — Progress Notes (Signed)
 Mobility Specialist Progress Note:   04/19/24 1229  Mobility  Activity Ambulated with assistance in hallway  Level of Assistance Standby assist, set-up cues, supervision of patient - no hands on  Assistive Device Front wheel walker  Distance Ambulated (ft) 250 ft  Activity Response Tolerated well  Mobility Referral Yes  Mobility visit 1 Mobility  Mobility Specialist Start Time (ACUTE ONLY) 1105  Mobility Specialist Stop Time (ACUTE ONLY) 1122  Mobility Specialist Time Calculation (min) (ACUTE ONLY) 17 min   Pt received in chair, agreeable to mobility. Stated she feels better today compared to yesterday. No complaints during session. Pt returned to chair with call bell in reach and all needs met.  Erin Bradford  Mobility Specialist Please contact via [employer] office at [phone]

## 2024-04-19 NOTE — Plan of Care (Signed)

## 2024-04-19 NOTE — TOC Progression Note (Signed)
 Transition of Care (TOC) - Progression Note   Home VAC and dressing supplies delivered to patient and are at bedside.  Patient Details  Name: Erin Bradford MRN: [id number] Date of Birth: [birth year]  Transition of Care [facility]) CM/SW Contact  Kinga Cassar, Arturo Late, RN Phone Number: 04/19/2024, 10:01 AM  Clinical Narrative:       Expected Discharge Plan: Home w Home Health Services Barriers to Discharge: Continued Medical Work up  Expected Discharge Plan and Services   Discharge Planning Services: CM Consult Post Acute Care Choice: Home Health, Durable Medical Equipment Living arrangements for the past 2 months: Apartment                 DME Arranged: 3-N-1, Vac (see note)   Date DME Agency Contacted: 04/16/24 Time DME Agency Contacted: [phone] Representative spoke with at DME Agency: Gladys Lamp with [employer] for 3 in 1 , Tracy with Esteban Heinrich Sharol Decamp for Vac HH Arranged: RN [facility] Agency: [facility] Date [facility] Agency Contacted: 04/16/24 Time HH Agency Contacted: [phone] Representative spoke with at [facility] Agency: [facility]   Social Determinants of Health (SDOH) Interventions SDOH Screenings   Food Insecurity: Patient Declined (04/12/2024)  Housing: Unknown (04/18/2024)  Transportation Needs: No Transportation Needs (04/12/2024)  Utilities: Not At Risk (04/12/2024)  Depression (PHQ2-9): High Risk (03/16/2024)  Financial Resource Strain: Patient Declined (05/03/2023)  Physical Activity: Unknown (05/03/2023)  Social Connections: Unknown (05/03/2023)  Stress: Stress Concern Present (05/03/2023)  Tobacco Use: Low Risk  (04/12/2024)    Readmission Risk Interventions     No data to display

## 2024-04-20 LAB — BASIC METABOLIC PANEL WITH GFR
Anion gap: 8 (ref 5–15)
BUN: <5 mg/dL — ABNORMAL LOW (ref 6–20)
CO2: 26 mmol/L (ref 22–32)
Calcium: 8.3 mg/dL — ABNORMAL LOW (ref 8.9–10.3)
Chloride: 106 mmol/L (ref 98–111)
Creatinine, Ser: 0.65 mg/dL (ref 0.44–1.00)
GFR, Estimated: >60 mL/min (ref >60)
Glucose, Bld: 125 mg/dL — ABNORMAL HIGH (ref 70–99)
Potassium: 3.2 mmol/L — ABNORMAL LOW (ref 3.5–5.1)
Sodium: 140 mmol/L (ref 135–145)

## 2024-04-20 LAB — CBC
HCT: 29.7 % — ABNORMAL LOW (ref 36.0–46.0)
Hemoglobin: 9.4 g/dL — ABNORMAL LOW (ref 12.0–15.0)
MCH: 29.1 pg (ref 26.0–34.0)
MCHC: 31.6 g/dL (ref 30.0–36.0)
MCV: 92 fL (ref 80.0–100.0)
Platelets: 471 10*3/uL — ABNORMAL HIGH (ref 150–400)
RBC: 3.23 MIL/uL — ABNORMAL LOW (ref 3.87–5.11)
RDW: 13.4 % (ref 11.5–15.5)
WBC: 15.4 10*3/uL — ABNORMAL HIGH (ref 4.0–10.5)
nRBC: 0.2 % (ref 0.0–0.2)

## 2024-04-20 LAB — MAGNESIUM: Magnesium: 1.9 mg/dL (ref 1.7–2.4)

## 2024-04-20 LAB — SURGICAL PATHOLOGY

## 2024-04-20 MED ORDER — MAGNESIUM SULFATE 2 GM/50ML IV SOLN
2.0000 g | Freq: Once | INTRAVENOUS | Status: AC
  Administered 2024-04-20: 2 g via INTRAVENOUS
  Filled 2024-04-20: qty 50

## 2024-04-20 MED ORDER — CALCIUM POLYCARBOPHIL 625 MG PO TABS
625.0000 mg | ORAL_TABLET | Freq: Every day | ORAL | Status: DC
  Administered 2024-04-20 – 2024-04-24 (×4): 625 mg via ORAL
  Filled 2024-04-20 (×4): qty 1

## 2024-04-20 MED ORDER — POTASSIUM CHLORIDE CRYS ER 20 MEQ PO TBCR
40.0000 meq | EXTENDED_RELEASE_TABLET | Freq: Two times a day (BID) | ORAL | Status: AC
  Administered 2024-04-20 (×2): 40 meq via ORAL
  Filled 2024-04-20 (×2): qty 2

## 2024-04-20 NOTE — Plan of Care (Signed)

## 2024-04-20 NOTE — Consult Note (Addendum)
 WOC Nurse Consult Note: The PA follow the dressing change and take photos for chart. Reason for Consult: Change VAC on abdominal wound. PA was present. Wound type: Surgical. Pressure Injury POA: NA Measurement: 14 x 5 x 4 cm Wound bed: 80% pale pink, 20% fat tissue. Drainage (amount, consistency, odor) Minimum amount, no odor, serous. 80 ml on canister at 0930. Periwound: intact Dressing procedure/placement/frequency: Removed old NPWT dressing Cleansed wound with normal saline Periwound skin protected with skin barrier wipe. Applied a half ring at 3 and 9 o'clock in her waist (fold) to prevent leakage. Filled wound with 1 piece of black foam  Sealed NPWT dressing at HG/179mmHG  Patient received IV pain medication per bedside nurse prior to dressing change Patient tolerated procedure well    WOC nurse will continue to provide NPWT dressing changed due to the complexity of the dressing change.    WOC team will follow on TUE.  Please reconsult if further assistance is needed. Thank-you,  Rachel Budds BSN, RN, [employer], WOC  (Pager: [phone])

## 2024-04-20 NOTE — Progress Notes (Signed)
 8 Days Post-Op   Subjective/Chief Complaint: Enjoyed transition to solid diet - no n/v/worsening abdominal pain. She has had loose bowel movements since the PO contrast for her CT scan but finally slowed down last night before midnight and she hasn't had any more yet this am. Pain overall stable and controlled.   Objective: Vital signs in last 24 hours: Temp:  [98.4 F (36.9 C)-98.8 F (37.1 C)] 98.8 F (37.1 C) (04/25 0718) Pulse Rate:  [71-73] 71 (04/25 0718) Resp:  [16-17] 16 (04/25 0718) BP: (133-151)/(73-99) 135/89 (04/25 0718) SpO2:  [93 %-100 %] 93 % (04/25 0833) Last BM Date : 04/19/24  Intake/Output from previous day: No intake/output data recorded. Intake/Output this shift: No intake/output data recorded.  General nad Lungs clear Cv regular Ab vac in place - removed and replaced this am. Midline incision with start of granulation tissue - no discharge or bleeding. Staple to lap incision removed and steri strip placed - incision c/di, appropriately tender. Drain with purulent bloody drainage    Lab Results:  Recent Labs    04/18/24 0139 04/19/24 0405  WBC 22.3* 20.7*  HGB 10.7* 9.7*  HCT 33.8* 30.4*  PLT 529* 486*   BMET Recent Labs    04/19/24 0405 04/20/24 0359  NA 138 140  K 2.9* 3.2*  CL 102 106  CO2 27 26  GLUCOSE 121* 125*  BUN <5* <5*  CREATININE 0.70 0.65  CALCIUM  8.4* 8.3*   PT/INR No results for input(s): "LABPROT", "INR" in the last 72 hours. ABG No results for input(s): "PHART", "HCO3" in the last 72 hours.  Invalid input(s): "PCO2", "PO2"  Studies/Results: CT ABDOMEN PELVIS W CONTRAST Result Date: 04/18/2024 CLINICAL DATA:  Leukocytosis. Perforated appendicitis status post open ileocecectomy. EXAM: CT ABDOMEN AND PELVIS WITH CONTRAST TECHNIQUE: Multidetector CT imaging of the abdomen and pelvis was performed using the standard protocol following bolus administration of intravenous contrast. RADIATION DOSE REDUCTION: This exam was  performed according to the departmental dose-optimization program which includes automated exposure control, adjustment of the mA and/or kV according to patient size and/or use of iterative reconstruction technique. CONTRAST:  75mL OMNIPAQUE  IOHEXOL  350 MG/ML SOLN COMPARISON:  CT abdomen and pelvis 04/11/2024. FINDINGS: Lower chest: There is atelectasis in the bilateral lower lobes fluid Hepatobiliary: No focal liver abnormality is seen. No gallstones, gallbladder wall thickening, or biliary dilatation. Pancreas: Unremarkable. No pancreatic ductal dilatation or surrounding inflammatory changes. Spleen: Within normal limits. Adrenals/Urinary Tract: There is a 12 mm nodule in the right adrenal gland which is indeterminate and unchanged. The left adrenal gland is within normal limits. The bilateral kidneys appear within normal limits. There is wall thickening of the dome of the bladder with surrounding inflammatory stranding. Stomach/Bowel: Patient is status post appendectomy and cecectomy. Mid and proximal small bowel loops are dilated with air-fluid levels. No definitive transition point visualized, but there decompressed ileal loops in the right lower quadrant. The colon is nondilated. The stomach is within normal limits. Vascular/Lymphatic: Aorta and IVC are normal in size. No enlarged lymph nodes are seen. Reproductive: Status post hysterectomy. No adnexal masses. Other: There is inflammatory stranding in the right lower quadrant ileocecal region compatible with recent appendectomy. Percutaneous pelvic drain is present with distal tip in the right lower quadrant. There is a mesenteric fluid collection in the right lower quadrant separate from the catheter measuring 2.8 x 1.9 x 2.6 cm image 3/55. Small air-fluid collection is seen in the right lower quadrant mesentery measuring 2.4 x 2.4 by 3.6  cm, also separate from the catheter. These are both likely extraluminal, although evaluation is limited secondary to lack  of oral and intravenous contrast. There is a small amount of free air in the right lower quadrant and trace free fluid in the pelvis compatible with recent surgery. Anterior abdominal wall wound is present. Musculoskeletal: There are degenerative changes at L5-S1. IMPRESSION: 1. Status post appendectomy and cecectomy. 2. There are 2 small fluid collections in the right lower quadrant mesentery separate from the percutaneous drain. These are likely extraluminal, although evaluation is limited secondary to lack of oral and intravenous contrast. 3. Small amount of free air in the right lower quadrant and trace free fluid in the pelvis compatible with recent surgery. 4. Dilated mid and proximal small bowel loops with air-fluid levels. No definitive transition point visualized, but there are decompressed ileal loops in the right lower quadrant. Findings may represent ileus or obstruction. 5. Wall thickening of the dome of the bladder with surrounding inflammatory stranding. Correlate clinically for cystitis. 6. Stable indeterminate right adrenal nodule. This can be further evaluated with MRI. Electronically Signed   By: Tyron Gallon M.D.   On: 04/18/2024 21:28    Anti-infectives: Anti-infectives (From admission, onward)    Start     Dose/Rate Route Frequency Ordered Stop   04/15/24 2345  metroNIDAZOLE  (FLAGYL ) tablet 500 mg       Note to Pharmacy: 1x dose--lost IV access and difficulty obtaining. To replace her 10pm IV dose while awaiting PICC   500 mg Oral Once 04/15/24 2247 04/15/24 2322   04/13/24 1730  cefTRIAXone  (ROCEPHIN ) 2 g in sodium chloride  0.9 % 100 mL IVPB       Placed in "And" Linked Group   2 g 200 mL/hr over 30 Minutes Intravenous Every 24 hours 04/12/24 2003 04/17/24 1735   04/12/24 2200  metroNIDAZOLE  (FLAGYL ) IVPB 500 mg       Placed in "And" Linked Group   500 mg 100 mL/hr over 60 Minutes Intravenous Every 12 hours 04/12/24 2003 04/18/24 2159   04/12/24 0313  cefTRIAXone  (ROCEPHIN )  2 g in sodium chloride  0.9 % 100 mL IVPB  Status:  Discontinued       Placed in "And" Linked Group   2 g 200 mL/hr over 30 Minutes Intravenous Every 24 hours 04/12/24 0313 04/12/24 1951   04/12/24 0313  metroNIDAZOLE  (FLAGYL ) IVPB 500 mg  Status:  Discontinued       Placed in "And" Linked Group   500 mg 100 mL/hr over 60 Minutes Intravenous Every 12 hours 04/12/24 0313 04/12/24 1951   04/11/24 2115  metroNIDAZOLE  (FLAGYL ) IVPB 500 mg        500 mg 100 mL/hr over 60 Minutes Intravenous  Once 04/11/24 2108 04/11/24 2210   04/11/24 2045  cefTRIAXone  (ROCEPHIN ) 2 g in sodium chloride  0.9 % 100 mL IVPB        2 g 200 mL/hr over 30 Minutes Intravenous  Once 04/11/24 2034 04/11/24 2110       Assessment/Plan: POD 8 s/p lap converted to open ileocecectomy for perforated appendicitis, Dr. Alethea Andes 4/17 - AF and WBC downtrending with repeat pending today. CT 4/23 with small fluid collections in RLQ near the course of JP drain and now with purulent output into drain. Will discuss abx with MD - tolerating reg diet -cont JP drain - hypokalemia - replace po today. Check mag -mobilize -pulm toilet -multi-modal pain control   FEN - soft diet VTE - lovenox  ID - off  abx  Elwin Hammond, [facility] 04/20/2024, 8:51 AM Please see Amion for pager number during day hours 7:00am-4:30pm

## 2024-04-21 ENCOUNTER — Inpatient Hospital Stay (HOSPITAL_COMMUNITY)

## 2024-04-21 LAB — BASIC METABOLIC PANEL WITH GFR
Anion gap: 6 (ref 5–15)
BUN: <5 mg/dL — ABNORMAL LOW (ref 6–20)
CO2: 28 mmol/L (ref 22–32)
Calcium: 7.9 mg/dL — ABNORMAL LOW (ref 8.9–10.3)
Chloride: 107 mmol/L (ref 98–111)
Creatinine, Ser: 0.68 mg/dL (ref 0.44–1.00)
GFR, Estimated: >60 mL/min (ref >60)
Glucose, Bld: 153 mg/dL — ABNORMAL HIGH (ref 70–99)
Potassium: 3.6 mmol/L (ref 3.5–5.1)
Sodium: 141 mmol/L (ref 135–145)

## 2024-04-21 MED ORDER — IOHEXOL 350 MG/ML SOLN
75.0000 mL | Freq: Once | INTRAVENOUS | Status: AC | PRN
  Administered 2024-04-21: 75 mL via INTRAVENOUS

## 2024-04-21 NOTE — Plan of Care (Signed)
 ?  Problem: Education: ?Goal: Knowledge of General Education information will improve ?Description: Including pain rating scale, medication(s)/side effects and non-pharmacologic comfort measures ?Outcome: Progressing ?  ?Problem: Health Behavior/Discharge Planning: ?Goal: Ability to manage health-related needs will improve ?Outcome: Progressing ?  ?Problem: Clinical Measurements: ?Goal: Ability to maintain clinical measurements within normal limits will improve ?Outcome: Progressing ?Goal: Diagnostic test results will improve ?Outcome: Progressing ?  ?Problem: Activity: ?Goal: Risk for activity intolerance will decrease ?Outcome: Progressing ?  ?Problem: Nutrition: ?Goal: Adequate nutrition will be maintained ?Outcome: Progressing ?  ?

## 2024-04-21 NOTE — Progress Notes (Signed)
 9 Days Post-Op   Subjective/Chief Complaint: Pt doing well this AM Tol Po   Objective: Vital signs in last 24 hours: Temp:  [98.2 F (36.8 C)-98.7 F (37.1 C)] 98.4 F (36.9 C) (04/26 0542) Pulse Rate:  [69-76] 69 (04/26 0542) Resp:  [16-18] 18 (04/26 0542) BP: (138-142)/(75-85) 141/85 (04/26 0542) SpO2:  [93 %-98 %] 98 % (04/26 0542) Last BM Date : 04/20/24  Intake/Output from previous day: 04/25 0701 - 04/26 0700 In: 50.1 [IV Piggyback:50.1] Out: 95 [Drains:95] Intake/Output this shift: No intake/output data recorded.  PE:  Constitutional: No acute distress, conversant, appears states age. Eyes: Anicteric sclerae, moist conjunctiva, no lid lag Lungs: Clear to auscultation bilaterally, normal respiratory effort CV: regular rate and rhythm, no murmurs, no peripheral edema, pedal pulses 2+ GI: Soft, vac in place, JPs purulent Skin: No rashes, palpation reveals normal turgor Psychiatric: appropriate judgment and insight, oriented to person, place, and time   Lab Results:  Recent Labs    04/19/24 0405 04/20/24 1015  WBC 20.7* 15.4*  HGB 9.7* 9.4*  HCT 30.4* 29.7*  PLT 486* 471*   BMET Recent Labs    04/20/24 0359 04/21/24 0223  NA 140 141  K 3.2* 3.6  CL 106 107  CO2 26 28  GLUCOSE 125* 153*  BUN <5* <5*  CREATININE 0.65 0.68  CALCIUM  8.3* 7.9*   PT/INR No results for input(s): "LABPROT", "INR" in the last 72 hours. ABG No results for input(s): "PHART", "HCO3" in the last 72 hours.  Invalid input(s): "PCO2", "PO2"  Studies/Results: No results found.  Anti-infectives: Anti-infectives (From admission, onward)    Start     Dose/Rate Route Frequency Ordered Stop   04/15/24 2345  metroNIDAZOLE  (FLAGYL ) tablet 500 mg       Note to Pharmacy: 1x dose--lost IV access and difficulty obtaining. To replace her 10pm IV dose while awaiting PICC   500 mg Oral Once 04/15/24 2247 04/15/24 2322   04/13/24 1730  cefTRIAXone  (ROCEPHIN ) 2 g in sodium chloride  0.9  % 100 mL IVPB       Placed in "And" Linked Group   2 g 200 mL/hr over 30 Minutes Intravenous Every 24 hours 04/12/24 2003 04/17/24 1735   04/12/24 2200  metroNIDAZOLE  (FLAGYL ) IVPB 500 mg       Placed in "And" Linked Group   500 mg 100 mL/hr over 60 Minutes Intravenous Every 12 hours 04/12/24 2003 04/18/24 2159   04/12/24 0313  cefTRIAXone  (ROCEPHIN ) 2 g in sodium chloride  0.9 % 100 mL IVPB  Status:  Discontinued       Placed in "And" Linked Group   2 g 200 mL/hr over 30 Minutes Intravenous Every 24 hours 04/12/24 0313 04/12/24 1951   04/12/24 0313  metroNIDAZOLE  (FLAGYL ) IVPB 500 mg  Status:  Discontinued       Placed in "And" Linked Group   500 mg 100 mL/hr over 60 Minutes Intravenous Every 12 hours 04/12/24 0313 04/12/24 1951   04/11/24 2115  metroNIDAZOLE  (FLAGYL ) IVPB 500 mg        500 mg 100 mL/hr over 60 Minutes Intravenous  Once 04/11/24 2108 04/11/24 2210   04/11/24 2045  cefTRIAXone  (ROCEPHIN ) 2 g in sodium chloride  0.9 % 100 mL IVPB        2 g 200 mL/hr over 30 Minutes Intravenous  Once 04/11/24 2034 04/11/24 2110       Assessment/Plan: POD 9 s/p lap converted to open ileocecectomy for perforated appendicitis, Dr. Alethea Andes 4/17, Path =  Stage 3 colon CA -d/w pt the final pathology.  Plan to obtain CEA and CT chest.  May benefit from r colectomy while here.  Will likely need post op chemo and will have f/u with Onc MD. - tolerating reg diet -cont JP drain - hypokalemia - replace po today. Check mag -mobilize -pulm toilet -multi-modal pain control   FEN - reg diet VTE - lovenox  ID - off abx  LOS: 9 days    Shela Derby 04/21/2024

## 2024-04-22 LAB — SURGICAL PCR SCREEN
MRSA, PCR: NEGATIVE
Staphylococcus aureus: NEGATIVE

## 2024-04-22 MED ORDER — ENSURE PRE-SURGERY PO LIQD
592.0000 mL | Freq: Once | ORAL | Status: AC
  Administered 2024-04-23: 592 mL via ORAL
  Filled 2024-04-22: qty 592

## 2024-04-22 MED ORDER — NEOMYCIN SULFATE 500 MG PO TABS
1000.0000 mg | ORAL_TABLET | ORAL | Status: AC
  Administered 2024-04-22 (×2): 1000 mg via ORAL
  Filled 2024-04-22 (×3): qty 2

## 2024-04-22 MED ORDER — ENSURE PRE-SURGERY PO LIQD
296.0000 mL | Freq: Once | ORAL | Status: AC
  Filled 2024-04-22: qty 296

## 2024-04-22 MED ORDER — METRONIDAZOLE 500 MG PO TABS
1000.0000 mg | ORAL_TABLET | ORAL | Status: AC
  Administered 2024-04-22 (×2): 1000 mg via ORAL
  Filled 2024-04-22 (×2): qty 2

## 2024-04-22 MED ORDER — ENOXAPARIN SODIUM 40 MG/0.4ML IJ SOSY
40.0000 mg | PREFILLED_SYRINGE | Freq: Once | INTRAMUSCULAR | Status: AC
  Administered 2024-04-23: 40 mg via SUBCUTANEOUS
  Filled 2024-04-22: qty 0.4

## 2024-04-22 MED ORDER — CHLORHEXIDINE GLUCONATE CLOTH 2 % EX PADS
6.0000 | MEDICATED_PAD | Freq: Once | CUTANEOUS | Status: AC
  Administered 2024-04-23: 6 via TOPICAL

## 2024-04-22 MED ORDER — ALVIMOPAN 12 MG PO CAPS
12.0000 mg | ORAL_CAPSULE | ORAL | Status: DC
  Filled 2024-04-22: qty 1

## 2024-04-22 MED ORDER — SODIUM CHLORIDE 0.9 % IV SOLN
2.0000 g | INTRAVENOUS | Status: DC
  Filled 2024-04-22: qty 2

## 2024-04-22 MED ORDER — CHLORHEXIDINE GLUCONATE CLOTH 2 % EX PADS
6.0000 | MEDICATED_PAD | Freq: Once | CUTANEOUS | Status: AC
  Administered 2024-04-22: 6 via TOPICAL

## 2024-04-22 MED ORDER — POLYETHYLENE GLYCOL 3350 17 GM/SCOOP PO POWD
119.0000 g | Freq: Once | ORAL | Status: AC
  Administered 2024-04-22: 119 g via ORAL
  Filled 2024-04-22: qty 119

## 2024-04-22 NOTE — Plan of Care (Signed)

## 2024-04-22 NOTE — Progress Notes (Signed)
 10 Days Post-Op   Subjective/Chief Complaint: Pt with no acute changes   Objective: Vital signs in last 24 hours: Temp:  [97.9 F (36.6 C)-98.6 F (37 C)] 97.9 F (36.6 C) (04/27 0349) Pulse Rate:  [69-73] 69 (04/27 0349) Resp:  [16-18] 16 (04/27 0349) BP: (131-167)/(76-91) 157/91 (04/27 0349) SpO2:  [98 %-99 %] 98 % (04/27 0349) Last BM Date : 04/20/24  Intake/Output from previous day: 04/26 0701 - 04/27 0700 In: 360 [P.O.:360] Out: -  Intake/Output this shift: No intake/output data recorded.  PE:  Constitutional: No acute distress, conversant, appears states age. Eyes: Anicteric sclerae, moist conjunctiva, no lid lag Lungs: Clear to auscultation bilaterally, normal respiratory effort CV: regular rate and rhythm, no murmurs, no peripheral edema, pedal pulses 2+ GI: Soft, no masses or hepatosplenomegaly, non-tender to palpation, inc with vac Skin: No rashes, palpation reveals normal turgor Psychiatric: appropriate judgment and insight, oriented to person, place, and time   Lab Results:  Recent Labs    04/20/24 1015  WBC 15.4*  HGB 9.4*  HCT 29.7*  PLT 471*   BMET Recent Labs    04/20/24 0359 04/21/24 0223  NA 140 141  K 3.2* 3.6  CL 106 107  CO2 26 28  GLUCOSE 125* 153*  BUN <5* <5*  CREATININE 0.65 0.68  CALCIUM  8.3* 7.9*   PT/INR No results for input(s): "LABPROT", "INR" in the last 72 hours. ABG No results for input(s): "PHART", "HCO3" in the last 72 hours.  Invalid input(s): "PCO2", "PO2"  Studies/Results: CT CHEST W CONTRAST Result Date: 04/21/2024 CLINICAL DATA:  New diagnosis of colon cancer. Staging. * Tracking Code: BO * EXAM: CT CHEST WITH CONTRAST TECHNIQUE: Multidetector CT imaging of the chest was performed during intravenous contrast administration. RADIATION DOSE REDUCTION: This exam was performed according to the departmental dose-optimization program which includes automated exposure control, adjustment of the mA and/or kV according  to patient size and/or use of iterative reconstruction technique. CONTRAST:  75mL OMNIPAQUE  IOHEXOL  350 MG/ML SOLN COMPARISON:  None Available. FINDINGS: Cardiovascular: Aortic atherosclerosis. Borderline cardiomegaly with trace pericardial fluid, likely physiologic. Lad and right coronary artery calcification. No central pulmonary embolism, on this non-dedicated study. Right-sided PICC line terminates in the low SVC. Mediastinum/Nodes: No supraclavicular adenopathy. No middle mediastinal or hilar adenopathy. Anterior mediastinal 1.1 x 1.4 cm nodule or node on 49/4. Lungs/Pleura: No pleural fluid. Subsegmental atelectasis in both lower lobes. Bilateral tiny nonspecific pulmonary nodules. Example solid 4 mm left upper lobe nodule on 33/5. 2 mm right upper lobe pulmonary nodule on 50/5. Vague, possibly ground-glass lingular 4 mm nodule on 60/5. Upper Abdomen: Deferred to recent diagnostic CT. No superimposed acute process. Musculoskeletal: No acute osseous abnormality. IMPRESSION: 1. Tiny bilateral pulmonary nodules are nonspecific, unlikely to represent metastatic disease. These can be re-evaluated on chest CT at 6 months. 2. Soft tissue density in the anterior mediastinum of 1.4 cm is favored to represent isolated adenopathy-which could be reactive or represent an atypical distribution of colon cancer metastasis. Differential considerations include thymic hyperplasia or neoplasm. This could be re-evaluated on chest CT at 6 months. A more aggressive approach would include PET. 3. Age advanced coronary artery atherosclerosis. Recommend assessment of coronary risk factors. 4.  Aortic Atherosclerosis (ICD10-I70.0). Electronically Signed   By: Lore Rode M.D.   On: 04/21/2024 17:49    Anti-infectives: Anti-infectives (From admission, onward)    Start     Dose/Rate Route Frequency Ordered Stop   04/15/24 2345  metroNIDAZOLE  (FLAGYL ) tablet 500 mg  Note to Pharmacy: 1x dose--lost IV access and difficulty  obtaining. To replace her 10pm IV dose while awaiting PICC   500 mg Oral Once 04/15/24 2247 04/15/24 2322   04/13/24 1730  cefTRIAXone  (ROCEPHIN ) 2 g in sodium chloride  0.9 % 100 mL IVPB       Placed in "And" Linked Group   2 g 200 mL/hr over 30 Minutes Intravenous Every 24 hours 04/12/24 2003 04/17/24 1735   04/12/24 2200  metroNIDAZOLE  (FLAGYL ) IVPB 500 mg       Placed in "And" Linked Group   500 mg 100 mL/hr over 60 Minutes Intravenous Every 12 hours 04/12/24 2003 04/18/24 2159   04/12/24 0313  cefTRIAXone  (ROCEPHIN ) 2 g in sodium chloride  0.9 % 100 mL IVPB  Status:  Discontinued       Placed in "And" Linked Group   2 g 200 mL/hr over 30 Minutes Intravenous Every 24 hours 04/12/24 0313 04/12/24 1951   04/12/24 0313  metroNIDAZOLE  (FLAGYL ) IVPB 500 mg  Status:  Discontinued       Placed in "And" Linked Group   500 mg 100 mL/hr over 60 Minutes Intravenous Every 12 hours 04/12/24 0313 04/12/24 1951   04/11/24 2115  metroNIDAZOLE  (FLAGYL ) IVPB 500 mg        500 mg 100 mL/hr over 60 Minutes Intravenous  Once 04/11/24 2108 04/11/24 2210   04/11/24 2045  cefTRIAXone  (ROCEPHIN ) 2 g in sodium chloride  0.9 % 100 mL IVPB        2 g 200 mL/hr over 30 Minutes Intravenous  Once 04/11/24 2034 04/11/24 2110       Assessment/Plan: POD 10 s/p lap converted to open ileocecectomy for perforated appendicitis, Dr. Alethea Andes 4/17, Path = Stage 3 colon CA -d/w pt the final pathology.  Will need f/u  CT chest 50mo.   -Will need r colectomy.  Will do slow prep today for possible surgery tomorrow w/ Dr. Lanell Pinta -Will likely need post op chemo and will have f/u with Onc MD. - tolerating reg diet -cont JP drain - hypokalemia - replace po today. Check mag -mobilize -pulm toilet -multi-modal pain control   FEN - reg diet, npo p mn VTE - lovenox  ID - off abx  LOS: 10 days    Shela Derby 04/22/2024

## 2024-04-23 LAB — BASIC METABOLIC PANEL WITH GFR
Anion gap: 9 (ref 5–15)
BUN: <5 mg/dL — ABNORMAL LOW (ref 6–20)
CO2: 28 mmol/L (ref 22–32)
Calcium: 8.7 mg/dL — ABNORMAL LOW (ref 8.9–10.3)
Chloride: 102 mmol/L (ref 98–111)
Creatinine, Ser: 0.72 mg/dL (ref 0.44–1.00)
GFR, Estimated: >60 mL/min (ref >60)
Glucose, Bld: 113 mg/dL — ABNORMAL HIGH (ref 70–99)
Potassium: 3.4 mmol/L — ABNORMAL LOW (ref 3.5–5.1)
Sodium: 139 mmol/L (ref 135–145)

## 2024-04-23 LAB — CBC
HCT: 31.7 % — ABNORMAL LOW (ref 36.0–46.0)
Hemoglobin: 10.1 g/dL — ABNORMAL LOW (ref 12.0–15.0)
MCH: 29.6 pg (ref 26.0–34.0)
MCHC: 31.9 g/dL (ref 30.0–36.0)
MCV: 93 fL (ref 80.0–100.0)
Platelets: 519 10*3/uL — ABNORMAL HIGH (ref 150–400)
RBC: 3.41 MIL/uL — ABNORMAL LOW (ref 3.87–5.11)
RDW: 14 % (ref 11.5–15.5)
WBC: 12.9 10*3/uL — ABNORMAL HIGH (ref 4.0–10.5)
nRBC: 0.2 % (ref 0.0–0.2)

## 2024-04-23 NOTE — Plan of Care (Signed)
  Problem: Health Behavior/Discharge Planning: Goal: Ability to manage health-related needs will improve Outcome: Progressing   Problem: Activity: Goal: Risk for activity intolerance will decrease Outcome: Progressing   Problem: Nutrition: Goal: Adequate nutrition will be maintained Outcome: Progressing   Problem: Pain Managment: Goal: General experience of comfort will improve and/or be controlled Outcome: Progressing

## 2024-04-23 NOTE — Progress Notes (Signed)
 Mobility Specialist Progress Note:   04/23/24 1249  Mobility  Activity Ambulated with assistance in hallway  Level of Assistance Standby assist, set-up cues, supervision of patient - no hands on  Assistive Device Front wheel walker  Distance Ambulated (ft) 250 ft  Activity Response Tolerated well  Mobility Referral Yes  Mobility visit 1 Mobility  Mobility Specialist Start Time (ACUTE ONLY) 1035  Mobility Specialist Stop Time (ACUTE ONLY) 1045  Mobility Specialist Time Calculation (min) (ACUTE ONLY) 10 min   Pt received in chair, agreeable to mobility. Denied any discomfort during ambulation, asx throughout. Pt returned to chair with call bell in reach and all needs met.   Sofia Dunn  Mobility Specialist Please contact via [employer] office at [phone]

## 2024-04-23 NOTE — Progress Notes (Signed)
 11 Days Post-Op   Subjective/Chief Complaint: Has been NPO for possible surgery today but had otherwise been tolerating regular diet with improved appetite and bowel movements. No n/v. Pain stable and controlled with medications.   Daughter at bedside Objective: Vital signs in last 24 hours: Temp:  [97.8 F (36.6 C)-98.5 F (36.9 C)] 98.3 F (36.8 C) (04/28 0720) Pulse Rate:  [65-70] 67 (04/28 0720) Resp:  [16-17] 16 (04/28 0720) BP: (136-171)/(65-97) 168/90 (04/28 0720) SpO2:  [97 %-100 %] 99 % (04/28 0720) Last BM Date : 04/23/24  Intake/Output from previous day: 04/27 0701 - 04/28 0700 In: 870 [P.O.:870] Out: 555 [Urine:450; Drains:105] Intake/Output this shift: No intake/output data recorded.  PE:  Constitutional: No acute distress, conversant, appears stated age. Lungs:  normal respiratory effort GI: Soft, no masses or hepatosplenomegaly, non-tender to palpation, incision  with vac with good suction and SS fluid in canister. Drain with scan purulence tinged SS fluid - 5 ml in last 24h Skin: No rashes, palpation reveals normal turgor Psychiatric: appropriate judgment and insight, oriented to person, place, and time   Lab Results:  No results for input(s): "WBC", "HGB", "HCT", "PLT" in the last 72 hours.  BMET Recent Labs    04/21/24 0223  NA 141  K 3.6  CL 107  CO2 28  GLUCOSE 153*  BUN <5*  CREATININE 0.68  CALCIUM  7.9*   PT/INR No results for input(s): "LABPROT", "INR" in the last 72 hours. ABG No results for input(s): "PHART", "HCO3" in the last 72 hours.  Invalid input(s): "PCO2", "PO2"  Studies/Results: CT CHEST W CONTRAST Result Date: 04/21/2024 CLINICAL DATA:  New diagnosis of colon cancer. Staging. * Tracking Code: BO * EXAM: CT CHEST WITH CONTRAST TECHNIQUE: Multidetector CT imaging of the chest was performed during intravenous contrast administration. RADIATION DOSE REDUCTION: This exam was performed according to the departmental  dose-optimization program which includes automated exposure control, adjustment of the mA and/or kV according to patient size and/or use of iterative reconstruction technique. CONTRAST:  75mL OMNIPAQUE  IOHEXOL  350 MG/ML SOLN COMPARISON:  None Available. FINDINGS: Cardiovascular: Aortic atherosclerosis. Borderline cardiomegaly with trace pericardial fluid, likely physiologic. Lad and right coronary artery calcification. No central pulmonary embolism, on this non-dedicated study. Right-sided PICC line terminates in the low SVC. Mediastinum/Nodes: No supraclavicular adenopathy. No middle mediastinal or hilar adenopathy. Anterior mediastinal 1.1 x 1.4 cm nodule or node on 49/4. Lungs/Pleura: No pleural fluid. Subsegmental atelectasis in both lower lobes. Bilateral tiny nonspecific pulmonary nodules. Example solid 4 mm left upper lobe nodule on 33/5. 2 mm right upper lobe pulmonary nodule on 50/5. Vague, possibly ground-glass lingular 4 mm nodule on 60/5. Upper Abdomen: Deferred to recent diagnostic CT. No superimposed acute process. Musculoskeletal: No acute osseous abnormality. IMPRESSION: 1. Tiny bilateral pulmonary nodules are nonspecific, unlikely to represent metastatic disease. These can be re-evaluated on chest CT at 6 months. 2. Soft tissue density in the anterior mediastinum of 1.4 cm is favored to represent isolated adenopathy-which could be reactive or represent an atypical distribution of colon cancer metastasis. Differential considerations include thymic hyperplasia or neoplasm. This could be re-evaluated on chest CT at 6 months. A more aggressive approach would include PET. 3. Age advanced coronary artery atherosclerosis. Recommend assessment of coronary risk factors. 4.  Aortic Atherosclerosis (ICD10-I70.0). Electronically Signed   By: Lore Rode M.D.   On: 04/21/2024 17:49    Anti-infectives: Anti-infectives (From admission, onward)    Start     Dose/Rate Route Frequency Ordered Stop  04/23/24  0600  cefoTEtan (CEFOTAN) 2 g in sodium chloride  0.9 % 100 mL IVPB        2 g 200 mL/hr over 30 Minutes Intravenous On call to O.R. 04/22/24 0847 04/24/24 0559   04/22/24 1400  neomycin (MYCIFRADIN) tablet 1,000 mg       Placed in "And" Linked Group   1,000 mg Oral 3 times per day 04/22/24 0847 04/23/24 1359   04/22/24 1400  metroNIDAZOLE  (FLAGYL ) tablet 1,000 mg       Placed in "And" Linked Group   1,000 mg Oral 3 times per day 04/22/24 0847 04/23/24 1359   04/15/24 2345  metroNIDAZOLE  (FLAGYL ) tablet 500 mg       Note to Pharmacy: 1x dose--lost IV access and difficulty obtaining. To replace her 10pm IV dose while awaiting PICC   500 mg Oral Once 04/15/24 2247 04/15/24 2322   04/13/24 1730  cefTRIAXone  (ROCEPHIN ) 2 g in sodium chloride  0.9 % 100 mL IVPB       Placed in "And" Linked Group   2 g 200 mL/hr over 30 Minutes Intravenous Every 24 hours 04/12/24 2003 04/17/24 1735   04/12/24 2200  metroNIDAZOLE  (FLAGYL ) IVPB 500 mg       Placed in "And" Linked Group   500 mg 100 mL/hr over 60 Minutes Intravenous Every 12 hours 04/12/24 2003 04/18/24 2159   04/12/24 0313  cefTRIAXone  (ROCEPHIN ) 2 g in sodium chloride  0.9 % 100 mL IVPB  Status:  Discontinued       Placed in "And" Linked Group   2 g 200 mL/hr over 30 Minutes Intravenous Every 24 hours 04/12/24 0313 04/12/24 1951   04/12/24 0313  metroNIDAZOLE  (FLAGYL ) IVPB 500 mg  Status:  Discontinued       Placed in "And" Linked Group   500 mg 100 mL/hr over 60 Minutes Intravenous Every 12 hours 04/12/24 0313 04/12/24 1951   04/11/24 2115  metroNIDAZOLE  (FLAGYL ) IVPB 500 mg        500 mg 100 mL/hr over 60 Minutes Intravenous  Once 04/11/24 2108 04/11/24 2210   04/11/24 2045  cefTRIAXone  (ROCEPHIN ) 2 g in sodium chloride  0.9 % 100 mL IVPB        2 g 200 mL/hr over 30 Minutes Intravenous  Once 04/11/24 2034 04/11/24 2110       Assessment/Plan: POD 11 s/p lap converted to open ileocecectomy for perforated appendicitis, Dr. Alethea Andes 4/17, Path  = Stage 3 colon CA - pathology discussed with patient.   - CT chest with tiny bilateral pulm nodules and soft tissue density in ant mediastinum. Discussed with oncology Dr. Arno Bibles who recommends outpatient PET. Otherwise recommends follow up with med/onc outpatient.   - discussed possibility of completion R colectomy among surgical team including colorectal surgery and do not recommend further surgical intervention acutely as she has elevated risk for surgical complications in setting of recent surgery and infection and this would further delay adjuvant chemotherapy. Resume reg diet today - tolerating reg diet -cont JP drain for now but output down. Consider removal prior to dc - check CBC and BMP today -mobilize -pulm toilet -multi-modal pain control  Possible Dc in the next 24 hours   FEN - reg diet VTE - lovenox  ID - off abx   LOS: 11 days   Elwin Hammond, [facility] Surgery 04/23/2024, 12:01 PM Please see Amion for pager number during day hours 7:00am-4:30pm

## 2024-04-24 ENCOUNTER — Encounter: Payer: Self-pay | Admitting: *Deleted

## 2024-04-24 ENCOUNTER — Other Ambulatory Visit: Payer: Self-pay | Admitting: *Deleted

## 2024-04-24 DIAGNOSIS — C189 Malignant neoplasm of colon, unspecified: Secondary | ICD-10-CM

## 2024-04-24 LAB — BASIC METABOLIC PANEL WITH GFR
Anion gap: 8 (ref 5–15)
BUN: 5 mg/dL — ABNORMAL LOW (ref 6–20)
CO2: 27 mmol/L (ref 22–32)
Calcium: 8.3 mg/dL — ABNORMAL LOW (ref 8.9–10.3)
Chloride: 106 mmol/L (ref 98–111)
Creatinine, Ser: 0.75 mg/dL (ref 0.44–1.00)
GFR, Estimated: >60 mL/min (ref >60)
Glucose, Bld: 111 mg/dL — ABNORMAL HIGH (ref 70–99)
Potassium: 3.4 mmol/L — ABNORMAL LOW (ref 3.5–5.1)
Sodium: 141 mmol/L (ref 135–145)

## 2024-04-24 LAB — MAGNESIUM: Magnesium: 2 mg/dL (ref 1.7–2.4)

## 2024-04-24 MED ORDER — POTASSIUM CHLORIDE CRYS ER 20 MEQ PO TBCR
40.0000 meq | EXTENDED_RELEASE_TABLET | Freq: Once | ORAL | Status: AC
  Administered 2024-04-24: 40 meq via ORAL
  Filled 2024-04-24: qty 2

## 2024-04-24 MED ORDER — ACETAMINOPHEN 500 MG PO TABS: 1000.0000 mg | ORAL_TABLET | Freq: Four times a day (QID) | ORAL | Status: AC

## 2024-04-24 NOTE — Progress Notes (Signed)
 12 Days Post-Op   Subjective/Chief Complaint: Tolerating reg diet. Abdominal pain stable. Having bowel function. Having some ankle swelling/pain  Objective: Vital signs in last 24 hours: Temp:  [97.8 F (36.6 C)-98.2 F (36.8 C)] 97.8 F (36.6 C) (04/29 0727) Pulse Rate:  [64-82] 71 (04/29 0727) Resp:  [16-18] 17 (04/29 0727) BP: (140-181)/(64-97) 181/96 (04/29 0727) SpO2:  [96 %-100 %] 99 % (04/29 0727) Last BM Date : 04/23/24  Intake/Output from previous day: 04/28 0701 - 04/29 0700 In: 240 [P.O.:240] Out: -  Intake/Output this shift: No intake/output data recorded.  PE:  Constitutional: No acute distress, conversant, appears stated age. Lungs:  normal respiratory effort GI: Soft, no masses or hepatosplenomegaly, non-tender to palpation, incision  with vac with good suction and SS fluid in canister. Drain with scan purulence tinged SS fluid none charted over last 24h. Incision as below Skin: No rashes, palpation reveals normal turgor Psychiatric: appropriate judgment and insight, oriented to person, place, and time    Lab Results:  Recent Labs    04/23/24 1014  WBC 12.9*  HGB 10.1*  HCT 31.7*  PLT 519*    BMET Recent Labs    04/23/24 1014 04/24/24 0326  NA 139 141  K 3.4* 3.4*  CL 102 106  CO2 28 27  GLUCOSE 113* 111*  BUN <5* 5*  CREATININE 0.72 0.75  CALCIUM  8.7* 8.3*   PT/INR No results for input(s): "LABPROT", "INR" in the last 72 hours. ABG No results for input(s): "PHART", "HCO3" in the last 72 hours.  Invalid input(s): "PCO2", "PO2"  Studies/Results: No results found.   Anti-infectives: Anti-infectives (From admission, onward)    Start     Dose/Rate Route Frequency Ordered Stop   04/23/24 0600  cefoTEtan (CEFOTAN) 2 g in sodium chloride  0.9 % 100 mL IVPB  Status:  Discontinued        2 g 200 mL/hr over 30 Minutes Intravenous On call to O.R. 04/22/24 0847 04/23/24 1148   04/22/24 1400  neomycin (MYCIFRADIN) tablet 1,000 mg        Placed in "And" Linked Group   1,000 mg Oral 3 times per day 04/22/24 0847 04/23/24 1359   04/22/24 1400  metroNIDAZOLE  (FLAGYL ) tablet 1,000 mg       Placed in "And" Linked Group   1,000 mg Oral 3 times per day 04/22/24 0847 04/23/24 1359   04/15/24 2345  metroNIDAZOLE  (FLAGYL ) tablet 500 mg       Note to Pharmacy: 1x dose--lost IV access and difficulty obtaining. To replace her 10pm IV dose while awaiting PICC   500 mg Oral Once 04/15/24 2247 04/15/24 2322   04/13/24 1730  cefTRIAXone  (ROCEPHIN ) 2 g in sodium chloride  0.9 % 100 mL IVPB       Placed in "And" Linked Group   2 g 200 mL/hr over 30 Minutes Intravenous Every 24 hours 04/12/24 2003 04/17/24 1735   04/12/24 2200  metroNIDAZOLE  (FLAGYL ) IVPB 500 mg       Placed in "And" Linked Group   500 mg 100 mL/hr over 60 Minutes Intravenous Every 12 hours 04/12/24 2003 04/18/24 2159   04/12/24 0313  cefTRIAXone  (ROCEPHIN ) 2 g in sodium chloride  0.9 % 100 mL IVPB  Status:  Discontinued       Placed in "And" Linked Group   2 g 200 mL/hr over 30 Minutes Intravenous Every 24 hours 04/12/24 0313 04/12/24 1951   04/12/24 0313  metroNIDAZOLE  (FLAGYL ) IVPB 500 mg  Status:  Discontinued  Placed in "And" Linked Group   500 mg 100 mL/hr over 60 Minutes Intravenous Every 12 hours 04/12/24 0313 04/12/24 1951   04/11/24 2115  metroNIDAZOLE  (FLAGYL ) IVPB 500 mg        500 mg 100 mL/hr over 60 Minutes Intravenous  Once 04/11/24 2108 04/11/24 2210   04/11/24 2045  cefTRIAXone  (ROCEPHIN ) 2 g in sodium chloride  0.9 % 100 mL IVPB        2 g 200 mL/hr over 30 Minutes Intravenous  Once 04/11/24 2034 04/11/24 2110       Assessment/Plan: POD 12 s/p lap converted to open ileocecectomy for perforated appendicitis, Dr. Alethea Andes 4/17, Path = Stage 3 colon CA - pathology discussed with patient.   - CT chest with tiny bilateral pulm nodules and soft tissue density in ant mediastinum. Discussed with oncology Dr. Arno Bibles who recommends outpatient PET. Otherwise  recommends follow up with med/onc outpatient.  Referral placed and appt scheduled - discussed possibility of completion R colectomy among surgical team including colorectal surgery and do not recommend further surgical intervention acutely as she has elevated risk for surgical complications in setting of recent surgery and infection and this would further delay adjuvant chemotherapy.  - tolerating reg diet -cont JP drain for now but output down. possible removal prior to dc - hypokalemia. Replete po -mobilize -pulm toilet -multi-modal pain control  Dc today   FEN - reg diet VTE - lovenox  ID - off abx   LOS: 12 days   Elwin Hammond, [facility] Surgery 04/24/2024, 8:46 AM Please see Amion for pager number during day hours 7:00am-4:30pm

## 2024-04-24 NOTE — Consult Note (Addendum)
 WOC Nurse Consult Note: The PA follow the dressing change and take photos for chart. Reason for Consult: Change VAC on abdominal wound. PA was present. Wound type: Surgical. Pressure Injury POA: NA Measurement: 14 x 5 x 4 cm Wound bed: 80% pink, 20% fat tissue. Drainage (amount, consistency, odor) Minimum amount, no odor, serous. 15 ml on canister at 1030. Periwound: intact Dressing procedure/placement/frequency: Removed old NPWT dressing Cleansed wound with normal saline Periwound skin protected with skin barrier wipe. Applied a half ring at 3 and 9 o'clock in her waist (fold) to prevent leakage. Filled wound with 1 piece of black foam  Sealed NPWT dressing at HG/179mmHG  Patient received IV pain medication per bedside nurse prior to dressing change Patient tolerated procedure well Pt prefers to use the skin removal to take off the drape.  WOC nurse will continue to provide NPWT dressing changed due to the complexity of the dressing change.   Plans for DC today. I prepared the home VAC machine with the Canister and it is charging. The bed nurse can unplug the new dressing and plug on the portable VAC.   WOC team will follow on FRI.  Please reconsult if further assistance is needed. Thank-you,  Rachel Budds BSN, RN, [employer], WOC  (Pager: [phone])

## 2024-04-24 NOTE — Progress Notes (Signed)
 Oncology Discharge Planning Note  [facility] Address: [address], [place], [place] [address] Hours of Operation:  [place]ice [place], Monday - Friday  Clinic Contact Information:  [phone]) [phone]  Oncology Care Team: Medical Oncologist:  Scherrie Curt  Patient Details: Name:  Erin, Bradford MRN:   [id number] DOB:   [birth year] Reason for Current Admission: @PPROB @  Discharge Planning Narrative: Notification of admission received by inpatient team for [name].  Discharge follow-up appointments for oncology are current and available on the AVS and MyChart.   Upon discharge from the hospital, hematology/oncology's post discharge plan of care for the outpatient setting is:   Monday, Apr 30, 2024 at 1:40pm  [facility] [address] [place], [place] [address]  [phone]  One support person allowed in appointment      [facility] will be called within two business days after discharge to review hematology/oncology's plan of care for full understanding.    Outpatient Oncology Specific Care Only: Oncology appointment transportation needs addressed?:  no Oncology medication management for symptom management addressed?:  not applicable Chemo Alert Card reviewed?:  not applicable Immunotherapy Alert Card reviewed?:  not applicable

## 2024-04-24 NOTE — TOC Progression Note (Addendum)
 Transition of Care (TOC) - Progression Note   Patient for discharge today.   Notified Loetta Ringer with [facility] for [facility] dressing changes Tuesdays and Fridays   Home VAC was delivered to beside   Family took 3 in 1 home.   Ordered rolling walker to come to room  . Patient aware . Patient wanting walker delivered to home. Zachary with [employer] said it would be 1 to 2 days and patient is OK with that . Patient realized she did not get the 3 in 1 and wants that sent to home also. Raechel Bulla with [employer] aware  Patient Details  Name: Erin Bradford MRN: [id number] Date of Birth: 05-10-1971  Transition of Care [facility]) CM/SW Contact  Desmen Schoffstall, Arturo Late, RN Phone Number: 04/24/2024, 1:55 PM  Clinical Narrative:       Expected Discharge Plan: Home w Home Health Services Barriers to Discharge: Continued Medical Work up  Expected Discharge Plan and Services   Discharge Planning Services: CM Consult Post Acute Care Choice: Home Health, Durable Medical Equipment Living arrangements for the past 2 months: Apartment Expected Discharge Date: 04/24/24               DME Arranged: 3-N-1, Vac (see note)   Date DME Agency Contacted: 04/16/24 Time DME Agency Contacted: [phone] Representative spoke with at DME Agency: Gladys Lamp with [employer] for 3 in 1 , Tracy with Esteban Heinrich Sharol Decamp for Vac HH Arranged: RN [facility] Agency: [facility] Date [facility] Agency Contacted: 04/16/24 Time HH Agency Contacted: [id number] Representative spoke with at [facility] Agency: [facility]   Social Determinants of Health (SDOH) Interventions SDOH Screenings   Food Insecurity: Patient Declined (04/12/2024)  Housing: Unknown (04/18/2024)  Transportation Needs: No Transportation Needs (04/12/2024)  Utilities: Not At Risk (04/12/2024)  Depression (PHQ2-9): High Risk (03/16/2024)  Financial Resource Strain: Patient Declined (05/03/2023)  Physical Activity: Unknown (05/03/2023)  Social Connections: Unknown (05/03/2023)  Stress: Stress  Concern Present (05/03/2023)  Tobacco Use: Low Risk  (04/12/2024)    Readmission Risk Interventions     No data to display

## 2024-04-24 NOTE — Discharge Instructions (Signed)
MIDLINE WOUND CARE: - midline dressing to be changed daily - supplies: normal saline, kerlix/gauze, scissors, ABD pads, tape  - remove dressing and all packing carefully, moistening with sterile saline as needed to avoid packing/internal dressing sticking to the wound. - clean edges of skin around the wound with water/gauze, making sure there is no tape debris or leakage left on skin that could cause skin irritation or breakdown. - dampen a clean kerlix/gauze with saline and pack wound from wound base to skin level,  Wound can be packed loosely. Trim kerlix to size if a whole kerlix is not required. - cover wound with a dry ABD pad and secure with tape.  - apply any skin protectant/powder recommended by clinician to protect skin/skin folds. - change dressing as needed if leakage occurs, wound gets contaminated, or patient requests to shower. - patient may shower daily with wound open and following the shower the wound should be dried and a clean dressing placed.     CIRUGA: INSTRUCCIONES POSTOP (Ciruga de obstruccin del intestino delgado, reseccin de colon, etc.)   ############################################## ###################  COME Transicin gradual a una dieta alta en fibra con un suplemento de fibra durante los prximos das despus del alta  CAMINAR Camina una hora al da. Controla tu dolor para hacer eso.  CONTROLAR EL DOLOR Controle el dolor para que pueda caminar, dormir, tolerar estornudos/tos, subir/bajar escaleras.  TENGA UNA DEPOSICIN DIARIAMENTE Mantenga sus evacuaciones regulares para evitar problemas. Est bien probar un laxante para anular el estreimiento. Est bien usar un antidiarreico para retrasar la diarrea. Llame si no mejora despus de 2 intentos  LLAME SI TIENE PROBLEMAS/INQUIETUDES Llame si todava tiene problemas a pesar de seguir estas instrucciones. Llame si tiene inquietudes que no han sido respondidas por estas  instrucciones  ############################################## ###################   DIETA Seguir una dieta ligera los primeros das en casa. Comience con una dieta blanda como sopas, lquidos, alimentos ricos en almidn, alimentos bajos en grasa, etc. Si se siente lleno, hinchado o estreido, siga una dieta lquida completa o en pur/licuada durante unos das hasta que se sienta mejor y no ms tiempo estreido. Asegrese de beber muchos lquidos todos los das para evitar deshidratarse (sentirse mareado, no orinar, etc.). Agregue gradualmente un suplemento de fibra a su dieta durante la prxima semana. Vuelva gradualmente a una dieta slida regular. Evite la comida rpida o las comidas pesadas la primera semana, ya que es ms probable que tenga nuseas. Se espera que su tracto digestivo necesite algunos meses para volver a la normalidad. Es comn que sus evacuaciones intestinales y heces sean irregulares. Tendr hinchazn y calambres ocasionales que eventualmente desaparecern. Hasta que est comiendo alimentos slidos normalmente, deje de tomar todos los medicamentos para el dolor y regrese a sus actividades regulares; sus intestinos no sern normales. Concntrese en llevar una dieta baja en grasas y alta en fibra por el resto de su vida (consulte Cmo lograr una buena salud intestinal, a continuacin).  CUIDADO de su INCISIN o HERIDA Es bueno para incisiones cerradas e incluso heridas abiertas para lavarse todos los das. Dchate todos los das. Los baos cortos estn bien. Lave las incisiones y heridas con agua y jabn.  Si tiene una(s) incisin(es) cerrada(s), lvela con agua y jabn todos los das. Puede dejar las incisiones cerradas abiertas al aire si est seco. Puede cubrir la incisin con una gasa limpia y volver a colocarla despus de su ducha diaria para mayor comodidad.  Es bueno lavar las incisiones cerradas e incluso las   heridas abiertas todos los das. Dchate todos los das. Los  baos cortos estn bien. Lave las incisiones y heridas con agua y jabn. Puede dejar las incisiones cerradas abiertas al aire si est seco. Puede cubrir la incisin con una gasa limpia y volver a colocarla despus de su ducha diaria para mayor comodidad.  Si tiene una herida abierta con una aspiradora para heridas, consulte las instrucciones de cuidado de la aspiradora para heridas.   ACTIVIDADES segn tolerancia Inicie actividades diarias livianas (cuidado personal, caminar, subir escaleras) a partir del da posterior a la ciruga. Aumente gradualmente las actividades segn lo tolere. Controla tu dolor para estar activo. Detngase cuando est cansado. Lo ideal es caminar varias veces al da, eventualmente una hora al da. La mayora de las personas regresan a la mayora de las actividades cotidianas en unas pocas semanas. Se necesitan de 4 a 8 semanas para volver a la actividad intensa y sin restricciones. Si puede caminar 30 minutos sin dificultad, es seguro intentar una actividad ms intensa como trotar, caminar en una caminadora, andar en bicicleta, ejercicios aerbicos de bajo impacto, nadar, etc. Guarde la actividad ms intensa y extenuante para el final (por lo general, de 4 a 8 semanas despus de la ciruga), como abdominales, levantar objetos pesados, deportes de contacto, etc. Abstngase de cualquier trabajo pesado intenso o esfuerzo hasta que deje de tomar narcticos para controlar el dolor. Tendr das libres, pero las cosas deberan mejorar semana a semana. NO EMPUJE A TRAVS DEL DOLOR. Deja que el dolor sea tu gua: si te duele hacer algo, no lo hagas. El dolor es tu cuerpo advirtindote que evites esa actividad durante otra semana hasta que el dolorbaja. Puede conducir cuando ya no est tomando medicamentos narcticos recetados para el dolor, puede usar cmodamente el cinturn de seguridad y puede hacer giros/paradas repentinos de manera segura para protegerse sin vacilar debido al  dolor. Puede tener relaciones sexuales cuando le resulte cmodo. Si te duele hacer algo, detente.  MEDICAMENTOS Tome los medicamentos que le recetan normalmente en casa, a menos que le indiquen lo contrario. Anticoagulantes: Por lo general, puede reiniciar cualquier anticoagulante fuerte despus del segundo da posoperatorio. Est bien tomar una aspirina de inmediato.   Si est tomando anticoagulantes fuertes (warfarina/Coumadin, Plavix, Xerelto, Eliquis, Pradaxa, etc.), hable con su cirujano, mdico de atencin primaria y/o cardilogo para obtener instrucciones sobre cundo reiniciar el anticoagulante y si es necesario controlar la sangre. (anlisis de sangre PT/INR, etc.).  CONTROL DE DOLOR El dolor despus de la ciruga o relacionado con la actividad a menudo se debe a una tensin/lesin en el msculo, tendn, nervios y/o incisiones. Este dolor suele ser a corto plazo y mejorar en unos pocos meses. Para ayudar a acelerar el proceso de curacin y volver a la actividad normal ms rpidamente, HAGAN LAS SIGUIENTES COSAS JUNTOS: 1. Aumente la actividad gradualmente. NO EMPUJE A TRAVS DEL DOLOR 2. Usa Hielo y/o Calor 3. Prueba masajes suaves y/o estiramientos 4. Tome analgsicos de venta libre 5. Tome analgsicos recetados con narcticos para el dolor ms intenso  Buen control del dolor = recuperacin ms rpida. Es mejor tomar ms medicamentos para estar ms activo que quedarse en cama todo el da para evitar los medicamentos. 1. Incrementa la actividad gradualmente Evite levantar objetos pesados al principio, luego aumente a levantar segn lo tolere durante las prximas 6 semanas. No "empuje a travs" del dolor. Escucha a tu cuerpo y evita posiciones y maniobras que reproduzcan el dolor. Espera unos das antes de probar   algo ms intenso. Se recomienda caminar una hora al da para ayudar a que su cuerpo se recupere ms rpido y de manera ms segura. Comience lentamente y detngase cuando  sienta dolor. Si puedes caminar 30 minutos sin parar ni sentir dolor, puedes probar con actividades ms intensas (correr, trotar, aerbicos, andar en bicicleta, nadar, caminadora, sexo, deportes, levantamiento de pesas, etc.) Recuerda: si te duele hacerlo, no lo hagas! 2. Usa Hielo y/o Calor Tendr hinchazn y moretones alrededor de las incisiones. Esto tardar varias semanas en resolverse. Bolsas de hielo o almohadillas trmicas (6 a 8 veces al da, 30 a 60 minutos cada vez) ayudarn a aliviar el dolor y los moretones. Algunas personas prefieren usar hielo solo, calor solo o alternar entre hielo y calor. Experimente y vea qu funciona mejor para usted. Considere probar el hielo durante los primeros das para ayudar a disminuir la hinchazn y los moretones; luego, cambie a calor para ayudar a relajar los puntos doloridos y acelerar la recuperacin. Dchate todos los das. Los baos cortos estn bien. Se siente bien! Mantenga las incisiones y heridas limpias con agua y jabn. 3. Prueba masajes suaves y/o estiramientos Masaje en el rea del dolor muchas veces al da Detente si sientes dolor, no te excedas. 4. Tome analgsicos de venta libre Esto ayuda a que los tejidos musculares y nerviosos se vuelvan menos irritables y se calmen ms rpido. Elija UNO de los siguientes medicamentos antiinflamatorios de venta libre: Pastillas de 500 mg de acetaminofn (Tylenol) 1 o 2 pastillas con cada comida y justo antes de acostarse (evtelo si tiene problemas hepticos o si tiene acetaminofn en su receta de narcticos) Tabletas de naproxeno de 220 mg (p. ej., Aleve, Naprosyn) 1 o 2 tabletas dos veces al da (evtela si tiene problemas de rin, estmago, EII o sangrado) Pastillas de 200 mg de ibuprofeno (p. ej., Advil, Motrin) 3 o 4 pastillas con cada comida y justo antes de acostarse (evtelo si tiene problemas de rin, estmago, EII o sangrado) Tmelo con comida/refrigerio varias veces al da segn las  indicaciones durante al menos 2 semanas para ayudar a mantener el dolor/dolor bajo y ms manejable. 5. Tome analgsicos recetados con narcticos para el dolor ms intenso A menudo se le da una receta para un fuerte control del dolor despus del alta (por ejemplo: oxicodona/Percocet, hidrocodona/Norco/Vicodin o tramadol/Ultram) Tome su medicamento para el dolor segn lo prescrito. Tenga en cuenta que la mayora de las recetas de narcticos tambin contienen Tylenol (acetaminofeno); evite tomar demasiado Tylenol. Si tiene problemas/inquietudes con el medicamento recetado (no controla el dolor, las nuseas, los vmitos, el sarpullido, la picazn, etc.), llmenos al [phone] para ver si necesitamos cambiarlo por otro para el dolor. medicamento que funcionar mejor para usted y/o controlar mejor sus efectos secundarios. Si necesita un resurtido de su medicamento para el dolor, debe llamar a la oficina antes de las 4:00 p. m. y solo entre semana. Por ley federal, las recetas de narcticos no se pueden pedir en una farmacia. Deben ser llenados en papel y recogidos en nuestra oficina por el paciente o cuidador autorizado. Las recetas no se pueden surtir despus de las 4 pm ni los fines de semana.  CUANDO LLAMARNOS [phone] Dolor intenso no controlado o que empeora Fiebre superior a 101 F (38,5 C) Inquietudes con la incisin: Empeoramiento del dolor, enrojecimiento, sarpullido/urticaria, hinchazn, sangrado o drenaje Reacciones/problemas con nuevos medicamentos (picazn, sarpullido, urticaria, nuseas, etc.) Nuseas y d/o vmitos Dificultad para orinar Respiracin dificultosa Empeoramiento de la fatiga,   mareos, aturdimiento, visin borrosa Otras preocupaciones Si no mejora despus de dos semanas o nota que empeora, comunquese con nuestra oficina al [phone] para obtener ms consejos. Es posible que necesitemos ajustar sus medicamentos, volver a evaluarlo en el consultorio, enviarlo a la  sala de emergencias o ver qu otras cosas podemos hacer para ayudarlo. El personal de la clnica est disponible para responder sus preguntas durante el horario comercial habitual (8:30 a. m. a 5 p. m.). No dude en llamar y pedir hablar con uno de nuestros enfermeros si tiene inquietudes clnicas. Un cirujano de [facility] est siempre de guardia en los hospitales las 24 horas del da.  Si tiene una emergencia mdica, vaya a la sala de emergencias ms cercana o llame al 911.  SEGUIMIENTO en nuestra oficina El da de su alta del hospital (o el siguiente da laborable de la semana), llame a [facility] para programar o confirmar una cita para ver a su cirujano en el consultorio para una cita de seguimiento. Por lo general, es de 2 a 3 semanas despus de la ciruga. Si tiene grapas para la piel en su(s) incisin(es), infrmele al consultorio para que podamos programar un horario en el consultorio para que la enfermera se las quite (generalmente alrededor de 10 das despus de la ciruga). Asegrese de llamar para programar citas el da del alta (o el siguiente da laborable de la semana) del hospital para garantizar una hora de cita conveniente. SI TIENE FORMULARIOS DE DISCAPACIDAD O LICENCIA FAMILIAR, LLEVARLOS A LA OFICINA PARA PROCESARLOS. NO SE LOS D A SU MDICO.  Ciruga de [facility], [place] [address], [address], [place], [place] [address] ? [phone] - Principal [phone] - Nmero gratuito, [phone] - Fax [email or web address]   LLEGAR A UNA BUENA SALUD INTESTINAL. Se espera que su tracto digestivo necesite algunos meses para volver a la normalidad. Es comn que sus evacuaciones intestinales y heces sean irregulares. Tendr hinchazn y calambres ocasionales que eventualmente desaparecern. Hasta que est comiendo alimentos slidos normalmente, deje de tomar todos los medicamentos para el dolor y regrese a sus actividades  regulares; sus intestinos no sern normales. Evitar el estreimiento El objetivo: UNA EVACUACIN INTESTINAL SUAVE AL DA! Beber mucho lquido. Elige agua primero. TOMA UN SUPLEMENTO DE FIBRA CADA DA EL RESTO DE TU VIDA Durante su primera semana de regreso a casa, agregue gradualmente un suplemento de fibra todos los das. Experimente qu forma puede tolerar. Hay muchas formas, como polvos, tabletas, obleas, gomitas, etc. Salvado de psyllium (Metamucil), metilcelulosa (Citrucel), Miralax o Glycolax, Benefiber, Linaza. Ajuste la dosis semana a semana (1/2 dosis/da a 6 dosis al da) hasta que est defecando 1-2 veces al da. Reduzca la dosis o pruebe con un producto de fibra diferente si le causa problemas como diarrea o distensin abdominal. A veces, se necesita un laxante para ayudar a impulsar los intestinos si est estreido hasta que el suplemento de fibra pueda ayudar a regular sus intestinos. Si tolera comer y se est tirando pedos, est bien probar un laxante suave como la dosis doble de MiraLax, jugo de ciruela pasa o leche de magnesia. Evite el uso de laxantes con demasiada frecuencia. Los ablandadores de heces a veces pueden ayudar a contrarrestar los efectos de estreimiento de los analgsicos narcticos. Tambin puede causar diarrea, as que evite usarlo por mucho tiempo. Si todava est estreido a pesar de tomar fibra diariamente, comer slidos y algunas dosis de laxantes, llame a nuestra oficina. Controlando la diarrea Intente   beber lquidos y comer alimentos blandos durante unos das para evitar estresar an ms sus intestinos. Evite los productos lcteos (especialmente la leche y el helado) por un corto tiempo. Los intestinos a menudo pueden perder la capacidad de digerir la lactosa cuando estn estresados. Evite los alimentos que causan gases o hinchazn. Los alimentos tpicos incluyen frijoles y otras legumbres, repollo, brcoli y productos lcteos. Evite las comidas grasosas,  picantes y rpidas. Cada persona tiene cierta sensibilidad a otros alimentos, as que escuche a su cuerpo y evite aquellos alimentos que le provoquen problemas. Los probiticos (como el yogur activo, Align, etc.) pueden ayudar a repoblar los intestinos y el colon con bacterias normales y calmar un tracto digestivo sensible Agregar un suplemento de fibra gradualmente puede ayudar a espesar las heces al absorber el exceso de lquido y volver a entrenar a los intestinos para que acten con ms normalidad. Aumente lentamente la dosis durante unas pocas semanas. Demasiada fibra demasiado pronto puede ser contraproducente y causar calambres e hinchazn. Est bien tratar de disminuir la diarrea con unas pocas dosis de medicamentos antidiarreicos. El subsalicilato de bismuto (por ejemplo, Kayopectate, Pepto Bismol) en algunas dosis puede ayudar a controlar la diarrea. Evitar si est embarazada. La loperamida (Imodium) puede retrasar la diarrea. Comience con una tableta (2 mg) primero. Evtelo si tiene fiebre o dolor intenso. LOS PACIENTES CON ILEOSTOMA TENDRN DIARREA CRNICA ya que su colon no est en uso. Beba muchos lquidos. Deber beber incluso ms vasos de agua/lquido al da para evitar deshidratarse. Registre el resultado de su ileostoma. Espere vaciar la bolsa cada 3-4 horas al principio. La mayora de las personas con una ileostoma permanente vace su bolsa 4-6 veces como mnimo. Use medicamentos antidiarreicos (especialmente Imodium) varias veces al da para evitar deshidratarse. Comience con una dosis a la hora de acostarse y desayunar. Ajuste hacia arriba o hacia abajo segn sea necesario. Aumente los medicamentos antidiarreicos segn las indicaciones para evitar vaciar la bolsa ms de 8 veces al da (cada 3 horas). Trabaje con su enfermera de ostoma de heridas para aprender a cuidar su ostoma. Consulte las instrucciones de cuidado de la ostoma. RESOLUCIN DE PROBLEMAS DE INTESTINOS  IRREGULARES 1) Comience con una dieta suave y blanda. Nada de comidas picantes, grasosas o fritas. 2) Evite el gluten/trigo o productos lcteos de la dieta para ver si los sntomas mejoran. 3) Miralax 17gm o semillas de lino mezcladas en 8oz. agua o jugo-diariamente. Puede usar 2-4 veces al da segn sea necesario. 4) Gas-X, Phazyme, etc. segn sea necesario para gases e hinchazn. 5) Prilosec (omeprazol) de venta libre segn sea necesario 6) Considere los probiticos (Align, Activa, etc.) para ayudar a calmar los intestinos  Llame a su mdico si est empeorando o no est mejorando. A veces, se pueden necesitar ms pruebas (cultivos, endoscopia, estudios de rayos X, tomografas computarizadas, anlisis de sangre, etc.) para ayudar a diagnosticar y tratar la causa de la diarrea. Ciruga de [facility], [place] [address], [address], [place], [place] [address] [phone] - Principal. [phone] - Llamada gratuita. [phone] - Fax [email or web address]   ##########################################  SURGERY: POST OP INSTRUCTIONS (Surgery for small bowel obstruction, colon resection, etc)   ######################################################################  EAT Gradually transition to a high fiber diet with a fiber supplement over the next few days after discharge  WALK Walk an hour a day.  Control your pain to do that.    CONTROL PAIN Control pain so that you can walk, sleep, tolerate sneezing/coughing, go up/down stairs.    HAVE A BOWEL MOVEMENT DAILY Keep your bowels regular to avoid problems.  OK to try a laxative to override constipation.  OK to use an antidairrheal to slow down diarrhea.  Call if not better after 2 tries  CALL IF YOU HAVE PROBLEMS/CONCERNS Call if you are still struggling despite following these instructions. Call if you have concerns not answered by these  instructions  ######################################################################   DIET Follow a light diet the first few days at home.  Start with a bland diet such as soups, liquids, starchy foods, low fat foods, etc.  If you feel full, bloated, or constipated, stay on a ful liquid or pureed/blenderized diet for a few days until you feel better and no longer constipated. Be sure to drink plenty of fluids every day to avoid getting dehydrated (feeling dizzy, not urinating, etc.). Gradually add a fiber supplement to your diet over the next week.  Gradually get back to a regular solid diet.  Avoid fast food or heavy meals the first week as you are more likely to get nauseated. It is expected for your digestive tract to need a few months to get back to normal.  It is common for your bowel movements and stools to be irregular.  You will have occasional bloating and cramping that should eventually fade away.  Until you are eating solid food normally, off all pain medications, and back to regular activities; your bowels will not be normal. Focus on eating a low-fat, high fiber diet the rest of your life (See Getting to Good Bowel Health, below).  CARE of your INCISION or WOUND  It is good for closed incisions and even open wounds to be washed every day.  Shower every day.  Short baths are fine.  Wash the incisions and wounds clean with soap & water.    You may leave closed incisions open to air if it is dry.   You may cover the incision with clean gauze & replace it after your daily shower for comfort.  If you have an open wound with a wound vac, see wound vac care instructions.    ACTIVITIES as tolerated Start light daily activities --- self-care, walking, climbing stairs-- beginning the day after surgery.  Gradually increase activities as tolerated.  Control your pain to be active.  Stop when you are tired.  Ideally, walk several times a day, eventually an hour a day.   Most people are back  to most day-to-day activities in a few weeks.  It takes 4-8 weeks to get back to unrestricted, intense activity. If you can walk 30 minutes without difficulty, it is safe to try more intense activity such as jogging, treadmill, bicycling, low-impact aerobics, swimming, etc. Save the most intensive and strenuous activity for last (Usually 4-8 weeks after surgery) such as sit-ups, heavy lifting, contact sports, etc.  Refrain from any intense heavy lifting or straining until you are off narcotics for pain control.  You will have off days, but things should improve week-by-week. DO NOT PUSH THROUGH PAIN.  Let pain be your guide: If it hurts to do something, don't do it.  Pain is your body warning you to avoid that activity for another week until the pain goes down. You may drive when you are no longer taking narcotic prescription pain medication, you can comfortably wear a seatbelt, and you can safely make sudden turns/stops to protect yourself without hesitating due to pain. You may have sexual intercourse when it is comfortable. If it hurts to   do something, stop.  MEDICATIONS Take your usually prescribed home medications unless otherwise directed.   Blood thinners:  Usually you can restart any strong blood thinners after the second postoperative day.  It is OK to take aspirin right away.     If you are on strong blood thinners (warfarin/Coumadin, Plavix, Xerelto, Eliquis, Pradaxa, etc), discuss with your surgeon, medicine PCP, and/or cardiologist for instructions on when to restart the blood thinner & if blood monitoring is needed (PT/INR blood check, etc).     PAIN CONTROL Pain after surgery or related to activity is often due to strain/injury to muscle, tendon, nerves and/or incisions.  This pain is usually short-term and will improve in a few months.  To help speed the process of healing and to get back to regular activity more quickly, DO THE FOLLOWING THINGS TOGETHER: Increase activity gradually.   DO NOT PUSH THROUGH PAIN Use Ice and/or Heat Try Gentle Massage and/or Stretching Take over the counter pain medication Take Narcotic prescription pain medication for more severe pain  Good pain control = faster recovery.  It is better to take more medicine to be more active than to stay in bed all day to avoid medications.  Increase activity gradually Avoid heavy lifting at first, then increase to lifting as tolerated over the next 6 weeks. Do not "push through" the pain.  Listen to your body and avoid positions and maneuvers than reproduce the pain.  Wait a few days before trying something more intense Walking an hour a day is encouraged to help your body recover faster and more safely.  Start slowly and stop when getting sore.  If you can walk 30 minutes without stopping or pain, you can try more intense activity (running, jogging, aerobics, cycling, swimming, treadmill, sex, sports, weightlifting, etc.) Remember: If it hurts to do it, then don't do it! Use Ice and/or Heat You will have swelling and bruising around the incisions.  This will take several weeks to resolve. Ice packs or heating pads (6-8 times a day, 30-60 minutes at a time) will help sooth soreness & bruising. Some people prefer to use ice alone, heat alone, or alternate between ice & heat.  Experiment and see what works best for you.  Consider trying ice for the first few days to help decrease swelling and bruising; then, switch to heat to help relax sore spots and speed recovery. Shower every day.  Short baths are fine.  It feels good!  Keep the incisions and wounds clean with soap & water.   Try Gentle Massage and/or Stretching Massage at the area of pain many times a day Stop if you feel pain - do not overdo it Take over the counter pain medication This helps the muscle and nerve tissues become less irritable and calm down faster Choose ONE of the following over-the-counter anti-inflammatory medications: Acetaminophen  500mg tabs (Tylenol) 1-2 pills with every meal and just before bedtime (avoid if you have liver problems or if you have acetaminophen in you narcotic prescription) Naproxen 220mg tabs (ex. Aleve, Naprosyn) 1-2 pills twice a day (avoid if you have kidney, stomach, IBD, or bleeding problems) Ibuprofen 200mg tabs (ex. Advil, Motrin) 3-4 pills with every meal and just before bedtime (avoid if you have kidney, stomach, IBD, or bleeding problems) Take with food/snack several times a day as directed for at least 2 weeks to help keep pain / soreness down & more manageable. Take Narcotic prescription pain medication for more severe pain A prescription for   strong pain control is often given to you upon discharge (for example: oxycodone/Percocet, hydrocodone/Norco/Vicodin, or tramadol/Ultram) Take your pain medication as prescribed. Be mindful that most narcotic prescriptions contain Tylenol (acetaminophen) as well - avoid taking too much Tylenol. If you are having problems/concerns with the prescription medicine (does not control pain, nausea, vomiting, rash, itching, etc.), please call us [phone] to see if we need to switch you to a different pain medicine that will work better for you and/or control your side effects better. If you need a refill on your pain medication, you must call the office before 4 pm and on weekdays only.  By federal law, prescriptions for narcotics cannot be called into a pharmacy.  They must be filled out on paper & picked up from our office by the patient or authorized caretaker.  Prescriptions cannot be filled after 4 pm nor on weekends.    WHEN TO CALL US [phone] Severe uncontrolled or worsening pain  Fever over 101 F (38.5 C) Concerns with the incision: Worsening pain, redness, rash/hives, swelling, bleeding, or drainage Reactions / problems with new medications (itching, rash, hives, nausea, etc.) Nausea and/or vomiting Difficulty urinating Difficulty  breathing Worsening fatigue, dizziness, lightheadedness, blurred vision Other concerns If you are not getting better after two weeks or are noticing you are getting worse, contact our office [phone] for further advice.  We may need to adjust your medications, re-evaluate you in the office, send you to the emergency room, or see what other things we can do to help. The clinic staff is available to answer your questions during regular business hours (8:30am-5pm).  Please don't hesitate to call and ask to speak to one of our nurses for clinical concerns.    A surgeon from [facility] is always on call at the hospitals 24 hours/day If you have a medical emergency, go to the nearest emergency room or call 911.  FOLLOW UP in our office One the day of your discharge from the hospital (or the next business weekday), please call [facility] to set up or confirm an appointment to see your surgeon in the office for a follow-up appointment.  Usually it is 2-3 weeks after your surgery.   If you have skin staples at your incision(s), let the office know so we can set up a time in the office for the nurse to remove them (usually around 10 days after surgery). Make sure that you call for appointments the day of discharge (or the next business weekday) from the hospital to ensure a convenient appointment time. IF YOU HAVE DISABILITY OR FAMILY LEAVE FORMS, BRING THEM TO THE OFFICE FOR PROCESSING.  DO NOT GIVE THEM TO YOUR DOCTOR.  [facility] [address], [address], [place], [place]  [address] ? [phone] - Main [phone] - Toll Free,  [phone] - Fax [email or web address]    GETTING TO GOOD BOWEL HEALTH. It is expected for your digestive tract to need a few months to get back to normal.  It is common for your bowel movements and stools to be irregular.  You will have occasional bloating and cramping that should eventually fade  away.  Until you are eating solid food normally, off all pain medications, and back to regular activities; your bowels will not be normal.   Avoiding constipation The goal: ONE SOFT BOWEL MOVEMENT A DAY!    Drink plenty of fluids.  Choose water first. TAKE A FIBER SUPPLEMENT EVERY DAY   THE REST OF YOUR LIFE During your first week back home, gradually add back a fiber supplement every day Experiment which form you can tolerate.   There are many forms such as powders, tablets, wafers, gummies, etc Psyllium bran (Metamucil), methylcellulose (Citrucel), Miralax or Glycolax, Benefiber, Flax Seed.  Adjust the dose week-by-week (1/2 dose/day to 6 doses a day) until you are moving your bowels 1-2 times a day.  Cut back the dose or try a different fiber product if it is giving you problems such as diarrhea or bloating. Sometimes a laxative is needed to help jump-start bowels if constipated until the fiber supplement can help regulate your bowels.  If you are tolerating eating & you are farting, it is okay to try a gentle laxative such as double dose MiraLax, prune juice, or Milk of Magnesia.  Avoid using laxatives too often. Stool softeners can sometimes help counteract the constipating effects of narcotic pain medicines.  It can also cause diarrhea, so avoid using for too long. If you are still constipated despite taking fiber daily, eating solids, and a few doses of laxatives, call our office. Controlling diarrhea Try drinking liquids and eating bland foods for a few days to avoid stressing your intestines further. Avoid dairy products (especially milk & ice cream) for a short time.  The intestines often can lose the ability to digest lactose when stressed. Avoid foods that cause gassiness or bloating.  Typical foods include beans and other legumes, cabbage, broccoli, and dairy foods.  Avoid greasy, spicy, fast foods.  Every person has some sensitivity to other foods, so listen to your body and avoid those  foods that trigger problems for you. Probiotics (such as active yogurt, Align, etc) may help repopulate the intestines and colon with normal bacteria and calm down a sensitive digestive tract Adding a fiber supplement gradually can help thicken stools by absorbing excess fluid and retrain the intestines to act more normally.  Slowly increase the dose over a few weeks.  Too much fiber too soon can backfire and cause cramping & bloating. It is okay to try and slow down diarrhea with a few doses of antidiarrheal medicines.   Bismuth subsalicylate (ex. Kayopectate, Pepto Bismol) for a few doses can help control diarrhea.  Avoid if pregnant.   Loperamide (Imodium) can slow down diarrhea.  Start with one tablet (2mg) first.  Avoid if you are having fevers or severe pain.  ILEOSTOMY PATIENTS WILL HAVE CHRONIC DIARRHEA since their colon is not in use.    Drink plenty of liquids.  You will need to drink even more glasses of water/liquid a day to avoid getting dehydrated. Record output from your ileostomy.  Expect to empty the bag every 3-4 hours at first.  Most people with a permanent ileostomy empty their bag 4-6 times at the least.   Use antidiarrheal medicine (especially Imodium) several times a day to avoid getting dehydrated.  Start with a dose at bedtime & breakfast.  Adjust up or down as needed.  Increase antidiarrheal medications as directed to avoid emptying the bag more than 8 times a day (every 3 hours). Work with your wound ostomy nurse to learn care for your ostomy.  See ostomy care instructions. TROUBLESHOOTING IRREGULAR BOWELS 1) Start with a soft & bland diet. No spicy, greasy, or fried foods.  2) Avoid gluten/wheat or dairy products from diet to see if symptoms improve. 3) Miralax 17gm or flax seed mixed in 8oz. water or juice-daily. May use 2-4 times a   day as needed. 4) Gas-X, Phazyme, etc. as needed for gas & bloating.  5) Prilosec (omeprazole) over-the-counter as needed 6)  Consider  probiotics (Align, Activa, etc) to help calm the bowels down  Call your doctor if you are getting worse or not getting better.  Sometimes further testing (cultures, endoscopy, X-ray studies, CT scans, bloodwork, etc.) may be needed to help diagnose and treat the cause of the diarrhea. [facility] [address], [address], [place], [place]  [address] [phone] - Main.    [phone]  - Toll Free.   [phone] - Fax [email or web address]   #######################################################  Ostomy Support Information  You've heard that people get along just fine with only one of their eyes, or one of their lungs, or one of their kidneys. But you also know that you have only one intestine and only one bladder, and that leaves you feeling awfully empty, both physically and emotionally: You think no other people go around without part of their intestine with the ends of their intestines sticking out through their abdominal walls.   YOU ARE NOT ALONE.  There are nearly three quarters of a million people in the [place] who have an ostomy; people who have had surgery to remove all or part of their colons or bladders.   There is even a national association, the [employer] with over 350 local affiliated support groups that are organized by volunteers who provide peer support and counseling. [employer] has a toll free telephone num-ber, 800-826-0826 and an educational, interactive website, www.ostomy.org   An ostomy is an opening in the belly (abdominal wall) made by surgery. Ostomates are people who have had this procedure. The opening (stoma) allows the kidney or bowel to grdischarge waste. An external pouch covers the stoma to collect waste. Pouches are are a simple bag and are odor free. Different companies have disposable or reusable pouches to fit one's lifestyle. An ostomy can either be temporary or permanent.   THERE ARE THREE MAIN  TYPES OF OSTOMIES Colostomy. A colostomy is a surgically created opening in the large intestine (colon). Ileostomy. An ileostomy is a surgically created opening in the small intestine. Urostomy. A urostomy is a surgically created opening to divert urine away from the bladder.  OSTOMY Care  The following guidelines will make care of your colostomy easier. Keep this information close by for quick reference.  Helpful DIET hints Eat a well-balanced diet including vegetables and fresh fruits. Eat on a regular schedule.  Drink at least 6 to 8 glasses of fluids daily. Eat slowly in a relaxed atmosphere. Chew your food thoroughly. Avoid chewing gum, smoking, and drinking from a straw. This will help decrease the amount of air you swallow, which may help reduce gas. Eating yogurt or drinking buttermilk may help reduce gas.  To control gas at night, do not eat after 8 p.m. This will give your bowel time to quiet down before you go to bed.  If gas is a problem, you can purchase Beano. Sprinkle Beano on the first bite of food before eating to reduce gas. It has no flavor and should not change the taste of your food. You can buy Beano over the counter at your local drugstore.  Foods like fish, onions, garlic, broccoli, asparagus, and cabbage produce odor. Although your pouch is odor-proof, if you eat these foods you may notice a stronger odor when emptying your pouch. If this is a concern, you may want to   limit these foods in your diet.  If you have an ileostomy, you will have chronic diarrhea & need to drink more liquids to avoid getting dehydrated.  Consider antidiarrheal medicine like imodium (loperamide) or Lomotil to help slow down bowel movements / diarrhea into your ileostomy bag.  GETTING TO GOOD BOWEL HEALTH WITH AN ILEOSTOMY    With the colon bypassed & not in use, you will have small bowel diarrhea.   It is important to thicken & slow your bowel movements down.   The goal: 4-6 small BOWEL  MOVEMENTS A DAY It is important to drink plenty of liquids to avoid getting dehydrated  CONTROLLING ILEOSTOMY DIARRHEA  TAKE A FIBER SUPPLEMENT (FiberCon or Benefiner soluble fiber) twice a day - to thicken stools by absorbing excess fluid and retrain the intestines to act more normally.  Slowly increase the dose over a few weeks.  Too much fiber too soon can backfire and cause cramping & bloating.  TAKE AN IRON SUPPLEMENT twice a day to naturally constipate your bowels.  Usually ferrous sulfate 325mg twice a day)  TAKE ANTI-DIARRHEAL MEDICINES: Loperamide (Imodium) can slow down diarrhea.  Start with two tablets (= 4mg) first and then try one tablet every 6 hours.  Can go up to 2 pills four times day (8 pills of 2mg max) Avoid if you are having fevers or severe pain.  If you are not better or start feeling worse, stop all medicines and call your doctor for advice LoMotil (Diphenoxylate / Atropine) is another medicine that can constipate & slow down bowel moevements Pepto Bismol (bismuth) can gently thicken bowels as well  If diarrhea is worse,: drink plenty of liquids and try simpler foods for a few days to avoid stressing your intestines further. Avoid dairy products (especially milk & ice cream) for a short time.  The intestines often can lose the ability to digest lactose when stressed. Avoid foods that cause gassiness or bloating.  Typical foods include beans and other legumes, cabbage, broccoli, and dairy foods.  Every person has some sensitivity to other foods, so listen to our body and avoid those foods that trigger problems for you.Call your doctor if you are getting worse or not better.  Sometimes further testing (cultures, endoscopy, X-ray studies, bloodwork, etc) may be needed to help diagnose and treat the cause of the diarrhea. Take extra anti-diarrheal medicines (maximum is 8 pills of 2mg loperamide a day)   Tips for POUCHING an OSTOMY   Changing Your Pouch The best time to  change your pouch is in the morning, before eating or drinking anything. Your stoma can function at any time, but it will function more after eating or drinking.   Applying the pouching system  Place all your equipment close at hand before removing your pouch.  Wash your hands.  Stand or sit in front of a mirror. Use the position that works best for you. Remember that you must keep the skin around the stoma wrinkle-free for a good seal.  Gently remove the used pouch (1-piece system) or the pouch and old wafer (2-piece system). Empty the pouch into the toilet. Save the closure clip to use again.  Wash the stoma itself and the skin around the stoma. Your stoma may bleed a little when being washed. This is normal. Rinse and pat dry. You may use a wash cloth or soft paper towels (like Bounty), mild soap (like Dial, Safeguard, or Ivory), and water. Avoid soaps that contain perfumes or lotions.    For a new pouch (1-piece system) or a new wafer (2-piece system), measure your stoma using the stoma guide in each box of supplies.  Trace the shape of your stoma onto the back of the new pouch or the back of the new wafer. Cut out the opening. Remove the paper backing and set it aside.  Optional: Apply a skin barrier powder to surrounding skin if it is irritated (bare or weeping), and dust off the excess. Optional: Apply a skin-prep wipe (such as Skin Prep or All-Kare) to the skin around the stoma, and let it dry. Do not apply this solution if the skin is irritated (red, tender, or broken) or if you have shaved around the stoma. Optional: Apply a skin barrier paste (such as Stomahesive, Coloplast, or Premium) around the opening cut in the back of the pouch or wafer. Allow it to dry for 30 to 60 seconds.  Hold the pouch (1-piece system) or wafer (2-piece system) with the sticky side toward your body. Make sure the skin around the stoma is wrinkle-free. Center the opening on the stoma, then press firmly to  your abdomen (Fig. 4). Look in the mirror to check if you are placing the pouch, or wafer, in the right position. For a 2-piece system, snap the pouch onto the wafer. Make sure it snaps into place securely.  Place your hand over the stoma and the pouch or wafer for about 30 seconds. The heat from your hand can help the pouch or wafer stick to your skin.  Add deodorant (such as Super Banish or Nullo) to your pouch. Other options include food extracts such as vanilla oil and peppermint extract. Add about 10 drops of the deodorant to the pouch. Then apply the closure clamp. Note: Do not use toxic  chemicals or commercial cleaning agents in your pouch. These substances may harm the stoma.  Optional: For extra seal, apply tape to all 4 sides around the pouch or wafer, as if you were framing a picture. You may use any brand of medical adhesive tape. Change your pouch every 5 to 7 days. Change it immediately if a leak occurs.  Wash your hands afterwards.  If you are wearing a 2-piece system, you may use 2 new pouches per week and alternate them. Rinse the pouch with mild soap and warm water and hang it to dry for the next day. Apply the fresh pouch. Alternate the 2 pouches like this for a week. After a week, change the wafer and begin with 2 new pouches. Place the old pouches in a plastic bag, and put them in the trash.   LIVING WITH AN OSTOMY  Emptying Your Pouch Empty your pouch when it is one-third full (of urine, stool, and/or gas). If you wait until your pouch is fuller than this, it will be more difficult to empty and more noticeable. When you empty your pouch, either put toilet paper in the toilet bowl first, or flush the toilet while you empty the pouch. This will reduce splashing. You can empty the pouch between your legs or to one side while sitting, or while standing or stooping. If you have a 2-piece system, you can snap off the pouch to empty it. Remember that your stoma may function during  this time. If you wish to rinse your pouch after you empty it, a turkey baster can be helpful. When using a baster, squirt water up into the pouch through the opening at the bottom. With a 2-piece system,   you can snap off the pouch to rinse it. After rinsing  your pouch, empty it into the toilet. When rinsing your pouch at home, put a few granules of Dreft soap in the rinse water. This helps lubricate and freshen your pouch. The inside of your pouch can be sprayed with non-stick cooking oil (Pam spray). This may help reduce stool sticking to the inside of the pouch.  Bathing You may shower or bathe with your pouch on or off. Remember that your stoma may function during this time.  The materials you use to wash your stoma and the skin around it should be clean, but they do not need to be sterile.  Wearing Your Pouch During hot weather, or if you perspire a lot in general, wear a cover over your pouch. This may prevent a rash on your skin under the pouch. Pouch covers are sold at ostomy supply stores. Wear the pouch inside your underwear for better support. Watch your weight. Any gain or loss of 10 to 15 pounds or more can change the way your pouch fits.  Going Away From Home A collapsible cup (like those that come in travel kits) or a soft plastic squirt bottle with a pull-up top (like a travel bottle for shampoo) can be used for rinsing your pouch when you are away from home. Tilt the opening of the pouch at an upward angle when using a cup to rinse.  Carry wet wipes or extra tissues to use in public bathrooms.  Carry an extra pouching system with you at all times.  Never keep ostomy supplies in the glove compartment of your car. Extreme heat or cold can damage the skin barriers and adhesive wafers on the pouch.  When you travel, carry your ostomy supplies with you at all times. Keep them within easy reach. Do not pack ostomy supplies in baggage that will be checked or otherwise separated  from you, because your baggage might be lost. If you're traveling out of the country, it is helpful to have a letter stating that you are carrying ostomy supplies as a medical necessity.  If you need ostomy supplies while traveling, look in the yellow pages of the telephone book under "Surgical Supplies." Or call the local ostomy organization to find out where supplies are available.  Do not let your ostomy supplies get low. Always order new pouches before you use the last one.  Reducing Odor Limit foods such as broccoli, cabbage, onions, fish, and garlic in your diet to help reduce odor. Each time you empty your pouch, carefully clean the opening of the pouch, both inside and outside, with toilet paper. Rinse your pouch 1 or 2 times daily after you empty it (see directions for emptying your pouch and going away from home). Add deodorant (such as Super Banish or Nullo) to your pouch. Use air deodorizers in your bathroom. Do not add aspirin to your pouch. Even though aspirin can help prevent odor, it could cause ulcers on your stoma.  When to call the doctor Call the doctor if you have any of the following symptoms: Purple, black, or white stoma Severe cramps lasting more than 6 hours Severe watery discharge from the stoma lasting more than 6 hours No output from the colostomy for 3 days Excessive bleeding from your stoma Swelling of your stoma to more than 1/2-inch larger than usual Pulling inward of your stoma below skin level Severe skin irritation or deep ulcers Bulging or other changes in your abdomen    When to call your ostomy nurse Call your ostomy/enterostomal therapy (WOCN) nurse if any of the following occurs: Frequent leaking of your pouching system Change in size or appearance of your stoma, causing discomfort or problems with your pouch Skin rash or rawness Weight gain or loss that causes problems with your pouch     FREQUENTLY ASKED QUESTIONS   Why haven't you met  any of these folks who have an ostomy?  Well, maybe you have! You just did not recognize them because an ostomy doesn't show. It can be kept secret if you wish. Why, maybe some of your best friends, office associates or neighbors have an ostomy ... you never can tell. People facing ostomy surgery have many quality-of-life questions like: Will you bulge? Smell? Make noises? Will you feel waste leaving your body? Will you be a captive of the toilet? Will you starve? Be a social outcast? Get/stay married? Have babies? Easily bathe, go swimming, bend over?  OK, let's look at what you can expect:   Will you bulge?  Remember, without part of the intestine or bladder, and its contents, you should have a flatter tummy than before. You can expect to wear, with little exception, what you wore before surgery ... and this in-cludes tight clothing and bathing suits.   Will you smell?  Today, thanks to modern odor proof pouching systems, you can walk into an ostomy support group meeting and not smell anything that is foul or offensive. And, for those with an ileostomy or colostomy who are concerned about odor when emptying their pouch, there are in-pouch deodorants that can be used to eliminate any waste odors that may exist.   Will you make noises?  Everyone produces gas, especially if they are an air-swallower. But intestinal sounds that occur from time to time are no differ-ent than a gurgling tummy, and quite often your clothing will muffle any sounds.   Will you feel the waste discharges?  For those with a colostomy or ileostomy there might be a slight pressure when waste leaves your body, but understand that the intestines have no nerve endings, so there will be no unpleasant sensations. Those with a urostomy will probably be unaware of any kidney drainage.   Will you be a captive of the toilet?  Immediately post-op you will spend more time in the bathroom than you will after your body recovers from  surgery. Every person is different, but on average those with an ileostomy or urostomy may empty their pouches 4 to 6 times a day; a little  less if you have a colostomy. The average wear time between pouch system changes is 3 to 5 days and the changing process should take less than 30 minutes.   Will I need to be on a special diet? Most people return to their normal diet when they have recovered from surgery. Be sure to chew your food well, eat a well-balanced diet and drink plenty of fluids. If you experience problems with a certain food, wait a couple of weeks and try it again.  Will there be odor and noises? Pouching systems are designed to be odor-proof or odor-resistant. There are deodorants that can be used in the pouch. Medications are also available to help reduce odor. Limit gas-producing foods and carbonated beverages. You will experience less gas and fewer noises as you heal from surgery.  How much time will it take to care for my ostomy? At first, you may spend a lot of time learning   about your ostomy and how to take care of it. As you become more comfortable and skilled at changing the pouching system, it will take very little time to care for it.   Will I be able to return to work? People with ostomies can perform most jobs. As soon as you have healed from surgery, you should be able to return to work. Heavy lifting (more than 10 pounds) may be discouraged.   What about intimacy? Sexual relationships and intimacy are important and fulfilling aspects of your life. They should continue after ostomy surgery. Intimacy-related concerns should be discussed openly between you and your partner.   Can I wear regular clothing? You do not need to wear special clothing. Ostomy pouches are fairly flat and barely noticeable. Elastic undergarments will not hurt the stoma or prevent the ostomy from functioning.   Can I participate in sports? An ostomy should not limit your involvement in sports.  Many people with ostomies are runners, skiers, swimmers or participate in other active lifestyles. Talk with your caregiver first before doing heavy physical activity.  Will you starve?  Not if you follow doctor's orders at each stage of your post-op adjustment. There is no such thing as an "ostomy diet". Some people with an ostomy will be able to eat and tolerate anything; others may find diffi-culty with some foods. Each person is an individual and must determine, by trial, what is best for them. A good practice for all is to drink plenty of water.   Will you be a social outcast?  Have you met anyone who has an ostomy and is a social outcast? Why should you be the first? Only your attitude and self image will effect how you are treated. No confi-dent person is an outcast.    PROFESSIONAL HELP   Resources are available if you need help or have questions about your ostomy.   Specially trained nurses called Wound, Ostomy Continence Nurses (WOCN) are available for consultation in most major medical centers.  Consider getting an ostomy consult at an outpatient ostomy clinic.   [facility] has an Ostomy Clinic run by an WOCN ostomy nurse at the [facility].  [phone]. [facility] can help set up an appointment   The United Ostomy Association (UOA) is a group made up of many local chapters throughout the [place]. These local groups hold meetings and provide support to prospective and existing ostomates. They sponsor educational events and have qualified visitors to make personal or telephone visits. Contact the UOA for the chapter nearest you and for other educational publications.  More detailed information can be found in Colostomy Guide, a publication of the United Ostomy Association (UOA). Contact UOA at 1-800-826-0826 or visit their web site at www.uoaa.org. The website contains links to other sites, suppliers and resources.  [facility] Secure Start  Services: Start at the website to enlist for support.  Your Wound Ostomy (WOCN) nurse may have started this process. https://www.[employer].com/en/securestart Secure Start services are designed to support people as they live their lives with an ostomy or neurogenic bladder. Enrolling is easy and at no cost to the patient. We realize that each person's needs and life journey are different. Through Secure Start services, we want to help people live their life, their way.  #######################################################   Managing Your Pain After Surgery Without Opioids    Thank you for participating in our program to help patients manage their pain after surgery without opioids. This is part of   our effort to provide you with the best care possible, without exposing you or your family to the risk that opioids pose.  What pain can I expect after surgery? You can expect to have some pain after surgery. This is normal. The pain is typically worse the day after surgery, and quickly begins to get better. Many studies have found that many patients are able to manage their pain after surgery with Over-the-Counter (OTC) medications such as Tylenol and Motrin. If you have a condition that does not allow you to take Tylenol or Motrin, notify your surgical team.  How will I manage my pain? The best strategy for controlling your pain after surgery is around the clock pain control with Tylenol (acetaminophen) and Motrin (ibuprofen or Advil). Alternating these medications with each other allows you to maximize your pain control. In addition to Tylenol and Motrin, you can use heating pads or ice packs on your incisions to help reduce your pain.  How will I alternate your regular strength over-the-counter pain medication? You will take a dose of pain medication every three hours. Start by taking 650 mg of Tylenol (2 pills of 325 mg) 3 hours later take 600 mg of Motrin (3 pills of 200 mg) 3 hours after  taking the Motrin take 650 mg of Tylenol 3 hours after that take 600 mg of Motrin.   - 1 -  See example - if your first dose of Tylenol is at 12:00 PM   12:00 PM Tylenol 650 mg (2 pills of 325 mg)  3:00 PM Motrin 600 mg (3 pills of 200 mg)  6:00 PM Tylenol 650 mg (2 pills of 325 mg)  9:00 PM Motrin 600 mg (3 pills of 200 mg)  Continue alternating every 3 hours   We recommend that you follow this schedule around-the-clock for at least 3 days after surgery, or until you feel that it is no longer needed. Use the table on the last page of this handout to keep track of the medications you are taking. Important: Do not take more than 3000mg of Tylenol or 3200mg of Motrin in a 24-hour period. Do not take ibuprofen/Motrin if you have a history of bleeding stomach ulcers, severe kidney disease, &/or actively taking a blood thinner  What if I still have pain? If you have pain that is not controlled with the over-the-counter pain medications (Tylenol and Motrin or Advil) you might have what we call "breakthrough" pain. You will receive a prescription for a small amount of an opioid pain medication such as Oxycodone, Tramadol, or Tylenol with Codeine. Use these opioid pills in the first 24 hours after surgery if you have breakthrough pain. Do not take more than 1 pill every 4-6 hours.  If you still have uncontrolled pain after using all opioid pills, don't hesitate to call our staff using the number provided. We will help make sure you are managing your pain in the best way possible, and if necessary, we can provide a prescription for additional pain medication.   Day 1    Time  Name of Medication Number of pills taken  Amount of Acetaminophen  Pain Level   Comments  AM PM       AM PM       AM PM       AM PM       AM PM       AM PM       AM PM         AM PM       Total Daily amount of Acetaminophen Do not take more than  3,000 mg per day      Day 2    Time  Name of Medication  Number of pills taken  Amount of Acetaminophen  Pain Level   Comments  AM PM       AM PM       AM PM       AM PM       AM PM       AM PM       AM PM       AM PM       Total Daily amount of Acetaminophen Do not take more than  3,000 mg per day      Day 3    Time  Name of Medication Number of pills taken  Amount of Acetaminophen  Pain Level   Comments  AM PM       AM PM       AM PM       AM PM         AM PM       AM PM       AM PM       AM PM       Total Daily amount of Acetaminophen Do not take more than  3,000 mg per day      Day 4    Time  Name of Medication Number of pills taken  Amount of Acetaminophen  Pain Level   Comments  AM PM       AM PM       AM PM       AM PM       AM PM       AM PM       AM PM       AM PM       Total Daily amount of Acetaminophen Do not take more than  3,000 mg per day      Day 5    Time  Name of Medication Number of pills taken  Amount of Acetaminophen  Pain Level   Comments  AM PM       AM PM       AM PM       AM PM       AM PM       AM PM       AM PM       AM PM       Total Daily amount of Acetaminophen Do not take more than  3,000 mg per day      Day 6    Time  Name of Medication Number of pills taken  Amount of Acetaminophen  Pain Level  Comments  AM PM       AM PM       AM PM       AM PM       AM PM       AM PM       AM PM       AM PM       Total Daily amount of Acetaminophen Do not take more than  3,000 mg per day      Day 7    Time  Name of Medication Number of pills taken  Amount of Acetaminophen  Pain Level   Comments    AM PM       AM PM       AM PM       AM PM       AM PM       AM PM       AM PM       AM PM       Total Daily amount of Acetaminophen Do not take more than  3,000 mg per day        For additional information about how and where to safely dispose of unused opioid medications - https://www.morepowerfulnc.org  Disclaimer: This document contains  information and/or instructional materials adapted from [facility] for the typical patient with your condition. It does not replace medical advice from your health care provider because your experience may differ from that of the typical patient. Talk to your health care provider if you have any questions about this document, your condition or your treatment plan. Adapted from [facility]  

## 2024-04-24 NOTE — Progress Notes (Signed)
 PATIENT NAVIGATOR PROGRESS NOTE  Name: RHAPSODY BENKER Date: 04/24/2024 MRN: [id number]  DOB: 1971/05/25   Reason for visit:  Introductory phone call  Comments:  Called and spoke with patient regarding new pt appt Scheduled for Monday May 5 at 1:40 pm with Dr Scherrie Curt  Discussed directions to building and parking and that one person is allowed inappt    Time spent counseling/coordinating care: 30-45 minutes

## 2024-04-24 NOTE — Progress Notes (Signed)
 Patients Wound Vac switched to portable home wound vac. Patient, daughter, and mother at bedside were educated on tips and tricks and troubleshooting as well as what to look out for. JP drain education given to patient and daughter as well and daughter showed that she was able to empty drain and apply suction appropriately. AVS went over with family at bedside. Patient has no further questions- sent with wound vac as well as wound vac supplies. Walker and commode bother will be delivered to their home within 1-2 business days. Patient discharged out of hospital via wheelchair with staff.

## 2024-04-27 NOTE — Discharge Summary (Signed)
 [facility] Discharge Summary   Patient ID: Erin Bradford MRN: [id number] DOB/AGE: 55/06/1971 55 y.o.  Admit date: 04/11/2024 Discharge date: 04/27/2024  Admitting Diagnosis: Appendicitis with abscess [K35.33] Acute cystitis without hematuria [N30.00] AKI (acute kidney injury) (HCC) [N17.9] Perforated appendicitis [K35.32] Acute appendicitis with localized peritonitis and abscess, without gangrene or perforation [K35.30] Acute perforated appendicitis [K35.32]   Discharge Diagnosis Appendicitis with abscess [K35.33] Acute cystitis without hematuria [N30.00] AKI (acute kidney injury) (HCC) [N17.9] Perforated appendicitis [K35.32] Acute appendicitis with localized peritonitis and abscess, without gangrene or perforation [K35.30] Acute perforated appendicitis [K35.32] Stage 3 adenocarcinoma of the colon  Consultants None  Imaging: No results found.  Procedures Dr. Alethea Andes (04/12/24) - Laparoscopic converted to open ileocecectomy for perforated appendicitis  Hospital Course:  53 y.o. female who presented to the ED with abdominal pain.  Workup showed perforated appendicitis with abscess.  Patient was admitted and underwent procedure listed above.  Tolerated procedure well and was transferred to the floor.  Diet was advanced as tolerated.  A wound vac was applied to her midline wound and HH wound vac assistance arranged. Pathology returned with stage 3 colon cancer and CT chest was completed for staging with findings of bilateral pulmonary nodules and soft tissue density in the mediastinum. Findings and patient case discussed with oncology and follow up arranged. On POD12, the patient was voiding well, tolerating diet, ambulating well, pain well controlled, vital signs stable, incision c/d/i and felt stable for discharge home. She was discharged with JP drain in place.  Patient will follow up in our office in 3 weeks and knows to call with questions or concerns. She will  follow up in approximately one week for drain check. She is on a pain contract at baseline and her pain medications were discussed with outpatient provider who will manage ongoing outpatient pain control.   I or a member of my team have reviewed this patient in the Controlled Substance Database.  Allergies as of 04/24/2024       Reactions   Ace Inhibitors Anaphylaxis, Swelling   Swelling of tongue.        Medication List     STOP taking these medications    losartan  100 MG tablet Commonly known as: COZAAR    nitrofurantoin  (macrocrystal-monohydrate) 100 MG capsule Commonly known as: Macrobid    phenazopyridine  200 MG tablet Commonly known as: Pyridium        TAKE these medications    acetaminophen  500 MG tablet Commonly known as: TYLENOL  Take 2 tablets (1,000 mg total) by mouth every 6 (six) hours.   Amitiza 24 MCG capsule Generic drug: lubiprostone Take 24 mcg by mouth daily as needed for constipation.   buPROPion  300 MG 24 hr tablet Commonly known as: WELLBUTRIN  XL Take 1 tablet (300 mg total) by mouth daily.   carvedilol  25 MG tablet Commonly known as: COREG  TAKE 1 TABLET BY MOUTH TWICE  DAILY   cetirizine  10 MG tablet Commonly known as: ZYRTEC  TAKE 1 TABLET(10 MG) BY MOUTH AT BEDTIME AS NEEDED FOR ALLERGIES What changed:  how much to take how to take this when to take this   famotidine  20 MG tablet Commonly known as: PEPCID  TAKE 1 TABLET(20 MG) BY MOUTH TWICE DAILY   fluticasone  50 MCG/ACT nasal spray Commonly known as: FLONASE  SHAKE LIQUID AND USE 2 SPRAYS IN EACH NOSTRIL DAILY What changed:  how much to take how to take this when to take this reasons to take this additional instructions   furosemide  20 MG  tablet Commonly known as: LASIX  Take 20 mg by mouth 2 (two) times daily.   gabapentin  300 MG capsule Commonly known as: NEURONTIN  Take 2 capsules by mouth 3 (three) times daily.   Narcan  4 MG/0.1ML Liqd nasal spray kit Generic drug:  naloxone  Place 1 spray into the nose as directed.   oxybutynin  10 MG 24 hr tablet Commonly known as: DITROPAN -XL TAKE 1 TABLET BY MOUTH AT  BEDTIME   oxyCODONE -acetaminophen  10-325 MG tablet Commonly known as: PERCOCET Take 1 tablet by mouth 3 (three) times daily as needed for pain.   primidone  50 MG tablet Commonly known as: MYSOLINE  TAKE 1 TABLET BY MOUTH EVERY  MORNING AND TAKE 6 TABLETS BY  MOUTH IN THE EVENING   sertraline  100 MG tablet Commonly known as: ZOLOFT  Take 1 tablet (100 mg total) by mouth daily.   spironolactone 100 MG tablet Commonly known as: ALDACTONE Take 100 mg by mouth 2 (two) times daily.   Symbicort  80-4.5 MCG/ACT inhaler Generic drug: budesonide -formoterol  USE 2 INHALATIONS BY MOUTH TWICE DAILY   tiZANidine 4 MG tablet Commonly known as: ZANAFLEX Take 4 mg by mouth 3 (three) times daily as needed for muscle spasms.   Ventolin  HFA 108 (90 Base) MCG/ACT inhaler Generic drug: albuterol  USE 2 INHALATIONS BY MOUTH EVERY 6 HOURS AS NEEDED FOR WHEEZING  OR SHORTNESS OF BREATH   albuterol  (2.5 MG/3ML) 0.083% nebulizer solution Commonly known as: PROVENTIL  USE 1 VIAL VIA NEBULIZER EVERY 6 HOURS AS NEEDED FOR WHEEZING OR  SHORTNESS OF BREATH   zolpidem  12.5 MG CR tablet Commonly known as: AMBIEN  CR Take 1 tablet (12.5 mg total) by mouth at bedtime as needed for sleep. What changed: when to take this          Follow-up Information     [facility] Follow up.   Specialty: [facility] Contact information: [address] [place] [address] [place] [address] [phone]         Caralyn Chandler, MD. Call.   Specialty: General Surgery Why: We are making a follow up appointment for you., Please call to confirm appointment time., Arrive 15 minutes early to complete check in, and bring photo ID and insurance card. Contact information: [address] [place] [address] [phone]         Sumner Ends, MD.  Go on 04/30/2024.   Specialty: Oncology Why: Dr. Scherrie Curt on 5/5 at 1:40 Contact information: [address] [place] [address] [phone]         [facility], [place]. Call.   Specialty: General Surgery Why: We are making a follow up appointment for you., Please call to confirm appointment time., Arrive 30 minutes early to complete check in, and bring photo ID and insurance card. Contact information: [address] [place]   [address] [phone]                Signed: Shanon Darting [facility] 04/27/2024, 1:50 PM Please see Amion for pager number during day hours 7:00am-4:30pm

## 2024-04-30 ENCOUNTER — Encounter: Payer: Self-pay | Admitting: Nutrition

## 2024-04-30 ENCOUNTER — Encounter: Payer: Self-pay | Admitting: *Deleted

## 2024-04-30 ENCOUNTER — Inpatient Hospital Stay: Admitting: Oncology

## 2024-04-30 VITALS — BP 137/79 | HR 64 | Temp 98.1°F | Resp 18 | Ht 66.5 in | Wt 254.3 lb

## 2024-04-30 DIAGNOSIS — Z803 Family history of malignant neoplasm of breast: Secondary | ICD-10-CM | POA: Insufficient documentation

## 2024-04-30 DIAGNOSIS — R590 Localized enlarged lymph nodes: Secondary | ICD-10-CM | POA: Diagnosis not present

## 2024-04-30 DIAGNOSIS — C182 Malignant neoplasm of ascending colon: Secondary | ICD-10-CM | POA: Insufficient documentation

## 2024-04-30 DIAGNOSIS — R350 Frequency of micturition: Secondary | ICD-10-CM | POA: Insufficient documentation

## 2024-04-30 DIAGNOSIS — C180 Malignant neoplasm of cecum: Secondary | ICD-10-CM | POA: Insufficient documentation

## 2024-04-30 DIAGNOSIS — R918 Other nonspecific abnormal finding of lung field: Secondary | ICD-10-CM | POA: Diagnosis not present

## 2024-04-30 DIAGNOSIS — C189 Malignant neoplasm of colon, unspecified: Secondary | ICD-10-CM

## 2024-04-30 DIAGNOSIS — Z800 Family history of malignant neoplasm of digestive organs: Secondary | ICD-10-CM | POA: Diagnosis not present

## 2024-04-30 NOTE — Progress Notes (Signed)
START ON PATHWAY REGIMEN - Colorectal     A cycle is every 14 days:     Oxaliplatin      Leucovorin      Fluorouracil      Fluorouracil   **Always confirm dose/schedule in your pharmacy ordering system**  Patient Characteristics: Postoperative without Neoadjuvant Therapy, M0 (Pathologic Staging), Colon, Stage III, High Risk (pT4 or pN2) Tumor Location: Colon Therapeutic Status: Postoperative without Neoadjuvant Therapy, M0 (Pathologic Staging) AJCC M Category: cM0 AJCC T Category: pT4a AJCC N Category: pN1a AJCC 8 Stage Grouping: IIIB Intent of Therapy: Curative Intent, Discussed with Patient
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
## 2024-04-30 NOTE — Progress Notes (Signed)
 PATIENT NAVIGATOR PROGRESS NOTE  Name: Erin Bradford Date: 04/30/2024 MRN: [id number]  DOB: [birth year]   Reason for visit:  New Patient appt  Comments:  Met with Erin Bradford and her daughter during new pt appt with Dr Scherrie Curt  Referral made to SW, food insecurity and utility bills Referral made to Nutrition, she is healing from surgery Given Journeys notebook with disease specific information Port education provided and message sent to Dr Alethea Andes for availability for placement. FOLFOX information provided and she will return to clinic on 5/21 for education Given contact number to call with any questions or concerns    Time spent counseling/coordinating care: > 60 minutes

## 2024-05-01 ENCOUNTER — Telehealth: Payer: Self-pay

## 2024-05-01 ENCOUNTER — Other Ambulatory Visit: Payer: Self-pay | Admitting: Neurology

## 2024-05-01 ENCOUNTER — Other Ambulatory Visit: Payer: Self-pay

## 2024-05-01 NOTE — Telephone Encounter (Signed)
 [facility] Clinical Social Work  Clinical Social Work was referred by new patient protocol for assessment of psychosocial needs.  Clinical Social Worker attempted to contact patient by phone to offer support and assess for needs. CSW was unable to reach patient, left vm with direct contact.  CSW will attempt again.    Marguerita Merles, LCSW  Clinical Social Worker [facility]

## 2024-05-02 ENCOUNTER — Inpatient Hospital Stay

## 2024-05-02 NOTE — Progress Notes (Signed)
 [facility] Clinical Social Work  Initial Assessment   Erin Bradford is a 53 y.o. year old female contacted by phone. Clinical Social Work was referred by new patient protocol for assessment of psychosocial needs.   SDOH (Social Determinants of Health) assessments performed: Yes SDOH Interventions    Flowsheet Row Clinical Support from 05/02/2024 in [facility] [place]. [facility] Office Visit from 04/30/2024 in [facility] [place]. [facility]  SDOH Interventions    Food Insecurity Interventions Other (Comment)  [Informed patient of Cancer Center Food Pantry] Other (Comment)  [Provided bag of groceries from food pantry and social worker referral]  Transportation Interventions -- Intervention Not Indicated  Utilities Interventions -- Intervention Not Indicated  Depression Interventions/Treatment  Currently on Treatment  [Patient has current MH provider. Patient is exploring resources through PCP] --  [ON antidepressant and has therapist]  Financial Strain Interventions Other (Comment)  [Referred patient to [employer] (Cancer Center)] --       SDOH Screenings   Food Insecurity: Food Insecurity Present (05/02/2024)  Housing: High Risk (04/30/2024)  Transportation Needs: No Transportation Needs (04/30/2024)  Utilities: Not At Risk (04/30/2024)  Depression (PHQ2-9): Medium Risk (04/30/2024)  Financial Resource Strain: High Risk (04/26/2024)   Received from [facility]  Physical Activity: Unknown (04/26/2024)   Received from [facility]  Social Connections: Patient Declined (04/26/2024)   Received from [facility]  Stress: Stress Concern Present (04/26/2024)   Received from [facility]  Tobacco Use: Low Risk  (04/12/2024)     Distress Screen completed: Yes     No data to display            Family/Social Information:  Housing Arrangement: patient lives with her daughter.   Family members/support persons in your  life? Patient identified her daughter, her mother (lives in [place]), and close family members as her support systems. Transportation concerns: Patient's Aunt will be transporting patient to her infusion appointments. Patient stated transportation services offered through her PCP office will serve as back up.   Employment: Disabled Patient is not working at this time. Patient stated she withdrew from being a student due to circumstances  Income source: Social Security Disability. Patient reported decrease in income. Patient awaiting response for survivor benefits. Financial concerns: Yes, current concerns Type of concern: Utilities, Rent/ mortgage, Phone, Transportation, and [employer] access concerns: yes Religious or spiritual practice: No Advanced directives: No Services Currently in place:  PCP, Family, Insurance, transporation  Coping/ Adjustment to diagnosis: Patient understands treatment plan and what happens next? yes Concerns about diagnosis and/or treatment: Overwhelmed by information Patient reported stressors: Finances, Depression, and Adjusting to my illness Patient enjoys watching TV and time with family/ friends Current coping skills/ strengths: Ability for insight , Active sense of humor , Average or above average intelligence , Communication skills , General fund of knowledge , Motivation for treatment/growth , Special hobby/interest , and Supportive family/friends     SUMMARY: Current SDOH Barriers:  Financial constraints related to housing,utilities, and Limited access to food  Clinical Social Work Clinical Goal(s):  Explore community resource options for unmet needs related to:  Corporate treasurer  and Food Insecurity   Interventions: Discussed common feeling and emotions when being diagnosed with cancer, and the importance of support during treatment Informed patient of the support team roles and support services at [facility] Provided CSW contact information and  encouraged patient to call with any  questions or concerns Provided education and assistance to patient regarding Advance Directives [employer] Available through the [facility] Completed referral for the [employer]   Follow Up Plan: CSW will follow-up with patient by phone  Patient verbalizes understanding of plan: Yes  Maudie Sorrow, LCSW Clinical Social Worker [facility]

## 2024-05-04 ENCOUNTER — Ambulatory Visit: Payer: Self-pay | Admitting: General Surgery

## 2024-05-04 LAB — MOLECULAR PATHOLOGY

## 2024-05-07 ENCOUNTER — Telehealth: Payer: Self-pay | Admitting: *Deleted

## 2024-05-07 ENCOUNTER — Telehealth: Payer: Self-pay | Admitting: Oncology

## 2024-05-07 NOTE — Telephone Encounter (Signed)
 Erin Bradford called to inquire if Dr. Scherrie Curt will be taking over refilling her routine medications. Informed her that Dr. Scherrie Curt will only be refilling those meds related to her cancer diagnosis and symptom management.

## 2024-05-07 NOTE — Telephone Encounter (Signed)
 Pt was referred by Maudie Sorrow, LCSW Clinical Social Worker for possible assistance of the [employer].  I spoke w pt today and based on the information obtain via ph, pt may meet eligibility requirements.  Awaiting income verification at this time.

## 2024-05-09 ENCOUNTER — Other Ambulatory Visit: Payer: Self-pay

## 2024-05-10 ENCOUNTER — Ambulatory Visit (HOSPITAL_COMMUNITY): Admitting: Clinical

## 2024-05-11 ENCOUNTER — Encounter: Payer: Self-pay | Admitting: Oncology

## 2024-05-11 NOTE — Progress Notes (Signed)
 Pharmacist Chemotherapy Monitoring - Initial Assessment    Anticipated start date: 5-28/25   The following has been reviewed per standard work regarding the patient's treatment regimen: The patient's diagnosis, treatment plan and drug doses, and organ/hematologic function Lab orders and baseline tests specific to treatment regimen  The treatment plan start date, drug sequencing, and pre-medications Prior authorization status  Patient's documented medication list, including drug-drug interaction screen and prescriptions for anti-emetics and supportive care specific to the treatment regimen The drug concentrations, fluid compatibility, administration routes, and timing of the medications to be used The patient's access for treatment and lifetime cumulative dose history, if applicable  The patient's medication allergies and previous infusion related reactions, if applicable   Changes made to treatment plan:  N/A  Follow up needed:  N/A   Erin Bradford, RPH, 05/11/2024  9:36 AM

## 2024-05-11 NOTE — Progress Notes (Signed)
 The proposed treatment discussed in conference is for discussion purpose only and is not a binding recommendation.  The patients have not been physically examined, or presented with their treatment options.  Therefore, final treatment plans cannot be decided.

## 2024-05-15 ENCOUNTER — Telehealth: Payer: Self-pay | Admitting: Oncology

## 2024-05-15 ENCOUNTER — Other Ambulatory Visit: Payer: Self-pay

## 2024-05-15 ENCOUNTER — Encounter (HOSPITAL_BASED_OUTPATIENT_CLINIC_OR_DEPARTMENT_OTHER): Payer: Self-pay | Admitting: General Surgery

## 2024-05-15 NOTE — Telephone Encounter (Signed)
 [employer] APPROVED for $1000 FAMILY SIZE:  2 HH INC:  100% Met 250% FPG Annual Met APPROVAL DATE: 05/15/2024

## 2024-05-15 NOTE — Progress Notes (Signed)
 Pharmacist Chemotherapy Monitoring - Initial Assessment    Anticipated start date: 5/ 28/25  The following has been reviewed per standard work regarding the patient's treatment regimen: The patient's diagnosis, treatment plan and drug doses, and organ/hematologic function Lab orders and baseline tests specific to treatment regimen  The treatment plan start date, drug sequencing, and pre-medications Prior authorization status  Patient's documented medication list, including drug-drug interaction screen and prescriptions for anti-emetics and supportive care specific to the treatment regimen The drug concentrations, fluid compatibility, administration routes, and timing of the medications to be used The patient's access for treatment and lifetime cumulative dose history, if applicable  The patient's medication allergies and previous infusion related reactions, if applicable   Changes made to treatment plan:  N/A  Follow up needed:  N/A   [name], RPH, 05/15/2024  2:09 PM

## 2024-05-16 ENCOUNTER — Encounter: Payer: Self-pay | Admitting: Oncology

## 2024-05-16 ENCOUNTER — Inpatient Hospital Stay

## 2024-05-16 ENCOUNTER — Ambulatory Visit: Admitting: Nutrition

## 2024-05-16 ENCOUNTER — Inpatient Hospital Stay (HOSPITAL_BASED_OUTPATIENT_CLINIC_OR_DEPARTMENT_OTHER): Admitting: Nurse Practitioner

## 2024-05-16 ENCOUNTER — Encounter: Payer: Self-pay | Admitting: Nurse Practitioner

## 2024-05-16 VITALS — BP 139/83 | HR 88 | Temp 98.1°F | Resp 18 | Ht 66.5 in | Wt 242.6 lb

## 2024-05-16 DIAGNOSIS — C182 Malignant neoplasm of ascending colon: Secondary | ICD-10-CM | POA: Diagnosis not present

## 2024-05-16 DIAGNOSIS — C189 Malignant neoplasm of colon, unspecified: Secondary | ICD-10-CM

## 2024-05-16 DIAGNOSIS — T8143XA Infection following a procedure, organ and space surgical site, initial encounter: Secondary | ICD-10-CM | POA: Diagnosis not present

## 2024-05-16 DIAGNOSIS — L0291 Cutaneous abscess, unspecified: Secondary | ICD-10-CM | POA: Diagnosis not present

## 2024-05-16 LAB — CBC WITH DIFFERENTIAL (CANCER CENTER ONLY)
Abs Immature Granulocytes: 0.11 10*3/uL — ABNORMAL HIGH (ref 0.00–0.07)
Basophils Absolute: 0.1 10*3/uL (ref 0.0–0.1)
Basophils Relative: 0 %
Eosinophils Absolute: 0.3 10*3/uL (ref 0.0–0.5)
Eosinophils Relative: 2 %
HCT: 35 % — ABNORMAL LOW (ref 36.0–46.0)
Hemoglobin: 11.2 g/dL — ABNORMAL LOW (ref 12.0–15.0)
Immature Granulocytes: 1 %
Lymphocytes Relative: 15 %
Lymphs Abs: 2.1 10*3/uL (ref 0.7–4.0)
MCH: 28.1 pg (ref 26.0–34.0)
MCHC: 32 g/dL (ref 30.0–36.0)
MCV: 87.9 fL (ref 80.0–100.0)
Monocytes Absolute: 1.8 10*3/uL — ABNORMAL HIGH (ref 0.1–1.0)
Monocytes Relative: 13 %
Neutro Abs: 9.8 10*3/uL — ABNORMAL HIGH (ref 1.7–7.7)
Neutrophils Relative %: 69 %
Platelet Count: 625 10*3/uL — ABNORMAL HIGH (ref 150–400)
RBC: 3.98 MIL/uL (ref 3.87–5.11)
RDW: 14.4 % (ref 11.5–15.5)
WBC Count: 14.2 10*3/uL — ABNORMAL HIGH (ref 4.0–10.5)
nRBC: 0 % (ref 0.0–0.2)

## 2024-05-16 LAB — CMP (CANCER CENTER ONLY)
ALT: 62 U/L — ABNORMAL HIGH (ref 0–44)
AST: 45 U/L — ABNORMAL HIGH (ref 15–41)
Albumin: 4.1 g/dL (ref 3.5–5.0)
Alkaline Phosphatase: 247 U/L — ABNORMAL HIGH (ref 38–126)
Anion gap: 15 (ref 5–15)
BUN: 7 mg/dL (ref 6–20)
CO2: 26 mmol/L (ref 22–32)
Calcium: 10.4 mg/dL — ABNORMAL HIGH (ref 8.9–10.3)
Chloride: 96 mmol/L — ABNORMAL LOW (ref 98–111)
Creatinine: 0.97 mg/dL (ref 0.44–1.00)
GFR, Estimated: >60 mL/min (ref >60)
Glucose, Bld: 152 mg/dL — ABNORMAL HIGH (ref 70–99)
Potassium: 4.5 mmol/L (ref 3.5–5.1)
Sodium: 137 mmol/L (ref 135–145)
Total Bilirubin: 0.3 mg/dL (ref 0.0–1.2)
Total Protein: 8.2 g/dL — ABNORMAL HIGH (ref 6.5–8.1)

## 2024-05-16 LAB — CEA (ACCESS): CEA (CHCC): 10.09 ng/mL — ABNORMAL HIGH (ref 0.00–5.00)

## 2024-05-16 NOTE — Progress Notes (Signed)
   Cancer Center OFFICE PROGRESS NOTE   Diagnosis: Colon cancer  INTERVAL HISTORY:   Erin Bradford returns as scheduled.  Overall she is feeling better.  The wound is healing.  Bowels are moving.  No nausea or vomiting.  Good fluid intake.  Objective:  Vital signs in last 24 hours:  Blood pressure 139/83, pulse 88, temperature 98.1 F (36.7 C), temperature source Temporal, resp. rate 18, height 5' 6.5" (1.689 m), weight 242 lb 9.6 oz (110 kg), last menstrual period 09/17/2017, SpO2 98%.    HEENT: Mild white coating over tongue.  No buccal thrush.   Resp: Lungs clear bilaterally. Cardio: Regular rate and rhythm. GI: Wound VAC in place.  Nontender.  No hepatomegaly. Vascular: No leg edema.   Lab Results:  Lab Results  Component Value Date   WBC 14.2 (H) 05/16/2024   HGB 11.2 (L) 05/16/2024   HCT 35.0 (L) 05/16/2024   MCV 87.9 05/16/2024   PLT 625 (H) 05/16/2024   NEUTROABS 9.8 (H) 05/16/2024    Imaging:  No results found.  Medications: I have reviewed the patient's current medications.  Assessment/Plan: Colon cancer, cecum, stage IIIb (pT4a, PN1a), Ileocecectomy 04/12/2024-moderately differentiated adenocarcinoma of the cecum, no macroscopic tumor perforation, carcinoma invades into the serosal surface, no lymphovascular perineural invasion, negative resection margins, 1/3 nodes, 1 tumor deposit, normal mismatch repair protein expression, microsatellite stable CT abdomen/pelvis 04/11/2024: Dilated appendix with extensive periappendiceal inflammatory changes and a 5 cm fluid collection, no enlarged abdominal pelvic lymph nodes, unchanged 9 mm right adrenal nodule from 2018 CT chest 04/21/2024: 1.4 cm anterior mediastinal nodule or node, bilateral tiny nonspecific pulmonary nodules Depression Urinary frequency Hypertension Tremors Tachycardia Asthma Family history of breast and colon cancer Colonoscopy-needs colonoscopy approximately 6 months from  diagnosis  Disposition: Erin Bradford appears stable.  She continues to recover from the recent surgery.  She feels the wound is healing.  We did not evaluate the wound today due to the wound VAC being in place.  She is scheduled for Port-A-Cath placement by Dr. Alethea Andes 05/18/2024.  She will ask him to evaluate the wound with regard to beginning chemotherapy.  She is scheduled for cycle 1 FOLFOX 05/23/2024.  We again reviewed the potential side effects associated with FOLFOX.  She has attended a chemotherapy education class.  She agrees to proceed with chemotherapy as above.  Her case was presented at the GI tumor conference.  No further surgery is recommended.  PET scan recommended to evaluate the anterior mediastinal nodule.  Order placed today.  She will return for follow-up and cycle 1 FOLFOX on 05/23/2024.  We are available to see her sooner if needed.  Patient seen with Dr. Scherrie Curt.    Erin Bradford ANP/GNP-BC   05/16/2024  2:14 PM   This was a shared visit with Erin Bradford.  Erin Bradford has been diagnosed with stage III colon cancer.  Her case was presented at the GI tumor conference on 05/09/2024.  Review of the CT images reveals nonspecific pulmonary nodules and an indeterminate mediastinal nodule.  A staging PET is recommended.  She agrees to proceed with a PET scan.  The surgeons do not recommend a right colectomy.  The abdominal wound appears to be healing.  She is scheduled for Port-A-Cath placement later this week.  The plan is to begin adjuvant FOLFOX 05/23/2024.  I was present for greater than 50% of today's visit.  I performed Medical Decision Making.  Anise Kerns, MD

## 2024-05-16 NOTE — Progress Notes (Signed)
 PATIENT NAVIGATOR PROGRESS NOTE  Name: Erin Bradford Date: 05/16/2024 MRN: [id number]  DOB: 25-Jun-1971   Reason for visit:  Patient education session and PET scan schedule  Comments:  Pt and Aunt here for education for FOLFOX starting next week.  PET scan scheduled for May 30th at 4:!5 at [facility]. Pt given written instructions    Time spent counseling/coordinating care: > 60 minutes

## 2024-05-16 NOTE — Progress Notes (Signed)
 53 year old female diagnosed with colon cancer and followed by Dr. Scherrie Curt. Plan FOLFOX q14 d x 6 months/Feraheme  q 7 days  Erin Bradford presented to the emergency room 04/11/2024 with nausea/vomiting, abdominal pain, and flank pain.  She was also constipated.  A CT Abdo/pelvis revealed a dilated appendix with periappendiceal inflammatory changes and a fluid collection consistent with an abscess.   She was diagnosed with perforated appendicitis. She was taken to an ileocecectomy by Dr. Alethea Andes on 04/12/2024.  The appendix appeared inflamed with a loop of small bowel adherent abscess cavity.  The appendix could not be removed laparoscopically.  An open ileocecectomy was performed with anastomosis between the small bowel and cecum was created.  A drain was placed into the pelvis. She remained hospitalized until 04/24/2024. A wound VAC was placed prior to discharge. The wound VAC remains in place.   Reports she doesn't eat breakfast or lunch but will snack on watermelon. She eats a "Mom's Meal" for dinner. She has purchased Ensure Complete and prefers Strawberry or Chocolate. She endorses weight loss was unintentional. Denies current nausea, vomiting, constipation or diarrhea.  PMH includes Acid Reflux, CKD, Depression, GERD, HTN, Pre-diabetes  Medications include Wellbutrin , Pepcid , Lasix , Zoloft   Labs include Glucose 152  Height: 66.5 inches Weight: 242 pounds 9.6 oz. Weighed 254 pounds 3.1 oz on May 20. 5% weight loss in one week/significant. UBW: ~265 pounds in 2024. BMI: 38.57  Nutrition Diagnosis: Increased nutrient needs related to cancer and associated treatments as evidenced by slow healing wound with VAC and weight loss.  Intervention: Educated on increased needs for wound healing. Educated to increase calories and protein in small amounts throughout the day. Reviewed high protein snack ideas.  Increase Ensure Complete to 1 carton 2-3 times daily. Provided samples and coupons. Educated  on strategies for eating with nausea and vomiting. Encouraged bowel regimen. Questions answered. Fact sheets provided. Contact information given.  Monitoring, Evaluation, Goals: Tolerate increased calories and protein for healing and weight maintenance.  Next Visit: To be scheduled as needed.

## 2024-05-17 ENCOUNTER — Encounter: Payer: Self-pay | Admitting: Oncology

## 2024-05-18 ENCOUNTER — Other Ambulatory Visit (HOSPITAL_COMMUNITY)

## 2024-05-18 ENCOUNTER — Inpatient Hospital Stay

## 2024-05-18 ENCOUNTER — Inpatient Hospital Stay (HOSPITAL_BASED_OUTPATIENT_CLINIC_OR_DEPARTMENT_OTHER)
Admission: EM | Admit: 2024-05-18 | Discharge: 2024-05-21 | DRG: 863 | Disposition: A | Discharge status: Home Health | Attending: Surgery | Admitting: Surgery

## 2024-05-18 ENCOUNTER — Encounter (HOSPITAL_BASED_OUTPATIENT_CLINIC_OR_DEPARTMENT_OTHER): Payer: Self-pay | Admitting: General Surgery

## 2024-05-18 ENCOUNTER — Encounter (HOSPITAL_COMMUNITY): Payer: Self-pay

## 2024-05-18 ENCOUNTER — Encounter (HOSPITAL_BASED_OUTPATIENT_CLINIC_OR_DEPARTMENT_OTHER)
Admission: RE | Disposition: A | Discharge status: Home / Self Care | Payer: Self-pay | Source: Home / Self Care | Attending: General Surgery

## 2024-05-18 ENCOUNTER — Ambulatory Visit (HOSPITAL_BASED_OUTPATIENT_CLINIC_OR_DEPARTMENT_OTHER)
Admission: RE | Admit: 2024-05-18 | Discharge: 2024-05-18 | Disposition: A | Discharge status: Home / Self Care | Source: Home / Self Care | Attending: General Surgery | Admitting: General Surgery

## 2024-05-18 ENCOUNTER — Ambulatory Visit (HOSPITAL_BASED_OUTPATIENT_CLINIC_OR_DEPARTMENT_OTHER): Admitting: Anesthesiology

## 2024-05-18 ENCOUNTER — Other Ambulatory Visit: Payer: Self-pay

## 2024-05-18 ENCOUNTER — Ambulatory Visit (HOSPITAL_COMMUNITY)
Admission: RE | Admit: 2024-05-18 | Discharge: 2024-05-18 | Disposition: A | Discharge status: Home / Self Care | Source: Home / Self Care | Attending: General Surgery | Admitting: General Surgery

## 2024-05-18 ENCOUNTER — Encounter (HOSPITAL_BASED_OUTPATIENT_CLINIC_OR_DEPARTMENT_OTHER): Payer: Self-pay | Admitting: Emergency Medicine

## 2024-05-18 DIAGNOSIS — Z5941 Food insecurity: Secondary | ICD-10-CM

## 2024-05-18 DIAGNOSIS — C182 Malignant neoplasm of ascending colon: Secondary | ICD-10-CM | POA: Insufficient documentation

## 2024-05-18 DIAGNOSIS — F32A Depression, unspecified: Secondary | ICD-10-CM | POA: Diagnosis present

## 2024-05-18 DIAGNOSIS — Z7951 Long term (current) use of inhaled steroids: Secondary | ICD-10-CM | POA: Insufficient documentation

## 2024-05-18 DIAGNOSIS — L0291 Cutaneous abscess, unspecified: Secondary | ICD-10-CM | POA: Diagnosis present

## 2024-05-18 DIAGNOSIS — I10 Essential (primary) hypertension: Secondary | ICD-10-CM | POA: Diagnosis present

## 2024-05-18 DIAGNOSIS — Z5948 Other specified lack of adequate food: Secondary | ICD-10-CM

## 2024-05-18 DIAGNOSIS — Z9049 Acquired absence of other specified parts of digestive tract: Secondary | ICD-10-CM | POA: Insufficient documentation

## 2024-05-18 DIAGNOSIS — Z8249 Family history of ischemic heart disease and other diseases of the circulatory system: Secondary | ICD-10-CM | POA: Diagnosis not present

## 2024-05-18 DIAGNOSIS — Z803 Family history of malignant neoplasm of breast: Secondary | ICD-10-CM

## 2024-05-18 DIAGNOSIS — Z59811 Housing instability, housed, with risk of homelessness: Secondary | ICD-10-CM

## 2024-05-18 DIAGNOSIS — Z83438 Family history of other disorder of lipoprotein metabolism and other lipidemia: Secondary | ICD-10-CM

## 2024-05-18 DIAGNOSIS — R7303 Prediabetes: Secondary | ICD-10-CM | POA: Insufficient documentation

## 2024-05-18 DIAGNOSIS — Z841 Family history of disorders of kidney and ureter: Secondary | ICD-10-CM | POA: Diagnosis not present

## 2024-05-18 DIAGNOSIS — Z79899 Other long term (current) drug therapy: Secondary | ICD-10-CM

## 2024-05-18 DIAGNOSIS — T8143XA Infection following a procedure, organ and space surgical site, initial encounter: Principal | ICD-10-CM | POA: Diagnosis present

## 2024-05-18 DIAGNOSIS — D72829 Elevated white blood cell count, unspecified: Secondary | ICD-10-CM | POA: Diagnosis present

## 2024-05-18 DIAGNOSIS — M170 Bilateral primary osteoarthritis of knee: Secondary | ICD-10-CM | POA: Diagnosis present

## 2024-05-18 DIAGNOSIS — Y838 Other surgical procedures as the cause of abnormal reaction of the patient, or of later complication, without mention of misadventure at the time of the procedure: Secondary | ICD-10-CM | POA: Diagnosis present

## 2024-05-18 DIAGNOSIS — R350 Frequency of micturition: Secondary | ICD-10-CM | POA: Diagnosis present

## 2024-05-18 DIAGNOSIS — Z833 Family history of diabetes mellitus: Secondary | ICD-10-CM | POA: Insufficient documentation

## 2024-05-18 DIAGNOSIS — I129 Hypertensive chronic kidney disease with stage 1 through stage 4 chronic kidney disease, or unspecified chronic kidney disease: Secondary | ICD-10-CM | POA: Insufficient documentation

## 2024-05-18 DIAGNOSIS — Z808 Family history of malignant neoplasm of other organs or systems: Secondary | ICD-10-CM

## 2024-05-18 DIAGNOSIS — Z6839 Body mass index (BMI) 39.0-39.9, adult: Secondary | ICD-10-CM | POA: Diagnosis not present

## 2024-05-18 DIAGNOSIS — J45909 Unspecified asthma, uncomplicated: Secondary | ICD-10-CM | POA: Insufficient documentation

## 2024-05-18 DIAGNOSIS — C180 Malignant neoplasm of cecum: Secondary | ICD-10-CM | POA: Diagnosis present

## 2024-05-18 DIAGNOSIS — F419 Anxiety disorder, unspecified: Secondary | ICD-10-CM | POA: Diagnosis present

## 2024-05-18 DIAGNOSIS — Z6838 Body mass index (BMI) 38.0-38.9, adult: Secondary | ICD-10-CM | POA: Insufficient documentation

## 2024-05-18 DIAGNOSIS — Z800 Family history of malignant neoplasm of digestive organs: Secondary | ICD-10-CM | POA: Diagnosis not present

## 2024-05-18 DIAGNOSIS — E669 Obesity, unspecified: Secondary | ICD-10-CM | POA: Diagnosis present

## 2024-05-18 DIAGNOSIS — L02211 Cutaneous abscess of abdominal wall: Secondary | ICD-10-CM | POA: Diagnosis present

## 2024-05-18 DIAGNOSIS — Z5986 Financial insecurity: Secondary | ICD-10-CM | POA: Diagnosis not present

## 2024-05-18 DIAGNOSIS — Z5309 Procedure and treatment not carried out because of other contraindication: Secondary | ICD-10-CM | POA: Diagnosis present

## 2024-05-18 DIAGNOSIS — R1032 Left lower quadrant pain: Secondary | ICD-10-CM

## 2024-05-18 DIAGNOSIS — Z8269 Family history of other diseases of the musculoskeletal system and connective tissue: Secondary | ICD-10-CM

## 2024-05-18 HISTORY — DX: Prediabetes: R73.03

## 2024-05-18 HISTORY — DX: Unspecified asthma, uncomplicated: J45.909

## 2024-05-18 LAB — COMPREHENSIVE METABOLIC PANEL WITH GFR
ALT: 53 U/L — ABNORMAL HIGH (ref 0–44)
AST: 45 U/L — ABNORMAL HIGH (ref 15–41)
Albumin: 3.7 g/dL (ref 3.5–5.0)
Alkaline Phosphatase: 225 U/L — ABNORMAL HIGH (ref 38–126)
Anion gap: 15 (ref 5–15)
BUN: 13 mg/dL (ref 6–20)
CO2: 24 mmol/L (ref 22–32)
Calcium: 9.7 mg/dL (ref 8.9–10.3)
Chloride: 96 mmol/L — ABNORMAL LOW (ref 98–111)
Creatinine, Ser: 0.95 mg/dL (ref 0.44–1.00)
GFR, Estimated: >60 mL/min (ref >60)
Glucose, Bld: 169 mg/dL — ABNORMAL HIGH (ref 70–99)
Potassium: 3.8 mmol/L (ref 3.5–5.1)
Sodium: 135 mmol/L (ref 135–145)
Total Bilirubin: 0.2 mg/dL (ref 0.0–1.2)
Total Protein: 7.1 g/dL (ref 6.5–8.1)

## 2024-05-18 LAB — CBC WITH DIFFERENTIAL/PLATELET
Abs Immature Granulocytes: 0.33 10*3/uL — ABNORMAL HIGH (ref 0.00–0.07)
Basophils Absolute: 0.1 10*3/uL (ref 0.0–0.1)
Basophils Relative: 0 %
Eosinophils Absolute: 0.2 10*3/uL (ref 0.0–0.5)
Eosinophils Relative: 1 %
HCT: 31 % — ABNORMAL LOW (ref 36.0–46.0)
Hemoglobin: 10 g/dL — ABNORMAL LOW (ref 12.0–15.0)
Immature Granulocytes: 1 %
Lymphocytes Relative: 12 %
Lymphs Abs: 2.8 10*3/uL (ref 0.7–4.0)
MCH: 28.2 pg (ref 26.0–34.0)
MCHC: 32.3 g/dL (ref 30.0–36.0)
MCV: 87.3 fL (ref 80.0–100.0)
Monocytes Absolute: 2.5 10*3/uL — ABNORMAL HIGH (ref 0.1–1.0)
Monocytes Relative: 11 %
Neutro Abs: 17.5 10*3/uL — ABNORMAL HIGH (ref 1.7–7.7)
Neutrophils Relative %: 75 %
Platelets: 651 10*3/uL — ABNORMAL HIGH (ref 150–400)
RBC: 3.55 MIL/uL — ABNORMAL LOW (ref 3.87–5.11)
RDW: 14.7 % (ref 11.5–15.5)
WBC: 23.3 10*3/uL — ABNORMAL HIGH (ref 4.0–10.5)
nRBC: 0 % (ref 0.0–0.2)

## 2024-05-18 LAB — LACTIC ACID, PLASMA
Lactic Acid, Venous: 1.2 mmol/L (ref 0.5–1.9)
Lactic Acid, Venous: 1.5 mmol/L (ref 0.5–1.9)

## 2024-05-18 LAB — GLUCOSE, CAPILLARY: Glucose-Capillary: 184 mg/dL — ABNORMAL HIGH (ref 70–99)

## 2024-05-18 SURGERY — INSERTION, TUNNELED CENTRAL VENOUS DEVICE, WITH PORT
Anesthesia: General | Laterality: Right

## 2024-05-18 MED ORDER — ACETAMINOPHEN 500 MG PO TABS
1000.0000 mg | ORAL_TABLET | Freq: Four times a day (QID) | ORAL | Status: DC
  Administered 2024-05-18 – 2024-05-21 (×11): 1000 mg via ORAL
  Filled 2024-05-18 (×12): qty 2

## 2024-05-18 MED ORDER — TRAMADOL HCL 50 MG PO TABS
50.0000 mg | ORAL_TABLET | ORAL | Status: DC | PRN
  Administered 2024-05-18 – 2024-05-20 (×7): 50 mg via ORAL
  Filled 2024-05-18 (×7): qty 1

## 2024-05-18 MED ORDER — ONDANSETRON HCL 4 MG/2ML IJ SOLN: 4.0000 mg | Freq: Four times a day (QID) | INTRAMUSCULAR | Status: DC | PRN

## 2024-05-18 MED ORDER — VANCOMYCIN HCL IN DEXTROSE 1-5 GM/200ML-% IV SOLN
1000.0000 mg | Freq: Once | INTRAVENOUS | Status: AC
  Administered 2024-05-18: 1000 mg via INTRAVENOUS
  Filled 2024-05-18: qty 200

## 2024-05-18 MED ORDER — MORPHINE SULFATE (PF) 4 MG/ML IV SOLN
4.0000 mg | Freq: Once | INTRAVENOUS | Status: AC
  Administered 2024-05-18: 4 mg via INTRAVENOUS
  Filled 2024-05-18: qty 1

## 2024-05-18 MED ORDER — LACTATED RINGERS IV BOLUS (SEPSIS)
500.0000 mL | Freq: Once | INTRAVENOUS | Status: AC
  Administered 2024-05-18: 500 mL via INTRAVENOUS

## 2024-05-18 MED ORDER — LACTATED RINGERS IV SOLN: INTRAVENOUS | Status: AC

## 2024-05-18 MED ORDER — ONDANSETRON HCL 4 MG/2ML IJ SOLN
4.0000 mg | Freq: Once | INTRAMUSCULAR | Status: AC
  Administered 2024-05-18: 4 mg via INTRAVENOUS
  Filled 2024-05-18: qty 2

## 2024-05-18 MED ORDER — CHLORHEXIDINE GLUCONATE CLOTH 2 % EX PADS: 6.0000 | MEDICATED_PAD | Freq: Once | CUTANEOUS | Status: DC

## 2024-05-18 MED ORDER — PIPERACILLIN-TAZOBACTAM 3.375 G IVPB
3.3750 g | Freq: Three times a day (TID) | INTRAVENOUS | Status: DC
  Administered 2024-05-19 – 2024-05-21 (×7): 3.375 g via INTRAVENOUS
  Filled 2024-05-18 (×8): qty 50

## 2024-05-18 MED ORDER — ENOXAPARIN SODIUM 40 MG/0.4ML IJ SOSY
40.0000 mg | PREFILLED_SYRINGE | INTRAMUSCULAR | Status: DC
Start: 2024-05-18 — End: 2024-05-21
  Administered 2024-05-18 – 2024-05-20 (×3): 40 mg via SUBCUTANEOUS
  Filled 2024-05-18 (×3): qty 0.4

## 2024-05-18 MED ORDER — CEFAZOLIN SODIUM-DEXTROSE 2-4 GM/100ML-% IV SOLN: 2.0000 g | INTRAVENOUS | Status: DC

## 2024-05-18 MED ORDER — LACTATED RINGERS IV SOLN: INTRAVENOUS | Status: DC

## 2024-05-18 MED ORDER — PIPERACILLIN-TAZOBACTAM 3.375 G IVPB 30 MIN
3.3750 g | Freq: Once | INTRAVENOUS | Status: AC
  Administered 2024-05-18: 3.375 g via INTRAVENOUS
  Filled 2024-05-18: qty 50

## 2024-05-18 MED ORDER — DOCUSATE SODIUM 100 MG PO CAPS
100.0000 mg | ORAL_CAPSULE | Freq: Two times a day (BID) | ORAL | Status: DC
  Administered 2024-05-18 – 2024-05-19 (×2): 100 mg via ORAL
  Filled 2024-05-18 (×4): qty 1

## 2024-05-18 MED ORDER — ONDANSETRON 4 MG PO TBDP: 4.0000 mg | ORAL_TABLET | Freq: Four times a day (QID) | ORAL | Status: DC | PRN

## 2024-05-18 MED ORDER — METHOCARBAMOL 500 MG PO TABS
1000.0000 mg | ORAL_TABLET | Freq: Three times a day (TID) | ORAL | Status: DC
  Administered 2024-05-18 – 2024-05-21 (×8): 1000 mg via ORAL
  Filled 2024-05-18 (×8): qty 2

## 2024-05-18 MED ORDER — SIMETHICONE 40 MG/0.6ML PO SUSP: 40.0000 mg | Freq: Four times a day (QID) | ORAL | Status: DC | PRN

## 2024-05-18 MED ORDER — VANCOMYCIN HCL IN DEXTROSE 1-5 GM/200ML-% IV SOLN
1000.0000 mg | Freq: Once | INTRAVENOUS | Status: AC
  Administered 2024-05-18: 1000 mg via INTRAVENOUS

## 2024-05-18 NOTE — ED Notes (Signed)
 1 set of blood cultures drawn before starting antibiotics. Unable to get 2nd set at this time.

## 2024-05-18 NOTE — ED Notes (Addendum)
 PIV infiltration of left forearm immediately after starting Vancomycin. IV infusion stopped and removed. Swelling noted at sight. Ice applied and pharmacy notified.

## 2024-05-18 NOTE — ED Triage Notes (Signed)
 Pt arrived today with c/o abdominal pain and drainage from JP drain that was removed a few weeks ago. Was seen at [facility] today port placement but was sent to CT for hardening at JP drain site and sent home. Here for further evaluation.

## 2024-05-18 NOTE — ED Provider Notes (Signed)
 [facility] Provider Note   CSN: [id number] Arrival date & time: 05/18/24  1718     History  Chief Complaint  Patient presents with   Abdominal Pain    Erin [facility] is a 53 y.o. female.  Pt is a 53 year old female who is about 3 weeks status post ileocecectomy for what turned out to be a stage IIIb right colon cancer who had her left lower quadrant drain removed about 2 weeks ago who is presenting to [facility] today to have a Port-A-Cath placed for chemotherapy but upon arrival there was discussing for the last 1 week she has had worsening pain and swelling at the left lower quadrant drain site.  In the last 2 to 3 days she has had cold sweats and today woke up feeling generally unwell with vomiting, cold sweats and severe pain in her left lower quadrant.  She has had the wound VAC in place and reports that has been feeling okay.  She has had loose bowel movements and urine has been good.  She was sent for a CAT scan by Dr. Alethea Andes after evaluation and canceling of her Port-A-Cath.  They called her after the scan had been done and told her to return to the [facility].  She wrote reports that shortly after leaving the [facility] she started having drainage from the drain site and it was foul-smelling and purulent.  The history is provided by the patient.  Abdominal Pain      Home Medications Prior to Admission medications   Medication Sig Start Date End Date Taking? Authorizing Provider  acetaminophen  (TYLENOL ) 500 MG tablet Take 2 tablets (1,000 mg total) by mouth every 6 (six) hours. 04/24/24   Elwin Hammond, PA-C  albuterol  (PROVENTIL ) (2.5 MG/3ML) 0.083% nebulizer solution USE 1 VIAL VIA NEBULIZER EVERY 6 HOURS AS NEEDED FOR WHEEZING OR  SHORTNESS OF BREATH 08/08/23   Paseda, Folashade R, FNP  buPROPion  (WELLBUTRIN  XL) 300 MG 24 hr tablet Take 1 tablet (300 mg total) by mouth daily. 03/05/24   Arfeen, Bronson Canny, MD  carvedilol  (COREG ) 25 MG  tablet TAKE 1 TABLET BY MOUTH TWICE  DAILY 12/06/23   Camnitz, Babetta Lesch, MD  cetirizine  (ZYRTEC ) 10 MG tablet TAKE 1 TABLET(10 MG) BY MOUTH AT BEDTIME AS NEEDED FOR ALLERGIES Patient taking differently: Take 10 mg by mouth at bedtime. TAKE 1 TABLET(10 MG) BY MOUTH AT BEDTIME AS NEEDED FOR ALLERGIES 03/23/23   Paseda, Folashade R, FNP  famotidine  (PEPCID ) 20 MG tablet TAKE 1 TABLET(20 MG) BY MOUTH TWICE DAILY 05/15/20   Stroud, Natalie M, FNP  fluticasone  (FLONASE ) 50 MCG/ACT nasal spray SHAKE LIQUID AND USE 2 SPRAYS IN EACH NOSTRIL DAILY Patient taking differently: Place 2 sprays into both nostrils daily as needed for allergies or rhinitis. 03/23/23   Paseda, Folashade R, FNP  furosemide  (LASIX ) 20 MG tablet Take 20 mg by mouth 2 (two) times daily.     [provider]  gabapentin  (NEURONTIN ) 300 MG capsule Take 2 capsules by mouth 3 (three) times daily. 06/15/21   Doug Gehrig, MD  oxybutynin  (DITROPAN -XL) 10 MG 24 hr tablet TAKE 1 TABLET BY MOUTH AT  BEDTIME 04/04/24   Nichols, Tonya S, NP  oxyCODONE -acetaminophen  (PERCOCET) 10-325 MG tablet Take 1 tablet by mouth 3 (three) times daily as needed for pain.    [provider]  primidone  (MYSOLINE ) 50 MG tablet TAKE 1 TABLET BY MOUTH EVERY MORNING AND 6 TABLETS EVERY EVENING 05/02/24  Festus Hubert, Adam R, DO  sertraline  (ZOLOFT ) 100 MG tablet Take 1 tablet (100 mg total) by mouth daily. 03/05/24 06/03/24  Arfeen, Bronson Canny, MD  spironolactone (ALDACTONE) 100 MG tablet Take 100 mg by mouth 2 (two) times daily. 05/08/21   Doug Gehrig, MD  SYMBICORT  80-4.5 MCG/ACT inhaler USE 2 INHALATIONS BY MOUTH TWICE DAILY 02/29/24   Paseda, Folashade R, FNP  tiZANidine (ZANAFLEX) 4 MG tablet Take 4 mg by mouth 3 (three) times daily as needed for muscle spasms.    [provider]  VENTOLIN  HFA 108 (90 Base) MCG/ACT inhaler USE 2 INHALATIONS BY MOUTH EVERY 6 HOURS AS NEEDED FOR WHEEZING  OR SHORTNESS OF BREATH 08/08/23   Paseda, Folashade R, FNP  zolpidem   (AMBIEN  CR) 12.5 MG CR tablet Take 1 tablet (12.5 mg total) by mouth at bedtime as needed for sleep. Patient taking differently: Take 12.5 mg by mouth at bedtime. 03/05/24   Arfeen, Bronson Canny, MD      Allergies    Ace inhibitors    Review of Systems   Review of Systems  Gastrointestinal:  Positive for abdominal pain.    Physical Exam Updated Vital Signs BP 107/61   Pulse 73   Temp 98.1 F (36.7 C)   Resp 18   Ht 5\' 6"  (1.676 m)   Wt 109.8 kg   LMP 09/17/2017 (Approximate)   SpO2 94%   BMI 39.06 kg/m  Physical Exam Vitals and nursing note reviewed.  Constitutional:      General: She is not in acute distress.    Appearance: She is well-developed.  HENT:     Head: Normocephalic and atraumatic.  Eyes:     Pupils: Pupils are equal, round, and reactive to light.  Cardiovascular:     Rate and Rhythm: Normal rate and regular rhythm.     Heart sounds: Normal heart sounds. No murmur heard.    No friction rub.  Pulmonary:     Effort: Pulmonary effort is normal.     Breath sounds: Normal breath sounds. No wheezing or rales.  Abdominal:     General: Bowel sounds are normal. There is no distension.     Palpations: Abdomen is soft.     Tenderness: There is no abdominal tenderness. There is no guarding or rebound.     Comments: Wound VAC present in the center of the abdomen with no significant pain or erythema.  Suction seal appears to be functioning appropriately.  In the left lower quadrant there is now an open draining wound with purulent smelly pus with surrounding erythema and tenderness with palpation with some induration  Musculoskeletal:        General: No tenderness. Normal range of motion.     Comments: No edema  Skin:    General: Skin is warm and dry.     Findings: No rash.  Neurological:     Mental Status: She is alert and oriented to person, place, and time.     Cranial Nerves: No cranial nerve deficit.  Psychiatric:        Behavior: Behavior normal.     ED  Results / Procedures / Treatments   Labs (all labs ordered are listed, but only abnormal results are displayed) Labs Reviewed  CBC WITH DIFFERENTIAL/PLATELET - Abnormal; Notable for the following components:      Result Value   WBC 23.3 (*)    RBC 3.55 (*)    Hemoglobin 10.0 (*)    HCT 31.0 (*)  Platelets 651 (*)    Neutro Abs 17.5 (*)    Monocytes Absolute 2.5 (*)    Abs Immature Granulocytes 0.33 (*)    All other components within normal limits  COMPREHENSIVE METABOLIC PANEL WITH GFR - Abnormal; Notable for the following components:   Chloride 96 (*)    Glucose, Bld 169 (*)    AST 45 (*)    ALT 53 (*)    Alkaline Phosphatase 225 (*)    All other components within normal limits  CULTURE, BLOOD (ROUTINE X 2)  CULTURE, BLOOD (ROUTINE X 2)  LACTIC ACID, PLASMA  LACTIC ACID, PLASMA  PROTIME-INR  HIV ANTIBODY (ROUTINE TESTING W REFLEX)  BASIC METABOLIC PANEL WITH GFR  CBC    EKG None  Radiology CT ABDOMEN PELVIS WO CONTRAST Result Date: 05/18/2024 CLINICAL DATA:  New diagnosis of cecal adenocarcinoma with left lower quadrant abdominal pain. Status post ileocecectomy complicated by fluid collections. * Tracking Code: BO * EXAM: CT ABDOMEN AND PELVIS WITHOUT CONTRAST TECHNIQUE: Multidetector CT imaging of the abdomen and pelvis was performed following the standard protocol without IV contrast. RADIATION DOSE REDUCTION: This exam was performed according to the departmental dose-optimization program which includes automated exposure control, adjustment of the mA and/or kV according to patient size and/or use of iterative reconstruction technique. COMPARISON:  CT abdomen and pelvis dated 04/18/2024 and multiple priors, CT chest dated 04/21/2024 FINDINGS: Lower chest: 3 mm peripheral left lower lobe nodule (4:10), unchanged compared to 04/11/2024. No pleural effusion or pneumothorax demonstrated. Partially imaged heart size is normal. Hepatobiliary: No focal hepatic lesions. No intra or  extrahepatic biliary ductal dilation. Normal gallbladder. Pancreas: No focal lesions or main ductal dilation. Spleen: Normal in size without focal abnormality. Adrenals/Urinary Tract: Unchanged 13 mm right adrenal nodule (2:18) measuring 10 HU. Inferior medial to the right adrenal gland is a separate 9 mm nodule (2:22), unchanged. No left adrenal nodule. No suspicious renal mass on this noncontrast enhanced examination , calculi or hydronephrosis. Underdistended urinary bladder. Stomach/Bowel: Normal appearance of the stomach. Postsurgical changes of ileocecectomy. No evidence of bowel wall thickening, distention, or inflammatory changes. Appendectomy. Vascular/Lymphatic: No significant vascular findings are present. No enlarged abdominal or pelvic lymph nodes. Reproductive: No adnexal masses. Postsurgical change and mesenteric stranding is inseparable from the right ovary. Other: Interval removal of left anterior lower quadrant approach drainage catheter. There is increased density along the catheter tract. No free air. No interval reaccumulation of previously noted fluid collections. Trace pelvic free fluid and mesenteric stranding. Musculoskeletal: No acute or abnormal lytic or blastic osseous lesions. Multilevel degenerative changes of the partially imaged thoracic and lumbar spine. Unchanged grade 1 anterolisthesis at L3-4. Postsurgical changes of the anterior abdominal wall. Asymmetric thickening of the left inferior rectus abdominus muscle with overlying subcutaneous soft tissue stranding and asymmetric cutaneous thickening. IMPRESSION: 1. Postsurgical changes of ileocecectomy. No interval reaccumulation of previously noted fluid collections. 2. Interval removal of left anterior lower quadrant approach drainage catheter. New asymmetric thickening of the left inferior rectus abdominus muscle, which may represent intramuscular hematoma. Increased density along the catheter tract with overlying subcutaneous soft  tissue stranding and asymmetric cutaneous thickening may represent cellulitis in the appropriate clinical setting. 3. Unchanged 13 mm right adrenal nodule measuring 10 HU, which may represent an adenoma. Inferior and medial to the right adrenal gland is a separate 9 mm nodule, unchanged, indeterminate. Recommend attention on planned PET. 4. A 3 mm peripheral left lower lobe nodule, unchanged compared to 04/11/2024. Electronically Signed  By: Limin  Xu M.D.   On: 05/18/2024 17:44    Procedures Procedures    Medications Ordered in ED Medications  lactated ringers  infusion (has no administration in time range)  lactated ringers  bolus 500 mL (has no administration in time range)  morphine  (PF) 4 MG/ML injection 4 mg (has no administration in time range)  ondansetron  (ZOFRAN ) injection 4 mg (has no administration in time range)  vancomycin (VANCOCIN) IVPB 1000 mg/200 mL premix (has no administration in time range)    Followed by  vancomycin (VANCOCIN) IVPB 1000 mg/200 mL premix (has no administration in time range)  piperacillin-tazobactam (ZOSYN) IVPB 3.375 g (has no administration in time range)  enoxaparin  (LOVENOX ) injection 40 mg (has no administration in time range)  lactated ringers  infusion (has no administration in time range)  piperacillin-tazobactam (ZOSYN) IVPB 3.375 g (has no administration in time range)  acetaminophen  (TYLENOL ) tablet 1,000 mg (has no administration in time range)  docusate sodium  (COLACE) capsule 100 mg (has no administration in time range)  ondansetron  (ZOFRAN -ODT) disintegrating tablet 4 mg (has no administration in time range)    Or  ondansetron  (ZOFRAN ) injection 4 mg (has no administration in time range)  simethicone  (MYLICON) chewable tablet 40 mg (has no administration in time range)  methocarbamol  (ROBAXIN ) tablet 1,000 mg (has no administration in time range)  traMADol (ULTRAM) tablet 50 mg (has no administration in time range)    ED Course/ Medical  Decision Making/ A&P                                 Medical Decision Making Amount and/or Complexity of Data Reviewed Labs: ordered. Decision-making details documented in ED Course.  Risk Prescription drug management. Decision regarding hospitalization.   Pt with multiple medical problems and comorbidities and presenting today with a complaint that caries a high risk for morbidity and mortality.  Here today due to abnormal CAT scan findings and now purulent drainage from the drain site in her left lower abdomen.  Patient having systemic symptoms earlier but reports feeling a little better now.  Vital signs are normal here.  CT scan done earlier today showed evidence of hematoma/cellulitis in the left lower quadrant by the abdominal rectus muscle.  I independently interpreted patient's labs and CBC today with a white count of 23 from 14 3 days ago, stable hemoglobin of 10, CMP without significant changes.  Discussed the case with pharmacy who recommended vancomycin and Zosyn given patient's recent procedures and hospitalizations to cover for a [facility]-acquired infection.  Discussed these findings with the patient.  Lactate is pending and blood cultures were sent.  Patient given IV fluids.  Will need admission for IV antibiotics.  Patient and her family member are aware and agreeable to this plan.  Will consult general surgery.  Spoke with Dr. Aniceto Barley who will admit the pt.         Final Clinical Impression(s) / ED Diagnoses Final diagnoses:  Abdominal wall abscess    Rx / DC Orders ED Discharge Orders     None         Almond Army, MD 05/18/24 1942

## 2024-05-18 NOTE — ED Notes (Signed)
 [facility] at bedside

## 2024-05-18 NOTE — H&P (Addendum)
 MRN: [id number] DOB: 09/24/1971 Subjective    Chief Complaint: Post Operative Visit (drain check/poss removal/)   History of Present Illness: Erin [facility] is a 53 y.o. female who is seen today for right colon cancer. The patient is a 53 year old white female who is about 3 weeks status post ileocecectomy for what turned out to be a stage IIIb right colon cancer. She recently returned home. She still has a vacuum dressing on her midline incision. Her appetite is good and her bowels are working normally. She denies any fevers or chills. Her drain is putting out minimal amounts of serosanguineous fluid.    Review of Systems: A complete review of systems was obtained from the patient. I have reviewed this information and discussed as appropriate with the patient. See HPI as well for other ROS.  ROS   Medical History: Past Medical History:  Diagnosis Date  Anemia  Anxiety  Arthritis  Asthma, unspecified asthma severity, unspecified whether complicated, unspecified whether persistent (HHS-HCC)  Chronic kidney disease  Depression  History of cancer  Hypertension  Insomnia  Obesity, morbid, BMI 40.0-49.9 (CMS/HHS-HCC)  Prediabetes  Sedative hypnotic or anxiolytic dependence (CMS/HHS-HCC)  Tremor   Patient Active Problem List  Diagnosis  Bilateral primary osteoarthritis of knee  Primary osteoarthritis of both knees  Cancer of right colon (CMS/HHS-HCC)   Past Surgical History:  Procedure Laterality Date  HYSTERECTOMY 09/2017  APPENDECTOMY    Allergies  Allergen Reactions  Ace Inhibitors Anaphylaxis   Current Outpatient Medications on File Prior to Visit  Medication Sig Dispense Refill  ACCU-CHEK AVIVA PLUS TEST STRP test strip USE TO CHECK BLOOD SUGAR TWICE WEEKLY 11  acetaminophen  (TYLENOL ) 500 MG tablet Take 1,000 mg by mouth every 6 (six) hours  albuterol  (PROVENTIL ) 2.5 mg /3 mL (0.083 %) nebulizer solution USE 1 VIAL VIA NEBULIZER EVERY 6 HOURS AS NEEDED FOR  WHEEZING OR SHORTNESS OF BREATH  amoxicillin -clavulanate (AUGMENTIN ) 875-125 mg tablet Take 1 tablet by mouth 2 (two) times daily  buPROPion  (WELLBUTRIN  XL) 300 MG XL tablet TK 1 T PO D 11  carvedilol  (COREG ) 12.5 MG tablet Take 18.75 mg by mouth 2 (two) times daily with meals 3  cetirizine  (ZYRTEC ) 10 MG tablet Take 10 mg by mouth once daily as needed for Allergies  eszopiclone  (LUNESTA ) 3 mg tablet Take 3 mg by mouth nightly as needed for Sleep Take immediately before bedtime.  famotidine  (PEPCID ) 20 MG tablet Take 20 mg by mouth 2 (two) times daily  fluconazole (DIFLUCAN) 150 MG tablet TK 1 T PO NOW. IF SYMPTOMS STILL PRESENT TAKE 3 DAYS LATER  fluticasone  propionate (FLONASE ) 50 mcg/actuation nasal spray Place 2 sprays into both nostrils once daily  FUROsemide  (LASIX ) 20 MG tablet Take 20 mg by mouth 2 (two) times daily  gabapentin  (NEURONTIN ) 300 MG capsule Take 300 mg by mouth once daily  HYDROcodone-acetaminophen  (NORCO) 10-325 mg tablet Take 1 tablet by mouth every 6 (six) hours as needed for Pain  lamoTRIgine  (LAMICTAL ) 25 MG tablet Take 2 tablets twice a day by oral route.  losartan  (COZAAR ) 100 MG tablet TK 1 T PO QD 11  methocarbamol  (ROBAXIN ) 750 MG tablet Take 750 mg by mouth 3 (three) times daily as needed  oxyBUTYnin  (DITROPAN -XL) 10 MG XL tablet Take 1 tablet by mouth at bedtime  oxyCODONE -acetaminophen  (PERCOCET) 10-325 mg tablet Take 1 tablet by mouth 3 (three) times daily as needed  peg-electrolyte (NULYTELY ) solution U UTD  phentermine (ADIPEX-P) 37.5 MG capsule Take 37.5 mg by mouth every morning  before breakfast  primidone  (MYSOLINE ) 50 MG tablet Take 300 mg by mouth nightly 2  sertraline  (ZOLOFT ) 50 MG tablet TK 1 T PO QD 5  spironolactone (ALDACTONE) 50 MG tablet TK 1 T PO BID 5  SYMBICORT  80-4.5 mcg/actuation inhaler USE 2 INHALATIONS BY MOUTH TWICE DAILY  tiZANidine (ZANAFLEX) 4 MG tablet Take 4 mg by mouth  VENTOLIN  HFA 90 mcg/actuation inhaler USE 2 INHALATIONS BY  MOUTH EVERY 6 HOURS AS NEEDED FOR WHEEZING OR SHORTNESS OF BREATH  zolpidem  (AMBIEN  CR) 12.5 MG CR tablet Take 12.5 mg by mouth at bedtime as needed   No current facility-administered medications on file prior to visit.   Family History  Problem Relation Age of Onset  High blood pressure (Hypertension) Mother  Hyperlipidemia (Elevated cholesterol) Mother  Diabetes Mother  Lupus Father    Social History   Tobacco Use  Smoking Status Never  Smokeless Tobacco Never    Social History   Socioeconomic History  Marital status: Widowed  Tobacco Use  Smoking status: Never  Smokeless tobacco: Never  Vaping Use  Vaping status: Never Used  Substance and Sexual Activity  Alcohol use: Not Currently  Drug use: Never   Social Drivers of Health   Financial Resource Strain: High Risk (04/26/2024)  Received from [facility]  Overall Financial Resource Strain (CARDIA)  Difficulty of Paying Living Expenses: Very hard  Food Insecurity: Food Insecurity Present (05/02/2024)  Received from [facility]  Hunger Vital Sign  Worried About Running Out of Food in the Last Year: Sometimes true  Ran Out of Food in the Last Year: Sometimes true  Transportation Needs: No Transportation Needs (04/30/2024)  Received from [facility] - Transportation  Lack of Transportation (Medical): No  Lack of Transportation (Non-Medical): No  Physical Activity: Unknown (04/26/2024)  Received from [facility]  Exercise Vital Sign  Days of Exercise per Week: 0 days  Stress: Stress Concern Present (04/26/2024)  Received from [facility] - Occupational Stress Questionnaire  Feeling of Stress : Very much  Social Connections: Patient Declined (04/26/2024)  Received from [facility]  Social Network  How would you rate your social network (family, work, friends)?: Patient declined  Housing Stability: High Risk (04/30/2024)  Received from [facility]  Housing Stability  Vital Sign  Unable to Pay for Housing in the Last Year: Yes  Number of Times Moved in the Last Year: 0  Homeless in the Last Year: No   Objective:   Vitals:  PainSc: 8  PainLoc: Abdomen   There is no height or weight on file to calculate BMI.  Physical Exam Vitals reviewed.  Constitutional:  General: She is not in acute distress. Appearance: Normal appearance.  HENT:  Head: Normocephalic and atraumatic.  Right Ear: External ear normal.  Left Ear: External ear normal.  Nose: Nose normal.  Mouth/Throat:  Mouth: Mucous membranes are moist.  Pharynx: Oropharynx is clear.  Eyes:  General: No scleral icterus. Extraocular Movements: Extraocular movements intact.  Conjunctiva/sclera: Conjunctivae normal.  Pupils: Pupils are equal, round, and reactive to light.  Cardiovascular:  Rate and Rhythm: Normal rate and regular rhythm.  Pulses: Normal pulses.  Heart sounds: Normal heart sounds.  Pulmonary:  Effort: Pulmonary effort is normal. No respiratory distress.  Breath sounds: Normal breath sounds.  Abdominal:  General: Bowel sounds are normal.  Palpations: Abdomen is soft.  Tenderness: There is no abdominal tenderness.  Comments: Vacuum dressing in place. Drain was removed today without  difficulty  Musculoskeletal:  General: No swelling, tenderness or deformity. Normal range of motion.  Cervical back: Normal range of motion and neck supple.  Skin: General: Skin is warm and dry.  Coloration: Skin is not jaundiced.  Neurological:  General: No focal deficit present.  Mental Status: She is alert and oriented to person, place, and time.  Psychiatric:  Mood and Affect: Mood normal.  Behavior: Behavior normal.     Labs, Imaging and Diagnostic Testing:  Assessment and Plan:   Diagnoses and all orders for this visit:  Cancer of right colon (CMS/HHS-HCC) - [facility] Case Posting Request; Future    The patient is about 3 weeks status post ileocecectomy for a right colon  cancer. At this point she is going to need chemotherapy and we have been asked to place a Port-A-Cath for her. I have discussed with her in detail the risks and benefits of the operation as well as some of the technical aspects including the risk of pneumothorax and she understands and wishes to proceed. We will plan for this in the near future. She will continue to vacuum dressing at home. I will plan to see her back in the next month or 2 to check her progress.  When she arrived today she had a painful swollen area in her left lower quadrant where her drain used to be.  This is concerning for a developing abscess.  Because of the risk of infection we are going to postpone her port today.  We will obtain a urgent CT scan to look for abscess.  If she does have an abscess then we will treat it appropriately.  If she does not have an abscess then we will reschedule her for the port in the next week or so.

## 2024-05-18 NOTE — ED Notes (Signed)
 Patient was still showing admitted at [facility] and had her discharged

## 2024-05-18 NOTE — H&P (Signed)
 Reason for Consult/Chief Complaint: abscess Consultant: Leida Puna, MD  Erin Bradford is an 53 y.o. female.   HPI: 8F s/p  ileocecectomy for presumed perf appy with final path c/w a right colon cancer. She was supposed to get a port 5/23, but presented with pain and swelling in her LLQ where her drain had been. Her primary surgeon, Dr. Alethea Andes, was concerned for an abscess and ordered a stat CT to be performed. She did not show for the CT, but presented to [facility] when she started draining from the site.   She reports to me that she had been having fevers and chills at home for the last week and n/v that began 5/23 in addition to the new drainage.    Past Medical History:  Diagnosis Date   Abnormal laboratory test    Acid reflux    Allergy    Anemia    Anxiety    Arthritis    back   Asthma    Blood transfusion without reported diagnosis    BMI 39.0-39.9,adult    Chronic hypokalemia    Chronic kidney disease    mild   Depression    GERD (gastroesophageal reflux disease)    H/O: hysterectomy    Heart murmur    Hypertension    Insomnia    Low back pain    Obesity    Occasional tremors    Palpitations    dx with tachycardia    PONV (postoperative nausea and vomiting)    Pre-diabetes    Sacroiliitis (HCC)    Tiredness    Urinary frequency     Past Surgical History:  Procedure Laterality Date   APPENDECTOMY     CESAREAN SECTION     COLON SURGERY     LAPAROSCOPIC APPENDECTOMY N/A 04/12/2024   Procedure: APPENDECTOMY, LAPAROSCOPIC;  Surgeon: Caralyn Chandler, MD;  Location: [facility];  Service: General;  Laterality: N/A;   TOOTH EXTRACTION     TOTAL LAPAROSCOPIC HYSTERECTOMY WITH SALPINGECTOMY Bilateral 10/06/2017   Procedure: ATTEMPTED HYSTERECTOMY TOTAL LAPAROSCOPIC WITH SALPINGECTOMY CONVERTED TO OPEN ABDOMINAL TOTAL HYSTERECTOMY WITH SALPINGECTOMY;  Surgeon: Loa Riling, DO;  Location: [facility];  Service: Gynecology;  Laterality: Bilateral;     Family History  Problem Relation Age of Onset   Diabetes Mother    Hypertension Mother    Hyperlipidemia Mother    Sarcoidosis Mother    Kidney disease Father    Atrial fibrillation Sister    Kidney disease Maternal Grandmother    Pancreatic cancer Maternal Grandmother    Diabetes Maternal Grandmother    Hypertension Maternal Grandmother    Cancer Maternal Grandfather    Colon cancer Neg Hx    Colon polyps Neg Hx    Esophageal cancer Neg Hx    Stomach cancer Neg Hx    Rectal cancer Neg Hx     Social History:  reports that she has never smoked. She has never used smokeless tobacco. She reports that she does not drink alcohol and does not use drugs.  Allergies:  Allergies  Allergen Reactions   Ace Inhibitors Anaphylaxis and Swelling    Swelling of tongue.    Medications: I have reviewed the patient's current medications.  Results for orders placed or performed during the hospital encounter of 05/18/24 (from the past 48 hours)  CBC with Differential/Platelet     Status: Abnormal   Collection Time: 05/18/24  6:04 PM  Result Value Ref Range   WBC 23.3 (H)  4.0 - 10.5 K/uL   RBC 3.55 (L) 3.87 - 5.11 MIL/uL   Hemoglobin 10.0 (L) 12.0 - 15.0 g/dL   HCT 54.0 (L) 98.1 - 19.1 %   MCV 87.3 80.0 - 100.0 fL   MCH 28.2 26.0 - 34.0 pg   MCHC 32.3 30.0 - 36.0 g/dL   RDW 47.8 29.5 - 62.1 %   Platelets 651 (H) 150 - 400 K/uL   nRBC 0.0 0.0 - 0.2 %   Neutrophils Relative % 75 %   Neutro Abs 17.5 (H) 1.7 - 7.7 K/uL   Lymphocytes Relative 12 %   Lymphs Abs 2.8 0.7 - 4.0 K/uL   Monocytes Relative 11 %   Monocytes Absolute 2.5 (H) 0.1 - 1.0 K/uL   Eosinophils Relative 1 %   Eosinophils Absolute 0.2 0.0 - 0.5 K/uL   Basophils Relative 0 %   Basophils Absolute 0.1 0.0 - 0.1 K/uL   Immature Granulocytes 1 %   Abs Immature Granulocytes 0.33 (H) 0.00 - 0.07 K/uL    Comment: Performed at [facility], [address] [place], [place] [address]  Comprehensive  metabolic panel with GFR     Status: Abnormal   Collection Time: 05/18/24  6:04 PM  Result Value Ref Range   Sodium 135 135 - 145 mmol/L   Potassium 3.8 3.5 - 5.1 mmol/L   Chloride 96 (L) 98 - 111 mmol/L   CO2 24 22 - 32 mmol/L   Glucose, Bld 169 (H) 70 - 99 mg/dL    Comment: Glucose reference range applies only to samples taken after fasting for at least 8 hours.   BUN 13 6 - 20 mg/dL   Creatinine, Ser 7.84 0.44 - 1.00 mg/dL   Calcium  9.7 8.9 - 10.3 mg/dL   Total Protein 7.1 6.5 - 8.1 g/dL   Albumin  3.7 3.5 - 5.0 g/dL   AST 45 (H) 15 - 41 U/L   ALT 53 (H) 0 - 44 U/L   Alkaline Phosphatase 225 (H) 38 - 126 U/L   Total Bilirubin 0.2 0.0 - 1.2 mg/dL   GFR, Estimated >69 >62 mL/min    Comment: (NOTE) Calculated using the CKD-EPI Creatinine Equation (2021)    Anion gap 15 5 - 15    Comment: Performed at [facility], [address], [place], [place] [address]    CT ABDOMEN PELVIS WO CONTRAST Result Date: 05/18/2024 CLINICAL DATA:  New diagnosis of cecal adenocarcinoma with left lower quadrant abdominal pain. Status post ileocecectomy complicated by fluid collections. * Tracking Code: BO * EXAM: CT ABDOMEN AND PELVIS WITHOUT CONTRAST TECHNIQUE: Multidetector CT imaging of the abdomen and pelvis was performed following the standard protocol without IV contrast. RADIATION DOSE REDUCTION: This exam was performed according to the departmental dose-optimization program which includes automated exposure control, adjustment of the mA and/or kV according to patient size and/or use of iterative reconstruction technique. COMPARISON:  CT abdomen and pelvis dated 04/18/2024 and multiple priors, CT chest dated 04/21/2024 FINDINGS: Lower chest: 3 mm peripheral left lower lobe nodule (4:10), unchanged compared to 04/11/2024. No pleural effusion or pneumothorax demonstrated. Partially imaged heart size is normal. Hepatobiliary: No focal hepatic lesions. No intra or extrahepatic biliary  ductal dilation. Normal gallbladder. Pancreas: No focal lesions or main ductal dilation. Spleen: Normal in size without focal abnormality. Adrenals/Urinary Tract: Unchanged 13 mm right adrenal nodule (2:18) measuring 10 HU. Inferior medial to the right adrenal gland is a separate 9 mm nodule (2:22), unchanged. No left adrenal  nodule. No suspicious renal mass on this noncontrast enhanced examination , calculi or hydronephrosis. Underdistended urinary bladder. Stomach/Bowel: Normal appearance of the stomach. Postsurgical changes of ileocecectomy. No evidence of bowel wall thickening, distention, or inflammatory changes. Appendectomy. Vascular/Lymphatic: No significant vascular findings are present. No enlarged abdominal or pelvic lymph nodes. Reproductive: No adnexal masses. Postsurgical change and mesenteric stranding is inseparable from the right ovary. Other: Interval removal of left anterior lower quadrant approach drainage catheter. There is increased density along the catheter tract. No free air. No interval reaccumulation of previously noted fluid collections. Trace pelvic free fluid and mesenteric stranding. Musculoskeletal: No acute or abnormal lytic or blastic osseous lesions. Multilevel degenerative changes of the partially imaged thoracic and lumbar spine. Unchanged grade 1 anterolisthesis at L3-4. Postsurgical changes of the anterior abdominal wall. Asymmetric thickening of the left inferior rectus abdominus muscle with overlying subcutaneous soft tissue stranding and asymmetric cutaneous thickening. IMPRESSION: 1. Postsurgical changes of ileocecectomy. No interval reaccumulation of previously noted fluid collections. 2. Interval removal of left anterior lower quadrant approach drainage catheter. New asymmetric thickening of the left inferior rectus abdominus muscle, which may represent intramuscular hematoma. Increased density along the catheter tract with overlying subcutaneous soft tissue stranding and  asymmetric cutaneous thickening may represent cellulitis in the appropriate clinical setting. 3. Unchanged 13 mm right adrenal nodule measuring 10 HU, which may represent an adenoma. Inferior and medial to the right adrenal gland is a separate 9 mm nodule, unchanged, indeterminate. Recommend attention on planned PET. 4. A 3 mm peripheral left lower lobe nodule, unchanged compared to 04/11/2024. Electronically Signed   By: Limin  Xu M.D.   On: 05/18/2024 17:44    ROS 10 point review of systems is negative except as listed above in HPI.   Physical Exam Blood pressure 107/61, pulse 73, temperature 98.1 F (36.7 C), resp. rate 18, height 5\' 6"  (1.676 m), weight 109.8 kg, last menstrual period 09/17/2017, SpO2 94%. Constitutional: well-developed, well-nourished HEENT: pupils equal, round, reactive to light, 2mm b/l, moist conjunctiva, external inspection of ears and nose normal, hearing intact Oropharynx: normal oropharyngeal mucosa, normal dentition Neck: no thyromegaly, trachea midline, no midline cervical tenderness to palpation Chest: breath sounds equal bilaterally, normal respiratory effort, no midline or lateral chest wall tenderness to palpation/deformity Abdomen: soft, midline vac, LLQ purulent drainage, NT, no bruising, no hepatosplenomegaly Skin: warm, dry, no rashes Psych: normal memory, normal mood/affect     Assessment/Plan: Anterior abdominal wall abscess - IV abx, d/w IR to see if drain placement is possible, may not be. Cont local wound care. Vac change today. FEN - NPO except sips/chips, okay for reg diet after procedure or if procedure not indicated DVT - SCDs, LMWH Dispo - med-surg    Anda Bamberg, MD General and Trauma Surgery [facility]

## 2024-05-19 NOTE — Progress Notes (Signed)
 Recommendation against drain placement by IR. Okay for regular diet. Abx, anticipate DC in AM on PO abx, with plan for short interval CT as outpatient

## 2024-05-19 NOTE — Progress Notes (Signed)
 Wound care was performed per order/protocol.   Wound Vac dressing was changed.   See Media for updated images.   Primary RN at bedside to assist with dressing change.    WOC RN not available, per AMION.

## 2024-05-20 LAB — CBC
HCT: 33.1 % — ABNORMAL LOW (ref 36.0–46.0)
Hemoglobin: 10.5 g/dL — ABNORMAL LOW (ref 12.0–15.0)
MCH: 28 pg (ref 26.0–34.0)
MCHC: 31.7 g/dL (ref 30.0–36.0)
MCV: 88.3 fL (ref 80.0–100.0)
Platelets: 708 10*3/uL — ABNORMAL HIGH (ref 150–400)
RBC: 3.75 MIL/uL — ABNORMAL LOW (ref 3.87–5.11)
RDW: 14.6 % (ref 11.5–15.5)
WBC: 12.1 10*3/uL — ABNORMAL HIGH (ref 4.0–10.5)
nRBC: 0 % (ref 0.0–0.2)

## 2024-05-20 MED ORDER — EYE WASH OP SOLN: 1.0000 [drp] | OPHTHALMIC | Status: DC | PRN

## 2024-05-20 MED ORDER — POLYVINYL ALCOHOL 1.4 % OP SOLN
1.0000 [drp] | OPHTHALMIC | Status: DC | PRN
  Filled 2024-05-20: qty 15

## 2024-05-20 MED ORDER — OXYCODONE HCL 5 MG PO TABS
5.0000 mg | ORAL_TABLET | ORAL | Status: DC | PRN
  Administered 2024-05-20 – 2024-05-21 (×3): 5 mg via ORAL
  Filled 2024-05-20 (×4): qty 1

## 2024-05-20 MED ORDER — IBUPROFEN 200 MG PO TABS
600.0000 mg | ORAL_TABLET | Freq: Four times a day (QID) | ORAL | Status: DC | PRN
  Administered 2024-05-20: 600 mg via ORAL
  Filled 2024-05-20: qty 3

## 2024-05-20 MED ORDER — LOPERAMIDE HCL 2 MG PO CAPS: 2.0000 mg | ORAL_CAPSULE | ORAL | Status: DC | PRN

## 2024-05-20 NOTE — Plan of Care (Signed)
   Problem: Education: Goal: Knowledge of General Education information will improve Description Including pain rating scale, medication(s)/side effects and non-pharmacologic comfort measures Outcome: Progressing   Problem: Health Behavior/Discharge Planning: Goal: Ability to manage health-related needs will improve Outcome: Progressing
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
## 2024-05-21 MED ORDER — CARVEDILOL 25 MG PO TABS
25.0000 mg | ORAL_TABLET | Freq: Two times a day (BID) | ORAL | Status: DC
Start: 2024-05-21 — End: 2024-05-21

## 2024-05-21 MED ORDER — BUPROPION HCL ER (XL) 150 MG PO TB24
300.0000 mg | ORAL_TABLET | Freq: Every day | ORAL | Status: DC
  Administered 2024-05-21: 300 mg via ORAL
  Filled 2024-05-21: qty 2

## 2024-05-21 MED ORDER — FAMOTIDINE 20 MG PO TABS
20.0000 mg | ORAL_TABLET | Freq: Two times a day (BID) | ORAL | Status: DC
  Administered 2024-05-21: 20 mg via ORAL
  Filled 2024-05-21: qty 1

## 2024-05-21 MED ORDER — LORATADINE 10 MG PO TABS
10.0000 mg | ORAL_TABLET | Freq: Every day | ORAL | Status: DC
  Administered 2024-05-21: 10 mg via ORAL
  Filled 2024-05-21: qty 1

## 2024-05-21 MED ORDER — ALBUTEROL SULFATE (2.5 MG/3ML) 0.083% IN NEBU: 2.5000 mg | INHALATION_SOLUTION | Freq: Four times a day (QID) | RESPIRATORY_TRACT | Status: DC | PRN

## 2024-05-21 MED ORDER — PRIMIDONE 50 MG PO TABS
50.0000 mg | ORAL_TABLET | Freq: Every morning | ORAL | Status: DC
  Administered 2024-05-21: 50 mg via ORAL
  Filled 2024-05-21: qty 1

## 2024-05-21 MED ORDER — GABAPENTIN 300 MG PO CAPS
600.0000 mg | ORAL_CAPSULE | Freq: Three times a day (TID) | ORAL | Status: DC
  Administered 2024-05-21: 600 mg via ORAL
  Filled 2024-05-21: qty 2

## 2024-05-21 MED ORDER — FUROSEMIDE 20 MG PO TABS
20.0000 mg | ORAL_TABLET | Freq: Two times a day (BID) | ORAL | Status: DC
  Administered 2024-05-21: 20 mg via ORAL
  Filled 2024-05-21: qty 1

## 2024-05-21 MED ORDER — SERTRALINE HCL 100 MG PO TABS
100.0000 mg | ORAL_TABLET | Freq: Every day | ORAL | Status: DC
Start: 2024-05-21 — End: 2024-05-21
  Administered 2024-05-21: 100 mg via ORAL
  Filled 2024-05-21: qty 1

## 2024-05-21 MED ORDER — FLUTICASONE PROPIONATE 50 MCG/ACT NA SUSP: 2.0000 | Freq: Every day | NASAL | Status: DC | PRN

## 2024-05-21 MED ORDER — FLUCONAZOLE 150 MG PO TABS: 150.0000 mg | ORAL_TABLET | Freq: Once | ORAL | 0 refills | Status: AC

## 2024-05-21 MED ORDER — AMOXICILLIN-POT CLAVULANATE 875-125 MG PO TABS: 1.0000 | ORAL_TABLET | Freq: Two times a day (BID) | ORAL | 0 refills | Status: AC

## 2024-05-21 MED ORDER — OXYBUTYNIN CHLORIDE ER 10 MG PO TB24
10.0000 mg | ORAL_TABLET | Freq: Every day | ORAL | Status: DC
  Filled 2024-05-21: qty 1

## 2024-05-21 MED ORDER — SPIRONOLACTONE 25 MG PO TABS
100.0000 mg | ORAL_TABLET | Freq: Two times a day (BID) | ORAL | Status: DC
  Administered 2024-05-21: 100 mg via ORAL
  Filled 2024-05-21: qty 4

## 2024-05-21 MED ORDER — LOPERAMIDE HCL 2 MG PO TABS: 2.0000 mg | ORAL_TABLET | Freq: Four times a day (QID) | ORAL | 0 refills | Status: AC | PRN

## 2024-05-21 MED ORDER — TIZANIDINE HCL 4 MG PO TABS: 4.0000 mg | ORAL_TABLET | Freq: Three times a day (TID) | ORAL | Status: DC | PRN

## 2024-05-21 MED ORDER — MOMETASONE FURO-FORMOTEROL FUM 100-5 MCG/ACT IN AERO
2.0000 | INHALATION_SPRAY | Freq: Two times a day (BID) | RESPIRATORY_TRACT | Status: DC
Start: 2024-05-21 — End: 2024-05-21
  Administered 2024-05-21: 2 via RESPIRATORY_TRACT
  Filled 2024-05-21: qty 8.8

## 2024-05-21 NOTE — Consult Note (Signed)
 WOC Nurse Consult Note: Asked to apply home unit prior to discharge  Wound is fully granulated today with new epithelial growth noted to periphery  Will implement twice daily NS moist gauze dressing for now.   Reason for Consult: midline abdominal wound care  Abdominal abscess opening wound care Wound type: 1.  Surgical  2.  Infectious  Pressure Injury POA:NA Measurement: Left abdominal pannus:  0.4 cm nonintact lesion with scant purulence noted.  Periwound is indurated and warm to touch.  Dressing removed, area cleansed and NS moist gauze dressing replaced,  Midline abdominal incision  12 cm x 3.5 cm x 0.1 cm fully granulated Wound bed:100% beefy red with epithelial growth noted around periphery.  Drainage (amount, consistency, odor) minimal serosanguinous to midline Scant purulence with left pannus wound with palpation.  Periwound: intact some induration to pannus periwound Dressing procedure/placement/frequency: Cleanse wounds to midline abdomen and left abdominal pannus with NS and pat dry. Apply NS moist gauze to wound bed. Secure with dry gauze and tape. Change twice daily.  Discussed wound healing with patient.  Increased protein, adequate water, advance activity as tolerated.  Discussed symptoms of infection to call MD about. Fever, malaise, redness, increased warmth to periwound. She agrees to complete her antibiotics.  Her daughter has been taught to perform NS moist gauze dressing.   She understands to take her home [facility] unit home so [facility] nurse can return to [facility]  for her Will not follow at this time.  Please re-consult if needed.  Branda Cain MSN, RN, FNP-BC CWON Wound, Ostomy, Continence Nurse Outpatient [facility] [phone] Pager [phone]

## 2024-05-21 NOTE — Progress Notes (Signed)
 Halie R Tatro to be D/C'd Home per MD order.  Discussed with the patient and all questions fully answered.  VSS, Skin clean, dry and intact without evidence of skin break down, no evidence of skin tears noted. IV catheter discontinued intact. Site without signs and symptoms of complications. Dressing and pressure applied.  An After Visit Summary was printed and given to the patient. Patient prescriptions sent to her pharmacy.  D/c education completed with patient/family including follow up instructions, medication list, d/c activities limitations if indicated, with other d/c instructions as indicated by MD - patient able to verbalize understanding, all questions fully answered.   Patient instructed to return to ED, call 911, or call MD for any changes in condition.   Patient escorted via WC, and D/C home via private auto.  Lonnell Rob 05/21/2024 12:58 PM

## 2024-05-21 NOTE — Discharge Instructions (Signed)
 WOC Nurse Consult Note: Asked to apply home unit prior to discharge  Wound is fully granulated today with new epithelial growth noted to periphery  Will implement twice daily NS moist gauze dressing for now.   Reason for Consult: midline abdominal wound care  Abdominal abscess opening wound care Wound type: 1.  Surgical  2.  Infectious  Pressure Injury POA:NA Measurement: Left abdominal pannus:  0.4 cm nonintact lesion with scant purulence noted.  Periwound is indurated and warm to touch.  Dressing removed, area cleansed and NS moist gauze dressing replaced,  Midline abdominal incision  12 cm x 3.5 cm x 0.1 cm fully granulated Wound bed:100% beefy red with epithelial growth noted around periphery.  Drainage (amount, consistency, odor) minimal serosanguinous to midline Scant purulence with left pannus wound with palpation.  Periwound: intact some induration to pannus periwound Dressing procedure/placement/frequency: Cleanse wounds to midline abdomen and left abdominal pannus with NS and pat dry. Apply NS moist gauze to wound bed. Secure with dry gauze and tape. Change twice daily.  Discussed wound healing with patient.  Increased protein, adequate water, advance activity as tolerated.  Discussed symptoms of infection to call MD about. Fever, malaise, redness, increased warmth to periwound. She agrees to complete her antibiotics.  Her daughter has been taught to perform NS moist gauze dressing.   She understands to take her home [facility] unit home so [facility] nurse can return to [facility]  for her

## 2024-05-21 NOTE — TOC Transition Note (Signed)
 Transition of Care [facility]) - Discharge Note   Patient Details  Name: Erin Bradford MRN: [id number] Date of Birth: 03/14/1971  Transition of Care [facility]) CM/SW Contact:  Tom-Johnson, Angelique Ken, RN Phone Number: 05/21/2024, 10:56 AM   Clinical Narrative:     Patient is scheduled for discharge today.  Readmission Risk Assessment done. Home health resumption of care info, Outpatient f/u, hospital f/u and discharge instructions on AVS. Wound Vac changed at bedside by WOC.  Aunt, Lauretta to transport at discharge.  No further TOC needs noted.        Final next level of care: Home w Home Health Services Barriers to Discharge: Barriers Resolved   Patient Goals and CMS Choice Patient states their goals for this hospitalization and ongoing recovery are:: To return home CMS Medicare.gov Compare Post Acute Care list provided to:: Patient Choice offered to / list presented to : Patient      Discharge Placement                Patient to be transferred to facility by: Aunt Name of family member notified: Shirlyn Dowdy    Discharge Plan and Services Additional resources added to the After Visit Summary for                  DME Arranged: N/A DME Agency: NA       HH Arranged: RN (Resumption of care) HH Agency: [facility] Date [facility] Agency Contacted: 05/21/24 Time HH Agency Contacted: 1050 Representative spoke with at [facility] Agency: Loetta Ringer  Social Drivers of Health (SDOH) Interventions SDOH Screenings   Food Insecurity: No Food Insecurity (05/18/2024)  Recent Concern: Food Insecurity - Food Insecurity Present (05/02/2024)  Housing: Low Risk  (05/18/2024)  Recent Concern: Housing - High Risk (04/30/2024)  Transportation Needs: No Transportation Needs (05/18/2024)  Utilities: Not At Risk (05/18/2024)  Depression (PHQ2-9): Medium Risk (04/30/2024)  Financial Resource Strain: High Risk (04/26/2024)   Received from [facility]  Physical Activity: Unknown (04/26/2024)    Received from [facility]  Social Connections: Patient Declined (04/26/2024)   Received from [facility]  Stress: Stress Concern Present (04/26/2024)   Received from [facility]  Tobacco Use: Low Risk  (05/18/2024)     Readmission Risk Interventions    05/21/2024   10:54 AM  Readmission Risk Prevention Plan  Transportation Screening Complete  PCP or Specialist Appt within 5-7 Days Complete  Home Care Screening Complete  Medication Review (RN CM) Referral to Pharmacy

## 2024-05-21 NOTE — Plan of Care (Signed)
Discharge instructions discussed with patient.  Patient instructed on home medications, restrictions, and follow up appointments. Belongings gathered and sent with patient.  Patients medications sent to pharmacy
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
## 2024-05-21 NOTE — Progress Notes (Signed)
 05/20/24 2200 Patient was tearful tonight complaining that she has not been receiving her home medications in particular her home pain med Percocet.  She was also complaining that she wanted Imodium to stop her loose BM's.  On call was contacted and patient received Oxy IR every 4 hours PRN for pain.  Patient stated that she would like to speak with the rounding doctor tomorrow.  05/21/24 0224 Patient stated the OXY IR helped with her pain.
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
## 2024-05-22 ENCOUNTER — Other Ambulatory Visit: Payer: Self-pay

## 2024-05-22 ENCOUNTER — Telehealth: Payer: Self-pay | Admitting: Nurse Practitioner

## 2024-05-22 ENCOUNTER — Inpatient Hospital Stay

## 2024-05-22 NOTE — Progress Notes (Signed)
 [facility] Clinical Social Work  Clinical Social Work was referred by medical provider for assessment of psychosocial needs (Counseling).  Clinical Social Worker contacted patient by phone to offer support and assess for needs. CSW explained clinical services available to patient at [facility] (adjustment counseling to diagnosis).   Patient is currently connected with counseling  and med management with  [facility], no upcoming scheduled appointments. CSW completed PHQ - 9 assessment (score of 12), patient attributed distress reported in PHQ-9  to recent hospitalization and cancer diagnosis (no SI/HI or current crisis)   Due to patient being connected to care, patient elected to contact current provider to set up appointment regarding presenting concerns.   CSW will follow up with patient to assist with care coordination as needed.   Maudie Sorrow, LCSW  Clinical Social Worker [facility]

## 2024-05-22 NOTE — Telephone Encounter (Signed)
 Erin Bradford reports she wants to cancel her 6/11 appointments for MD visit and treatment. Was not able to have port placed. Was in hospital for several days and just discharged on 05/21/24 due to abscess. WoundVac has been removed and she is on po antibiotics. Sees Dr. Alethea Andes on 6/5 and will determine if she is well enough for port to be placed. PET is scheduled for 5/30. Per Dr. Scherrie Curt, will need to push out the PET scan 2 weeks. She is aware of need to change

## 2024-05-23 ENCOUNTER — Ambulatory Visit: Admitting: Oncology

## 2024-05-23 ENCOUNTER — Encounter: Admitting: Nutrition

## 2024-05-23 ENCOUNTER — Other Ambulatory Visit

## 2024-05-23 ENCOUNTER — Ambulatory Visit

## 2024-05-23 LAB — CULTURE, BLOOD (ROUTINE X 2)
Culture: NO GROWTH
Special Requests: ADEQUATE

## 2024-05-24 ENCOUNTER — Ambulatory Visit (HOSPITAL_COMMUNITY): Admitting: Clinical

## 2024-05-24 ENCOUNTER — Telehealth: Payer: Self-pay | Admitting: *Deleted

## 2024-05-24 LAB — CULTURE, BLOOD (ROUTINE X 2): Special Requests: ADEQUATE

## 2024-05-24 NOTE — Telephone Encounter (Signed)
 Erin Bradford with appointment for PET scan on 6/13 at 0900/0930. Will also get her chemo and OV moved to week of 6/18 after the scan and scheduler will call. She agrees to this plan.

## 2024-05-25 ENCOUNTER — Encounter (HOSPITAL_COMMUNITY)

## 2024-05-25 ENCOUNTER — Ambulatory Visit

## 2024-05-28 ENCOUNTER — Other Ambulatory Visit (HOSPITAL_COMMUNITY): Payer: Self-pay

## 2024-05-28 DIAGNOSIS — F411 Generalized anxiety disorder: Secondary | ICD-10-CM

## 2024-05-28 DIAGNOSIS — F330 Major depressive disorder, recurrent, mild: Secondary | ICD-10-CM

## 2024-05-28 MED ORDER — BUPROPION HCL ER (XL) 300 MG PO TB24: 300.0000 mg | ORAL_TABLET | Freq: Every day | ORAL | 0 refills | Status: DC

## 2024-05-28 MED ORDER — SERTRALINE HCL 100 MG PO TABS: 100.0000 mg | ORAL_TABLET | Freq: Every day | ORAL | 0 refills | Status: DC

## 2024-05-31 NOTE — Progress Notes (Signed)
 Pharmacist Chemotherapy Monitoring - Initial Assessment    Anticipated start date: 06/12/24   The following has been reviewed per standard work regarding the patient's treatment regimen: The patient's diagnosis, treatment plan and drug doses, and organ/hematologic function Lab orders and baseline tests specific to treatment regimen  The treatment plan start date, drug sequencing, and pre-medications Prior authorization status  Patient's documented medication list, including drug-drug interaction screen and prescriptions for anti-emetics and supportive care specific to the treatment regimen The drug concentrations, fluid compatibility, administration routes, and timing of the medications to be used The patient's access for treatment and lifetime cumulative dose history, if applicable  The patient's medication allergies and previous infusion related reactions, if applicable   Changes made to treatment plan:  treatment plan date  Follow up needed:  N/A   Cannie Muckle Kreul, [facility], 05/31/2024  3:45 PM

## 2024-06-01 ENCOUNTER — Other Ambulatory Visit: Payer: Self-pay | Admitting: Cardiology

## 2024-06-01 ENCOUNTER — Encounter (HOSPITAL_COMMUNITY): Payer: Self-pay | Admitting: General Surgery

## 2024-06-01 ENCOUNTER — Other Ambulatory Visit: Payer: Self-pay

## 2024-06-01 NOTE — Progress Notes (Signed)
 PCP - [facility] Cardiologist - Will Cortland Ding, MD  EKG - 05/18/24 ECHO - 11/26/19  Anesthesia review: Y  Patient verbally denies any shortness of breath, fever, cough and chest pain during phone call   -------------  SDW INSTRUCTIONS given:  Your procedure is scheduled on Monday, June 9th.  Report to [facility] Main Entrance "A" at 0600 A.M., and check in at the Admitting office.  Call this number if you have problems the morning of surgery:  [phone]   Remember:  Do not eat after midnight the night before your surgery  You may drink clear liquids until 0530 the morning of your surgery.   Clear liquids allowed are: Water, Non-Citrus Juices (without pulp), Carbonated Beverages, Clear Tea, Black Coffee Only, and Gatorade    Take these medicines the morning of surgery with A SIP OF WATER  buPROPion  (WELLBUTRIN  XL)  carvedilol  (COREG )  famotidine  (PEPCID )  gabapentin  (NEURONTIN )  sertraline  (ZOLOFT )  SYMBICORT   oxyCODONE -acetaminophen  (PERCOCET)  primidone  (MYSOLINE )  acetaminophen  (TYLENOL )-if needed albuterol  (PROVENTIL )-if needed fluticasone  (FLONASE )-if needed tiZANidine  (ZANAFLEX )-if needed VENTOLIN -if needed (please bring on the day of surgery)  As of today, STOP taking any Aspirin (unless otherwise instructed by your surgeon) Aleve, Naproxen, Ibuprofen , Motrin , Advil , Goody's, BC's, all herbal medications, fish oil, and all vitamins.                      Do not wear jewelry, make up, or nail polish            Do not wear lotions, powders, perfumes/colognes, or deodorant.            Do not shave 48 hours prior to surgery.  Men may shave face and neck.            Do not bring valuables to the hospital.            [facility] is not responsible for any belongings or valuables.  Do NOT Smoke (Tobacco/Vaping) 24 hours prior to your procedure If you use a CPAP at night, you may bring all equipment for your overnight stay.   Contacts,  glasses, dentures or bridgework may not be worn into surgery.      For patients admitted to the hospital, discharge time will be determined by your treatment team.   Patients discharged the day of surgery will not be allowed to drive home, and someone needs to stay with them for 24 hours.    Special instructions:   [facility]- Preparing For Surgery  Before surgery, you can play an important role. Because skin is not sterile, your skin needs to be as free of germs as possible. You can reduce the number of germs on your skin by washing with CHG (chlorahexidine gluconate) Soap before surgery.  CHG is an antiseptic cleaner which kills germs and bonds with the skin to continue killing germs even after washing.    Oral Hygiene is also important to reduce your risk of infection.  Remember - BRUSH YOUR TEETH THE MORNING OF SURGERY WITH YOUR REGULAR TOOTHPASTE  Please do not use if you have an allergy to CHG or antibacterial soaps. If your skin becomes reddened/irritated stop using the CHG.  Do not shave (including legs and underarms) for at least 48 hours prior to first CHG shower. It is OK to shave your face.  Please follow these instructions carefully.   Shower the NIGHT BEFORE SURGERY and the MORNING OF SURGERY with DIAL Soap.  Pat yourself dry with a CLEAN TOWEL.  Wear CLEAN PAJAMAS to bed the night before surgery  Place CLEAN SHEETS on your bed the night of your first shower and DO NOT SLEEP WITH PETS.   Day of Surgery: Please shower morning of surgery  Wear Clean/Comfortable clothing the morning of surgery Do not apply any deodorants/lotions.   Remember to brush your teeth WITH YOUR REGULAR TOOTHPASTE.   Questions were answered. Patient verbalized understanding of instructions.

## 2024-06-01 NOTE — Progress Notes (Signed)
 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 She presented to ED on 04/11/24 with several day history of abdominal/flank pain with N/V. CT scan concerning for appendicits with abscess, incidental right adrenal nodule and LLL pulmonary nodule. She underwent attempted laparoscopic appendectomy but required conversation to open for appendectomy/ileocecectomy by Dr. Alethea Andes. Wound VAC placed to abdomen prior to discharge. 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 She was referred to oncologist Dr. Scherrie Curt. Adjuvant FOLFOX therapy advised to decreased risk of recurrence. Referred for Port placement. Per notes, "Her case was presented at the GI tumor conference. No further surgery is recommended. PET scan recommended to evaluate the anterior mediastinal nodule." PET scan is scheduled for 06/08/24.   Surgery was planned in May, but postponed for an abdominal wound infection that has since resolved.   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   She was evaluated by EP Dr. Lawana Pray in 11/2016 for palpitations with inverted inferior T waves on EKG. She wore a 14 day monitor that showed no arrhythmias with symptoms associated with SR/ST. In 2020 she had an echo after disability screener heard a murmur. 11/26/19 TTE showed LVEF 60-65%, normal RV systolic function, trace MR, estimated RVSP  moderately elevated at 49 mmHg, no VSD or ASD. Last APP follow-up on 11/02/22 for palpitations. She reported continued occasional palpitations lasting ~ 20 seconds once a week--worse with stress and poor sleep. Some limitations in mobility but walking her dog and was working on weight loss. She denied chest pain, dyspnea, syncope, edema.   Anesthesia team to evaluate on the day of surgery. As of 05/20/24, H/H 10.5/33.1, PLT 708K. Glucose 169, AST 45, ALT 53, Cr 0.95 on 05/18/24.     VS: LMP 09/17/2017 (Approximate)  Wt Readings from Last 3 Encounters:  05/18/24 109.8 kg  05/18/24 109.6 kg  05/16/24 110 kg   Temp Readings from Last 3 Encounters:  05/21/24 36.9 C (Oral)  05/18/24 37.1 C (Oral)  05/16/24 36.7 C (Temporal)   BP Readings from Last 3 Encounters:  05/21/24 (!) 161/110  05/18/24 (!) 145/79  05/16/24 139/83   Pulse Readings from Last 3 Encounters:  05/21/24 83  05/18/24 91  05/16/24 88     PROVIDERS: [facility] is PCP  Coni Deep, MD is HEM-ONC Camnitz, Will, MD is EP (for palpitations, unremarkable monitor 2017)  LABS: Most recent results in [facility] include: Lab Results  Component Value Date   WBC 12.1 (H) 05/20/2024   HGB 10.5 (L) 05/20/2024   HCT 33.1 (L) 05/20/2024   PLT 708 (H) 05/20/2024   GLUCOSE 169 (H) 05/18/2024   ALT 53 (H) 05/18/2024   AST 45 (H) 05/18/2024   NA 135 05/18/2024   K 3.8 05/18/2024   CL 96 (L) 05/18/2024   CREATININE 0.95 05/18/2024   BUN 13 05/18/2024   CO2 24 05/18/2024   HGBA1C 6.2 (H) 03/23/2023     IMAGES: CT Abd/pelvis  05/18/24: IMPRESSION: 1. Postsurgical changes of ileocecectomy. No interval reaccumulation of previously noted fluid collections. 2. Interval removal of left anterior lower quadrant approach drainage catheter. New asymmetric thickening of the left inferior rectus abdominus muscle, which may represent intramuscular hematoma. Increased density along the catheter tract with  overlying subcutaneous soft tissue stranding and asymmetric cutaneous thickening may represent cellulitis in the appropriate clinical setting. 3. Unchanged 13 mm right adrenal nodule measuring 10 HU, which may represent an adenoma. Inferior and medial to the right adrenal gland is a separate 9 mm nodule, unchanged, indeterminate. Recommend attention on planned PET. 4. A 3 mm peripheral left lower lobe nodule, unchanged compared to 04/11/2024.   CT Chest 04/21/24: IMPRESSION: 1. Tiny bilateral pulmonary nodules are nonspecific, unlikely to represent metastatic disease. These can be re-evaluated on chest CT at 6 months. 2. Soft tissue density in the anterior mediastinum of 1.4 cm is favored to represent isolated adenopathy-which could be reactive or represent an atypical distribution of colon cancer metastasis. Differential considerations include thymic hyperplasia or neoplasm. This could be re-evaluated on chest CT at 6 months. A more aggressive approach would include PET. 3. Age advanced coronary artery atherosclerosis. Recommend assessment of coronary risk factors. 4.  Aortic Atherosclerosis (ICD10-I70.0).     EKG: 05/18/24: Normal sinus rhythm Normal ECG When compared with ECG of 19-Jul-2018 22:21, No significant change was found Confirmed by Carson Clara [phone]) on 05/19/2024 5:05:11 PM   CV: Echo 11/26/19: IMPRESSIONS   1. Left ventricular ejection fraction, by visual estimation, is 60 to  65%. The left ventricle has normal function. There is no left ventricular  hypertrophy.   2. Global right ventricle has normal systolic function.The right  ventricular size is normal. No increase in right ventricular wall  thickness.   3. Left atrial size was normal.   4. Right atrial size was normal.   5. The mitral valve is normal in structure. Trace mitral valve  regurgitation. No evidence of mitral stenosis.   6. The tricuspid valve is normal in structure. Tricuspid  valve  regurgitation is not demonstrated.   7. The aortic valve is normal in structure. Aortic valve regurgitation is  not visualized. No evidence of aortic valve sclerosis or stenosis.   8. The pulmonic valve was normal in structure. Pulmonic valve  regurgitation is not visualized.   9. Moderately elevated pulmonary artery systolic pressure. The estimated right ventricular systolic pressure is moderately elevated at 49.0 mmHg.  10. The inferior vena cava is normal in size with greater than 50%  respiratory variability, suggesting right atrial pressure of 3 mmHg.  11. No atrial level shunt detected by color-flow Doppler.  No ventricular septal defect detected.  No evidence of an atrial septal defect.    Cardiac monitor 12/14/16 - 12/27/16: Sinus rhythm and sinus tachycardia All symptoms associated with sinus rhythm and sinus tachycardia No arrhythmias seen Zero atrial fibrillation  Past Medical History:  Diagnosis Date   Abnormal laboratory test    Acid reflux    Allergy    Anemia    Anxiety    Arthritis    back   Asthma    Blood transfusion without reported diagnosis    BMI 39.0-39.9,adult    Chronic hypokalemia    Chronic kidney disease    mild   Depression    GERD (gastroesophageal reflux disease)    H/O: hysterectomy    Heart murmur    Hypertension    Insomnia    Low back pain  Obesity    Occasional tremors    Palpitations    dx with tachycardia    PONV (postoperative nausea and vomiting)    Pre-diabetes    Sacroiliitis (HCC)    Tiredness    Urinary frequency     Past Surgical History:  Procedure Laterality Date   APPENDECTOMY     CESAREAN SECTION     COLON SURGERY     LAPAROSCOPIC APPENDECTOMY N/A 04/12/2024   Procedure: APPENDECTOMY, LAPAROSCOPIC;  Surgeon: Caralyn Chandler, MD;  Location: [facility];  Service: General;  Laterality: N/A;   TOOTH EXTRACTION     TOTAL LAPAROSCOPIC HYSTERECTOMY WITH SALPINGECTOMY Bilateral 10/06/2017   Procedure: ATTEMPTED  HYSTERECTOMY TOTAL LAPAROSCOPIC WITH SALPINGECTOMY CONVERTED TO OPEN ABDOMINAL TOTAL HYSTERECTOMY WITH SALPINGECTOMY;  Surgeon: Loa Riling, DO;  Location: [facility];  Service: Gynecology;  Laterality: Bilateral;    MEDICATIONS: No current facility-administered medications for this encounter.    acetaminophen  (TYLENOL ) 500 MG tablet   albuterol  (PROVENTIL ) (2.5 MG/3ML) 0.083% nebulizer solution   buPROPion  (WELLBUTRIN  XL) 300 MG 24 hr tablet   carvedilol  (COREG ) 25 MG tablet   cetirizine  (ZYRTEC ) 10 MG tablet   famotidine  (PEPCID ) 20 MG tablet   fluticasone  (FLONASE ) 50 MCG/ACT nasal spray   furosemide  (LASIX ) 20 MG tablet   gabapentin  (NEURONTIN ) 300 MG capsule   loperamide  (IMODIUM  A-D) 2 MG tablet   oxybutynin  (DITROPAN -XL) 10 MG 24 hr tablet   oxyCODONE -acetaminophen  (PERCOCET) 10-325 MG tablet   primidone  (MYSOLINE ) 50 MG tablet   sertraline  (ZOLOFT ) 100 MG tablet   spironolactone  (ALDACTONE ) 100 MG tablet   SYMBICORT  80-4.5 MCG/ACT inhaler   tiZANidine  (ZANAFLEX ) 4 MG tablet   VENTOLIN  HFA 108 (90 Base) MCG/ACT inhaler   zolpidem  (AMBIEN  CR) 12.5 MG CR tablet    Ella Gun, PA-C Surgical Short Stay/Anesthesiology [facility] Phone [phone] [facility] Phone [phone] 06/01/2024 11:57 AM

## 2024-06-01 NOTE — Anesthesia Preprocedure Evaluation (Addendum)
 Anesthesia Evaluation  Patient identified by MRN, date of birth, ID band Patient awake    Reviewed: Allergy & Precautions, NPO status , Patient's Chart, lab work & pertinent test results, reviewed documented beta blocker date and time   History of Anesthesia Complications (+) PONV and history of anesthetic complications  Airway Mallampati: III  TM Distance: >3 FB   Mouth opening: Limited Mouth Opening  Dental no notable dental hx.    Pulmonary asthma , neg sleep apnea, neg COPD, neg recent URI   breath sounds clear to auscultation       Cardiovascular hypertension, (-) CAD, (-) Past MI and (-) Cardiac Stents + Valvular Problems/Murmurs  Rhythm:Regular Rate:Normal  IMPRESSIONS     1. Left ventricular ejection fraction, by visual estimation, is 60 to  65%. The left ventricle has normal function. There is no left ventricular  hypertrophy.   2. Global right ventricle has normal systolic function.The right  ventricular size is normal. No increase in right ventricular wall  thickness.   3. Left atrial size was normal.   4. Right atrial size was normal.   5. The mitral valve is normal in structure. Trace mitral valve  regurgitation. No evidence of mitral stenosis.   6. The tricuspid valve is normal in structure. Tricuspid valve  regurgitation is not demonstrated.   7. The aortic valve is normal in structure. Aortic valve regurgitation is  not visualized. No evidence of aortic valve sclerosis or stenosis.   8. The pulmonic valve was normal in structure. Pulmonic valve  regurgitation is not visualized.   9. Moderately elevated pulmonary artery systolic pressure.  10. The inferior vena cava is normal in size with greater than 50%  respiratory variability, suggesting right atrial pressure of 3 mmHg.      Neuro/Psych neg Seizures PSYCHIATRIC DISORDERS Anxiety Depression       GI/Hepatic ,GERD  ,,  Endo/Other     Renal/GU CRFRenal disease     Musculoskeletal  (+) Arthritis ,    Abdominal   Peds  Hematology  (+) Blood dyscrasia, anemia   Anesthesia Other Findings   Reproductive/Obstetrics                              Anesthesia Physical Anesthesia Plan  ASA: 2  Anesthesia Plan: General   Post-op Pain Management:    Induction:   PONV Risk Score and Plan: 3 and TIVA  Airway Management Planned: LMA  Additional Equipment:   Intra-op Plan:   Post-operative Plan: Extubation in OR  Informed Consent: I have reviewed the patients History and Physical, chart, labs and discussed the procedure including the risks, benefits and alternatives for the proposed anesthesia with the patient or authorized representative who has indicated his/her understanding and acceptance.     Dental advisory given  Plan Discussed with: CRNA  Anesthesia Plan Comments: (PAT note written 06/01/2024 by Allison Zelenak, PA-C.  )        Anesthesia Quick Evaluation

## 2024-06-04 ENCOUNTER — Encounter (HOSPITAL_COMMUNITY): Payer: Self-pay | Admitting: General Surgery

## 2024-06-04 ENCOUNTER — Ambulatory Visit (HOSPITAL_COMMUNITY)

## 2024-06-04 ENCOUNTER — Other Ambulatory Visit: Payer: Self-pay

## 2024-06-04 ENCOUNTER — Ambulatory Visit (HOSPITAL_COMMUNITY)
Admission: RE | Admit: 2024-06-04 | Discharge: 2024-06-04 | Disposition: A | Discharge status: Home / Self Care | Attending: General Surgery | Admitting: General Surgery

## 2024-06-04 ENCOUNTER — Encounter (HOSPITAL_COMMUNITY)
Admission: RE | Disposition: A | Discharge status: Home / Self Care | Payer: Self-pay | Source: Home / Self Care | Attending: General Surgery

## 2024-06-04 ENCOUNTER — Ambulatory Visit (HOSPITAL_COMMUNITY): Payer: Self-pay | Admitting: Vascular Surgery

## 2024-06-04 DIAGNOSIS — D649 Anemia, unspecified: Secondary | ICD-10-CM | POA: Diagnosis not present

## 2024-06-04 DIAGNOSIS — K219 Gastro-esophageal reflux disease without esophagitis: Secondary | ICD-10-CM | POA: Insufficient documentation

## 2024-06-04 DIAGNOSIS — C189 Malignant neoplasm of colon, unspecified: Secondary | ICD-10-CM

## 2024-06-04 DIAGNOSIS — M1990 Unspecified osteoarthritis, unspecified site: Secondary | ICD-10-CM | POA: Insufficient documentation

## 2024-06-04 DIAGNOSIS — J45909 Unspecified asthma, uncomplicated: Secondary | ICD-10-CM | POA: Diagnosis not present

## 2024-06-04 DIAGNOSIS — Z6838 Body mass index (BMI) 38.0-38.9, adult: Secondary | ICD-10-CM | POA: Diagnosis not present

## 2024-06-04 DIAGNOSIS — I700 Atherosclerosis of aorta: Secondary | ICD-10-CM | POA: Insufficient documentation

## 2024-06-04 DIAGNOSIS — C182 Malignant neoplasm of ascending colon: Secondary | ICD-10-CM | POA: Diagnosis not present

## 2024-06-04 DIAGNOSIS — F419 Anxiety disorder, unspecified: Secondary | ICD-10-CM | POA: Insufficient documentation

## 2024-06-04 DIAGNOSIS — Z7951 Long term (current) use of inhaled steroids: Secondary | ICD-10-CM | POA: Diagnosis not present

## 2024-06-04 DIAGNOSIS — Z9049 Acquired absence of other specified parts of digestive tract: Secondary | ICD-10-CM | POA: Diagnosis not present

## 2024-06-04 DIAGNOSIS — I2510 Atherosclerotic heart disease of native coronary artery without angina pectoris: Secondary | ICD-10-CM | POA: Insufficient documentation

## 2024-06-04 DIAGNOSIS — N189 Chronic kidney disease, unspecified: Secondary | ICD-10-CM | POA: Insufficient documentation

## 2024-06-04 DIAGNOSIS — F32A Depression, unspecified: Secondary | ICD-10-CM | POA: Insufficient documentation

## 2024-06-04 DIAGNOSIS — M170 Bilateral primary osteoarthritis of knee: Secondary | ICD-10-CM | POA: Diagnosis not present

## 2024-06-04 DIAGNOSIS — Z79899 Other long term (current) drug therapy: Secondary | ICD-10-CM | POA: Insufficient documentation

## 2024-06-04 DIAGNOSIS — I129 Hypertensive chronic kidney disease with stage 1 through stage 4 chronic kidney disease, or unspecified chronic kidney disease: Secondary | ICD-10-CM | POA: Insufficient documentation

## 2024-06-04 HISTORY — DX: Tachycardia, unspecified: R00.0

## 2024-06-04 HISTORY — PX: PORTACATH PLACEMENT: SHX2246

## 2024-06-04 HISTORY — DX: Malignant (primary) neoplasm, unspecified: C80.1

## 2024-06-04 SURGERY — INSERTION, TUNNELED CENTRAL VENOUS DEVICE, WITH PORT
Anesthesia: General | Site: Chest | Laterality: Left

## 2024-06-04 MED ORDER — OXYCODONE HCL 5 MG/5ML PO SOLN: 5.0000 mg | Freq: Once | ORAL | Status: DC | PRN

## 2024-06-04 MED ORDER — FENTANYL CITRATE (PF) 250 MCG/5ML IJ SOLN
INTRAMUSCULAR | Status: DC | PRN
  Administered 2024-06-04: 50 ug via INTRAVENOUS

## 2024-06-04 MED ORDER — LIDOCAINE 2% (20 MG/ML) 5 ML SYRINGE
INTRAMUSCULAR | Status: DC | PRN
  Administered 2024-06-04: 100 mg via INTRAVENOUS

## 2024-06-04 MED ORDER — LACTATED RINGERS IV SOLN: INTRAVENOUS | Status: DC

## 2024-06-04 MED ORDER — HEPARIN 6000 UNIT IRRIGATION SOLUTION
Status: DC | PRN
  Administered 2024-06-04: 1

## 2024-06-04 MED ORDER — CHLORHEXIDINE GLUCONATE 0.12 % MT SOLN
15.0000 mL | Freq: Once | OROMUCOSAL | Status: AC
  Administered 2024-06-04: 15 mL via OROMUCOSAL
  Filled 2024-06-04: qty 15

## 2024-06-04 MED ORDER — MIDAZOLAM HCL 2 MG/2ML IJ SOLN
INTRAMUSCULAR | Status: DC | PRN
  Administered 2024-06-04: 2 mg via INTRAVENOUS

## 2024-06-04 MED ORDER — CARVEDILOL 12.5 MG PO TABS
ORAL_TABLET | ORAL | Status: AC
  Administered 2024-06-04: 25 mg via ORAL
  Filled 2024-06-04: qty 2

## 2024-06-04 MED ORDER — LIDOCAINE 2% (20 MG/ML) 5 ML SYRINGE
INTRAMUSCULAR | Status: AC
Start: 2024-06-04 — End: ?
  Filled 2024-06-04: qty 5

## 2024-06-04 MED ORDER — DEXAMETHASONE SODIUM PHOSPHATE 10 MG/ML IJ SOLN
INTRAMUSCULAR | Status: AC
  Filled 2024-06-04: qty 1

## 2024-06-04 MED ORDER — EPHEDRINE SULFATE-NACL 50-0.9 MG/10ML-% IV SOSY
PREFILLED_SYRINGE | INTRAVENOUS | Status: DC | PRN
  Administered 2024-06-04 (×3): 5 mg via INTRAVENOUS

## 2024-06-04 MED ORDER — DEXAMETHASONE SODIUM PHOSPHATE 10 MG/ML IJ SOLN
INTRAMUSCULAR | Status: DC | PRN
  Administered 2024-06-04: 10 mg via INTRAVENOUS

## 2024-06-04 MED ORDER — 0.9 % SODIUM CHLORIDE (POUR BTL) OPTIME
TOPICAL | Status: DC | PRN
  Administered 2024-06-04: 1000 mL

## 2024-06-04 MED ORDER — PROPOFOL 10 MG/ML IV BOLUS
INTRAVENOUS | Status: AC
  Filled 2024-06-04: qty 20

## 2024-06-04 MED ORDER — ONDANSETRON HCL 4 MG/2ML IJ SOLN
INTRAMUSCULAR | Status: AC
  Filled 2024-06-04: qty 2

## 2024-06-04 MED ORDER — HEPARIN SOD (PORK) LOCK FLUSH 100 UNIT/ML IV SOLN
INTRAVENOUS | Status: AC
  Filled 2024-06-04: qty 5

## 2024-06-04 MED ORDER — HEPARIN 6000 UNIT IRRIGATION SOLUTION
Status: AC
  Filled 2024-06-04: qty 500

## 2024-06-04 MED ORDER — CARVEDILOL 25 MG PO TABS
25.0000 mg | ORAL_TABLET | Freq: Once | ORAL | Status: AC
  Filled 2024-06-04: qty 1

## 2024-06-04 MED ORDER — FENTANYL CITRATE (PF) 100 MCG/2ML IJ SOLN: 25.0000 ug | INTRAMUSCULAR | Status: DC | PRN

## 2024-06-04 MED ORDER — HEPARIN SOD (PORK) LOCK FLUSH 100 UNIT/ML IV SOLN
INTRAVENOUS | Status: AC
Start: 2024-06-04 — End: ?
  Filled 2024-06-04: qty 5

## 2024-06-04 MED ORDER — FENTANYL CITRATE (PF) 250 MCG/5ML IJ SOLN
INTRAMUSCULAR | Status: AC
  Filled 2024-06-04: qty 5

## 2024-06-04 MED ORDER — MIDAZOLAM HCL 2 MG/2ML IJ SOLN
INTRAMUSCULAR | Status: AC
  Filled 2024-06-04: qty 2

## 2024-06-04 MED ORDER — ONDANSETRON HCL 4 MG/2ML IJ SOLN
INTRAMUSCULAR | Status: DC | PRN
  Administered 2024-06-04: 4 mg via INTRAVENOUS

## 2024-06-04 MED ORDER — PROPOFOL 10 MG/ML IV BOLUS
INTRAVENOUS | Status: DC | PRN
  Administered 2024-06-04: 150 mg via INTRAVENOUS
  Administered 2024-06-04: 100 ug/kg/min via INTRAVENOUS

## 2024-06-04 MED ORDER — PHENYLEPHRINE 80 MCG/ML (10ML) SYRINGE FOR IV PUSH (FOR BLOOD PRESSURE SUPPORT)
PREFILLED_SYRINGE | INTRAVENOUS | Status: DC | PRN
  Administered 2024-06-04 (×3): 80 ug via INTRAVENOUS

## 2024-06-04 MED ORDER — ACETAMINOPHEN 10 MG/ML IV SOLN: 1000.0000 mg | Freq: Once | INTRAVENOUS | Status: DC | PRN

## 2024-06-04 MED ORDER — CEFAZOLIN SODIUM-DEXTROSE 2-3 GM-%(50ML) IV SOLR
INTRAVENOUS | Status: DC | PRN
  Administered 2024-06-04: 2 g via INTRAVENOUS

## 2024-06-04 MED ORDER — PHENYLEPHRINE 80 MCG/ML (10ML) SYRINGE FOR IV PUSH (FOR BLOOD PRESSURE SUPPORT)
PREFILLED_SYRINGE | INTRAVENOUS | Status: AC
  Filled 2024-06-04: qty 10

## 2024-06-04 MED ORDER — OXYCODONE HCL 5 MG PO TABS: 5.0000 mg | ORAL_TABLET | Freq: Four times a day (QID) | ORAL | 0 refills | Status: DC | PRN

## 2024-06-04 MED ORDER — ORAL CARE MOUTH RINSE
15.0000 mL | Freq: Once | OROMUCOSAL | Status: AC
Start: 2024-06-04 — End: 2024-06-04

## 2024-06-04 MED ORDER — HEPARIN SOD (PORK) LOCK FLUSH 100 UNIT/ML IV SOLN
INTRAVENOUS | Status: DC | PRN
  Administered 2024-06-04 (×2): 500 [IU]

## 2024-06-04 MED ORDER — OXYCODONE HCL 5 MG PO TABS: 5.0000 mg | ORAL_TABLET | Freq: Once | ORAL | Status: DC | PRN

## 2024-06-04 MED ORDER — EPHEDRINE 5 MG/ML INJ
INTRAVENOUS | Status: AC
  Filled 2024-06-04: qty 5

## 2024-06-04 MED ORDER — BUPIVACAINE HCL (PF) 0.25 % IJ SOLN
INTRAMUSCULAR | Status: AC
Start: 2024-06-04 — End: ?
  Filled 2024-06-04: qty 30

## 2024-06-04 MED ORDER — ONDANSETRON HCL 4 MG/2ML IJ SOLN: 4.0000 mg | Freq: Once | INTRAMUSCULAR | Status: DC | PRN

## 2024-06-04 MED ORDER — BUPIVACAINE HCL (PF) 0.25 % IJ SOLN
INTRAMUSCULAR | Status: DC | PRN
  Administered 2024-06-04: 8 mL

## 2024-06-04 SURGICAL SUPPLY — 31 items
APPLICATOR CHLORAPREP 10.5 ORG (MISCELLANEOUS) ×2 IMPLANT
BAG COUNTER SPONGE SURGICOUNT (BAG) ×2 IMPLANT
BAG DECANTER FOR FLEXI CONT (MISCELLANEOUS) ×2 IMPLANT
COVER SURGICAL LIGHT HANDLE (MISCELLANEOUS) ×2 IMPLANT
COVER TRANSDUCER ULTRASND GEL (DISPOSABLE) IMPLANT
DERMABOND ADVANCED .7 DNX12 (GAUZE/BANDAGES/DRESSINGS) ×2 IMPLANT
DRAPE C-ARM 42X120 X-RAY (DRAPES) ×2 IMPLANT
ELECT CAUTERY BLADE 6.4 (BLADE) ×2 IMPLANT
ELECTRODE REM PT RTRN 9FT ADLT (ELECTROSURGICAL) ×2 IMPLANT
GEL ULTRASOUND 20GR AQUASONIC (MISCELLANEOUS) IMPLANT
GLOVE BIO SURGEON STRL SZ7.5 (GLOVE) ×2 IMPLANT
GOWN STRL REUS W/ TWL LRG LVL3 (GOWN DISPOSABLE) ×4 IMPLANT
KIT BASIN OR (CUSTOM PROCEDURE TRAY) ×2 IMPLANT
KIT PORT POWER 8FR ISP CVUE (Port) IMPLANT
KIT TURNOVER KIT B (KITS) ×2 IMPLANT
NDL 22X1.5 STRL (OR ONLY) (MISCELLANEOUS) IMPLANT
NEEDLE 22X1.5 STRL (OR ONLY) (MISCELLANEOUS) ×1 IMPLANT
NS IRRIG 1000ML POUR BTL (IV SOLUTION) ×2 IMPLANT
PAD ARMBOARD POSITIONER FOAM (MISCELLANEOUS) ×2 IMPLANT
PENCIL BUTTON HOLSTER BLD 10FT (ELECTRODE) ×2 IMPLANT
POSITIONER HEAD DONUT 9IN (MISCELLANEOUS) ×2 IMPLANT
SHEATH COOK PEEL AWAY SET 9F (SHEATH) IMPLANT
SPIKE FLUID TRANSFER (MISCELLANEOUS) ×2 IMPLANT
SUT MNCRL AB 4-0 PS2 18 (SUTURE) ×2 IMPLANT
SUT PROLENE 2 0 SH 30 (SUTURE) ×2 IMPLANT
SUT VIC AB 3-0 SH 27XBRD (SUTURE) ×2 IMPLANT
SYR 10ML LL (SYRINGE) IMPLANT
SYR 5ML LUER SLIP (SYRINGE) ×2 IMPLANT
TOWEL GREEN STERILE (TOWEL DISPOSABLE) ×2 IMPLANT
TOWEL GREEN STERILE FF (TOWEL DISPOSABLE) ×2 IMPLANT
TRAY LAPAROSCOPIC MC (CUSTOM PROCEDURE TRAY) ×2 IMPLANT

## 2024-06-04 NOTE — Transfer of Care (Signed)
 Immediate Anesthesia Transfer of Care Note  Patient: Erin Bradford  Procedure(s) Performed: INSERTION, TUNNELED CENTRAL VENOUS DEVICE, WITH PORT (Left: Chest)  Patient Location: PACU  Anesthesia Type:General  Level of Consciousness: awake  Airway & Oxygen Therapy: Patient Spontanous Breathing and Patient connected to face mask oxygen  Post-op Assessment: Report given to RN, Post -op Vital signs reviewed and stable, and Patient moving all extremities X 4  Post vital signs: Reviewed and stable  Last Vitals:  Vitals Value Taken Time  BP 143/77 06/04/24 0903  Temp    Pulse 61 06/04/24 0909  Resp 13 06/04/24 0909  SpO2 94 % 06/04/24 0909  Vitals shown include unfiled device data.  Last Pain:  Vitals:   06/04/24 0701  TempSrc:   PainSc: 0-No pain         Complications: No notable events documented.

## 2024-06-04 NOTE — H&P (Signed)
 MRN: [id number] DOB: 1971/12/08 Subjective    Chief Complaint: RE-CHECK   History of Present Illness: Erin Bradford is a 53 y.o. female who is seen today for right colon cancer. The patient is a 53 year old white female who is about 7 weeks status post ileocecectomy for what turned out to be a stage IIIb right colon cancer. She recently returned home. She still has a vacuum dressing on her midline incision. Her appetite is good and her bowels are working normally. She denies any fevers or chills. She was scheduled for a port last week but developed a abdominal wall infection and had to be postponed. This has since resolved and she is now ready to have her port placed.   Review of Systems: A complete review of systems was obtained from the patient. I have reviewed this information and discussed as appropriate with the patient. See HPI as well for other ROS.  ROS   Medical History: Past Medical History:  Diagnosis Date  Anemia  Anxiety  Arthritis  Asthma, unspecified asthma severity, unspecified whether complicated, unspecified whether persistent (HHS-HCC)  Chronic kidney disease  Depression  History of cancer  Hypertension  Insomnia  Obesity, morbid, BMI 40.0-49.9 (CMS/HHS-HCC)  Prediabetes  Sedative hypnotic or anxiolytic dependence (CMS/HHS-HCC)  Tremor   Patient Active Problem List  Diagnosis  Bilateral primary osteoarthritis of knee  Primary osteoarthritis of both knees  Cancer of right colon (CMS/HHS-HCC)   Past Surgical History:  Procedure Laterality Date  HYSTERECTOMY 09/2017  APPENDECTOMY    Allergies  Allergen Reactions  Ace Inhibitors Anaphylaxis   Current Outpatient Medications on File Prior to Visit  Medication Sig Dispense Refill  ACCU-CHEK AVIVA PLUS TEST STRP test strip USE TO CHECK BLOOD SUGAR TWICE WEEKLY 11  acetaminophen  (TYLENOL ) 500 MG tablet Take 1,000 mg by mouth every 6 (six) hours  albuterol  (PROVENTIL ) 2.5 mg /3 mL (0.083 %) nebulizer  solution USE 1 VIAL VIA NEBULIZER EVERY 6 HOURS AS NEEDED FOR WHEEZING OR SHORTNESS OF BREATH  amoxicillin -clavulanate (AUGMENTIN ) 875-125 mg tablet Take 1 tablet by mouth 2 (two) times daily  buPROPion  (WELLBUTRIN  XL) 300 MG XL tablet TK 1 T PO D 11  carvedilol  (COREG ) 12.5 MG tablet Take 18.75 mg by mouth 2 (two) times daily with meals 3  cetirizine  (ZYRTEC ) 10 MG tablet Take 10 mg by mouth once daily as needed for Allergies  eszopiclone  (LUNESTA ) 3 mg tablet Take 3 mg by mouth nightly as needed for Sleep Take immediately before bedtime.  famotidine  (PEPCID ) 20 MG tablet Take 20 mg by mouth 2 (two) times daily  fluconazole  (DIFLUCAN ) 150 MG tablet TK 1 T PO NOW. IF SYMPTOMS STILL PRESENT TAKE 3 DAYS LATER  fluticasone  propionate (FLONASE ) 50 mcg/actuation nasal spray Place 2 sprays into both nostrils once daily  FUROsemide  (LASIX ) 20 MG tablet Take 20 mg by mouth 2 (two) times daily  gabapentin  (NEURONTIN ) 300 MG capsule Take 300 mg by mouth once daily  HYDROcodone-acetaminophen  (NORCO) 10-325 mg tablet Take 1 tablet by mouth every 6 (six) hours as needed for Pain  lamoTRIgine  (LAMICTAL ) 25 MG tablet Take 2 tablets twice a day by oral route.  losartan  (COZAAR ) 100 MG tablet TK 1 T PO QD 11  methocarbamol  (ROBAXIN ) 750 MG tablet Take 750 mg by mouth 3 (three) times daily as needed  oxyBUTYnin  (DITROPAN -XL) 10 MG XL tablet Take 1 tablet by mouth at bedtime  oxyCODONE -acetaminophen  (PERCOCET) 10-325 mg tablet Take 1 tablet by mouth 3 (three) times daily as needed  peg-electrolyte (  NULYTELY ) solution U UTD  phentermine (ADIPEX-P) 37.5 MG capsule Take 37.5 mg by mouth every morning before breakfast  primidone  (MYSOLINE ) 50 MG tablet Take 300 mg by mouth nightly 2  sertraline  (ZOLOFT ) 50 MG tablet TK 1 T PO QD 5  spironolactone  (ALDACTONE ) 50 MG tablet TK 1 T PO BID 5  SYMBICORT  80-4.5 mcg/actuation inhaler USE 2 INHALATIONS BY MOUTH TWICE DAILY  tiZANidine  (ZANAFLEX ) 4 MG tablet Take 4 mg by  mouth  VENTOLIN  HFA 90 mcg/actuation inhaler USE 2 INHALATIONS BY MOUTH EVERY 6 HOURS AS NEEDED FOR WHEEZING OR SHORTNESS OF BREATH  zolpidem  (AMBIEN  CR) 12.5 MG CR tablet Take 12.5 mg by mouth at bedtime as needed   No current facility-administered medications on file prior to visit.   Family History  Problem Relation Age of Onset  High blood pressure (Hypertension) Mother  Hyperlipidemia (Elevated cholesterol) Mother  Diabetes Mother  Lupus Father    Social History   Tobacco Use  Smoking Status Never  Smokeless Tobacco Never    Social History   Socioeconomic History  Marital status: Widowed  Tobacco Use  Smoking status: Never  Smokeless tobacco: Never  Vaping Use  Vaping status: Never Used  Substance and Sexual Activity  Alcohol  use: Not Currently  Drug use: Never   Social Drivers of Health   Financial Resource Strain: High Risk (04/26/2024)  Received from [facility]  Overall Financial Resource Strain (CARDIA)  Difficulty of Paying Living Expenses: Very hard  Food Insecurity: No Food Insecurity (05/18/2024)  Received from [place]  Hunger Vital Sign  Worried About Running Out of Food in the Last Year: Never true  Ran Out of Food in the Last Year: Never true  Recent Concern: Food Insecurity - Food Insecurity Present (05/02/2024)  Received from [facility]  Hunger Vital Sign  Worried About Running Out of Food in the Last Year: Sometimes true  Ran Out of Food in the Last Year: Sometimes true  Transportation Needs: No Transportation Needs (05/18/2024)  Received from [facility] - Transportation  Lack of Transportation (Medical): No  Lack of Transportation (Non-Medical): No  Physical Activity: Unknown (04/26/2024)  Received from [facility]  Exercise Vital Sign  Days of Exercise per Week: 0 days  Stress: Stress Concern Present (04/26/2024)  Received from [facility] - Occupational Stress Questionnaire   Feeling of Stress : Very much  Social Connections: Patient Declined (04/26/2024)  Received from [facility]  Social Network  How would you rate your social network (family, work, friends)?: Patient declined  Housing Stability: Low Risk (05/18/2024)  Received from [facility]  Housing Stability Vital Sign  Unable to Pay for Housing in the Last Year: No  Number of Times Moved in the Last Year: 1  Homeless in the Last Year: No  Recent Concern: Housing Stability - High Risk (04/30/2024)  Received from [facility] Stability Vital Sign  Unable to Pay for Housing in the Last Year: Yes  Number of Times Moved in the Last Year: 0  Homeless in the Last Year: No   Objective:   Vitals:  PainSc: 0-No pain   There is no height or weight on file to calculate BMI.  Physical Exam Vitals reviewed.  Constitutional:  General: She is not in acute distress. Appearance: Normal appearance.  HENT:  Head: Normocephalic and atraumatic.  Right Ear: External ear normal.  Left Ear: External ear normal.  Nose: Nose normal.  Mouth/Throat:  Mouth: Mucous membranes are moist.  Pharynx: Oropharynx is clear.  Eyes:  General: No scleral icterus. Extraocular Movements: Extraocular movements intact.  Conjunctiva/sclera: Conjunctivae normal.  Pupils: Pupils are equal, round, and reactive to light.  Cardiovascular:  Rate and Rhythm: Normal rate and regular rhythm.  Pulses: Normal pulses.  Heart sounds: Normal heart sounds.  Pulmonary:  Effort: Pulmonary effort is normal. No respiratory distress.  Breath sounds: Normal breath sounds.  Abdominal:  General: Bowel sounds are normal.  Palpations: Abdomen is soft.  Tenderness: There is no abdominal tenderness.  Comments: The midline incision is almost completely healed. The left lower quadrant induration is present but no fluctuance or cellulitis.  Musculoskeletal:  General: No swelling, tenderness or deformity. Normal range of motion.  Cervical  back: Normal range of motion and neck supple.  Skin: General: Skin is warm and dry.  Coloration: Skin is not jaundiced.  Neurological:  General: No focal deficit present.  Mental Status: She is alert and oriented to person, place, and time.  Psychiatric:  Mood and Affect: Mood normal.  Behavior: Behavior normal.     Labs, Imaging and Diagnostic Testing:  Assessment and Plan:   Diagnoses and all orders for this visit:  Cancer of right colon (CMS/HHS-HCC)    The patient is about 7 weeks status post ileocecectomy for a right colon cancer. At this point she is going to need chemotherapy and we have been asked to place a Port-A-Cath for her. I have discussed with her in detail the risks and benefits of the operation as well as some of the technical aspects including the risk of pneumothorax and she understands and wishes to proceed. We will plan to place the port on Monday.

## 2024-06-04 NOTE — Anesthesia Procedure Notes (Signed)
 Procedure Name: LMA Insertion Date/Time: 06/04/2024 8:17 AM  Performed by: Dawna Etienne, CRNAPre-anesthesia Checklist: Patient identified, Emergency Drugs available, Suction available and Patient being monitored Patient Re-evaluated:Patient Re-evaluated prior to induction Oxygen Delivery Method: Circle system utilized Preoxygenation: Pre-oxygenation with 100% oxygen Induction Type: IV induction Ventilation: Mask ventilation without difficulty LMA: LMA flexible inserted LMA Size: 4.0 Number of attempts: 1 Placement Confirmation: positive ETCO2 and breath sounds checked- equal and bilateral Tube secured with: Tape Dental Injury: Teeth and Oropharynx as per pre-operative assessment

## 2024-06-04 NOTE — Interval H&P Note (Signed)
 History and Physical Interval Note:  06/04/2024 7:24 AM  Erin Bradford  has presented today for surgery, with the diagnosis of RIGHT COLON CANCER.  The various methods of treatment have been discussed with the patient and family. After consideration of risks, benefits and other options for treatment, the patient has consented to  Procedure(s) with comments: INSERTION, TUNNELED CENTRAL VENOUS DEVICE, WITH PORT (N/A) - PORT PLACEMENT WITH ULTRASOUND GUIDANCE as a surgical intervention.  The patient's history has been reviewed, patient examined, no change in status, stable for surgery.  I have reviewed the patient's chart and labs.  Questions were answered to the patient's satisfaction.     [name]

## 2024-06-04 NOTE — Anesthesia Postprocedure Evaluation (Signed)
 Anesthesia Post Note  Patient: Erin Bradford  Procedure(s) Performed: INSERTION, TUNNELED CENTRAL VENOUS DEVICE, WITH PORT (Left: Chest)     Patient location during evaluation: PACU Anesthesia Type: General Level of consciousness: awake and alert Pain management: pain level controlled Vital Signs Assessment: post-procedure vital signs reviewed and stable Respiratory status: spontaneous breathing, nonlabored ventilation, respiratory function stable and patient connected to nasal cannula oxygen Cardiovascular status: blood pressure returned to baseline and stable Postop Assessment: no apparent nausea or vomiting Anesthetic complications: no   No notable events documented.  Last Vitals:  Vitals:   06/04/24 0915 06/04/24 0930  BP: (!) 148/85 (!) 155/90  Pulse: (!) 57 (!) 55  Resp: (!) 8 15  Temp:  36.5 C  SpO2: 94% 93%    Last Pain:  Vitals:   06/04/24 0930  TempSrc:   PainSc: 0-No pain                 Leslye Rast

## 2024-06-04 NOTE — Op Note (Signed)
 06/04/2024  8:57 AM  PATIENT:  Erin Bradford  53 y.o. female  PRE-OPERATIVE DIAGNOSIS:  RIGHT COLON CANCER  POST-OPERATIVE DIAGNOSIS:  RIGHT COLON CANCER  PROCEDURE:  Procedure(s) with comments: INSERTION, TUNNELED CENTRAL VENOUS DEVICE, WITH PORT (Left) -   SURGEON:  Surgeons and Role:    * Caralyn Chandler, MD - Primary  PHYSICIAN ASSISTANT:   ASSISTANTS: none   ANESTHESIA:   local and general  EBL:  minimal   BLOOD ADMINISTERED:none  DRAINS: none   LOCAL MEDICATIONS USED:  MARCAINE      SPECIMEN:  No Specimen  DISPOSITION OF SPECIMEN:  N/A  COUNTS:  YES  TOURNIQUET:  * No tourniquets in log *  DICTATION: .Dragon Dictation  After informed consent was obtained the patient was brought to the operating room and placed in the supine position on the operating table.  After adequate induction of general anesthesia a roll was placed between the patient's shoulder blades to extend the shoulder slightly.  The left chest and neck area were prepped with ChloraPrep, allowed to dry, and draped in usual sterile manner.  An appropriate timeout was performed.  The area lateral to the bend of the clavicle on the left chest wall was infiltrated with quarter percent Marcaine .  The patient was placed in Trendelenburg position.  A large bore needle from the Port-A-Cath kit was used to slide beneath the bend of the clavicle heading towards the sternal notch and in doing so I was able to access the left subclavian vein without difficulty.  A wire was fed through the needle using the Seldinger technique without difficulty.  The wire was confirmed in the central venous system using real-time fluoroscopy.  Next a small incision was made at the wire entry site on the left chest wall with a 15 blade knife.  The incision was carried through the skin and subcutaneous tissue sharply with the electrocautery.  A subcutaneous pocket was created inferior to the incision by blunt finger dissection.  Next the  tubing was placed on the reservoir.  The reservoir was placed in the pocket and the length of the tubing was estimated using real-time fluoroscopy.  The tubing was cut to the appropriate length.  Next a sheath and dilator were fed over the wire using the Seldinger technique without difficulty.  The dilator and wire were then removed from the patient.  The tubing was fed through the sheath as far as it would go and then held in place while the sheath was gently cracked and separated.  Another real-time fluoroscopy image showed the tip of the catheter to be in the distal superior vena cava.  The tubing was then permanently anchored to the reservoir.  The reservoir was anchored in the pocket with two 2-0 Prolene stitches.  The port was then aspirated and it aspirated blood easily.  The port was then flushed initially with a dilute heparin solution and then with a more concentrated heparin solution.  The subcutaneous tissue was then closed over the port with interrupted 3-0 Vicryl stitches.  The skin was closed with interrupted 4-0 Monocryl subcuticular stitches.  Dermabond dressings were applied.  The patient tolerated the procedure well.  At the end of the case all needle sponge and instrument counts were correct.  The patient was then awakened and taken to recovery in stable condition.  PLAN OF CARE: Discharge to home after PACU  PATIENT DISPOSITION:  PACU - hemodynamically stable.   Delay start of Pharmacological VTE agent (>24hrs) due  to surgical blood loss or risk of bleeding: not applicable

## 2024-06-05 ENCOUNTER — Encounter (HOSPITAL_COMMUNITY): Payer: Self-pay | Admitting: General Surgery

## 2024-06-06 ENCOUNTER — Other Ambulatory Visit

## 2024-06-06 ENCOUNTER — Encounter

## 2024-06-06 ENCOUNTER — Ambulatory Visit

## 2024-06-06 ENCOUNTER — Telehealth (HOSPITAL_COMMUNITY): Admitting: Psychiatry

## 2024-06-06 ENCOUNTER — Ambulatory Visit: Admitting: Nurse Practitioner

## 2024-06-07 ENCOUNTER — Telehealth (HOSPITAL_COMMUNITY): Admitting: Psychiatry

## 2024-06-08 ENCOUNTER — Ambulatory Visit (HOSPITAL_COMMUNITY)
Admission: RE | Admit: 2024-06-08 | Discharge: 2024-06-08 | Disposition: A | Discharge status: Home / Self Care | Source: Ambulatory Visit | Attending: Nurse Practitioner | Admitting: Nurse Practitioner

## 2024-06-08 DIAGNOSIS — C182 Malignant neoplasm of ascending colon: Secondary | ICD-10-CM | POA: Insufficient documentation

## 2024-06-08 LAB — GLUCOSE, CAPILLARY: Glucose-Capillary: 129 mg/dL — ABNORMAL HIGH (ref 70–99)

## 2024-06-08 MED ORDER — FLUDEOXYGLUCOSE F - 18 (FDG) INJECTION
12.0300 | Freq: Once | INTRAVENOUS | Status: AC
  Administered 2024-06-08: 12.03 via INTRAVENOUS

## 2024-06-12 ENCOUNTER — Inpatient Hospital Stay

## 2024-06-12 ENCOUNTER — Inpatient Hospital Stay (HOSPITAL_BASED_OUTPATIENT_CLINIC_OR_DEPARTMENT_OTHER): Admitting: Nurse Practitioner

## 2024-06-12 ENCOUNTER — Encounter: Payer: Self-pay | Admitting: *Deleted

## 2024-06-12 ENCOUNTER — Inpatient Hospital Stay: Attending: Oncology

## 2024-06-12 ENCOUNTER — Encounter: Payer: Self-pay | Admitting: Nurse Practitioner

## 2024-06-12 ENCOUNTER — Telehealth: Payer: Self-pay

## 2024-06-12 VITALS — BP 128/75 | HR 76 | Temp 97.9°F | Resp 18 | Ht 66.0 in | Wt 246.2 lb

## 2024-06-12 DIAGNOSIS — R21 Rash and other nonspecific skin eruption: Secondary | ICD-10-CM | POA: Insufficient documentation

## 2024-06-12 DIAGNOSIS — R350 Frequency of micturition: Secondary | ICD-10-CM | POA: Diagnosis not present

## 2024-06-12 DIAGNOSIS — C182 Malignant neoplasm of ascending colon: Secondary | ICD-10-CM

## 2024-06-12 DIAGNOSIS — Z800 Family history of malignant neoplasm of digestive organs: Secondary | ICD-10-CM | POA: Insufficient documentation

## 2024-06-12 DIAGNOSIS — C787 Secondary malignant neoplasm of liver and intrahepatic bile duct: Secondary | ICD-10-CM | POA: Insufficient documentation

## 2024-06-12 DIAGNOSIS — M7989 Other specified soft tissue disorders: Secondary | ICD-10-CM | POA: Diagnosis not present

## 2024-06-12 DIAGNOSIS — Z5111 Encounter for antineoplastic chemotherapy: Secondary | ICD-10-CM | POA: Insufficient documentation

## 2024-06-12 DIAGNOSIS — M542 Cervicalgia: Secondary | ICD-10-CM | POA: Diagnosis not present

## 2024-06-12 DIAGNOSIS — C180 Malignant neoplasm of cecum: Secondary | ICD-10-CM | POA: Insufficient documentation

## 2024-06-12 DIAGNOSIS — M25512 Pain in left shoulder: Secondary | ICD-10-CM | POA: Diagnosis not present

## 2024-06-12 DIAGNOSIS — R918 Other nonspecific abnormal finding of lung field: Secondary | ICD-10-CM | POA: Diagnosis not present

## 2024-06-12 DIAGNOSIS — Z803 Family history of malignant neoplasm of breast: Secondary | ICD-10-CM | POA: Insufficient documentation

## 2024-06-12 LAB — CMP (CANCER CENTER ONLY)
ALT: 18 U/L (ref 0–44)
AST: 26 U/L (ref 15–41)
Albumin: 4.5 g/dL (ref 3.5–5.0)
Alkaline Phosphatase: 134 U/L — ABNORMAL HIGH (ref 38–126)
Anion gap: 16 — ABNORMAL HIGH (ref 5–15)
BUN: 9 mg/dL (ref 6–20)
CO2: 23 mmol/L (ref 22–32)
Calcium: 10.1 mg/dL (ref 8.9–10.3)
Chloride: 99 mmol/L (ref 98–111)
Creatinine: 0.79 mg/dL (ref 0.44–1.00)
GFR, Estimated: >60 mL/min (ref >60)
Glucose, Bld: 144 mg/dL — ABNORMAL HIGH (ref 70–99)
Potassium: 4.1 mmol/L (ref 3.5–5.1)
Sodium: 137 mmol/L (ref 135–145)
Total Bilirubin: <0.2 mg/dL (ref 0.0–1.2)
Total Protein: 7.7 g/dL (ref 6.5–8.1)

## 2024-06-12 LAB — CBC WITH DIFFERENTIAL (CANCER CENTER ONLY)
Abs Immature Granulocytes: 0 10*3/uL (ref 0.00–0.07)
Basophils Absolute: 0.1 10*3/uL (ref 0.0–0.1)
Basophils Relative: 1 %
Eosinophils Absolute: 1.4 10*3/uL — ABNORMAL HIGH (ref 0.0–0.5)
Eosinophils Relative: 20 %
HCT: 36.8 % (ref 36.0–46.0)
Hemoglobin: 12.1 g/dL (ref 12.0–15.0)
Immature Granulocytes: 0 %
Lymphocytes Relative: 33 %
Lymphs Abs: 2.3 10*3/uL (ref 0.7–4.0)
MCH: 29.2 pg (ref 26.0–34.0)
MCHC: 32.9 g/dL (ref 30.0–36.0)
MCV: 88.9 fL (ref 80.0–100.0)
Monocytes Absolute: 0.6 10*3/uL (ref 0.1–1.0)
Monocytes Relative: 9 %
Neutro Abs: 2.5 10*3/uL (ref 1.7–7.7)
Neutrophils Relative %: 37 %
Platelet Count: 328 10*3/uL (ref 150–400)
RBC: 4.14 MIL/uL (ref 3.87–5.11)
RDW: 16.4 % — ABNORMAL HIGH (ref 11.5–15.5)
WBC Count: 6.8 10*3/uL (ref 4.0–10.5)
nRBC: 0 % (ref 0.0–0.2)

## 2024-06-12 LAB — CEA (ACCESS): CEA (CHCC): 24.92 ng/mL — ABNORMAL HIGH (ref 0.00–5.00)

## 2024-06-12 MED ORDER — ACETAMINOPHEN 325 MG PO TABS
650.0000 mg | ORAL_TABLET | Freq: Once | ORAL | Status: AC
  Administered 2024-06-12: 650 mg via ORAL
  Filled 2024-06-12: qty 2

## 2024-06-12 MED ORDER — DEXAMETHASONE SODIUM PHOSPHATE 10 MG/ML IJ SOLN
10.0000 mg | Freq: Once | INTRAMUSCULAR | Status: AC
  Administered 2024-06-12: 10 mg via INTRAVENOUS
  Filled 2024-06-12: qty 1

## 2024-06-12 MED ORDER — SODIUM CHLORIDE 0.9% FLUSH: 10.0000 mL | INTRAVENOUS | Status: DC | PRN

## 2024-06-12 MED ORDER — SODIUM CHLORIDE 0.9 % IV SOLN
2400.0000 mg/m2 | INTRAVENOUS | Status: DC
  Administered 2024-06-12: 5600 mg via INTRAVENOUS
  Filled 2024-06-12: qty 112

## 2024-06-12 MED ORDER — HEPARIN SOD (PORK) LOCK FLUSH 100 UNIT/ML IV SOLN
500.0000 [IU] | Freq: Once | INTRAVENOUS | Status: DC | PRN
Start: 2024-06-12 — End: 2024-06-12

## 2024-06-12 MED ORDER — ONDANSETRON HCL 8 MG PO TABS: 8.0000 mg | ORAL_TABLET | Freq: Three times a day (TID) | ORAL | 3 refills | Status: DC | PRN

## 2024-06-12 MED ORDER — PROCHLORPERAZINE MALEATE 10 MG PO TABS: 10.0000 mg | ORAL_TABLET | Freq: Four times a day (QID) | ORAL | 3 refills | Status: DC | PRN

## 2024-06-12 MED ORDER — OXALIPLATIN CHEMO INJECTION 100 MG/20ML
85.0000 mg/m2 | Freq: Once | INTRAVENOUS | Status: AC
  Administered 2024-06-12: 200 mg via INTRAVENOUS
  Filled 2024-06-12: qty 40

## 2024-06-12 MED ORDER — DEXTROSE 5 % IV SOLN: INTRAVENOUS | Status: DC

## 2024-06-12 MED ORDER — FLUOROURACIL CHEMO INJECTION 2.5 GM/50ML
400.0000 mg/m2 | Freq: Once | INTRAVENOUS | Status: AC
  Administered 2024-06-12: 1000 mg via INTRAVENOUS
  Filled 2024-06-12: qty 20

## 2024-06-12 MED ORDER — PALONOSETRON HCL INJECTION 0.25 MG/5ML
0.2500 mg | Freq: Once | INTRAVENOUS | Status: AC
  Administered 2024-06-12: 0.25 mg via INTRAVENOUS
  Filled 2024-06-12: qty 5

## 2024-06-12 MED ORDER — LEUCOVORIN CALCIUM INJECTION 350 MG
400.0000 mg/m2 | Freq: Once | INTRAVENOUS | Status: AC
  Administered 2024-06-12: 932 mg via INTRAVENOUS
  Filled 2024-06-12: qty 46.6

## 2024-06-12 NOTE — Progress Notes (Signed)
 [facility] OFFICE PROGRESS NOTE   Diagnosis: Colon cancer  INTERVAL HISTORY:   Erin Bradford returns as scheduled.  She is seen prior to beginning FOLFOX chemotherapy.  She reports the abdominal wound has healed.  Appetite is better.  She is gaining weight.  Bowels are moving.  No bleeding.  No nausea.  Objective:  Vital signs in last 24 hours:  Blood pressure 128/75, pulse 76, temperature 97.9 F (36.6 C), temperature source Temporal, resp. rate 18, height 5' 6 (1.676 m), weight 246 lb 3.2 oz (111.7 kg), last menstrual period 09/17/2017, SpO2 100%.    HEENT: White coating over tongue.  No buccal thrush. Resp: Lungs clear bilaterally. Cardio: Regular rate and rhythm. GI: Abdomen soft, tender at the left upper abdomen.  No hepatosplenomegaly.  Midline  wound appears healed. Vascular: No leg edema. Port-A-Cath without erythema.  Lab Results:  Lab Results  Component Value Date   WBC 6.8 06/12/2024   HGB 12.1 06/12/2024   HCT 36.8 06/12/2024   MCV 88.9 06/12/2024   PLT 328 06/12/2024   NEUTROABS 2.5 06/12/2024    Imaging:  No results found.  Medications: I have reviewed the patient's current medications.  Assessment/Plan: Colon cancer, cecum, stage IIIb (pT4a, PN1a), Ileocecectomy 04/12/2024-moderately differentiated adenocarcinoma of the cecum, no macroscopic tumor perforation, carcinoma invades into the serosal surface, no lymphovascular perineural invasion, negative resection margins, 1/3 nodes, 1 tumor deposit, normal mismatch repair protein expression, microsatellite stable CT abdomen/pelvis 04/11/2024: Dilated appendix with extensive periappendiceal inflammatory changes and a 5 cm fluid collection, no enlarged abdominal pelvic lymph nodes, unchanged 9 mm right adrenal nodule from 2018 CT chest 04/21/2024: 1.4 cm anterior mediastinal nodule or node, bilateral tiny nonspecific pulmonary nodules PET scan 06/08/2024-developing metastatic lesions right  hemiabdominal mesenteric lymph nodes as well as at least 2 liver metastases.  Anterior superior mediastinal soft tissue nodule is similar to the previous chest CT, no abnormal uptake. Cycle 1 FOLFOX 06/12/2024 Depression Urinary frequency Hypertension Tremors Tachycardia Asthma Family history of breast and colon cancer Colonoscopy-needs colonoscopy approximately 6 months from diagnosis  Disposition: Erin Bradford appears stable.  She is scheduled to begin treatment today with FOLFOX.  We again reviewed potential toxicities.  She agrees to proceed.  We reviewed the PET scan report/images with her and a family member today.  They understand the liver lesions indicate metastatic disease and no therapy will be curative.  She understands the goals of treatment are to improve the cancer and potentially extend lifespan.  We are referring her for MRI of the liver.  Foundation 1 testing has been requested.  Prescriptions sent to her pharmacy for Compazine  and Zofran .  She understands she should not begin Zofran  until 72 hours after chemotherapy.  CBC and chemistry panel from today reviewed.  Labs adequate for treatment.  She will return for follow-up and cycle 2 FOLFOX in 2 weeks.  We are available to see her sooner if needed.  Patient seen with Dr. Scherrie Curt.  Diana Forster ANP/GNP-BC   06/12/2024  9:12 AM  This was a shared visit with Diana Forster.  We reviewed the staging PET findings and images with Erin Bradford her aunt.  The PET is consistent with metastatic disease involving the liver and abdominal lymph nodes.  We discussed the implications of the PET findings.  She has stage IV colon cancer.  We will refer her for an MRI to confirm evidence of liver metastases.  The plan is to proceed with FOLFOX chemotherapy.  The resected  tumor will be submitted for NGS testing to see if she will qualified for a targeted therapy.  The plan is to proceed with cycle 1 FOLFOX today. I was present for  greater than 50% of today's visit.  I performed medical decision making.  Anise Kerns, MD

## 2024-06-12 NOTE — Telephone Encounter (Signed)
 Contacted patient to make aware MRI liver w, wo contrast will be on 06/19/24 at 0900 [facility]  Arrive at 0830, NPO four hours prior. Confirmed, no further questions.

## 2024-06-12 NOTE — Progress Notes (Signed)
 [facility] Psychosocial Distress Screening Clinical Social Work  Erin Bradford is a 53 y.o. year old female accompanied by family member. Clinical Social Work was referred by nurse for positive distress screening. The patient scored a 9 on the Psychosocial Distress Thermometer which indicates severe distress. Clinical Social Worker met with patient and family member in person in the infusion treatment area to assess for distress and other psychosocial needs.     Distress Screen:    06/12/2024   11:16 AM  ONCBCN DISTRESS SCREENING  Screening Type Other (comment)  How much distress have you been experiencing in the past week? (0-10) 9  Practical concerns type Taking care of myself;School;Finances;Insurance  Emotional concerns type Worry or anxiety;Loss of interest or enjoyment;Fear;Feelings of worthlessness or being a burden  Physical Concerns Type  Pain;Sleep;Fatigue   Patient attributed distress screening score to learning their cancer is metastatic and is receiving palliative treatment. Patient discussed the unexpectedness of the news and how it's impacting mood.  CSW provided emotional and spiritual support, discussing common emotions when diagnoses with cancer and learning the severity of the condition.  CSW validated patient's concerns and emotions discussed.   CSW discussed mental health support, patient has upcoming appointment with community based counselor. Patient understands she can reach out to CSW if she has concerns with provider.   CSW informed patient of the support team and support services at [facility]. CSW placed patient on list for [employer].   CSW provided contact information and encouraged patient to call with any questions or concerns.  Patient previously approved for the [employer] for financial assistance.   Follow Up Plan: Patient will contact CSW with any support or resource needs and CSW will follow-up with patient by phone   Patient verbalizes understanding of plan: Yes  Maudie Sorrow, LCSW

## 2024-06-12 NOTE — Progress Notes (Signed)
 Foundation one testing requested on [id number]

## 2024-06-12 NOTE — Progress Notes (Signed)
 Patient seen by Diana Forster NP today  Vitals are within treatment parameters:Yes  Increased 4 pounds Labs are within treatment parameters: Yes   Treatment plan has been signed: Yes   Per physician team, Patient is ready for treatment and there are NO modifications to the treatment plan. 1st time treatment

## 2024-06-12 NOTE — Patient Instructions (Addendum)
 [facility] - A DEPT OF [facility]  Discharge Instructions: Thank you for choosing [facility] to provide your oncology and hematology care.   If you have a lab appointment with the Cancer Center, please go directly to the Cancer Center and check in at the registration area.   Wear comfortable clothing and clothing appropriate for easy access to any Portacath or PICC line.   We strive to give you quality time with your provider. You may need to reschedule your appointment if you arrive late (15 or more minutes).  Arriving late affects you and other patients whose appointments are after yours.  Also, if you miss three or more appointments without notifying the office, you may be dismissed from the clinic at the provider's discretion.      For prescription refill requests, have your pharmacy contact our office and allow 72 hours for refills to be completed.    Today you received the following chemotherapy and/or immunotherapy agents oxaliplatin, leucovorin, fluoruracil.      To help prevent nausea and vomiting after your treatment, we encourage you to take your nausea medication as directed.  BELOW ARE SYMPTOMS THAT SHOULD BE REPORTED IMMEDIATELY: *FEVER GREATER THAN 100.4 F (38 C) OR HIGHER *CHILLS OR SWEATING *NAUSEA AND VOMITING THAT IS NOT CONTROLLED WITH YOUR NAUSEA MEDICATION *UNUSUAL SHORTNESS OF BREATH *UNUSUAL BRUISING OR BLEEDING *URINARY PROBLEMS (pain or burning when urinating, or frequent urination) *BOWEL PROBLEMS (unusual diarrhea, constipation, pain near the anus) TENDERNESS IN MOUTH AND THROAT WITH OR WITHOUT PRESENCE OF ULCERS (sore throat, sores in mouth, or a toothache) UNUSUAL RASH, SWELLING OR PAIN  UNUSUAL VAGINAL DISCHARGE OR ITCHING   Items with * indicate a potential emergency and should be followed up as soon as possible or go to the Emergency Department if any problems should occur.  Please show the CHEMOTHERAPY  ALERT CARD or IMMUNOTHERAPY ALERT CARD at check-in to the Emergency Department and triage nurse.  Should you have questions after your visit or need to cancel or reschedule your appointment, please contact [facility] - A DEPT OF [facility]  Dept: [phone]  and follow the prompts.  Office hours are 8:00 a.m. to 4:30 p.m. Monday - Friday. Please note that voicemails left after 4:00 p.m. may not be returned until the following business day.  We are closed weekends and major holidays. You have access to a nurse at all times for urgent questions. Please call the main number to the clinic Dept: [phone] and follow the prompts.   For any non-urgent questions, you may also contact your provider using MyChart. We now offer e-Visits for anyone 59 and older to request care online for non-urgent symptoms. For details visit [email or web address].   Also download the MyChart app! Go to the app store, search MyChart, open the app, select [place], and log in with your MyChart username and password.  Oxaliplatin Injection What is this medication? OXALIPLATIN (ox AL i PLA tin) treats colorectal cancer. It works by slowing down the growth of cancer cells. This medicine may be used for other purposes; ask your health care provider or pharmacist if you have questions. COMMON BRAND NAME(S): Eloxatin What should I tell my care team before I take this medication? They need to know if you have any of these conditions: Heart disease History of irregular heartbeat or rhythm Liver disease Low blood cell levels (white cells, red cells, and platelets) Lung or breathing  disease, such as asthma Take medications that treat or prevent blood clots Tingling of the fingers, toes, or other nerve disorder An unusual or allergic reaction to oxaliplatin, other medications, foods, dyes, or preservatives If you or your partner are pregnant or trying to get pregnant Breast-feeding How  should I use this medication? This medication is injected into a vein. It is given by your care team in a hospital or clinic setting. Talk to your care team about the use of this medication in children. Special care may be needed. Overdosage: If you think you have taken too much of this medicine contact a poison control center or emergency room at once. NOTE: This medicine is only for you. Do not share this medicine with others. What if I miss a dose? Keep appointments for follow-up doses. It is important not to miss a dose. Call your care team if you are unable to keep an appointment. What may interact with this medication? Do not take this medication with any of the following: Cisapride Dronedarone Pimozide Thioridazine This medication may also interact with the following: Aspirin and aspirin-like medications Certain medications that treat or prevent blood clots, such as warfarin, apixaban, dabigatran, and rivaroxaban Cisplatin Cyclosporine Diuretics Medications for infection, such as acyclovir, adefovir, amphotericin B, bacitracin, cidofovir, foscarnet, ganciclovir, gentamicin, pentamidine, vancomycin  NSAIDs, medications for pain and inflammation, such as ibuprofen  or naproxen Other medications that cause heart rhythm changes Pamidronate Zoledronic acid This list may not describe all possible interactions. Give your health care provider a list of all the medicines, herbs, non-prescription drugs, or dietary supplements you use. Also tell them if you smoke, drink alcohol , or use illegal drugs. Some items may interact with your medicine. What should I watch for while using this medication? Your condition will be monitored carefully while you are receiving this medication. You may need blood work while taking this medication. This medication may make you feel generally unwell. This is not uncommon as chemotherapy can affect healthy cells as well as cancer cells. Report any side effects.  Continue your course of treatment even though you feel ill unless your care team tells you to stop. This medication may increase your risk of getting an infection. Call your care team for advice if you get a fever, chills, sore throat, or other symptoms of a cold or flu. Do not treat yourself. Try to avoid being around people who are sick. Avoid taking medications that contain aspirin, acetaminophen , ibuprofen , naproxen, or ketoprofen unless instructed by your care team. These medications may hide a fever. Be careful brushing or flossing your teeth or using a toothpick because you may get an infection or bleed more easily. If you have any dental work done, tell your dentist you are receiving this medication. This medication can make you more sensitive to cold. Do not drink cold drinks or use ice. Cover exposed skin before coming in contact with cold temperatures or cold objects. When out in cold weather wear warm clothing and cover your mouth and nose to warm the air that goes into your lungs. Tell your care team if you get sensitive to the cold. Talk to your care team if you or your partner are pregnant or think either of you might be pregnant. This medication can cause serious birth defects if taken during pregnancy and for 9 months after the last dose. A negative pregnancy test is required before starting this medication. A reliable form of contraception is recommended while taking this medication and for 9 months  after the last dose. Talk to your care team about effective forms of contraception. Do not father a child while taking this medication and for 6 months after the last dose. Use a condom while having sex during this time period. Do not breastfeed while taking this medication and for 3 months after the last dose. This medication may cause infertility. Talk to your care team if you are concerned about your fertility. What side effects may I notice from receiving this medication? Side effects that  you should report to your care team as soon as possible: Allergic reactions--skin rash, itching, hives, swelling of the face, lips, tongue, or throat Bleeding--bloody or black, tar-like stools, vomiting blood or brown material that looks like coffee grounds, red or dark brown urine, small red or purple spots on skin, unusual bruising or bleeding Dry cough, shortness of breath or trouble breathing Heart rhythm changes--fast or irregular heartbeat, dizziness, feeling faint or lightheaded, chest pain, trouble breathing Infection--fever, chills, cough, sore throat, wounds that don't heal, pain or trouble when passing urine, general feeling of discomfort or being unwell Liver injury--right upper belly pain, loss of appetite, nausea, light-colored stool, dark yellow or brown urine, yellowing skin or eyes, unusual weakness or fatigue Low red blood cell level--unusual weakness or fatigue, dizziness, headache, trouble breathing Muscle injury--unusual weakness or fatigue, muscle pain, dark yellow or brown urine, decrease in amount of urine Pain, tingling, or numbness in the hands or feet Sudden and severe headache, confusion, change in vision, seizures, which may be signs of posterior reversible encephalopathy syndrome (PRES) Unusual bruising or bleeding Side effects that usually do not require medical attention (report to your care team if they continue or are bothersome): Diarrhea Nausea Pain, redness, or swelling with sores inside the mouth or throat Unusual weakness or fatigue Vomiting This list may not describe all possible side effects. Call your doctor for medical advice about side effects. You may report side effects to FDA at 1-800-FDA-1088. Where should I keep my medication? This medication is given in a hospital or clinic. It will not be stored at home. NOTE: This sheet is a summary. It may not cover all possible information. If you have questions about this medicine, talk to your doctor,  pharmacist, or health care provider.  2024 Elsevier/Gold Standard (2023-11-25 00:00:00)  Leucovorin Injection What is this medication? LEUCOVORIN (loo koe VOR in) prevents side effects from certain medications, such as methotrexate. It works by increasing folate levels. This helps protect healthy cells in your body. It may also be used to treat anemia caused by low levels of folate. It can also be used with fluorouracil, a type of chemotherapy, to treat colorectal cancer. It works by increasing the effects of fluorouracil in the body. This medicine may be used for other purposes; ask your health care provider or pharmacist if you have questions. What should I tell my care team before I take this medication? They need to know if you have any of these conditions: Anemia from low levels of vitamin B12 in the blood An unusual or allergic reaction to leucovorin, folic acid , other medications, foods, dyes, or preservatives Pregnant or trying to get pregnant Breastfeeding How should I use this medication? This medication is injected into a vein or a muscle. It is given by your care team in a hospital or clinic setting. Talk to your care team about the use of this medication in children. Special care may be needed. Overdosage: If you think you have taken too much  of this medicine contact a poison control center or emergency room at once. NOTE: This medicine is only for you. Do not share this medicine with others. What if I miss a dose? Keep appointments for follow-up doses. It is important not to miss your dose. Call your care team if you are unable to keep an appointment. What may interact with this medication? Capecitabine Fluorouracil Phenobarbital Phenytoin Primidone  Trimethoprim;sulfamethoxazole This list may not describe all possible interactions. Give your health care provider a list of all the medicines, herbs, non-prescription drugs, or dietary supplements you use. Also tell them if you  smoke, drink alcohol , or use illegal drugs. Some items may interact with your medicine. What should I watch for while using this medication? Your condition will be monitored carefully while you are receiving this medication. This medication may increase the side effects of 5-fluorouracil. Tell your care team if you have diarrhea or mouth sores that do not get better or that get worse. What side effects may I notice from receiving this medication? Side effects that you should report to your care team as soon as possible: Allergic reactions--skin rash, itching, hives, swelling of the face, lips, tongue, or throat This list may not describe all possible side effects. Call your doctor for medical advice about side effects. You may report side effects to FDA at 1-800-FDA-1088. Where should I keep my medication? This medication is given in a hospital or clinic. It will not be stored at home. NOTE: This sheet is a summary. It may not cover all possible information. If you have questions about this medicine, talk to your doctor, pharmacist, or health care provider.  2024 Elsevier/Gold Standard (2022-05-18 00:00:00)  Fluorouracil Injection What is this medication? FLUOROURACIL (flure oh YOOR a sil) treats some types of cancer. It works by slowing down the growth of cancer cells. This medicine may be used for other purposes; ask your health care provider or pharmacist if you have questions. COMMON BRAND NAME(S): Adrucil What should I tell my care team before I take this medication? They need to know if you have any of these conditions: Blood disorders Dihydropyrimidine dehydrogenase (DPD) deficiency Infection, such as chickenpox, cold sores, herpes Kidney disease Liver disease Poor nutrition Recent or ongoing radiation therapy An unusual or allergic reaction to fluorouracil, other medications, foods, dyes, or preservatives If you or your partner are pregnant or trying to get  pregnant Breast-feeding How should I use this medication? This medication is injected into a vein. It is administered by your care team in a hospital or clinic setting. Talk to your care team about the use of this medication in children. Special care may be needed. Overdosage: If you think you have taken too much of this medicine contact a poison control center or emergency room at once. NOTE: This medicine is only for you. Do not share this medicine with others. What if I miss a dose? Keep appointments for follow-up doses. It is important not to miss your dose. Call your care team if you are unable to keep an appointment. What may interact with this medication? Do not take this medication with any of the following: Live virus vaccines This medication may also interact with the following: Medications that treat or prevent blood clots, such as warfarin, enoxaparin , dalteparin This list may not describe all possible interactions. Give your health care provider a list of all the medicines, herbs, non-prescription drugs, or dietary supplements you use. Also tell them if you smoke, drink alcohol , or use  illegal drugs. Some items may interact with your medicine. What should I watch for while using this medication? Your condition will be monitored carefully while you are receiving this medication. This medication may make you feel generally unwell. This is not uncommon as chemotherapy can affect healthy cells as well as cancer cells. Report any side effects. Continue your course of treatment even though you feel ill unless your care team tells you to stop. In some cases, you may be given additional medications to help with side effects. Follow all directions for their use. This medication may increase your risk of getting an infection. Call your care team for advice if you get a fever, chills, sore throat, or other symptoms of a cold or flu. Do not treat yourself. Try to avoid being around people who are  sick. This medication may increase your risk to bruise or bleed. Call your care team if you notice any unusual bleeding. Be careful brushing or flossing your teeth or using a toothpick because you may get an infection or bleed more easily. If you have any dental work done, tell your dentist you are receiving this medication. Avoid taking medications that contain aspirin, acetaminophen , ibuprofen , naproxen, or ketoprofen unless instructed by your care team. These medications may hide a fever. Do not treat diarrhea with over the counter products. Contact your care team if you have diarrhea that lasts more than 2 days or if it is severe and watery. This medication can make you more sensitive to the sun. Keep out of the sun. If you cannot avoid being in the sun, wear protective clothing and sunscreen. Do not use sun lamps, tanning beds, or tanning booths. Talk to your care team if you or your partner wish to become pregnant or think you might be pregnant. This medication can cause serious birth defects if taken during pregnancy and for 3 months after the last dose. A reliable form of contraception is recommended while taking this medication and for 3 months after the last dose. Talk to your care team about effective forms of contraception. Do not father a child while taking this medication and for 3 months after the last dose. Use a condom while having sex during this time period. Do not breastfeed while taking this medication. This medication may cause infertility. Talk to your care team if you are concerned about your fertility. What side effects may I notice from receiving this medication? Side effects that you should report to your care team as soon as possible: Allergic reactions--skin rash, itching, hives, swelling of the face, lips, tongue, or throat Heart attack--pain or tightness in the chest, shoulders, arms, or jaw, nausea, shortness of breath, cold or clammy skin, feeling faint or  lightheaded Heart failure--shortness of breath, swelling of the ankles, feet, or hands, sudden weight gain, unusual weakness or fatigue Heart rhythm changes--fast or irregular heartbeat, dizziness, feeling faint or lightheaded, chest pain, trouble breathing High ammonia level--unusual weakness or fatigue, confusion, loss of appetite, nausea, vomiting, seizures Infection--fever, chills, cough, sore throat, wounds that don't heal, pain or trouble when passing urine, general feeling of discomfort or being unwell Low red blood cell level--unusual weakness or fatigue, dizziness, headache, trouble breathing Pain, tingling, or numbness in the hands or feet, muscle weakness, change in vision, confusion or trouble speaking, loss of balance or coordination, trouble walking, seizures Redness, swelling, and blistering of the skin over hands and feet Severe or prolonged diarrhea Unusual bruising or bleeding Side effects that usually do not require  medical attention (report to your care team if they continue or are bothersome): Dry skin Headache Increased tears Nausea Pain, redness, or swelling with sores inside the mouth or throat Sensitivity to light Vomiting This list may not describe all possible side effects. Call your doctor for medical advice about side effects. You may report side effects to FDA at 1-800-FDA-1088. Where should I keep my medication? This medication is given in a hospital or clinic. It will not be stored at home. NOTE: This sheet is a summary. It may not cover all possible information. If you have questions about this medicine, talk to your doctor, pharmacist, or health care provider.  2024 Elsevier/Gold Standard (2022-04-20 00:00:00)

## 2024-06-13 ENCOUNTER — Telehealth: Payer: Self-pay | Admitting: *Deleted

## 2024-06-13 NOTE — Telephone Encounter (Signed)
 Alanee had left VM late 6/17 reporting cramping in toes and voice is hoarse (1st FOLFOX 6/17). Called her back today to discuss. The cramping resolved and voice is better as well. Confirmed with her that this is potential side effect of the Oxaliplatin. If toes cramp, warm them up. Voice should return to normal soon. She denies any nausea, getting ready to eat breakfast. Feels a bit constipated. Encouraged her to take MiraLax  and start colace bid and that constipation is common for a few days w/this regimen as well. She had no other complaints or questions.

## 2024-06-14 ENCOUNTER — Inpatient Hospital Stay

## 2024-06-14 VITALS — BP 127/90 | HR 87 | Temp 98.7°F | Resp 18

## 2024-06-14 DIAGNOSIS — C182 Malignant neoplasm of ascending colon: Secondary | ICD-10-CM

## 2024-06-14 DIAGNOSIS — Z5111 Encounter for antineoplastic chemotherapy: Secondary | ICD-10-CM | POA: Diagnosis not present

## 2024-06-14 MED ORDER — HEPARIN SOD (PORK) LOCK FLUSH 100 UNIT/ML IV SOLN
500.0000 [IU] | Freq: Once | INTRAVENOUS | Status: AC | PRN
  Administered 2024-06-14: 500 [IU]

## 2024-06-14 MED ORDER — SODIUM CHLORIDE 0.9% FLUSH
10.0000 mL | INTRAVENOUS | Status: DC | PRN
  Administered 2024-06-14: 10 mL

## 2024-06-14 NOTE — Patient Instructions (Signed)

## 2024-06-18 ENCOUNTER — Other Ambulatory Visit: Payer: Self-pay | Admitting: Nurse Practitioner

## 2024-06-18 ENCOUNTER — Encounter: Payer: Self-pay | Admitting: Nurse Practitioner

## 2024-06-18 ENCOUNTER — Telehealth: Payer: Self-pay | Admitting: *Deleted

## 2024-06-18 ENCOUNTER — Inpatient Hospital Stay

## 2024-06-18 ENCOUNTER — Ambulatory Visit (HOSPITAL_BASED_OUTPATIENT_CLINIC_OR_DEPARTMENT_OTHER)
Admission: RE | Admit: 2024-06-18 | Discharge: 2024-06-18 | Disposition: A | Discharge status: Home / Self Care | Source: Ambulatory Visit | Attending: Nurse Practitioner | Admitting: Nurse Practitioner

## 2024-06-18 ENCOUNTER — Inpatient Hospital Stay (HOSPITAL_BASED_OUTPATIENT_CLINIC_OR_DEPARTMENT_OTHER): Admitting: Nurse Practitioner

## 2024-06-18 ENCOUNTER — Telehealth: Payer: Self-pay

## 2024-06-18 VITALS — BP 121/90 | HR 69 | Temp 98.1°F | Resp 18 | Ht 66.0 in | Wt 240.9 lb

## 2024-06-18 DIAGNOSIS — Z5111 Encounter for antineoplastic chemotherapy: Secondary | ICD-10-CM | POA: Diagnosis not present

## 2024-06-18 DIAGNOSIS — Z803 Family history of malignant neoplasm of breast: Secondary | ICD-10-CM

## 2024-06-18 DIAGNOSIS — C182 Malignant neoplasm of ascending colon: Secondary | ICD-10-CM

## 2024-06-18 DIAGNOSIS — Z800 Family history of malignant neoplasm of digestive organs: Secondary | ICD-10-CM | POA: Diagnosis not present

## 2024-06-18 DIAGNOSIS — R918 Other nonspecific abnormal finding of lung field: Secondary | ICD-10-CM

## 2024-06-18 DIAGNOSIS — M542 Cervicalgia: Secondary | ICD-10-CM | POA: Diagnosis not present

## 2024-06-18 DIAGNOSIS — C180 Malignant neoplasm of cecum: Secondary | ICD-10-CM | POA: Diagnosis not present

## 2024-06-18 DIAGNOSIS — M79602 Pain in left arm: Secondary | ICD-10-CM

## 2024-06-18 LAB — CBC WITH DIFFERENTIAL (CANCER CENTER ONLY)
Abs Immature Granulocytes: 0.01 10*3/uL (ref 0.00–0.07)
Basophils Absolute: 0.1 10*3/uL (ref 0.0–0.1)
Basophils Relative: 1 %
Eosinophils Absolute: 0.3 10*3/uL (ref 0.0–0.5)
Eosinophils Relative: 6 %
HCT: 37.1 % (ref 36.0–46.0)
Hemoglobin: 12 g/dL (ref 12.0–15.0)
Immature Granulocytes: 0 %
Lymphocytes Relative: 56 %
Lymphs Abs: 3.1 10*3/uL (ref 0.7–4.0)
MCH: 28.3 pg (ref 26.0–34.0)
MCHC: 32.3 g/dL (ref 30.0–36.0)
MCV: 87.5 fL (ref 80.0–100.0)
Monocytes Absolute: 0.3 10*3/uL (ref 0.1–1.0)
Monocytes Relative: 5 %
Neutro Abs: 1.8 10*3/uL (ref 1.7–7.7)
Neutrophils Relative %: 32 %
Platelet Count: 376 10*3/uL (ref 150–400)
RBC: 4.24 MIL/uL (ref 3.87–5.11)
RDW: 15.4 % (ref 11.5–15.5)
WBC Count: 5.5 10*3/uL (ref 4.0–10.5)
nRBC: 0 % (ref 0.0–0.2)

## 2024-06-18 LAB — CMP (CANCER CENTER ONLY)
ALT: 14 U/L (ref 0–44)
AST: 23 U/L (ref 15–41)
Albumin: 4.4 g/dL (ref 3.5–5.0)
Alkaline Phosphatase: 124 U/L (ref 38–126)
Anion gap: 13 (ref 5–15)
BUN: 7 mg/dL (ref 6–20)
CO2: 24 mmol/L (ref 22–32)
Calcium: 9.6 mg/dL (ref 8.9–10.3)
Chloride: 100 mmol/L (ref 98–111)
Creatinine: 0.86 mg/dL (ref 0.44–1.00)
GFR, Estimated: >60 mL/min (ref >60)
Glucose, Bld: 114 mg/dL — ABNORMAL HIGH (ref 70–99)
Potassium: 3.9 mmol/L (ref 3.5–5.1)
Sodium: 136 mmol/L (ref 135–145)
Total Bilirubin: <0.2 mg/dL (ref 0.0–1.2)
Total Protein: 7.4 g/dL (ref 6.5–8.1)

## 2024-06-18 LAB — URINALYSIS, COMPLETE (UACMP) WITH MICROSCOPIC
Bacteria, UA: NONE SEEN
Bilirubin Urine: NEGATIVE
Glucose, UA: NEGATIVE mg/dL
Hgb urine dipstick: NEGATIVE
Leukocytes,Ua: NEGATIVE
Nitrite: NEGATIVE
Specific Gravity, Urine: 1.025 (ref 1.005–1.030)
pH: 5.5 (ref 5.0–8.0)

## 2024-06-18 NOTE — Progress Notes (Signed)
 [facility] OFFICE PROGRESS NOTE   Diagnosis: Colon cancer  INTERVAL HISTORY:   Erin Bradford returns prior to scheduled follow-up.  She completed cycle 1 FOLFOX 06/12/2024.  She contacted the office this morning requesting evaluation of pain and swelling at the left neck/shoulder near the Port-A-Cath.  Erin Bradford reports pain and swelling at the left neck/shoulder/arm for the past 2 to 3 days.  She describes the pain as throbbing.  She notes a hyperpigmented rash around the anterior neck.  No associated pruritus.  No fever.  She has a sore throat.  No mouth sores.  No dysphagia.  She had mild nausea after the chemo, no vomiting.  No diarrhea.  Stools are soft.  No shortness of breath.  The pain and swelling seem to be improving.  Objective:  Vital signs in last 24 hours:  Blood pressure (!) 121/90, pulse 69, temperature 98.1 F (36.7 C), temperature source Oral, resp. rate 18, height 5' 6 (1.676 m), weight 240 lb 14.4 oz (109.3 kg), last menstrual period 09/17/2017, SpO2 97%.    HEENT: No thrush or ulcers. Resp: Lungs clear bilaterally. Cardio: Regular rate and rhythm. GI: No hepatosplenomegaly. Vascular: No leg edema.  Entirety of the left arm has a full appearance.  No vein engorgement.  Left supraclavicular fossa fullness with associated tenderness extending to the left posterior neck. Skin: Hyperpigmented rash anterior neck.  No blister appearing lesions.  No skin breakdown.  No erythema at the chest, neck, left arm. Port-A-Cath without erythema.  Lab Results:  Lab Results  Component Value Date   WBC 6.8 06/12/2024   HGB 12.1 06/12/2024   HCT 36.8 06/12/2024   MCV 88.9 06/12/2024   PLT 328 06/12/2024   NEUTROABS 2.5 06/12/2024    Imaging:  No results found.  Medications: I have reviewed the patient's current medications.  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, no macroscopic tumor perforation, carcinoma invades into the serosal surface, no lymphovascular perineural invasion, negative resection margins, 1/3 nodes, 1 tumor deposit, normal mismatch repair protein expression, microsatellite stable CT abdomen/pelvis 04/11/2024: Dilated appendix with extensive periappendiceal inflammatory changes and a 5 cm fluid collection, no enlarged abdominal pelvic lymph nodes, unchanged 9 mm right adrenal nodule from 2018 CT chest 04/21/2024: 1.4 cm anterior mediastinal nodule or node, bilateral tiny nonspecific pulmonary nodules PET scan 06/08/2024-developing metastatic lesions right hemiabdominal mesenteric lymph nodes as well as at least 2 liver metastases.  Anterior superior mediastinal soft tissue nodule is similar to the previous chest CT, no abnormal uptake. Cycle 1 FOLFOX 06/12/2024 Depression Urinary frequency Hypertension Tremors Tachycardia Asthma Family history of breast and colon cancer Colonoscopy-needs colonoscopy approximately 6 months from diagnosis  Disposition: Erin Bradford appears stable.  She completed cycle 1 FOLFOX beginning 06/12/2024.  She presents today with a several day history of left neck pain and swelling.  Venous Doppler study returned negative for DVT.  We are referring her for CTs neck and chest for further evaluation.  We discussed the possibility of chemotherapy extravasation.  Her symptoms are improving.  Plan to continue to monitor for now.  She understands to contact the office if symptoms worsen or she develops new symptoms.  We will contact her once CT results are available.  Patient seen with Dr. Cloretta.    Olam Ned ANP/GNP-BC   06/18/2024  12:30 PM  This was a shared visit with Olam Ned.  Erin Bradford was interviewed and examined.  She  is now at day 7 following cycle 1 FOLFOX.  She presents with left neck, arm, and chest wall swelling.  She has mild hyperpigmentation at the upper  chest/base of the neck.  A left upper extremity Doppler is negative.  We are referring her for a CT evaluation to look for evidence of an extravasation or other cause of the edema.  She will call for new symptoms.  I was present for greater than 50% of today's visit.  I performed medical decision making.  Arvella Hof, MD

## 2024-06-18 NOTE — Telephone Encounter (Addendum)
 Called to report that since 6/21 her left neck/shoulder at side of port is painful (throbbing) and swelling. Not red, port slightly tender to touch due to being new still. Percocet not helping. Voice is still hoarse and throat hurts. She has lost bladder control-when she gets up to void she is incontinent (wearing Depends)-no burning and urine looks normal. Mild nausea and bowels OK. Also adds that the rash she had on chest, neck,back,shoulder and neck is getting darker, but at neck it has lost pigment. Asking to be seen today. Per Dr. Cloretta: Needs to be worked in for lab/flush/OV w/NP. High priority scheduling message sent.

## 2024-06-18 NOTE — Telephone Encounter (Signed)
 I contacted CT department to see if they work her in after  her MRI. They was not sure if the could but she wanted MRI department to let them know when they complete the MRI. To see if the could working her in when she finish. Then I contacted the MRI and spoke with Consuelo and inform her the patient will need a ct scan if she would call ct dept to let them know she has competed the MRI so they can work her in.

## 2024-06-19 ENCOUNTER — Telehealth: Payer: Self-pay

## 2024-06-19 ENCOUNTER — Encounter (HOSPITAL_COMMUNITY): Payer: Self-pay | Admitting: Oncology

## 2024-06-19 ENCOUNTER — Ambulatory Visit (HOSPITAL_COMMUNITY)
Admission: RE | Admit: 2024-06-19 | Discharge: 2024-06-19 | Disposition: A | Discharge status: Home / Self Care | Source: Ambulatory Visit | Attending: Nurse Practitioner | Admitting: Nurse Practitioner

## 2024-06-19 DIAGNOSIS — C182 Malignant neoplasm of ascending colon: Secondary | ICD-10-CM | POA: Insufficient documentation

## 2024-06-19 LAB — URINE CULTURE: Culture: NO GROWTH

## 2024-06-19 MED ORDER — HEPARIN SOD (PORK) LOCK FLUSH 100 UNIT/ML IV SOLN
INTRAVENOUS | Status: AC
  Filled 2024-06-19: qty 5

## 2024-06-19 MED ORDER — HEPARIN SOD (PORK) LOCK FLUSH 100 UNIT/ML IV SOLN
500.0000 [IU] | Freq: Once | INTRAVENOUS | Status: AC
  Administered 2024-06-19: 500 [IU] via INTRAVENOUS

## 2024-06-19 MED ORDER — GADOBUTROL 1 MMOL/ML IV SOLN
10.0000 mL | Freq: Once | INTRAVENOUS | Status: AC | PRN
  Administered 2024-06-19: 10 mL via INTRAVENOUS

## 2024-06-19 NOTE — Telephone Encounter (Signed)
 I contacted the patient to follow up. She reported experiencing fatigue and some discomfort at her stomach wound. She mentioned that she plans to reach out to her surgeon regarding her concerns. The patient did not undergo a CT scan today; instead, it has been scheduled for Thursday, and she is aware of the appointment. She also noted that the knot on her shoulder appears to be enlarging, although there are no open areas or signs of infection. I advised her to contact us  if she experiences any additional concerns.

## 2024-06-20 ENCOUNTER — Encounter (HOSPITAL_COMMUNITY): Payer: Self-pay | Admitting: Oncology

## 2024-06-20 ENCOUNTER — Telehealth: Payer: Self-pay | Admitting: *Deleted

## 2024-06-20 ENCOUNTER — Other Ambulatory Visit: Payer: Self-pay | Admitting: Neurology

## 2024-06-20 NOTE — Telephone Encounter (Signed)
 Called to report her daughter from [place]  is coming down for next treatment cycle to stay with her and she has emailed her FMLA form to have completed. Wants to email to nurse. Suggested she bring the FMLA form with her tomorrow when she comes for her CT scan and she will need to sign her ROI as well. Made her aware that turn around time is 7-10 business days.

## 2024-06-21 ENCOUNTER — Ambulatory Visit (HOSPITAL_BASED_OUTPATIENT_CLINIC_OR_DEPARTMENT_OTHER)
Admission: RE | Admit: 2024-06-21 | Discharge: 2024-06-21 | Disposition: A | Discharge status: Home / Self Care | Source: Ambulatory Visit | Attending: Nurse Practitioner | Admitting: Nurse Practitioner

## 2024-06-21 DIAGNOSIS — C182 Malignant neoplasm of ascending colon: Secondary | ICD-10-CM

## 2024-06-21 DIAGNOSIS — M79602 Pain in left arm: Secondary | ICD-10-CM

## 2024-06-21 MED ORDER — IOHEXOL 300 MG/ML  SOLN
100.0000 mL | Freq: Once | INTRAMUSCULAR | Status: AC | PRN
Start: 2024-06-21 — End: 2024-06-21
  Administered 2024-06-21: 100 mL via INTRAVENOUS

## 2024-06-21 MED ORDER — HEPARIN SOD (PORK) LOCK FLUSH 100 UNIT/ML IV SOLN
500.0000 [IU] | Freq: Once | INTRAVENOUS | Status: AC
Start: 2024-06-21 — End: 2024-06-21
  Administered 2024-06-21: 500 [IU] via INTRAVENOUS

## 2024-06-21 NOTE — Telephone Encounter (Signed)
 LMOVM please keep appt to get further refills.

## 2024-06-22 ENCOUNTER — Encounter

## 2024-06-24 ENCOUNTER — Other Ambulatory Visit: Payer: Self-pay | Admitting: Oncology

## 2024-06-25 ENCOUNTER — Inpatient Hospital Stay

## 2024-06-25 ENCOUNTER — Encounter

## 2024-06-25 ENCOUNTER — Inpatient Hospital Stay (HOSPITAL_BASED_OUTPATIENT_CLINIC_OR_DEPARTMENT_OTHER): Admitting: Nurse Practitioner

## 2024-06-25 ENCOUNTER — Encounter: Payer: Self-pay | Admitting: Nurse Practitioner

## 2024-06-25 VITALS — BP 129/88 | HR 68 | Temp 97.3°F | Resp 18 | Wt 244.6 lb

## 2024-06-25 DIAGNOSIS — Z5111 Encounter for antineoplastic chemotherapy: Secondary | ICD-10-CM | POA: Diagnosis not present

## 2024-06-25 DIAGNOSIS — C182 Malignant neoplasm of ascending colon: Secondary | ICD-10-CM | POA: Diagnosis not present

## 2024-06-25 LAB — CMP (CANCER CENTER ONLY)
ALT: 14 U/L (ref 0–44)
AST: 19 U/L (ref 15–41)
Albumin: 4.4 g/dL (ref 3.5–5.0)
Alkaline Phosphatase: 118 U/L (ref 38–126)
Anion gap: 11 (ref 5–15)
BUN: 13 mg/dL (ref 6–20)
CO2: 26 mmol/L (ref 22–32)
Calcium: 10 mg/dL (ref 8.9–10.3)
Chloride: 105 mmol/L (ref 98–111)
Creatinine: 0.94 mg/dL (ref 0.44–1.00)
GFR, Estimated: >60 mL/min (ref >60)
Glucose, Bld: 139 mg/dL — ABNORMAL HIGH (ref 70–99)
Potassium: 3.6 mmol/L (ref 3.5–5.1)
Sodium: 142 mmol/L (ref 135–145)
Total Bilirubin: <0.2 mg/dL (ref 0.0–1.2)
Total Protein: 7.2 g/dL (ref 6.5–8.1)

## 2024-06-25 LAB — CBC WITH DIFFERENTIAL (CANCER CENTER ONLY)
Abs Immature Granulocytes: 0.01 10*3/uL (ref 0.00–0.07)
Basophils Absolute: 0 10*3/uL (ref 0.0–0.1)
Basophils Relative: 1 %
Eosinophils Absolute: 0.2 10*3/uL (ref 0.0–0.5)
Eosinophils Relative: 5 %
HCT: 35.8 % — ABNORMAL LOW (ref 36.0–46.0)
Hemoglobin: 11.6 g/dL — ABNORMAL LOW (ref 12.0–15.0)
Immature Granulocytes: 0 %
Lymphocytes Relative: 39 %
Lymphs Abs: 1.7 10*3/uL (ref 0.7–4.0)
MCH: 28.4 pg (ref 26.0–34.0)
MCHC: 32.4 g/dL (ref 30.0–36.0)
MCV: 87.7 fL (ref 80.0–100.0)
Monocytes Absolute: 0.7 10*3/uL (ref 0.1–1.0)
Monocytes Relative: 16 %
Neutro Abs: 1.7 10*3/uL (ref 1.7–7.7)
Neutrophils Relative %: 39 %
Platelet Count: 312 10*3/uL (ref 150–400)
RBC: 4.08 MIL/uL (ref 3.87–5.11)
RDW: 15.6 % — ABNORMAL HIGH (ref 11.5–15.5)
WBC Count: 4.3 10*3/uL (ref 4.0–10.5)
nRBC: 0 % (ref 0.0–0.2)

## 2024-06-25 MED ORDER — OXALIPLATIN CHEMO INJECTION 100 MG/20ML
85.0000 mg/m2 | Freq: Once | INTRAVENOUS | Status: AC
  Administered 2024-06-25: 200 mg via INTRAVENOUS
  Filled 2024-06-25: qty 40

## 2024-06-25 MED ORDER — SODIUM CHLORIDE 0.9 % IV SOLN
2400.0000 mg/m2 | INTRAVENOUS | Status: DC
  Administered 2024-06-25: 5000 mg via INTRAVENOUS
  Filled 2024-06-25: qty 100

## 2024-06-25 MED ORDER — DEXAMETHASONE SODIUM PHOSPHATE 10 MG/ML IJ SOLN
10.0000 mg | Freq: Once | INTRAMUSCULAR | Status: AC
  Administered 2024-06-25: 10 mg via INTRAVENOUS
  Filled 2024-06-25: qty 1

## 2024-06-25 MED ORDER — LEUCOVORIN CALCIUM INJECTION 350 MG
400.0000 mg/m2 | Freq: Once | INTRAVENOUS | Status: AC
  Administered 2024-06-25: 912 mg via INTRAVENOUS
  Filled 2024-06-25: qty 45.6

## 2024-06-25 MED ORDER — PALONOSETRON HCL INJECTION 0.25 MG/5ML
0.2500 mg | Freq: Once | INTRAVENOUS | Status: AC
  Administered 2024-06-25: 0.25 mg via INTRAVENOUS
  Filled 2024-06-25: qty 5

## 2024-06-25 MED ORDER — DEXTROSE 5 % IV SOLN: INTRAVENOUS | Status: DC

## 2024-06-25 MED ORDER — FLUOROURACIL CHEMO INJECTION 2.5 GM/50ML
400.0000 mg/m2 | Freq: Once | INTRAVENOUS | Status: AC
  Administered 2024-06-25: 1000 mg via INTRAVENOUS
  Filled 2024-06-25: qty 20

## 2024-06-25 MED ORDER — LIDOCAINE-PRILOCAINE 2.5-2.5 % EX CREA: TOPICAL_CREAM | CUTANEOUS | 3 refills | Status: AC

## 2024-06-25 NOTE — Progress Notes (Signed)
 Patient seen by Lonna Cobb NP today  Vitals are within treatment parameters:Yes   Labs are within treatment parameters: Yes   Treatment plan has been signed: Yes   Per physician team, Patient is ready for treatment and there are NO modifications to the treatment plan.

## 2024-06-25 NOTE — Progress Notes (Signed)
 [facility] OFFICE PROGRESS NOTE   Diagnosis: Colon cancer  INTERVAL HISTORY:   Erin Bradford returns as scheduled.  She completed cycle 1 FOLFOX 06/12/2024.  She was seen in an unscheduled visit 06/18/2024 for evaluation of left neck pain and swelling.  Venous Doppler negative for DVT.  She continues to note pain and swelling at the left neck.  The pain extends to the left upper arm.  She requests pain medication to take if needed.  She has occasional nausea.  Bowels are moving.  Objective:  Vital signs in last 24 hours:  Blood pressure 129/88, pulse 68, temperature (!) 97.3 F (36.3 C), temperature source Temporal, resp. rate 18, weight 244 lb 9.6 oz (110.9 kg), last menstrual period 09/17/2017, SpO2 96%.    HEENT: No thrush or ulcers. Resp: Lungs clear bilaterally. Cardio: Regular rate and rhythm. GI: No hepatosplenomegaly. Vascular: Left arm with trace edema.  Persistent fullness at the left supraclavicular fossa with associated tenderness. Skin: Skin hyperpigmentation mainly at the left anterior neck. Port-A-Cath without erythema.  Lab Results:  Lab Results  Component Value Date   WBC 4.3 06/25/2024   HGB 11.6 (L) 06/25/2024   HCT 35.8 (L) 06/25/2024   MCV 87.7 06/25/2024   PLT 312 06/25/2024   NEUTROABS 1.7 06/25/2024    Imaging:  No results found.  Medications: I have reviewed the patient's current medications.  Assessment/Plan: Colon cancer, cecum, stage IIIb (pT4a, PN1a), Ileocecectomy 04/12/2024-moderately differentiated adenocarcinoma of the cecum, no macroscopic tumor perforation, carcinoma invades into the serosal surface, no lymphovascular perineural invasion, negative resection margins, 1/3 nodes, 1 tumor deposit, normal mismatch repair protein expression, microsatellite stable CT abdomen/pelvis 04/11/2024: Dilated appendix with extensive periappendiceal inflammatory changes and a 5 cm fluid collection, no enlarged abdominal pelvic lymph  nodes, unchanged 9 mm right adrenal nodule from 2018 CT chest 04/21/2024: 1.4 cm anterior mediastinal nodule or node, bilateral tiny nonspecific pulmonary nodules PET scan 06/08/2024-developing metastatic lesions right hemiabdominal mesenteric lymph nodes as well as at least 2 liver metastases.  Anterior superior mediastinal soft tissue nodule is similar to the previous chest CT, no abnormal uptake. Cycle 1 FOLFOX 06/12/2024 MRI liver 06/19/2024-numerous small rim-enhancing hypovascular masses seen throughout the liver.  Right lower quadrant mesenteric lymphadenopathy measuring 1.8 cm. Cycle 2 FOLFOX 06/25/2024 Depression Urinary frequency Hypertension Tremors Tachycardia Asthma Family history of breast and colon cancer Colonoscopy-needs colonoscopy approximately 6 months from diagnosis    Disposition: Erin Bradford appears stable.  She has completed 1 cycle of FOLFOX.  Plan to proceed with cycle 2 today as scheduled.  We reviewed the recent MRI of the liver, results and images, with her and her daughters.  They understand there are multiple small liver lesions consistent with metastases.  She developed pain and swelling at the left neck following the first cycle of FOLFOX.  Venous Doppler was negative for DVT.  Neck and chest CTs with no findings to explain symptoms.  We discussed the possibility of subclavian vein stenosis related to the Port-A-Cath.  She will contact the office if symptoms recur/worsen following today's treatment.    CBC and chemistry panel reviewed.  Labs adequate for treatment.  She will return for follow-up and cycle 3 FOLFOX in 2 weeks.  We are available to see her sooner if needed.  Patient seen with Dr. Cloretta.    Olam Ned ANP/GNP-BC   06/25/2024  11:47 AM  This was a shared visit with Olam Ned.  Erin Bradford was interviewed and examined.  We  reviewed the CT and MRI images with her.  The liver MRI is consistent with multiple metastases.  She has  metastatic colon cancer.  She will complete cycle 2 chemotherapy today.  She has persistent mild edema at the left lower neck and left arm.  No venous thrombosis has been identified on imaging studies.  I suspect the edema is related to venous stenosis from the subclavian Port-A-Cath.  She will elevate the arm.  We will ask Dr. Curvin to evaluate her if the edema progresses.  I was present for greater than 50% of today's visit.  I performed medical decision making.  Arvella Hof, MD

## 2024-06-25 NOTE — Patient Instructions (Signed)
 [facility] - A DEPT OF [facility]  Discharge Instructions: Thank you for choosing [facility] to provide your oncology and hematology care.   If you have a lab appointment with the Cancer Center, please go directly to the Cancer Center and check in at the registration area.   Wear comfortable clothing and clothing appropriate for easy access to any Portacath or PICC line.   We strive to give you quality time with your provider. You may need to reschedule your appointment if you arrive late (15 or more minutes).  Arriving late affects you and other patients whose appointments are after yours.  Also, if you miss three or more appointments without notifying the office, you may be dismissed from the clinic at the provider's discretion.      For prescription refill requests, have your pharmacy contact our office and allow 72 hours for refills to be completed.    Today you received the following chemotherapy and/or immunotherapy agents oxaliplatin, leucovorin, fluoruracil.      To help prevent nausea and vomiting after your treatment, we encourage you to take your nausea medication as directed.  BELOW ARE SYMPTOMS THAT SHOULD BE REPORTED IMMEDIATELY: *FEVER GREATER THAN 100.4 F (38 C) OR HIGHER *CHILLS OR SWEATING *NAUSEA AND VOMITING THAT IS NOT CONTROLLED WITH YOUR NAUSEA MEDICATION *UNUSUAL SHORTNESS OF BREATH *UNUSUAL BRUISING OR BLEEDING *URINARY PROBLEMS (pain or burning when urinating, or frequent urination) *BOWEL PROBLEMS (unusual diarrhea, constipation, pain near the anus) TENDERNESS IN MOUTH AND THROAT WITH OR WITHOUT PRESENCE OF ULCERS (sore throat, sores in mouth, or a toothache) UNUSUAL RASH, SWELLING OR PAIN  UNUSUAL VAGINAL DISCHARGE OR ITCHING   Items with * indicate a potential emergency and should be followed up as soon as possible or go to the Emergency Department if any problems should occur.  Please show the CHEMOTHERAPY  ALERT CARD or IMMUNOTHERAPY ALERT CARD at check-in to the Emergency Department and triage nurse.  Should you have questions after your visit or need to cancel or reschedule your appointment, please contact [facility] - A DEPT OF [facility]  Dept: [phone]  and follow the prompts.  Office hours are 8:00 a.m. to 4:30 p.m. Monday - Friday. Please note that voicemails left after 4:00 p.m. may not be returned until the following business day.  We are closed weekends and major holidays. You have access to a nurse at all times for urgent questions. Please call the main number to the clinic Dept: [phone] and follow the prompts.   For any non-urgent questions, you may also contact your provider using MyChart. We now offer e-Visits for anyone 59 and older to request care online for non-urgent symptoms. For details visit [email or web address].   Also download the MyChart app! Go to the app store, search MyChart, open the app, select [place], and log in with your MyChart username and password.  Oxaliplatin Injection What is this medication? OXALIPLATIN (ox AL i PLA tin) treats colorectal cancer. It works by slowing down the growth of cancer cells. This medicine may be used for other purposes; ask your health care provider or pharmacist if you have questions. COMMON BRAND NAME(S): Eloxatin What should I tell my care team before I take this medication? They need to know if you have any of these conditions: Heart disease History of irregular heartbeat or rhythm Liver disease Low blood cell levels (white cells, red cells, and platelets) Lung or breathing  disease, such as asthma Take medications that treat or prevent blood clots Tingling of the fingers, toes, or other nerve disorder An unusual or allergic reaction to oxaliplatin, other medications, foods, dyes, or preservatives If you or your partner are pregnant or trying to get pregnant Breast-feeding How  should I use this medication? This medication is injected into a vein. It is given by your care team in a hospital or clinic setting. Talk to your care team about the use of this medication in children. Special care may be needed. Overdosage: If you think you have taken too much of this medicine contact a poison control center or emergency room at once. NOTE: This medicine is only for you. Do not share this medicine with others. What if I miss a dose? Keep appointments for follow-up doses. It is important not to miss a dose. Call your care team if you are unable to keep an appointment. What may interact with this medication? Do not take this medication with any of the following: Cisapride Dronedarone Pimozide Thioridazine This medication may also interact with the following: Aspirin and aspirin-like medications Certain medications that treat or prevent blood clots, such as warfarin, apixaban, dabigatran, and rivaroxaban Cisplatin Cyclosporine Diuretics Medications for infection, such as acyclovir, adefovir, amphotericin B, bacitracin, cidofovir, foscarnet, ganciclovir, gentamicin, pentamidine, vancomycin  NSAIDs, medications for pain and inflammation, such as ibuprofen  or naproxen Other medications that cause heart rhythm changes Pamidronate Zoledronic acid This list may not describe all possible interactions. Give your health care provider a list of all the medicines, herbs, non-prescription drugs, or dietary supplements you use. Also tell them if you smoke, drink alcohol , or use illegal drugs. Some items may interact with your medicine. What should I watch for while using this medication? Your condition will be monitored carefully while you are receiving this medication. You may need blood work while taking this medication. This medication may make you feel generally unwell. This is not uncommon as chemotherapy can affect healthy cells as well as cancer cells. Report any side effects.  Continue your course of treatment even though you feel ill unless your care team tells you to stop. This medication may increase your risk of getting an infection. Call your care team for advice if you get a fever, chills, sore throat, or other symptoms of a cold or flu. Do not treat yourself. Try to avoid being around people who are sick. Avoid taking medications that contain aspirin, acetaminophen , ibuprofen , naproxen, or ketoprofen unless instructed by your care team. These medications may hide a fever. Be careful brushing or flossing your teeth or using a toothpick because you may get an infection or bleed more easily. If you have any dental work done, tell your dentist you are receiving this medication. This medication can make you more sensitive to cold. Do not drink cold drinks or use ice. Cover exposed skin before coming in contact with cold temperatures or cold objects. When out in cold weather wear warm clothing and cover your mouth and nose to warm the air that goes into your lungs. Tell your care team if you get sensitive to the cold. Talk to your care team if you or your partner are pregnant or think either of you might be pregnant. This medication can cause serious birth defects if taken during pregnancy and for 9 months after the last dose. A negative pregnancy test is required before starting this medication. A reliable form of contraception is recommended while taking this medication and for 9 months  after the last dose. Talk to your care team about effective forms of contraception. Do not father a child while taking this medication and for 6 months after the last dose. Use a condom while having sex during this time period. Do not breastfeed while taking this medication and for 3 months after the last dose. This medication may cause infertility. Talk to your care team if you are concerned about your fertility. What side effects may I notice from receiving this medication? Side effects that  you should report to your care team as soon as possible: Allergic reactions--skin rash, itching, hives, swelling of the face, lips, tongue, or throat Bleeding--bloody or black, tar-like stools, vomiting blood or brown material that looks like coffee grounds, red or dark brown urine, small red or purple spots on skin, unusual bruising or bleeding Dry cough, shortness of breath or trouble breathing Heart rhythm changes--fast or irregular heartbeat, dizziness, feeling faint or lightheaded, chest pain, trouble breathing Infection--fever, chills, cough, sore throat, wounds that don't heal, pain or trouble when passing urine, general feeling of discomfort or being unwell Liver injury--right upper belly pain, loss of appetite, nausea, light-colored stool, dark yellow or brown urine, yellowing skin or eyes, unusual weakness or fatigue Low red blood cell level--unusual weakness or fatigue, dizziness, headache, trouble breathing Muscle injury--unusual weakness or fatigue, muscle pain, dark yellow or brown urine, decrease in amount of urine Pain, tingling, or numbness in the hands or feet Sudden and severe headache, confusion, change in vision, seizures, which may be signs of posterior reversible encephalopathy syndrome (PRES) Unusual bruising or bleeding Side effects that usually do not require medical attention (report to your care team if they continue or are bothersome): Diarrhea Nausea Pain, redness, or swelling with sores inside the mouth or throat Unusual weakness or fatigue Vomiting This list may not describe all possible side effects. Call your doctor for medical advice about side effects. You may report side effects to FDA at 1-800-FDA-1088. Where should I keep my medication? This medication is given in a hospital or clinic. It will not be stored at home. NOTE: This sheet is a summary. It may not cover all possible information. If you have questions about this medicine, talk to your doctor,  pharmacist, or health care provider.  2024 Elsevier/Gold Standard (2023-11-25 00:00:00)  Leucovorin Injection What is this medication? LEUCOVORIN (loo koe VOR in) prevents side effects from certain medications, such as methotrexate. It works by increasing folate levels. This helps protect healthy cells in your body. It may also be used to treat anemia caused by low levels of folate. It can also be used with fluorouracil, a type of chemotherapy, to treat colorectal cancer. It works by increasing the effects of fluorouracil in the body. This medicine may be used for other purposes; ask your health care provider or pharmacist if you have questions. What should I tell my care team before I take this medication? They need to know if you have any of these conditions: Anemia from low levels of vitamin B12 in the blood An unusual or allergic reaction to leucovorin, folic acid , other medications, foods, dyes, or preservatives Pregnant or trying to get pregnant Breastfeeding How should I use this medication? This medication is injected into a vein or a muscle. It is given by your care team in a hospital or clinic setting. Talk to your care team about the use of this medication in children. Special care may be needed. Overdosage: If you think you have taken too much  of this medicine contact a poison control center or emergency room at once. NOTE: This medicine is only for you. Do not share this medicine with others. What if I miss a dose? Keep appointments for follow-up doses. It is important not to miss your dose. Call your care team if you are unable to keep an appointment. What may interact with this medication? Capecitabine Fluorouracil Phenobarbital Phenytoin Primidone  Trimethoprim;sulfamethoxazole This list may not describe all possible interactions. Give your health care provider a list of all the medicines, herbs, non-prescription drugs, or dietary supplements you use. Also tell them if you  smoke, drink alcohol , or use illegal drugs. Some items may interact with your medicine. What should I watch for while using this medication? Your condition will be monitored carefully while you are receiving this medication. This medication may increase the side effects of 5-fluorouracil. Tell your care team if you have diarrhea or mouth sores that do not get better or that get worse. What side effects may I notice from receiving this medication? Side effects that you should report to your care team as soon as possible: Allergic reactions--skin rash, itching, hives, swelling of the face, lips, tongue, or throat This list may not describe all possible side effects. Call your doctor for medical advice about side effects. You may report side effects to FDA at 1-800-FDA-1088. Where should I keep my medication? This medication is given in a hospital or clinic. It will not be stored at home. NOTE: This sheet is a summary. It may not cover all possible information. If you have questions about this medicine, talk to your doctor, pharmacist, or health care provider.  2024 Elsevier/Gold Standard (2022-05-18 00:00:00)  Fluorouracil Injection What is this medication? FLUOROURACIL (flure oh YOOR a sil) treats some types of cancer. It works by slowing down the growth of cancer cells. This medicine may be used for other purposes; ask your health care provider or pharmacist if you have questions. COMMON BRAND NAME(S): Adrucil What should I tell my care team before I take this medication? They need to know if you have any of these conditions: Blood disorders Dihydropyrimidine dehydrogenase (DPD) deficiency Infection, such as chickenpox, cold sores, herpes Kidney disease Liver disease Poor nutrition Recent or ongoing radiation therapy An unusual or allergic reaction to fluorouracil, other medications, foods, dyes, or preservatives If you or your partner are pregnant or trying to get  pregnant Breast-feeding How should I use this medication? This medication is injected into a vein. It is administered by your care team in a hospital or clinic setting. Talk to your care team about the use of this medication in children. Special care may be needed. Overdosage: If you think you have taken too much of this medicine contact a poison control center or emergency room at once. NOTE: This medicine is only for you. Do not share this medicine with others. What if I miss a dose? Keep appointments for follow-up doses. It is important not to miss your dose. Call your care team if you are unable to keep an appointment. What may interact with this medication? Do not take this medication with any of the following: Live virus vaccines This medication may also interact with the following: Medications that treat or prevent blood clots, such as warfarin, enoxaparin , dalteparin This list may not describe all possible interactions. Give your health care provider a list of all the medicines, herbs, non-prescription drugs, or dietary supplements you use. Also tell them if you smoke, drink alcohol , or use  illegal drugs. Some items may interact with your medicine. What should I watch for while using this medication? Your condition will be monitored carefully while you are receiving this medication. This medication may make you feel generally unwell. This is not uncommon as chemotherapy can affect healthy cells as well as cancer cells. Report any side effects. Continue your course of treatment even though you feel ill unless your care team tells you to stop. In some cases, you may be given additional medications to help with side effects. Follow all directions for their use. This medication may increase your risk of getting an infection. Call your care team for advice if you get a fever, chills, sore throat, or other symptoms of a cold or flu. Do not treat yourself. Try to avoid being around people who are  sick. This medication may increase your risk to bruise or bleed. Call your care team if you notice any unusual bleeding. Be careful brushing or flossing your teeth or using a toothpick because you may get an infection or bleed more easily. If you have any dental work done, tell your dentist you are receiving this medication. Avoid taking medications that contain aspirin, acetaminophen , ibuprofen , naproxen, or ketoprofen unless instructed by your care team. These medications may hide a fever. Do not treat diarrhea with over the counter products. Contact your care team if you have diarrhea that lasts more than 2 days or if it is severe and watery. This medication can make you more sensitive to the sun. Keep out of the sun. If you cannot avoid being in the sun, wear protective clothing and sunscreen. Do not use sun lamps, tanning beds, or tanning booths. Talk to your care team if you or your partner wish to become pregnant or think you might be pregnant. This medication can cause serious birth defects if taken during pregnancy and for 3 months after the last dose. A reliable form of contraception is recommended while taking this medication and for 3 months after the last dose. Talk to your care team about effective forms of contraception. Do not father a child while taking this medication and for 3 months after the last dose. Use a condom while having sex during this time period. Do not breastfeed while taking this medication. This medication may cause infertility. Talk to your care team if you are concerned about your fertility. What side effects may I notice from receiving this medication? Side effects that you should report to your care team as soon as possible: Allergic reactions--skin rash, itching, hives, swelling of the face, lips, tongue, or throat Heart attack--pain or tightness in the chest, shoulders, arms, or jaw, nausea, shortness of breath, cold or clammy skin, feeling faint or  lightheaded Heart failure--shortness of breath, swelling of the ankles, feet, or hands, sudden weight gain, unusual weakness or fatigue Heart rhythm changes--fast or irregular heartbeat, dizziness, feeling faint or lightheaded, chest pain, trouble breathing High ammonia level--unusual weakness or fatigue, confusion, loss of appetite, nausea, vomiting, seizures Infection--fever, chills, cough, sore throat, wounds that don't heal, pain or trouble when passing urine, general feeling of discomfort or being unwell Low red blood cell level--unusual weakness or fatigue, dizziness, headache, trouble breathing Pain, tingling, or numbness in the hands or feet, muscle weakness, change in vision, confusion or trouble speaking, loss of balance or coordination, trouble walking, seizures Redness, swelling, and blistering of the skin over hands and feet Severe or prolonged diarrhea Unusual bruising or bleeding Side effects that usually do not require  medical attention (report to your care team if they continue or are bothersome): Dry skin Headache Increased tears Nausea Pain, redness, or swelling with sores inside the mouth or throat Sensitivity to light Vomiting This list may not describe all possible side effects. Call your doctor for medical advice about side effects. You may report side effects to FDA at 1-800-FDA-1088. Where should I keep my medication? This medication is given in a hospital or clinic. It will not be stored at home. NOTE: This sheet is a summary. It may not cover all possible information. If you have questions about this medicine, talk to your doctor, pharmacist, or health care provider.  2024 Elsevier/Gold Standard (2022-04-20 00:00:00)

## 2024-06-26 ENCOUNTER — Telehealth: Payer: Self-pay

## 2024-06-26 ENCOUNTER — Other Ambulatory Visit: Payer: Self-pay | Admitting: Nurse Practitioner

## 2024-06-26 NOTE — Telephone Encounter (Signed)
-----   Message from Olam Ned sent at 06/26/2024  8:19 AM EDT ----- Udenyca added with cycle 3 on 7/19. Please forward for prior authorization.

## 2024-06-26 NOTE — Telephone Encounter (Signed)
 The patient has been approved for Udenyca.

## 2024-06-27 ENCOUNTER — Encounter: Payer: Self-pay | Admitting: *Deleted

## 2024-06-27 ENCOUNTER — Ambulatory Visit: Admitting: Nurse Practitioner

## 2024-06-27 ENCOUNTER — Other Ambulatory Visit

## 2024-06-27 ENCOUNTER — Inpatient Hospital Stay: Attending: Oncology

## 2024-06-27 ENCOUNTER — Ambulatory Visit

## 2024-06-27 VITALS — BP 101/67 | HR 59 | Temp 98.1°F | Resp 18

## 2024-06-27 DIAGNOSIS — R000 Tachycardia, unspecified: Secondary | ICD-10-CM | POA: Diagnosis not present

## 2024-06-27 DIAGNOSIS — I10 Essential (primary) hypertension: Secondary | ICD-10-CM | POA: Diagnosis not present

## 2024-06-27 DIAGNOSIS — M7989 Other specified soft tissue disorders: Secondary | ICD-10-CM | POA: Diagnosis not present

## 2024-06-27 DIAGNOSIS — J45909 Unspecified asthma, uncomplicated: Secondary | ICD-10-CM | POA: Diagnosis not present

## 2024-06-27 DIAGNOSIS — F32A Depression, unspecified: Secondary | ICD-10-CM | POA: Insufficient documentation

## 2024-06-27 DIAGNOSIS — C180 Malignant neoplasm of cecum: Secondary | ICD-10-CM | POA: Insufficient documentation

## 2024-06-27 DIAGNOSIS — C787 Secondary malignant neoplasm of liver and intrahepatic bile duct: Secondary | ICD-10-CM | POA: Diagnosis not present

## 2024-06-27 DIAGNOSIS — Z803 Family history of malignant neoplasm of breast: Secondary | ICD-10-CM | POA: Insufficient documentation

## 2024-06-27 DIAGNOSIS — Z5111 Encounter for antineoplastic chemotherapy: Secondary | ICD-10-CM | POA: Insufficient documentation

## 2024-06-27 DIAGNOSIS — R160 Hepatomegaly, not elsewhere classified: Secondary | ICD-10-CM | POA: Diagnosis not present

## 2024-06-27 DIAGNOSIS — Z5189 Encounter for other specified aftercare: Secondary | ICD-10-CM | POA: Insufficient documentation

## 2024-06-27 DIAGNOSIS — R350 Frequency of micturition: Secondary | ICD-10-CM | POA: Insufficient documentation

## 2024-06-27 DIAGNOSIS — D696 Thrombocytopenia, unspecified: Secondary | ICD-10-CM | POA: Insufficient documentation

## 2024-06-27 DIAGNOSIS — Z800 Family history of malignant neoplasm of digestive organs: Secondary | ICD-10-CM | POA: Diagnosis not present

## 2024-06-27 DIAGNOSIS — C182 Malignant neoplasm of ascending colon: Secondary | ICD-10-CM

## 2024-06-27 DIAGNOSIS — R590 Localized enlarged lymph nodes: Secondary | ICD-10-CM | POA: Diagnosis not present

## 2024-06-27 DIAGNOSIS — R490 Dysphonia: Secondary | ICD-10-CM | POA: Insufficient documentation

## 2024-06-27 DIAGNOSIS — R251 Tremor, unspecified: Secondary | ICD-10-CM | POA: Diagnosis not present

## 2024-06-27 DIAGNOSIS — R918 Other nonspecific abnormal finding of lung field: Secondary | ICD-10-CM | POA: Diagnosis not present

## 2024-06-27 MED ORDER — SODIUM CHLORIDE 0.9% FLUSH
10.0000 mL | INTRAVENOUS | Status: DC | PRN
  Administered 2024-06-27: 10 mL

## 2024-06-27 MED ORDER — HEPARIN SOD (PORK) LOCK FLUSH 100 UNIT/ML IV SOLN
500.0000 [IU] | Freq: Once | INTRAVENOUS | Status: AC | PRN
  Administered 2024-06-27: 500 [IU]

## 2024-06-27 NOTE — Patient Instructions (Signed)

## 2024-07-02 ENCOUNTER — Encounter (HOSPITAL_COMMUNITY): Payer: Self-pay | Admitting: Psychiatry

## 2024-07-02 ENCOUNTER — Telehealth (HOSPITAL_BASED_OUTPATIENT_CLINIC_OR_DEPARTMENT_OTHER): Admitting: Psychiatry

## 2024-07-02 DIAGNOSIS — F411 Generalized anxiety disorder: Secondary | ICD-10-CM | POA: Diagnosis not present

## 2024-07-02 DIAGNOSIS — F330 Major depressive disorder, recurrent, mild: Secondary | ICD-10-CM | POA: Diagnosis not present

## 2024-07-02 DIAGNOSIS — F5101 Primary insomnia: Secondary | ICD-10-CM | POA: Diagnosis not present

## 2024-07-02 MED ORDER — SERTRALINE HCL 100 MG PO TABS: 100.0000 mg | ORAL_TABLET | Freq: Every day | ORAL | 0 refills | Status: DC

## 2024-07-02 MED ORDER — ZOLPIDEM TARTRATE ER 12.5 MG PO TBCR: 12.5000 mg | EXTENDED_RELEASE_TABLET | Freq: Every evening | ORAL | 2 refills | Status: DC | PRN

## 2024-07-02 MED ORDER — BUPROPION HCL ER (XL) 300 MG PO TB24: 300.0000 mg | ORAL_TABLET | Freq: Every day | ORAL | 0 refills | Status: DC

## 2024-07-02 NOTE — Progress Notes (Signed)
 [facility] MD Virtual Progress Note   Patient Location: Home Provider Location: Home Office  I connect with patient by video and verified that I am speaking with correct person by using two identifiers. I discussed the limitations of evaluation and management by telemedicine and the availability of in person appointments. I also discussed with the patient that there may be a patient responsible charge related to this service. The patient expressed understanding and agreed to proceed.  Erin Bradford [id number] 53 y.o.  07/02/2024 11:21 AM  History of Present Illness:  Patient is evaluated by video session.  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  Patient told it was found after she had appendicitis surgery and found to be ruptured and the biopsy shows cancer.  She has multiple surgeries and now just started chemotherapy.  So far she has 2 rounds of chemotherapy and she has to complete total 12 cycles.  Patient told it may take 6 months.  She feels sad, depressed, tearful and sometimes hopeless but denies any suicidal thoughts.  Her sleep is fair despite taking Ambien  CR 12.5.  She had a good support from her family.  Her mother takes her to the doctor's appointment.  She has 2 daughter as one of them moved from [place]  until she get better.  Her younger daughter lives with the patient.  Patient has not seen Ms. Edwena in a while because she was busy with her general health.  Now she will see a therapist at cancer center.  She reported feeling very weak, tired and dysphoria.  She lost more than 20 pounds in past few months.  She denies any panic attack or any anxiety attack.  She is hopeful once chemotherapy done she should be back to baseline.  She denies drinking or using any illegal substances patient denies any mania, psychosis or any hallucination.  She admitted a lot of side effects from the  chemotherapy which includes fatigue, tiredness, weight loss.  Reported mild tremors but started after chemotherapy.  She does not want to change the medication at this time.  Past Psychiatric History: H/O depression.  No h/o inpatient, mania, psychosis or suicidal attempt.  Tried Prozac  which worked but had weight gain.  Tried Vistaril , trazodone , Thorazine , Lunesta  3 mg, Doxepin  50 mg and saphris  5 mg with poor outcome.  Lamictal  caused rash and temazepam  caused headache.     Outpatient Encounter Medications as of 07/02/2024  Medication Sig   acetaminophen  (TYLENOL ) 500 MG tablet Take 2 tablets (1,000 mg total) by mouth every 6 (six) hours.   albuterol  (PROVENTIL ) (2.5 MG/3ML) 0.083% nebulizer solution USE 1 VIAL VIA NEBULIZER EVERY 6 HOURS AS NEEDED FOR WHEEZING OR  SHORTNESS OF BREATH   buPROPion  (WELLBUTRIN  XL) 300 MG 24 hr tablet Take 1 tablet (300 mg total) by mouth daily.   carvedilol  (COREG ) 25 MG tablet TAKE 1 TABLET BY MOUTH TWICE  DAILY   cetirizine  (ZYRTEC ) 10 MG tablet TAKE 1 TABLET(10 MG) BY MOUTH AT BEDTIME AS NEEDED FOR ALLERGIES   famotidine  (PEPCID ) 20 MG tablet TAKE 1 TABLET(20 MG) BY MOUTH TWICE DAILY (Patient taking differently: Take 20 mg by mouth 2 (two) times daily.)   fluticasone  (FLONASE ) 50 MCG/ACT nasal spray SHAKE LIQUID AND USE 2 SPRAYS IN EACH NOSTRIL DAILY   furosemide  (LASIX ) 20 MG tablet Take 20 mg by mouth 2 (two) times daily.    gabapentin  (NEURONTIN )  300 MG capsule Take 600 mg by mouth 3 (three) times daily.   lidocaine -prilocaine  (EMLA ) cream Apply to port site 1-2 hours prior to use   loperamide  (IMODIUM  A-D) 2 MG tablet Take 1 tablet (2 mg total) by mouth 4 (four) times daily as needed for diarrhea or loose stools.   ondansetron  (ZOFRAN ) 8 MG tablet Take 1 tablet (8 mg total) by mouth every 8 (eight) hours as needed for nausea or vomiting. Do not start until 72 hours after day 1 chemo   oxybutynin  (DITROPAN -XL) 10 MG 24 hr tablet TAKE 1 TABLET BY MOUTH AT   BEDTIME   oxyCODONE  (ROXICODONE ) 5 MG immediate release tablet Take 1 tablet (5 mg total) by mouth every 6 (six) hours as needed for severe pain (pain score 7-10).   primidone  (MYSOLINE ) 50 MG tablet TAKE 1 TABLET BY MOUTH EVERY MORNING AND 6 TABLETS EVERY EVENING   prochlorperazine  (COMPAZINE ) 10 MG tablet Take 1 tablet (10 mg total) by mouth every 6 (six) hours as needed for nausea or vomiting.   sertraline  (ZOLOFT ) 100 MG tablet Take 1 tablet (100 mg total) by mouth daily.   spironolactone  (ALDACTONE ) 100 MG tablet Take 100 mg by mouth 2 (two) times daily.   SYMBICORT  80-4.5 MCG/ACT inhaler USE 2 INHALATIONS BY MOUTH TWICE DAILY (Patient taking differently: Inhale 2 puffs into the lungs in the morning and at bedtime.)   tiZANidine  (ZANAFLEX ) 4 MG tablet Take 4 mg by mouth 3 (three) times daily as needed for muscle spasms.   VENTOLIN  HFA 108 (90 Base) MCG/ACT inhaler USE 2 INHALATIONS BY MOUTH EVERY 6 HOURS AS NEEDED FOR WHEEZING  OR SHORTNESS OF BREATH (Patient taking differently: Inhale 2 puffs into the lungs every 6 (six) hours as needed for wheezing or shortness of breath.)   zolpidem  (AMBIEN  CR) 12.5 MG CR tablet Take 1 tablet (12.5 mg total) by mouth at bedtime as needed for sleep.   No facility-administered encounter medications on file as of 07/02/2024.    Recent Results (from the past 2160 hours)  Lipase, blood     Status: None   Collection Time: 04/10/24  2:20 PM  Result Value Ref Range   Lipase 20 11 - 51 U/L    Comment: Performed at [facility], [address], [place], [place] [address]  Comprehensive metabolic panel     Status: Abnormal   Collection Time: 04/10/24  2:20 PM  Result Value Ref Range   Sodium 134 (L) 135 - 145 mmol/L   Potassium 3.7 3.5 - 5.1 mmol/L   Chloride 99 98 - 111 mmol/L   CO2 23 22 - 32 mmol/L   Glucose, Bld 156 (H) 70 - 99 mg/dL    Comment: Glucose reference range applies only to samples taken after fasting for at least 8 hours.   BUN 6 6 - 20  mg/dL   Creatinine, Ser 9.16 0.44 - 1.00 mg/dL   Calcium  9.4 8.9 - 10.3 mg/dL   Total Protein 7.3 6.5 - 8.1 g/dL   Albumin  3.4 (L) 3.5 - 5.0 g/dL   AST 27 15 - 41 U/L   ALT 44 0 - 44 U/L   Alkaline Phosphatase 175 (H) 38 - 126 U/L   Total Bilirubin 0.6 0.0 - 1.2 mg/dL   GFR, Estimated >39 >39 mL/min    Comment: (NOTE) Calculated using the CKD-EPI Creatinine Equation (2021)    Anion gap 12 5 - 15    Comment: Performed at [facility],  [address], [place], [place] [address]  CBC     Status: Abnormal   Collection Time: 04/10/24  2:20 PM  Result Value Ref Range   WBC 23.4 (H) 4.0 - 10.5 K/uL   RBC 4.33 3.87 - 5.11 MIL/uL   Hemoglobin 12.6 12.0 - 15.0 g/dL   HCT 61.7 63.9 - 53.9 %   MCV 88.2 80.0 - 100.0 fL   MCH 29.1 26.0 - 34.0 pg   MCHC 33.0 30.0 - 36.0 g/dL   RDW 87.6 88.4 - 84.4 %   Platelets 581 (H) 150 - 400 K/uL   nRBC 0.0 0.0 - 0.2 %    Comment: Performed at [facility], [address] [place], [place] [address]  CBC     Status: Abnormal   Collection Time: 04/11/24  5:34 PM  Result Value Ref Range   WBC 27.5 (H) 4.0 - 10.5 K/uL   RBC 3.95 3.87 - 5.11 MIL/uL   Hemoglobin 11.7 (L) 12.0 - 15.0 g/dL   HCT 64.8 (L) 63.9 - 53.9 %   MCV 88.9 80.0 - 100.0 fL   MCH 29.6 26.0 - 34.0 pg   MCHC 33.3 30.0 - 36.0 g/dL   RDW 87.4 88.4 - 84.4 %   Platelets 477 (H) 150 - 400 K/uL   nRBC 0.0 0.0 - 0.2 %    Comment: Performed at [facility], [address] [place], [place] [address]  Basic metabolic panel     Status: Abnormal   Collection Time: 04/11/24  5:34 PM  Result Value Ref Range   Sodium 133 (L) 135 - 145 mmol/L   Potassium 3.8 3.5 - 5.1 mmol/L   Chloride 96 (L) 98 - 111 mmol/L   CO2 25 22 - 32 mmol/L   Glucose, Bld 139 (H) 70 - 99 mg/dL    Comment: Glucose reference range applies only to samples taken after fasting for at least 8 hours.   BUN 12 6 - 20 mg/dL   Creatinine, Ser 8.48 (H) 0.44 - 1.00 mg/dL   Calcium  9.9 8.9 - 10.3  mg/dL   GFR, Estimated 41 (L) >60 mL/min    Comment: (NOTE) Calculated using the CKD-EPI Creatinine Equation (2021)    Anion gap 12 5 - 15    Comment: Performed at [facility], [address], [place], [place] [address]  Hepatic function panel     Status: Abnormal   Collection Time: 04/11/24  5:39 PM  Result Value Ref Range   Total Protein 7.3 6.5 - 8.1 g/dL   Albumin  4.1 3.5 - 5.0 g/dL   AST 22 15 - 41 U/L   ALT 34 0 - 44 U/L   Alkaline Phosphatase 181 (H) 38 - 126 U/L   Total Bilirubin 0.8 0.0 - 1.2 mg/dL   Bilirubin, Direct 0.2 0.0 - 0.2 mg/dL   Indirect Bilirubin 0.6 0.3 - 0.9 mg/dL    Comment: Performed at [facility], [address], [place], [place] [address]  Urinalysis, Routine w reflex microscopic -Urine, Clean Catch     Status: Abnormal   Collection Time: 04/11/24  7:03 PM  Result Value Ref Range   Color, Urine YELLOW YELLOW   APPearance CLEAR CLEAR   Specific Gravity, Urine >1.046 (H) 1.005 - 1.030   pH 5.5 5.0 - 8.0   Glucose, UA NEGATIVE NEGATIVE mg/dL   Hgb urine dipstick NEGATIVE NEGATIVE   Bilirubin Urine SMALL (A) NEGATIVE   Ketones, ur NEGATIVE NEGATIVE mg/dL  Protein, ur 30 (A) NEGATIVE mg/dL   Nitrite POSITIVE (A) NEGATIVE   Leukocytes,Ua NEGATIVE NEGATIVE   RBC / HPF 6-10 0 - 5 RBC/hpf   WBC, UA 11-20 0 - 5 WBC/hpf   Bacteria, UA RARE (A) NONE SEEN   Squamous Epithelial / HPF 0-5 0 - 5 /HPF   Mucus PRESENT    Hyaline Casts, UA PRESENT     Comment: Performed at [facility], [address] [place], [place] [address]  Urine Culture     Status: Abnormal   Collection Time: 04/11/24  8:38 PM   Specimen: Urine, Clean Catch  Result Value Ref Range   Specimen Description      URINE, CLEAN CATCH Performed at [facility], [address], [place], [place] [address]    Special Requests      NONE Performed at [facility], [address],  [place], [place] [address]    Culture (A)     <10,000 COLONIES/mL INSIGNIFICANT GROWTH Performed at [facility], [address], [place], [place] [address]    Report Status 04/13/2024 FINAL   Blood culture (routine x 2)     Status: None   Collection Time: 04/11/24  9:00 PM   Specimen: BLOOD RIGHT FOREARM  Result Value Ref Range   Specimen Description      BLOOD RIGHT FOREARM Performed at [facility], [address], [place], [place] [address]    Special Requests      BOTTLES DRAWN AEROBIC AND ANAEROBIC Blood Culture results may not be optimal due to an inadequate volume of blood received in culture bottles Performed at [facility], [address] [place], [place] [address]    Culture      NO GROWTH 5 DAYS Performed at [facility], [address] [place], [place] [address]    Report Status 04/17/2024 FINAL   Lactic acid, plasma     Status: None   Collection Time: 04/11/24  9:05 PM  Result Value Ref Range   Lactic Acid, Venous 1.0 0.5 - 1.9 mmol/L    Comment: Performed at [facility], [address], [place], [place] [address]  Molecular Pathology     Status: None   Collection Time: 04/12/24 12:00 AM  Result Value Ref Range   Molecular Pathology      MOLECULAR PATHOLOGY [place] [address] [place] , [place] [address] Telephone : [phone] Fax : [phone]  REPORT OF MOLECULAR PATHOLOGY   Accession #: [id number] Patient Name: ELVERTA, DIMICELI Visit # :   MRN: [id number] Physician: Rebbecca Crawford DOB/Age 11/20/1971 (Age: 39) Gender: F Collected Date: 04/12/2024 Received Date: 04/23/2024  FINAL DIAGNOSIS       1. Block Only, ileocecum, resection :       SEE ATTACHED REPORT FROM [facility]       DATE SIGNED OUT: 05/04/2024 ELECTRONIC SIGNATURE : Psychiatric nurse, Pathologist  MICROSCOPIC DESCRIPTION  CASE COMMENTS STAINS USED IN  DIAGNOSIS: H&E H&E Microsatellite Instability (MSI) (PCR) CUT 4 BLANKS FOR IHC    CLINICAL HISTORY  SPECIMEN(S) OBTAINED 1. Block Only, Ileocecum, Resection  SPECIMEN COMMENTS: SPECIMEN CLINICAL INFORMATION:    Gross Description 1. Rcvd 2 slides labeled [id number] A2, A4 - A.W. 04/23/24 11:17:12 AM        Report signed out  from the following location(s) [place] [address] [place], [place] [address] CLIA #: [id number]  [facility] [address] [place], [place] [address] CLIA #: [id number]   Basic metabolic panel     Status: Abnormal   Collection Time: 04/12/24  5:07 AM  Result Value Ref Range   Sodium 136 135 - 145 mmol/L   Potassium 3.3 (L) 3.5 - 5.1 mmol/L   Chloride 99 98 - 111 mmol/L   CO2 26 22 - 32 mmol/L   Glucose, Bld 113 (H) 70 - 99 mg/dL    Comment: Glucose reference range applies only to samples taken after fasting for at least 8 hours.   BUN 9 6 - 20 mg/dL   Creatinine, Ser 9.12 0.44 - 1.00 mg/dL   Calcium  9.1 8.9 - 10.3 mg/dL   GFR, Estimated >39 >39 mL/min    Comment: (NOTE) Calculated using the CKD-EPI Creatinine Equation (2021)    Anion gap 11 5 - 15    Comment: Performed at [facility], [address] [place], [place] [address]  CBC     Status: Abnormal   Collection Time: 04/12/24  5:07 AM  Result Value Ref Range   WBC 25.8 (H) 4.0 - 10.5 K/uL   RBC 3.84 (L) 3.87 - 5.11 MIL/uL   Hemoglobin 11.3 (L) 12.0 - 15.0 g/dL   HCT 66.2 (L) 63.9 - 53.9 %   MCV 87.8 80.0 - 100.0 fL   MCH 29.4 26.0 - 34.0 pg   MCHC 33.5 30.0 - 36.0 g/dL   RDW 87.2 88.4 - 84.4 %   Platelets 476 (H) 150 - 400 K/uL   nRBC 0.0 0.0 - 0.2 %    Comment: Performed at [facility], [address] [place], [place] [address]  Blood culture (routine x 2)     Status: None   Collection Time: 04/12/24 11:46 AM   Specimen: BLOOD RIGHT HAND  Result Value Ref Range   Specimen Description  BLOOD RIGHT HAND    Special Requests      BOTTLES DRAWN AEROBIC AND ANAEROBIC Blood Culture results may not be optimal due to an inadequate volume of blood received in culture bottles   Culture      NO GROWTH 5 DAYS Performed at [facility], [address] [place], [place] [address]    Report Status 04/17/2024 FINAL   Type and screen [facility]     Status: None   Collection Time: 04/12/24  2:25 PM  Result Value Ref Range   ABO/RH(D) O POS    Antibody Screen NEG    Sample Expiration      04/15/2024,2359 Performed at [facility], [address] [place], [place] [address]   Surgical pathology     Status: None   Collection Time: 04/12/24  3:07 PM  Result Value Ref Range   SURGICAL PATHOLOGY      SURGICAL PATHOLOGY  THIS IS AN ADDENDUM REPORT  CASE: [id number] PATIENT: [facility] Surgical Pathology Report  Reason for Addendum #1:  DNA Mismatch Repair IHC Results  Clinical History: perforated appendicitis (cf)     FINAL MICROSCOPIC DIAGNOSIS:  A. 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 APPENDIX, APPENDECTOMY: - Acute appendicitis with serositis and serosal adhesions - No evidence of malignancy  COMMENT:  Please note that entire fibroadipose tissue was submitted looking for possible lymph nodes.   ONCOLOGY TABLE:   COLON AND RECTUM, CARCINOMA:  Resection, Including Transanal Disk Excision  of Rectal Neoplasms  Procedure: Resection, ileocecal Tumor Site: Cecum Tumor Size: 4.5 cm Macroscopic Tumor Perforation: Not identified Histologic Type: Adenocarcinoma Histologic Grade: G2: Moderately  differentiated Multiple Primary Sites: Not applicable Tumor Extension: Carcinoma invades into the serosal surface Lymphovascular Invasion: Not identified Perineural Invasion: Not identified Treatment Effect: No known presurgical therapy Margins:      Margin Status for Invasive Carcinoma: All margins negative for invasive carcinoma      Margin Status for Non-Invasive Tumor: All margins negative for high-grade dysplasia / intramucosal           carcinoma and low-grade dysplasia Regional Lymph Nodes:      Number of Lymph Nodes with Tumor: 1      Number of Lymph Nodes Examined: 3 Tumor Deposits: 1 Distant Metastasis:      Distant Site(s) Involved: Not applicable Pathologic Stage Classification (pTNM, AJCC 8th Edition): pT4a, pN1a Ancillary Studies: MMR / MSI testing  will be ordered Representative Tumor Block: A4 Comments: None  (v4.2.0.1)     GROSS DESCRIPTION:  A.  Specimen: Received fresh is ileocecectomy Length: Terminal ileum = 8.2 cm in length by 1.4 cm in diameter, cecum = 5.4 cm in length by 2.5 cm in diameter, large intestine = 3.0 cm in length by 1.5 cm in diameter Serosa: Pink-tan and roughened at the presumed site of appendix Contents: A minimal amount of digestive material. Mucosa/Wall: There is a 4.5 x 2.8 cm area of roughened, gray-green mucosa at the level of the cecum.  No further distinct lesions are grossly identified. Lymph nodes: 2 lymph node candidates measuring 0.3 and 0.5 cm are identified. Block Summary: A1 = proximal margin A2 = distal margin A3 = appendiceal stump A4 = cecum A5 = 2 lymph node candidates, whole *Additional sections are submitted as follows: A6-A7 = cecum, full-thickness A8-A10 = 12 lymph node candidates, whole, 4 per block A11 = 3 lymph node candidates, whole A12-A15 = add itional pericolic fat (KW, 04/17/2024) A16-A57= remainder of fat (KL 04/19/2024)   B.  Specimen: Received fresh is a 5.1 x 4.0 x 2.5 cm fragment  of gray-tan tan, hemorrhagic, fatty soft tissue. Size: Sectioning reveals a 4.5 cm in length by 1.2 cm in diameter appendix with a staple line at the presumed margin. Serosa: Pink-red, with scattered areas of hemorrhage, surrounded by a layer of fat. Mucosa: The mucosa is pink-tan, without distinct lesions. Wall: The wall has a thickness of 0.5 cm Lumen: Pinpoint without identifiable fecal material/fecaliths. Additional findings: There is a 2.5 cm in greatest dimension area of gray-green, grossly necrotic soft tissue adjacent to the appendix. Block Summary: B1 appendix margin B2 cross-section of appendix B3 perpendicular section of appendiceal tip B4 necrotic soft tissue MARYSUE, 04/13/2024)   Final Diagnosis performed by Katrine Muskrat, MD.   Electronically signed 04/20/2024 Technical component performed at [facility]. [facility], [address], [place], [place] [address].  Professional component performed at [facility], [address], [place], [place] [address].  Immunohistochemistry Technical component (if applicable) was performed at [facility]. [address], [place], [place] [address].   IMMUNOHISTOCHEMISTRY DISCLAIMER (if applicable): Some of these immunohistochemical stains may have been developed and the performance characteristics determine by [facility]. Some may not have been cleared or approved  by the U.S. Food and Drug Administration. The FDA has determined that such clearance or approval is not necessary. This test is used for clinical purposes. It should not be regarded as investigational or for research. This laboratory is certified under the Clinical Laboratory Improvement Amendments of 1988 (CLIA-88) as qualified to perform high complexity clinical laboratory testing.  The controls stained appropria tely.   IHC stains are performed on formalin fixed, paraffin embedded tissue using a  3,3diaminobenzidine (DAB) chromogen and Leica Bond Autostainer System. The staining intensity of the nucleus is score manually and is reported as the percentage of tumor cell nuclei demonstrating specific nuclear staining. The specimens are fixed in 10% Neutral Formalin for at least 6 hours and up to 72hrs. These tests are validated on decalcified tissue. Results should be interpreted with caution given the possibility of false negative results on decalcified specimens. Antibody Clones are as follows ER-clone [id number], PR-clone 16, Ki67- clone MM1. Some of these immunohistochemical stains may have been developed and the performance characteristics determined by [facility].       ADDENDUM:  Mismatch Repair Protein (IHC)  SUMMARY INTERPRETATION: NORMAL  There is preserved expression of the major MMR proteins. There is a very low probability that microsatellite instability (M SI) is present. However, certain clinically significant MMR protein mutations may result in preservation of nuclear expression. It is recommended that the preservation of protein expression be correlated with molecular based MSI testing.  IHC EXPRESSION RESULTS  TEST           RESULT MLH1:          Preserved nuclear expression MSH2:          Preserved nuclear expression MSH6:          Preserved nuclear expression PMS2:          Preserved nuclear expression  References: 1. Guidelines on Genetic Evaluation and Management of Lynch Syndrome: A Consensus Statement by the US  Multi-Society Task Force on Colorectal Cancer Gwenn HERO. Louvenia , MD, and other . Am JINNY Bruns 2014; (972) [phone]; doi: 10.1038/ajg.2014.186; published online 17 July 2013 2. Outcomes of screening endometrial cancer patients for Lynch syndrome by patient-administered checklist. Blondie MS, and others. Gynecol Oncol 2013;131(3):619-623. 3. Muir-Torre syndrome (MTS): An update and approach  to diagnosis and management. Jenkins EMERSON Rush, MD and others. J Am Acad Dermatol [phone]  Disclaimer Some of these immunohistochemical stains may have been developed and the performance characteristics determined by [facility]. Some may not have been cleared or approved by the U.S. Food and Drug Administration. The FDA has determined that such clearance or approval is not necessary. This test is used for clinical purposes. It should not be regarded as investigational or for research. This laboratory is certified under the Clinical Laboratory Improvement Amendments of 1988 (CLIA-88) as qualified to perform high complexity clinical laboratory testing. Testing performed: [facility] [address], [place] , [place] [address]             Addendum #1 performed by Katrine Muskrat, MD.   Electronically signed 04/20/2024 Technical component performed at [facility]. [facility], [address] [place], [place] [address].  Profe ssional component performed at [facility], [address] [place], [place] [address].  Immunohistochemistry Technical component (if applicable) was performed at [facility] [address], [place], [place] [address].   IMMUNOHISTOCHEMISTRY DISCLAIMER (if applicable): Some of these immunohistochemical stains may have been developed and the  performance characteristics determine by [facility]. Some may not have been cleared or approved by the U.S. Food and Drug Administration. The FDA has determined that such clearance or approval is not necessary. This test is used for clinical purposes. It should not be regarded as investigational or for research. This laboratory is certified under the Clinical Laboratory Improvement Amendments of 1988 (CLIA-88) as qualified to perform high complexity clinical laboratory testing.  The controls stained appropriately.   IHC stains are performed on formalin  fixed, paraffin embed ded tissue using a 3,3diaminobenzidine (DAB) chromogen and Leica Bond Autostainer System. The staining intensity of the nucleus is score manually and is reported as the percentage of tumor cell nuclei demonstrating specific nuclear staining. The specimens are fixed in 10% Neutral Formalin for at least 6 hours and up to 72hrs. These tests are validated on decalcified tissue. Results should be interpreted with caution given the possibility of false negative results on decalcified specimens. Antibody Clones are as follows ER-clone 43F, PR-clone 16, Ki67- clone MM1. Some of these immunohistochemical stains may have been developed and the performance characteristics determined by [facility].   Basic metabolic panel     Status: Abnormal   Collection Time: 04/13/24  3:26 AM  Result Value Ref Range   Sodium 137 135 - 145 mmol/L   Potassium 3.7 3.5 - 5.1 mmol/L   Chloride 99 98 - 111 mmol/L   CO2 25 22 - 32 mmol/L   Glucose, Bld 157 (H) 70 - 99 mg/dL    Comment: Glucose reference range applies only to samples taken after fasting for at least 8 hours.   BUN 5 (L) 6 - 20 mg/dL   Creatinine, Ser 9.24 0.44 - 1.00 mg/dL   Calcium  8.4 (L) 8.9 - 10.3 mg/dL   GFR, Estimated >39 >39 mL/min    Comment: (NOTE) Calculated using the CKD-EPI Creatinine Equation (2021)    Anion gap 13 5 - 15    Comment: Performed at [facility], [address], [place], [place] [address]  CBC     Status: Abnormal   Collection Time: 04/13/24  3:26 AM  Result Value Ref Range   WBC 27.0 (H) 4.0 - 10.5 K/uL   RBC 3.74 (L) 3.87 - 5.11 MIL/uL   Hemoglobin 11.1 (L) 12.0 - 15.0 g/dL   HCT 66.6 (L) 63.9 - 53.9 %   MCV 89.0 80.0 - 100.0 fL   MCH 29.7 26.0 - 34.0 pg   MCHC 33.3 30.0 - 36.0 g/dL   RDW 87.2 88.4 - 84.4 %   Platelets 515 (H) 150 - 400 K/uL   nRBC 0.0 0.0 - 0.2 %    Comment: Performed at [facility], [address] [place], [place] [address]  CBC with  Differential/Platelet     Status: Abnormal   Collection Time: 04/16/24  6:57 AM  Result Value Ref Range   WBC 20.5 (H) 4.0 - 10.5 K/uL   RBC 4.16 3.87 - 5.11 MIL/uL   Hemoglobin 12.4 12.0 - 15.0 g/dL   HCT 62.4 63.9 - 53.9 %   MCV 90.1 80.0 - 100.0 fL   MCH 29.8 26.0 - 34.0 pg   MCHC 33.1 30.0 - 36.0 g/dL   RDW 86.9 88.4 - 84.4 %   Platelets 654 (H) 150 - 400 K/uL   nRBC 0.1 0.0 - 0.2 %   Neutrophils Relative % 72 %   Neutro Abs 14.9 (H) 1.7 - 7.7 K/uL   Lymphocytes  Relative 13 %   Lymphs Abs 2.7 0.7 - 4.0 K/uL   Monocytes Relative 9 %   Monocytes Absolute 1.9 (H) 0.1 - 1.0 K/uL   Eosinophils Relative 3 %   Eosinophils Absolute 0.6 (H) 0.0 - 0.5 K/uL   Basophils Relative 1 %   Basophils Absolute 0.1 0.0 - 0.1 K/uL   Immature Granulocytes 2 %   Abs Immature Granulocytes 0.41 (H) 0.00 - 0.07 K/uL    Comment: Performed at [facility], [address], [place], [place] [address]  Basic metabolic panel     Status: Abnormal   Collection Time: 04/16/24  6:57 AM  Result Value Ref Range   Sodium 140 135 - 145 mmol/L   Potassium 4.0 3.5 - 5.1 mmol/L   Chloride 98 98 - 111 mmol/L   CO2 32 22 - 32 mmol/L   Glucose, Bld 138 (H) 70 - 99 mg/dL    Comment: Glucose reference range applies only to samples taken after fasting for at least 8 hours.   BUN 11 6 - 20 mg/dL   Creatinine, Ser 9.20 0.44 - 1.00 mg/dL   Calcium  9.5 8.9 - 10.3 mg/dL   GFR, Estimated >39 >39 mL/min    Comment: (NOTE) Calculated using the CKD-EPI Creatinine Equation (2021)    Anion gap 10 5 - 15    Comment: Performed at [facility], [address] [place], [place] [address]  CBC     Status: Abnormal   Collection Time: 04/17/24  4:12 AM  Result Value Ref Range   WBC 20.3 (H) 4.0 - 10.5 K/uL   RBC 3.69 (L) 3.87 - 5.11 MIL/uL   Hemoglobin 10.8 (L) 12.0 - 15.0 g/dL   HCT 66.0 (L) 63.9 - 53.9 %   MCV 91.9 80.0 - 100.0 fL   MCH 29.3 26.0 - 34.0 pg   MCHC 31.9 30.0 - 36.0 g/dL   RDW 86.9 88.4 - 84.4 %    Platelets 583 (H) 150 - 400 K/uL   nRBC 0.0 0.0 - 0.2 %    Comment: Performed at [facility], [address], [place], [place] [address]  CBC     Status: Abnormal   Collection Time: 04/18/24  1:39 AM  Result Value Ref Range   WBC 22.3 (H) 4.0 - 10.5 K/uL   RBC 3.68 (L) 3.87 - 5.11 MIL/uL   Hemoglobin 10.7 (L) 12.0 - 15.0 g/dL   HCT 66.1 (L) 63.9 - 53.9 %   MCV 91.8 80.0 - 100.0 fL   MCH 29.1 26.0 - 34.0 pg   MCHC 31.7 30.0 - 36.0 g/dL   RDW 86.8 88.4 - 84.4 %   Platelets 529 (H) 150 - 400 K/uL   nRBC 0.0 0.0 - 0.2 %    Comment: Performed at [facility], [address] [place], [place] [address]  CBC     Status: Abnormal   Collection Time: 04/19/24  4:05 AM  Result Value Ref Range   WBC 20.7 (H) 4.0 - 10.5 K/uL   RBC 3.30 (L) 3.87 - 5.11 MIL/uL   Hemoglobin 9.7 (L) 12.0 - 15.0 g/dL   HCT 69.5 (L) 63.9 - 53.9 %   MCV 92.1 80.0 - 100.0 fL   MCH 29.4 26.0 - 34.0 pg   MCHC 31.9 30.0 - 36.0 g/dL   RDW 86.7 88.4 - 84.4 %   Platelets 486 (H) 150 - 400 K/uL   nRBC 0.1 0.0 - 0.2 %  Comment: Performed at [facility], [address] [place], [place] [address]  Basic metabolic panel with GFR     Status: Abnormal   Collection Time: 04/19/24  4:05 AM  Result Value Ref Range   Sodium 138 135 - 145 mmol/L   Potassium 2.9 (L) 3.5 - 5.1 mmol/L   Chloride 102 98 - 111 mmol/L   CO2 27 22 - 32 mmol/L   Glucose, Bld 121 (H) 70 - 99 mg/dL    Comment: Glucose reference range applies only to samples taken after fasting for at least 8 hours.   BUN <5 (L) 6 - 20 mg/dL   Creatinine, Ser 9.29 0.44 - 1.00 mg/dL   Calcium  8.4 (L) 8.9 - 10.3 mg/dL   GFR, Estimated >39 >39 mL/min    Comment: (NOTE) Calculated using the CKD-EPI Creatinine Equation (2021)    Anion gap 9 5 - 15    Comment: Performed at [facility], [address], [place], [place] [address]  Magnesium      Status: None   Collection Time: 04/20/24  3:58 AM  Result Value Ref Range   Magnesium  1.9 1.7 -  2.4 mg/dL    Comment: Performed at [facility], [address] [place], [place] [address]  Basic metabolic panel     Status: Abnormal   Collection Time: 04/20/24  3:59 AM  Result Value Ref Range   Sodium 140 135 - 145 mmol/L   Potassium 3.2 (L) 3.5 - 5.1 mmol/L   Chloride 106 98 - 111 mmol/L   CO2 26 22 - 32 mmol/L   Glucose, Bld 125 (H) 70 - 99 mg/dL    Comment: Glucose reference range applies only to samples taken after fasting for at least 8 hours.   BUN <5 (L) 6 - 20 mg/dL   Creatinine, Ser 9.34 0.44 - 1.00 mg/dL   Calcium  8.3 (L) 8.9 - 10.3 mg/dL   GFR, Estimated >39 >39 mL/min    Comment: (NOTE) Calculated using the CKD-EPI Creatinine Equation (2021)    Anion gap 8 5 - 15    Comment: Performed at [facility], [address] [place], [place] [address]  CBC     Status: Abnormal   Collection Time: 04/20/24 10:15 AM  Result Value Ref Range   WBC 15.4 (H) 4.0 - 10.5 K/uL   RBC 3.23 (L) 3.87 - 5.11 MIL/uL   Hemoglobin 9.4 (L) 12.0 - 15.0 g/dL   HCT 70.2 (L) 63.9 - 53.9 %   MCV 92.0 80.0 - 100.0 fL   MCH 29.1 26.0 - 34.0 pg   MCHC 31.6 30.0 - 36.0 g/dL   RDW 86.5 88.4 - 84.4 %   Platelets 471 (H) 150 - 400 K/uL   nRBC 0.2 0.0 - 0.2 %    Comment: Performed at [facility], [address], [place], [place] [address]  Basic metabolic panel with GFR     Status: Abnormal   Collection Time: 04/21/24  2:23 AM  Result Value Ref Range   Sodium 141 135 - 145 mmol/L   Potassium 3.6 3.5 - 5.1 mmol/L   Chloride 107 98 - 111 mmol/L   CO2 28 22 - 32 mmol/L   Glucose, Bld 153 (H) 70 - 99 mg/dL    Comment: Glucose reference range applies only to samples taken after fasting for at least 8 hours.   BUN <5 (L) 6 - 20 mg/dL   Creatinine, Ser 9.31 0.44 -  1.00 mg/dL   Calcium  7.9 (L) 8.9 - 10.3 mg/dL   GFR, Estimated >39 >39 mL/min    Comment: (NOTE) Calculated using the CKD-EPI Creatinine Equation (2021)    Anion gap 6 5 - 15    Comment: Performed at [facility], [address] [place], [place] [address]  Surgical PCR screen     Status: None   Collection Time: 04/22/24  7:51 PM   Specimen: Nasal Mucosa; Nasal Swab  Result Value Ref Range   MRSA, PCR NEGATIVE NEGATIVE   Staphylococcus aureus NEGATIVE NEGATIVE    Comment: (NOTE) The Xpert SA Assay (FDA approved for NASAL specimens in patients 87 years of age and older), is one component of a comprehensive surveillance program. It is not intended to diagnose infection nor to guide or monitor treatment. Performed at [facility], [address], [place], [place] [address]   CBC     Status: Abnormal   Collection Time: 04/23/24 10:14 AM  Result Value Ref Range   WBC 12.9 (H) 4.0 - 10.5 K/uL   RBC 3.41 (L) 3.87 - 5.11 MIL/uL   Hemoglobin 10.1 (L) 12.0 - 15.0 g/dL   HCT 68.2 (L) 63.9 - 53.9 %   MCV 93.0 80.0 - 100.0 fL   MCH 29.6 26.0 - 34.0 pg   MCHC 31.9 30.0 - 36.0 g/dL   RDW 85.9 88.4 - 84.4 %   Platelets 519 (H) 150 - 400 K/uL   nRBC 0.2 0.0 - 0.2 %    Comment: Performed at [facility], [address], [place], [place] [address]  Basic metabolic panel     Status: Abnormal   Collection Time: 04/23/24 10:14 AM  Result Value Ref Range   Sodium 139 135 - 145 mmol/L   Potassium 3.4 (L) 3.5 - 5.1 mmol/L   Chloride 102 98 - 111 mmol/L   CO2 28 22 - 32 mmol/L   Glucose, Bld 113 (H) 70 - 99 mg/dL    Comment: Glucose reference range applies only to samples taken after fasting for at least 8 hours.   BUN <5 (L) 6 - 20 mg/dL   Creatinine, Ser 9.27 0.44 - 1.00 mg/dL   Calcium  8.7 (L) 8.9 - 10.3 mg/dL   GFR, Estimated >39 >39 mL/min    Comment: (NOTE) Calculated using the CKD-EPI Creatinine Equation (2021)    Anion gap 9 5 - 15    Comment: Performed at [facility], [address], [place], [place] [address]  Basic metabolic panel with GFR     Status: Abnormal   Collection Time: 04/24/24  3:26 AM  Result Value Ref Range   Sodium 141 135 - 145 mmol/L   Potassium  3.4 (L) 3.5 - 5.1 mmol/L   Chloride 106 98 - 111 mmol/L   CO2 27 22 - 32 mmol/L   Glucose, Bld 111 (H) 70 - 99 mg/dL    Comment: Glucose reference range applies only to samples taken after fasting for at least 8 hours.   BUN 5 (L) 6 - 20 mg/dL   Creatinine, Ser 9.24 0.44 - 1.00 mg/dL   Calcium  8.3 (L) 8.9 - 10.3 mg/dL   GFR, Estimated >39 >39 mL/min    Comment: (NOTE) Calculated using the CKD-EPI Creatinine Equation (2021)    Anion gap 8 5 - 15    Comment: Performed at [facility], [address], [place], [place] [address]  Magnesium   Status: None   Collection Time: 04/24/24  8:48 AM  Result Value Ref Range   Magnesium  2.0 1.7 - 2.4 mg/dL    Comment: Performed at [facility], [address], [place], [place] [address]  CMP (Cancer Center only)     Status: Abnormal   Collection Time: 05/16/24  1:20 PM  Result Value Ref Range   Sodium 137 135 - 145 mmol/L   Potassium 4.5 3.5 - 5.1 mmol/L   Chloride 96 (L) 98 - 111 mmol/L   CO2 26 22 - 32 mmol/L   Glucose, Bld 152 (H) 70 - 99 mg/dL    Comment: Glucose reference range applies only to samples taken after fasting for at least 8 hours.   BUN 7 6 - 20 mg/dL   Creatinine 9.02 9.55 - 1.00 mg/dL   Calcium  10.4 (H) 8.9 - 10.3 mg/dL   Total Protein 8.2 (H) 6.5 - 8.1 g/dL   Albumin  4.1 3.5 - 5.0 g/dL   AST 45 (H) 15 - 41 U/L   ALT 62 (H) 0 - 44 U/L   Alkaline Phosphatase 247 (H) 38 - 126 U/L   Total Bilirubin 0.3 0.0 - 1.2 mg/dL   GFR, Estimated >39 >39 mL/min    Comment: (NOTE) Calculated using the CKD-EPI Creatinine Equation (2021)    Anion gap 15 5 - 15    Comment: Performed at [facility], [address] [place], [place] [address]  CBC with Differential (Cancer Center Only)     Status: Abnormal   Collection Time: 05/16/24  1:20 PM  Result Value Ref Range   WBC Count 14.2 (H) 4.0 - 10.5 K/uL   RBC 3.98 3.87 - 5.11 MIL/uL   Hemoglobin 11.2 (L) 12.0 - 15.0 g/dL   HCT 64.9 (L) 63.9 - 53.9 %    MCV 87.9 80.0 - 100.0 fL   MCH 28.1 26.0 - 34.0 pg   MCHC 32.0 30.0 - 36.0 g/dL   RDW 85.5 88.4 - 84.4 %   Platelet Count 625 (H) 150 - 400 K/uL   nRBC 0.0 0.0 - 0.2 %   Neutrophils Relative % 69 %   Neutro Abs 9.8 (H) 1.7 - 7.7 K/uL   Lymphocytes Relative 15 %   Lymphs Abs 2.1 0.7 - 4.0 K/uL   Monocytes Relative 13 %   Monocytes Absolute 1.8 (H) 0.1 - 1.0 K/uL   Eosinophils Relative 2 %   Eosinophils Absolute 0.3 0.0 - 0.5 K/uL   Basophils Relative 0 %   Basophils Absolute 0.1 0.0 - 0.1 K/uL   Immature Granulocytes 1 %   Abs Immature Granulocytes 0.11 (H) 0.00 - 0.07 K/uL    Comment: Performed at [facility], [address], [place], [place] [address]  CEA (Access)     Status: Abnormal   Collection Time: 05/16/24  1:20 PM  Result Value Ref Range   CEA ([facility]) 10.09 (H) 0.00 - 5.00 ng/mL    Comment: (NOTE) This test was performed using Beckman Coulter's paramagnetic chemiluminescent immunoassay. Values obtained from different assay methods cannot be used interchangeably. Please note that up to 8% of patients who smoke may see values 5.1-10.0 ng/ml and 1% of patients who smoke may see CEA levels >10.0 ng/ml. Performed at [facility], [address] [place], [place] [address]   Glucose, capillary     Status: Abnormal   Collection Time: 05/18/24  9:32 AM  Result Value Ref Range   Glucose-Capillary 184 (H) 70 -  99 mg/dL    Comment: Glucose reference range applies only to samples taken after fasting for at least 8 hours.  CBC with Differential/Platelet     Status: Abnormal   Collection Time: 05/18/24  6:04 PM  Result Value Ref Range   WBC 23.3 (H) 4.0 - 10.5 K/uL   RBC 3.55 (L) 3.87 - 5.11 MIL/uL   Hemoglobin 10.0 (L) 12.0 - 15.0 g/dL   HCT 68.9 (L) 63.9 - 53.9 %   MCV 87.3 80.0 - 100.0 fL   MCH 28.2 26.0 - 34.0 pg   MCHC 32.3 30.0 - 36.0 g/dL   RDW 85.2 88.4 - 84.4 %   Platelets 651 (H) 150 - 400 K/uL   nRBC 0.0 0.0 - 0.2 %    Neutrophils Relative % 75 %   Neutro Abs 17.5 (H) 1.7 - 7.7 K/uL   Lymphocytes Relative 12 %   Lymphs Abs 2.8 0.7 - 4.0 K/uL   Monocytes Relative 11 %   Monocytes Absolute 2.5 (H) 0.1 - 1.0 K/uL   Eosinophils Relative 1 %   Eosinophils Absolute 0.2 0.0 - 0.5 K/uL   Basophils Relative 0 %   Basophils Absolute 0.1 0.0 - 0.1 K/uL   Immature Granulocytes 1 %   Abs Immature Granulocytes 0.33 (H) 0.00 - 0.07 K/uL    Comment: Performed at [facility], [address] [place], [place] [address]  Comprehensive metabolic panel with GFR     Status: Abnormal   Collection Time: 05/18/24  6:04 PM  Result Value Ref Range   Sodium 135 135 - 145 mmol/L   Potassium 3.8 3.5 - 5.1 mmol/L   Chloride 96 (L) 98 - 111 mmol/L   CO2 24 22 - 32 mmol/L   Glucose, Bld 169 (H) 70 - 99 mg/dL    Comment: Glucose reference range applies only to samples taken after fasting for at least 8 hours.   BUN 13 6 - 20 mg/dL   Creatinine, Ser 9.04 0.44 - 1.00 mg/dL   Calcium  9.7 8.9 - 10.3 mg/dL   Total Protein 7.1 6.5 - 8.1 g/dL   Albumin  3.7 3.5 - 5.0 g/dL   AST 45 (H) 15 - 41 U/L   ALT 53 (H) 0 - 44 U/L   Alkaline Phosphatase 225 (H) 38 - 126 U/L   Total Bilirubin 0.2 0.0 - 1.2 mg/dL   GFR, Estimated >39 >39 mL/min    Comment: (NOTE) Calculated using the CKD-EPI Creatinine Equation (2021)    Anion gap 15 5 - 15    Comment: Performed at [facility], [address] [place], [place] [address]  Blood Culture (routine x 2)     Status: None   Collection Time: 05/18/24  7:07 PM   Specimen: BLOOD RIGHT HAND  Result Value Ref Range   Specimen Description      BLOOD RIGHT HAND Performed at [facility], [address] [place], [place] [address]    Special Requests      BOTTLES DRAWN AEROBIC AND ANAEROBIC Blood Culture adequate volume Performed at [facility], [address] [place], [place] [address]    Culture      NO GROWTH 5 DAYS Performed  at [facility], [address] [place], [place] [address]    Report Status 05/24/2024 FINAL   Lactic acid, plasma     Status: None   Collection Time: 05/18/24  8:17 PM  Result Value Ref Range   Lactic Acid,  Venous 1.2 0.5 - 1.9 mmol/L    Comment: Performed at [facility], [address], [place], [place] [address]  Lactic acid, plasma     Status: None   Collection Time: 05/18/24  9:44 PM  Result Value Ref Range   Lactic Acid, Venous 1.5 0.5 - 1.9 mmol/L    Comment: Performed at [facility], [address], [place], [place] [address]  Blood Culture (routine x 2)     Status: None   Collection Time: 05/18/24  9:44 PM   Specimen: BLOOD LEFT HAND  Result Value Ref Range   Specimen Description BLOOD LEFT HAND    Special Requests AEROBIC BOTTLE ONLY Blood Culture adequate volume    Culture      NO GROWTH 5 DAYS Performed at [facility], [address], [place], [place] [address]    Report Status 05/23/2024 FINAL   CBC     Status: Abnormal   Collection Time: 05/20/24 10:46 AM  Result Value Ref Range   WBC 12.1 (H) 4.0 - 10.5 K/uL   RBC 3.75 (L) 3.87 - 5.11 MIL/uL   Hemoglobin 10.5 (L) 12.0 - 15.0 g/dL   HCT 66.8 (L) 63.9 - 53.9 %   MCV 88.3 80.0 - 100.0 fL   MCH 28.0 26.0 - 34.0 pg   MCHC 31.7 30.0 - 36.0 g/dL   RDW 85.3 88.4 - 84.4 %   Platelets 708 (H) 150 - 400 K/uL   nRBC 0.0 0.0 - 0.2 %    Comment: Performed at [facility], [address] [place], [place] [address]  Glucose, capillary     Status: Abnormal   Collection Time: 06/08/24  9:43 AM  Result Value Ref Range   Glucose-Capillary 129 (H) 70 - 99 mg/dL    Comment: Glucose reference range applies only to samples taken after fasting for at least 8 hours.  CEA ([facility] ONLY     Status: Abnormal   Collection Time: 06/12/24  8:36 AM  Result Value Ref Range   CEA ([facility]) 24.92 (H) 0.00 - 5.00 ng/mL    Comment: (NOTE) This test was performed using Beckman Coulter's  paramagnetic chemiluminescent immunoassay. Values obtained from different assay methods cannot be used interchangeably. Please note that up to 8% of patients who smoke may see values 5.1-10.0 ng/ml and 1% of patients who smoke may see CEA levels >10.0 ng/ml. Performed at [facility], [address], [place], [place] [address]   CMP (Cancer Center only)     Status: Abnormal   Collection Time: 06/12/24  8:36 AM  Result Value Ref Range   Sodium 137 135 - 145 mmol/L   Potassium 4.1 3.5 - 5.1 mmol/L    Comment: HEMOLYSIS AT THIS LEVEL MAY AFFECT RESULT   Chloride 99 98 - 111 mmol/L   CO2 23 22 - 32 mmol/L   Glucose, Bld 144 (H) 70 - 99 mg/dL    Comment: Glucose reference range applies only to samples taken after fasting for at least 8 hours.   BUN 9 6 - 20 mg/dL   Creatinine 9.20 9.55 - 1.00 mg/dL   Calcium  10.1 8.9 - 10.3 mg/dL   Total Protein 7.7 6.5 - 8.1 g/dL   Albumin  4.5 3.5 - 5.0 g/dL   AST 26 15 - 41 U/L    Comment: HEMOLYSIS AT THIS LEVEL MAY AFFECT RESULT   ALT 18 0 - 44 U/L   Alkaline Phosphatase 134 (H) 38 - 126  U/L   Total Bilirubin <0.2 0.0 - 1.2 mg/dL   GFR, Estimated >39 >39 mL/min    Comment: (NOTE) Calculated using the CKD-EPI Creatinine Equation (2021)    Anion gap 16 (H) 5 - 15    Comment: Performed at [facility], [address], [place], [place] [address]  CBC with Differential (Cancer Center Only)     Status: Abnormal   Collection Time: 06/12/24  8:36 AM  Result Value Ref Range   WBC Count 6.8 4.0 - 10.5 K/uL   RBC 4.14 3.87 - 5.11 MIL/uL   Hemoglobin 12.1 12.0 - 15.0 g/dL   HCT 63.1 63.9 - 53.9 %   MCV 88.9 80.0 - 100.0 fL   MCH 29.2 26.0 - 34.0 pg   MCHC 32.9 30.0 - 36.0 g/dL   RDW 83.5 (H) 88.4 - 84.4 %   Platelet Count 328 150 - 400 K/uL   nRBC 0.0 0.0 - 0.2 %   Neutrophils Relative % 37 %   Neutro Abs 2.5 1.7 - 7.7 K/uL   Lymphocytes Relative 33 %   Lymphs Abs 2.3 0.7 - 4.0 K/uL   Monocytes Relative 9  %   Monocytes Absolute 0.6 0.1 - 1.0 K/uL   Eosinophils Relative 20 %   Eosinophils Absolute 1.4 (H) 0.0 - 0.5 K/uL   Basophils Relative 1 %   Basophils Absolute 0.1 0.0 - 0.1 K/uL   Immature Granulocytes 0 %   Abs Immature Granulocytes 0.00 0.00 - 0.07 K/uL    Comment: Performed at [facility], [address] [place], [place] [address]  Urine Culture     Status: None   Collection Time: 06/18/24 12:15 PM   Specimen: Urine, Clean Catch  Result Value Ref Range   Specimen Description      URINE, CLEAN CATCH Performed at [facility], [address] [place], [place] [address]    Special Requests      NONE Performed at [facility], [address], [place], [place] [address]    Culture      NO GROWTH Performed at [facility], [address], [place], [place] [address]    Report Status 06/19/2024 FINAL   Urinalysis, Complete w Microscopic     Status: Abnormal   Collection Time: 06/18/24 12:15 PM  Result Value Ref Range   Color, Urine YELLOW YELLOW   APPearance HAZY (A) CLEAR   Specific Gravity, Urine 1.025 1.005 - 1.030   pH 5.5 5.0 - 8.0   Glucose, UA NEGATIVE NEGATIVE mg/dL   Hgb urine dipstick NEGATIVE NEGATIVE   Bilirubin Urine NEGATIVE NEGATIVE   Ketones, ur TRACE (A) NEGATIVE mg/dL   Protein, ur TRACE (A) NEGATIVE mg/dL   Nitrite NEGATIVE NEGATIVE   Leukocytes,Ua NEGATIVE NEGATIVE   RBC / HPF 0-5 0 - 5 RBC/hpf   WBC, UA 0-5 0 - 5 WBC/hpf   Bacteria, UA NONE SEEN NONE SEEN   Squamous Epithelial / HPF 21-50 0 - 5 /HPF   Mucus PRESENT    Hyaline Casts, UA PRESENT     Comment: Performed at [facility], [address] [place], [place] [address]  CMP (Cancer Center only)     Status: Abnormal   Collection Time: 06/18/24 12:40 PM  Result Value Ref Range   Sodium 136 135 - 145 mmol/L   Potassium 3.9 3.5 - 5.1 mmol/L   Chloride 100 98 - 111 mmol/L   CO2 24 22 - 32 mmol/L  Glucose, Bld 114 (H) 70 - 99 mg/dL    Comment: Glucose reference range applies only to samples taken after fasting for at least 8 hours.   BUN 7 6 - 20 mg/dL   Creatinine 9.13 9.55 - 1.00 mg/dL   Calcium  9.6 8.9 - 10.3 mg/dL   Total Protein 7.4 6.5 - 8.1 g/dL   Albumin  4.4 3.5 - 5.0 g/dL   AST 23 15 - 41 U/L   ALT 14 0 - 44 U/L   Alkaline Phosphatase 124 38 - 126 U/L   Total Bilirubin <0.2 0.0 - 1.2 mg/dL   GFR, Estimated >39 >39 mL/min    Comment: (NOTE) Calculated using the CKD-EPI Creatinine Equation (2021)    Anion gap 13 5 - 15    Comment: Performed at [facility], [address] [place], [place] [address]  CBC with Differential (Cancer Center Only)     Status: None   Collection Time: 06/18/24 12:40 PM  Result Value Ref Range   WBC Count 5.5 4.0 - 10.5 K/uL   RBC 4.24 3.87 - 5.11 MIL/uL   Hemoglobin 12.0 12.0 - 15.0 g/dL   HCT 62.8 63.9 - 53.9 %   MCV 87.5 80.0 - 100.0 fL   MCH 28.3 26.0 - 34.0 pg   MCHC 32.3 30.0 - 36.0 g/dL   RDW 84.5 88.4 - 84.4 %   Platelet Count 376 150 - 400 K/uL   nRBC 0.0 0.0 - 0.2 %   Neutrophils Relative % 32 %   Neutro Abs 1.8 1.7 - 7.7 K/uL   Lymphocytes Relative 56 %   Lymphs Abs 3.1 0.7 - 4.0 K/uL   Monocytes Relative 5 %   Monocytes Absolute 0.3 0.1 - 1.0 K/uL   Eosinophils Relative 6 %   Eosinophils Absolute 0.3 0.0 - 0.5 K/uL   Basophils Relative 1 %   Basophils Absolute 0.1 0.0 - 0.1 K/uL   Immature Granulocytes 0 %   Abs Immature Granulocytes 0.01 0.00 - 0.07 K/uL    Comment: Performed at [facility], [address] [place], [place] [address]  CMP (Cancer Center only)     Status: Abnormal   Collection Time: 06/25/24 11:25 AM  Result Value Ref Range   Sodium 142 135 - 145 mmol/L   Potassium 3.6 3.5 - 5.1 mmol/L   Chloride 105 98 - 111 mmol/L   CO2 26 22 - 32 mmol/L   Glucose, Bld 139 (H) 70 - 99 mg/dL    Comment: Glucose reference range applies only to samples taken after fasting for  at least 8 hours.   BUN 13 6 - 20 mg/dL   Creatinine 9.05 9.55 - 1.00 mg/dL   Calcium  10.0 8.9 - 10.3 mg/dL   Total Protein 7.2 6.5 - 8.1 g/dL   Albumin  4.4 3.5 - 5.0 g/dL   AST 19 15 - 41 U/L   ALT 14 0 - 44 U/L   Alkaline Phosphatase 118 38 - 126 U/L   Total Bilirubin <0.2 0.0 - 1.2 mg/dL   GFR, Estimated >39 >39 mL/min    Comment: (NOTE) Calculated using the CKD-EPI Creatinine Equation (2021)    Anion gap 11 5 - 15    Comment: Performed at [facility], [address], [place], [place] [address]  CBC with Differential (Cancer Center Only)     Status: Abnormal   Collection Time: 06/25/24 11:25 AM  Result Value Ref Range   WBC Count 4.3 4.0 - 10.5 K/uL  RBC 4.08 3.87 - 5.11 MIL/uL   Hemoglobin 11.6 (L) 12.0 - 15.0 g/dL   HCT 64.1 (L) 63.9 - 53.9 %   MCV 87.7 80.0 - 100.0 fL   MCH 28.4 26.0 - 34.0 pg   MCHC 32.4 30.0 - 36.0 g/dL   RDW 84.3 (H) 88.4 - 84.4 %   Platelet Count 312 150 - 400 K/uL   nRBC 0.0 0.0 - 0.2 %   Neutrophils Relative % 39 %   Neutro Abs 1.7 1.7 - 7.7 K/uL   Lymphocytes Relative 39 %   Lymphs Abs 1.7 0.7 - 4.0 K/uL   Monocytes Relative 16 %   Monocytes Absolute 0.7 0.1 - 1.0 K/uL   Eosinophils Relative 5 %   Eosinophils Absolute 0.2 0.0 - 0.5 K/uL   Basophils Relative 1 %   Basophils Absolute 0.0 0.0 - 0.1 K/uL   Immature Granulocytes 0 %   Abs Immature Granulocytes 0.01 0.00 - 0.07 K/uL    Comment: Performed at [facility], [address] [place], [place] [address]     Psychiatric Specialty Exam: Physical Exam  Review of Systems  Constitutional:  Positive for activity change and fatigue.  Psychiatric/Behavioral:  Positive for dysphoric mood and sleep disturbance. The patient is nervous/anxious.     Last menstrual period 09/17/2017.There is no height or weight on file to calculate BMI.  General Appearance: Casual  Eye Contact:  Fair  Speech:  Slow  Volume:  Decreased  Mood:  Anxious, Depressed, and  Dysphoric  Affect:  Constricted and Depressed  Thought Process:  Descriptions of Associations: Intact  Orientation:  Full (Time, Place, and Person)  Thought Content:  Rumination  Suicidal Thoughts:  No  Homicidal Thoughts:  No  Memory:  Immediate;   Fair Recent;   Fair Remote;   Fair  Judgement:  Intact  Insight:  Present  Psychomotor Activity:  Decreased  Concentration:  Concentration: Fair and Attention Span: Fair  Recall:  Fair  Fund of Knowledge:  Good  Language:  Good  Akathisia:  No  Handed:  Right  AIMS (if indicated):     Assets:  Communication Skills Desire for Improvement Housing Resilience Social Support Transportation  ADL's:  Intact  Cognition:  WNL  Sleep:  fair       06/25/2024   11:00 AM 05/22/2024   10:29 AM 05/22/2024   10:15 AM 04/30/2024    2:09 PM 03/16/2024   11:32 AM  Depression screen PHQ 2/9  Decreased Interest 1 3 3 3 1   Down, Depressed, Hopeless 1 3 3 3 1   PHQ - 2 Score 2 6 6 6 2   Altered sleeping 1 1 1  0 2  Tired, decreased energy 1 0 0 3 3  Change in appetite 0 1 1 0 3  Feeling bad or failure about yourself  1 3 2  0 0  Trouble concentrating 1 1 1 1  0  Moving slowly or fidgety/restless 1 0 0 0 1  Suicidal thoughts 0 0 0 0 0  PHQ-9 Score 7 12 11 10 11   Difficult doing work/chores    Somewhat difficult     Assessment/Plan: MDD (major depressive disorder), recurrent episode, mild (HCC) - Plan: buPROPion  (WELLBUTRIN  XL) 300 MG 24 hr tablet, sertraline  (ZOLOFT ) 100 MG tablet  GAD (generalized anxiety disorder) - Plan: sertraline  (ZOLOFT ) 100 MG tablet  Primary insomnia - Plan: zolpidem  (AMBIEN  CR) 12.5 MG CR tablet  Patient is 53 year old female with history of GERD, anemia, recently diagnosed with  colon cancer require surgery and now on chemotherapy.  Discussed current depression and anxiety which has increased from the past but patient is hoping once therapy completed and she returned to baseline.  Reassurance given.  Reviewed blood work  results.  She had lost more than 20 pounds in past few months which could be due to health issues.  Discussed current psychotropic medication.  Patient does not want to change since it is helping but she need to focus on her general health and she is getting support from her family member.  She is now seeing a therapist at cancer center.  I agree with the plan.  Will keep the Zoloft  100 mg daily, Ambien  CR 12.5 mg at bedtime and Wellbutrin  XL 300 mg daily.  Encouraged to call back if she feels worsening of the symptoms.  We may adjust the dose if needed.  I also suggest that her new therapist at cancer center can call us  if needed.  Discussed safety concerns at any time having active suicidal thoughts or homicidal thoughts and she need to call 911 or go to local emergency room.  Will follow-up in 3 months.   Follow Up Instructions:     I discussed the assessment and treatment plan with the patient. The patient was provided an opportunity to ask questions and all were answered. The patient agreed with the plan and demonstrated an understanding of the instructions.   The patient was advised to call back or seek an in-person evaluation if the symptoms worsen or if the condition fails to improve as anticipated.    Collaboration of Care: Other provider involved in patient's care AEB notes are available in epic to review  Patient/Guardian was advised Release of Information must be obtained prior to any record release in order to collaborate their care with an outside provider. Patient/Guardian was advised if they have not already done so to contact the registration department to sign all necessary forms in order for us  to release information regarding their care.   Consent: Patient/Guardian gives verbal consent for treatment and assignment of benefits for services provided during this visit. Patient/Guardian expressed understanding and agreed to proceed.     Total encounter time 28 minutes which  includes face-to-face time, chart reviewed, care coordination, order entry and documentation during this encounter.   Note: This document was prepared by [employer] voice dictation technology and any errors that results from this process are unintentional.    Leni ONEIDA Client, MD 07/02/2024

## 2024-07-03 ENCOUNTER — Other Ambulatory Visit: Payer: Self-pay

## 2024-07-08 ENCOUNTER — Other Ambulatory Visit: Payer: Self-pay | Admitting: Oncology

## 2024-07-09 ENCOUNTER — Other Ambulatory Visit: Payer: Self-pay | Admitting: *Deleted

## 2024-07-09 ENCOUNTER — Telehealth: Payer: Self-pay

## 2024-07-09 MED ORDER — MAGIC MOUTHWASH: 5.0000 mL | Freq: Four times a day (QID) | ORAL | 1 refills | Status: AC | PRN

## 2024-07-09 NOTE — Telephone Encounter (Signed)
 Patient called with complaints of a sore mouth/throat that is 6/10 in regards to pain. She stated that this happens after each chemo cycle. She can swallow but it feels like daggers in her mouth/throat. She shared that her tongue was white and was not sure if she had white patches in her mouth. Reviewed her med list. Magic Mouthwash called into her pharmacy. Patient notified that it was being called in for her to pick up. Patient verbalized understanding and appreciative of follow up.

## 2024-07-10 ENCOUNTER — Ambulatory Visit

## 2024-07-10 ENCOUNTER — Other Ambulatory Visit

## 2024-07-12 ENCOUNTER — Inpatient Hospital Stay

## 2024-07-12 ENCOUNTER — Inpatient Hospital Stay (HOSPITAL_BASED_OUTPATIENT_CLINIC_OR_DEPARTMENT_OTHER): Admitting: Oncology

## 2024-07-12 ENCOUNTER — Encounter

## 2024-07-12 VITALS — BP 118/78 | HR 66 | Temp 97.7°F | Resp 17 | Ht 66.0 in | Wt 245.8 lb

## 2024-07-12 DIAGNOSIS — C182 Malignant neoplasm of ascending colon: Secondary | ICD-10-CM | POA: Diagnosis not present

## 2024-07-12 DIAGNOSIS — Z5111 Encounter for antineoplastic chemotherapy: Secondary | ICD-10-CM | POA: Diagnosis not present

## 2024-07-12 LAB — CMP (CANCER CENTER ONLY)
ALT: 22 U/L (ref 0–44)
AST: 32 U/L (ref 15–41)
Albumin: 4.4 g/dL (ref 3.5–5.0)
Alkaline Phosphatase: 135 U/L — ABNORMAL HIGH (ref 38–126)
Anion gap: 12 (ref 5–15)
BUN: 13 mg/dL (ref 6–20)
CO2: 25 mmol/L (ref 22–32)
Calcium: 9.7 mg/dL (ref 8.9–10.3)
Chloride: 101 mmol/L (ref 98–111)
Creatinine: 1.01 mg/dL — ABNORMAL HIGH (ref 0.44–1.00)
GFR, Estimated: >60 mL/min (ref >60)
Glucose, Bld: 149 mg/dL — ABNORMAL HIGH (ref 70–99)
Potassium: 3.8 mmol/L (ref 3.5–5.1)
Sodium: 138 mmol/L (ref 135–145)
Total Bilirubin: 0.3 mg/dL (ref 0.0–1.2)
Total Protein: 7.2 g/dL (ref 6.5–8.1)

## 2024-07-12 LAB — CBC WITH DIFFERENTIAL (CANCER CENTER ONLY)
Abs Immature Granulocytes: 0.01 K/uL (ref 0.00–0.07)
Basophils Absolute: 0.1 K/uL (ref 0.0–0.1)
Basophils Relative: 1 %
Eosinophils Absolute: 0.2 K/uL (ref 0.0–0.5)
Eosinophils Relative: 4 %
HCT: 36.2 % (ref 36.0–46.0)
Hemoglobin: 11.7 g/dL — ABNORMAL LOW (ref 12.0–15.0)
Immature Granulocytes: 0 %
Lymphocytes Relative: 42 %
Lymphs Abs: 1.8 K/uL (ref 0.7–4.0)
MCH: 27.9 pg (ref 26.0–34.0)
MCHC: 32.3 g/dL (ref 30.0–36.0)
MCV: 86.4 fL (ref 80.0–100.0)
Monocytes Absolute: 0.7 K/uL (ref 0.1–1.0)
Monocytes Relative: 15 %
Neutro Abs: 1.7 K/uL (ref 1.7–7.7)
Neutrophils Relative %: 38 %
Platelet Count: 252 K/uL (ref 150–400)
RBC: 4.19 MIL/uL (ref 3.87–5.11)
RDW: 16.2 % — ABNORMAL HIGH (ref 11.5–15.5)
WBC Count: 4.3 K/uL (ref 4.0–10.5)
nRBC: 0 % (ref 0.0–0.2)

## 2024-07-12 LAB — CEA (ACCESS): CEA (CHCC): 14.32 ng/mL — ABNORMAL HIGH (ref 0.00–5.00)

## 2024-07-12 MED ORDER — OXALIPLATIN CHEMO INJECTION 100 MG/20ML
85.0000 mg/m2 | Freq: Once | INTRAVENOUS | Status: AC
  Administered 2024-07-12: 200 mg via INTRAVENOUS
  Filled 2024-07-12: qty 40

## 2024-07-12 MED ORDER — DEXTROSE 5 % IV SOLN: INTRAVENOUS | Status: DC

## 2024-07-12 MED ORDER — FLUOROURACIL CHEMO INJECTION 2.5 GM/50ML
400.0000 mg/m2 | Freq: Once | INTRAVENOUS | Status: AC
  Administered 2024-07-12: 1000 mg via INTRAVENOUS
  Filled 2024-07-12: qty 20

## 2024-07-12 MED ORDER — LEUCOVORIN CALCIUM INJECTION 350 MG
400.0000 mg/m2 | Freq: Once | INTRAVENOUS | Status: AC
  Administered 2024-07-12: 912 mg via INTRAVENOUS
  Filled 2024-07-12: qty 45.6

## 2024-07-12 MED ORDER — LEUCOVORIN CALCIUM INJECTION 350 MG
400.0000 mg/m2 | Freq: Once | INTRAMUSCULAR | Status: DC
  Filled 2024-07-12: qty 45.6

## 2024-07-12 MED ORDER — PALONOSETRON HCL INJECTION 0.25 MG/5ML
0.2500 mg | Freq: Once | INTRAVENOUS | Status: AC
  Administered 2024-07-12: 0.25 mg via INTRAVENOUS
  Filled 2024-07-12: qty 5

## 2024-07-12 MED ORDER — DEXAMETHASONE SODIUM PHOSPHATE 10 MG/ML IJ SOLN
10.0000 mg | Freq: Once | INTRAMUSCULAR | Status: AC
  Administered 2024-07-12: 10 mg via INTRAVENOUS
  Filled 2024-07-12: qty 1

## 2024-07-12 MED ORDER — SODIUM CHLORIDE 0.9 % IV SOLN
2400.0000 mg/m2 | INTRAVENOUS | Status: DC
  Administered 2024-07-12: 5000 mg via INTRAVENOUS
  Filled 2024-07-12: qty 100

## 2024-07-12 NOTE — Progress Notes (Signed)
 Patient seen by Dr. Thornton Papas today  Vitals are within treatment parameters:Yes   Labs are within treatment parameters: Yes   Treatment plan has been signed: Yes   Per physician team, Patient is ready for treatment and there are NO modifications to the treatment plan.

## 2024-07-12 NOTE — Patient Instructions (Signed)
 [facility] - A DEPT OF [facility]  Discharge Instructions: Thank you for choosing [facility] to provide your oncology and hematology care.   If you have a lab appointment with the Cancer Center, please go directly to the Cancer Center and check in at the registration area.   Wear comfortable clothing and clothing appropriate for easy access to any Portacath or PICC line.   We strive to give you quality time with your provider. You may need to reschedule your appointment if you arrive late (15 or more minutes).  Arriving late affects you and other patients whose appointments are after yours.  Also, if you miss three or more appointments without notifying the office, you may be dismissed from the clinic at the provider's discretion.      For prescription refill requests, have your pharmacy contact our office and allow 72 hours for refills to be completed.    Today you received the following chemotherapy and/or immunotherapy agents: Oxaliplatin , Leucovorin , Fluorouracil  (5-FU)       To help prevent nausea and vomiting after your treatment, we encourage you to take your nausea medication as directed.  BELOW ARE SYMPTOMS THAT SHOULD BE REPORTED IMMEDIATELY: *FEVER GREATER THAN 100.4 F (38 C) OR HIGHER *CHILLS OR SWEATING *NAUSEA AND VOMITING THAT IS NOT CONTROLLED WITH YOUR NAUSEA MEDICATION *UNUSUAL SHORTNESS OF BREATH *UNUSUAL BRUISING OR BLEEDING *URINARY PROBLEMS (pain or burning when urinating, or frequent urination) *BOWEL PROBLEMS (unusual diarrhea, constipation, pain near the anus) TENDERNESS IN MOUTH AND THROAT WITH OR WITHOUT PRESENCE OF ULCERS (sore throat, sores in mouth, or a toothache) UNUSUAL RASH, SWELLING OR PAIN  UNUSUAL VAGINAL DISCHARGE OR ITCHING   Items with * indicate a potential emergency and should be followed up as soon as possible or go to the Emergency Department if any problems should occur.  Please show the  CHEMOTHERAPY ALERT CARD or IMMUNOTHERAPY ALERT CARD at check-in to the Emergency Department and triage nurse.  Should you have questions after your visit or need to cancel or reschedule your appointment, please contact [facility] - A DEPT OF [facility]  Dept: [phone]  and follow the prompts.  Office hours are 8:00 a.m. to 4:30 p.m. Monday - Friday. Please note that voicemails left after 4:00 p.m. may not be returned until the following business day.  We are closed weekends and major holidays. You have access to a nurse at all times for urgent questions. Please call the main number to the clinic Dept: [phone] and follow the prompts.   For any non-urgent questions, you may also contact your provider using MyChart. We now offer e-Visits for anyone 49 and older to request care online for non-urgent symptoms. For details visit [email or web address].   Also download the MyChart app! Go to the app store, search MyChart, open the app, select [place], and log in with your MyChart username and password.

## 2024-07-12 NOTE — Progress Notes (Signed)
 [facility] OFFICE PROGRESS NOTE   Diagnosis: Colon cancer  INTERVAL HISTORY:   Erin Bradford completed another cycle of FOLFOX on 06/25/2024.  She had nausea on the evening following chemotherapy.  No emesis.  The nausea was relieved with antiemetics.  She reports cold sensitivity for 1 week following chemotherapy.  No peripheral numbness.  She had difficulty swallowing and soreness in the throat during the week following chemotherapy.  She continues to have swelling at the left neck and arm.  She had discomfort at the soles.  Objective:  Vital signs in last 24 hours:  Blood pressure 118/78, pulse 66, temperature 97.7 F (36.5 C), temperature source Temporal, resp. rate 17, height 5' 6 (1.676 m), weight 245 lb 12.8 oz (111.5 kg), last menstrual period 09/17/2017, SpO2 100%.    HEENT: No thrush or ulcers.  Pharynx without erythema or exudate.  Mild soft tissue fullness in the left greater than right lower neck/supraclavicular fossa Lymphatics: Resp: Lungs clear bilaterally Cardio: Regular rate and rhythm GI: No hepatosplenomegaly, healed midline incision Vascular: Leg edema, the left lower arm appears slightly larger than the right side, no erythema  Skin: Mild hyperpigmentation at the lower neck and palms, dryness of the soles  Portacath/PICC-without erythema  Lab Results:  Lab Results  Component Value Date   WBC 4.3 07/12/2024   HGB 11.7 (L) 07/12/2024   HCT 36.2 07/12/2024   MCV 86.4 07/12/2024   PLT 252 07/12/2024   NEUTROABS 1.7 07/12/2024    CMP  Lab Results  Component Value Date   NA 138 07/12/2024   K 3.8 07/12/2024   CL 101 07/12/2024   CO2 25 07/12/2024   GLUCOSE 149 (H) 07/12/2024   BUN 13 07/12/2024   CREATININE 1.01 (H) 07/12/2024   CALCIUM  9.7 07/12/2024   PROT 7.2 07/12/2024   ALBUMIN  4.4 07/12/2024   AST 32 07/12/2024   ALT 22 07/12/2024   ALKPHOS 135 (H) 07/12/2024   BILITOT 0.3 07/12/2024   GFRNONAA >60 07/12/2024   GFRAA >60  07/19/2018    Lab Results  Component Value Date   CEA 14.32 (H) 07/12/2024    Medications: I have reviewed the patient's current medications.   Assessment/Plan: Colon cancer, cecum, stage IIIb (pT4a, PN1a), Ileocecectomy 04/12/2024-moderately differentiated adenocarcinoma of the cecum, no macroscopic tumor perforation, carcinoma invades into the serosal surface, no lymphovascular perineural invasion, negative resection margins, 1/3 nodes, 1 tumor deposit, normal mismatch repair protein expression, microsatellite stable CT abdomen/pelvis 04/11/2024: Dilated appendix with extensive periappendiceal inflammatory changes and a 5 cm fluid collection, no enlarged abdominal pelvic lymph nodes, unchanged 9 mm right adrenal nodule from 2018 CT chest 04/21/2024: 1.4 cm anterior mediastinal nodule or node, bilateral tiny nonspecific pulmonary nodules PET scan 06/08/2024-developing metastatic lesions right hemiabdominal mesenteric lymph nodes as well as at least 2 liver metastases.  Anterior superior mediastinal soft tissue nodule is similar to the previous chest CT, no abnormal uptake. Cycle 1 FOLFOX 06/12/2024 MRI liver 06/19/2024-numerous small rim-enhancing hypovascular masses seen throughout the liver.  Right lower quadrant mesenteric lymphadenopathy measuring 1.8 cm. Cycle 2 FOLFOX 06/25/2024 Cycle 3 FOLFOX 07/12/2024 Depression Urinary frequency Hypertension Tremors Tachycardia Asthma Family history of breast and colon cancer Colonoscopy-needs colonoscopy approximately 6 months from diagnosis     Disposition: Ms.  Bradford has metastatic colon cancer.  She has completed 2 cycles of FOLFOX.  She had mild nausea following the last cycle of chemotherapy, relieved with antiemetics.  No neuropathy symptoms at present.  She has a borderline  low neutrophil count today.  I recommend adding G-CSF.  We reviewed potential toxicities associated with G-CSF including the chance for bone pain, rash, and splenic  rupture.  She agrees to proceed.  She will complete cycle 3 FOLFOX today.  She will return for an office visit and chemotherapy in 2 weeks.  There is persistent mild swelling of the left neck and arm.  She is scheduled to see Dr. Curvin next week to evaluate the neck and arm swelling.  She will return for an office visit and chemotherapy in 2 weeks.  Arley Hof, MD  07/12/2024  9:30 AM

## 2024-07-13 ENCOUNTER — Inpatient Hospital Stay

## 2024-07-13 ENCOUNTER — Encounter: Payer: Self-pay | Admitting: Oncology

## 2024-07-13 NOTE — Progress Notes (Signed)
 [facility] Clinical Social Work  Clinical Social Work was referred by nurse for emotional support.  Clinical Social Worker contacted patient by phone to offer support and assess for needs.     Interventions: CSW provided education on clinical services offered through the Cancer Center. CSW assessed presenting concerns and reason for initiation of services.      Follow Up Plan:  Initial appointment for counseling services scheduled for 7/24    Lizbeth Sprague, LCSW  Clinical Social Worker [facility]

## 2024-07-14 ENCOUNTER — Inpatient Hospital Stay

## 2024-07-14 VITALS — BP 104/76 | HR 61 | Temp 97.2°F | Resp 17

## 2024-07-14 DIAGNOSIS — Z5111 Encounter for antineoplastic chemotherapy: Secondary | ICD-10-CM | POA: Diagnosis not present

## 2024-07-14 DIAGNOSIS — C182 Malignant neoplasm of ascending colon: Secondary | ICD-10-CM

## 2024-07-14 MED ORDER — SODIUM CHLORIDE 0.9% FLUSH
10.0000 mL | INTRAVENOUS | Status: DC | PRN
  Administered 2024-07-14: 10 mL

## 2024-07-14 MED ORDER — HEPARIN SOD (PORK) LOCK FLUSH 100 UNIT/ML IV SOLN
500.0000 [IU] | Freq: Once | INTRAVENOUS | Status: AC | PRN
  Administered 2024-07-14: 500 [IU]

## 2024-07-14 MED ORDER — PEGFILGRASTIM-CBQV 6 MG/0.6ML ~~LOC~~ SOSY
6.0000 mg | PREFILLED_SYRINGE | Freq: Once | SUBCUTANEOUS | Status: AC
  Administered 2024-07-14: 6 mg via SUBCUTANEOUS

## 2024-07-14 NOTE — Patient Instructions (Signed)

## 2024-07-16 NOTE — Progress Notes (Signed)
 PROVIDER:  DEWARD GARNETTE NULL, MD  MRN: [id number] DOB: [birth year] DATE OF ENCOUNTER: 07/16/2024 Subjective      Chief Complaint: Post Operative Visit     History of Present Illness: Erin Bradford is a 53 y.o. female who is seen today for right colon cancer.  The patient is a 53 year old white female who is about 3 months status post ileocecectomy for what turned out to be a stage IIIb right colon cancer.  She recently had a Port-A-Cath placed and has started chemotherapy.  Since that time she has had some swelling of the left arm and neck area.  Her CT scans have not showed any complication associated with the port.  Her scans also showed evidence of metastatic disease in the colon mesentery and liver.    Review of Systems: A complete review of systems was obtained from the patient.  I have reviewed this information and discussed as appropriate with the patient.  See HPI as well for other ROS.  ROS    Medical History: Past Medical History:  Diagnosis Date  . Anemia   . Anxiety   . Arthritis   . Asthma, unspecified asthma severity, unspecified whether complicated, unspecified whether persistent (HHS-HCC)   . Chronic kidney disease   . Depression   . History of cancer   . Hypertension   . Insomnia   . Obesity, morbid, BMI 40.0-49.9 (CMS/HHS-HCC)   . Prediabetes   . Sedative hypnotic or anxiolytic dependence (CMS/HHS-HCC)   . Tremor     Patient Active Problem List  Diagnosis  . Bilateral primary osteoarthritis of knee  . Primary osteoarthritis of both knees  . Cancer of right colon (CMS/HHS-HCC)    Past Surgical History:  Procedure Laterality Date  . HYSTERECTOMY  09/2017  . APPENDECTOMY       Allergies  Allergen Reactions  . Ace Inhibitors Anaphylaxis    Current Outpatient Medications on File Prior to Visit  Medication Sig Dispense Refill  . ACCU-CHEK AVIVA PLUS TEST STRP test strip USE TO CHECK BLOOD SUGAR TWICE WEEKLY  11  . acetaminophen  (TYLENOL ) 500  MG tablet Take 1,000 mg by mouth every 6 (six) hours    . albuterol  (PROVENTIL ) 2.5 mg /3 mL (0.083 %) nebulizer solution USE 1 VIAL VIA NEBULIZER EVERY 6 HOURS AS NEEDED FOR WHEEZING OR  SHORTNESS OF BREATH    . amoxicillin -clavulanate (AUGMENTIN ) 875-125 mg tablet Take 1 tablet by mouth 2 (two) times daily    . buPROPion  (WELLBUTRIN  XL) 300 MG XL tablet TK 1 T PO  D  11  . carvedilol  (COREG ) 12.5 MG tablet Take 18.75 mg by mouth 2 (two) times daily with meals    3  . cetirizine  (ZYRTEC ) 10 MG tablet Take 10 mg by mouth once daily as needed for Allergies    . eszopiclone  (LUNESTA ) 3 mg tablet Take 3 mg by mouth nightly as needed for Sleep Take immediately before bedtime.    . famotidine  (PEPCID ) 20 MG tablet Take 20 mg by mouth 2 (two) times daily    . fluconazole  (DIFLUCAN ) 150 MG tablet TK 1 T PO NOW. IF SYMPTOMS STILL PRESENT TAKE 3 DAYS LATER    . fluticasone  propionate (FLONASE ) 50 mcg/actuation nasal spray Place 2 sprays into both nostrils once daily    . FUROsemide  (LASIX ) 20 MG tablet Take 20 mg by mouth 2 (two) times daily      . gabapentin  (NEURONTIN ) 300 MG capsule Take 300 mg by mouth once daily    .  HYDROcodone-acetaminophen  (NORCO) 10-325 mg tablet Take 1 tablet by mouth every 6 (six) hours as needed for Pain      . lamoTRIgine  (LAMICTAL ) 25 MG tablet Take 2 tablets twice a day by oral route.    . losartan  (COZAAR ) 100 MG tablet TK 1 T PO  QD  11  . methocarbamol  (ROBAXIN ) 750 MG tablet Take 750 mg by mouth 3 (three) times daily as needed    . oxyBUTYnin  (DITROPAN -XL) 10 MG XL tablet Take 1 tablet by mouth at bedtime    . oxyCODONE -acetaminophen  (PERCOCET) 10-325 mg tablet Take 1 tablet by mouth 3 (three) times daily as needed    . peg-electrolyte (NULYTELY ) solution U UTD    . pegfilgrastim  (NEULASTA ) 6 mg/0.6 mL injection Inject 6 mg subcutaneously once    . phentermine (ADIPEX-P) 37.5 MG capsule Take 37.5 mg by mouth every morning before breakfast    . primidone  (MYSOLINE ) 50  MG tablet Take 300 mg by mouth nightly    2  . sertraline  (ZOLOFT ) 50 MG tablet TK 1 T PO  QD  5  . spironolactone  (ALDACTONE ) 50 MG tablet TK 1 T PO BID  5  . SYMBICORT  80-4.5 mcg/actuation inhaler USE 2 INHALATIONS BY MOUTH TWICE DAILY    . tiZANidine  (ZANAFLEX ) 4 MG tablet Take 4 mg by mouth    . VENTOLIN  HFA 90 mcg/actuation inhaler USE 2 INHALATIONS BY MOUTH EVERY 6 HOURS AS NEEDED FOR WHEEZING  OR SHORTNESS OF BREATH    . zolpidem  (AMBIEN  CR) 12.5 MG CR tablet Take 12.5 mg by mouth at bedtime as needed     No current facility-administered medications on file prior to visit.    Family History  Problem Relation Age of Onset  . High blood pressure (Hypertension) Mother   . Hyperlipidemia (Elevated cholesterol) Mother   . Diabetes Mother   . Lupus Father      Social History   Tobacco Use  Smoking Status Never  Smokeless Tobacco Never     Social History   Socioeconomic History  . Marital status: Widowed  Tobacco Use  . Smoking status: Never  . Smokeless tobacco: Never  Vaping Use  . Vaping status: Never Used  Substance and Sexual Activity  . Alcohol  use: Not Currently  . Drug use: Never   Social Drivers of Corporate investment banker Strain: High Risk (04/26/2024)   Received from [facility]   Overall Financial Resource Strain (CARDIA)   . Difficulty of Paying Living Expenses: Very hard  Food Insecurity: No Food Insecurity (05/18/2024)   Received from [facility]   Hunger Vital Sign   . Within the past 12 months, you worried that your food would run out before you got the money to buy more.: Never true   . Within the past 12 months, the food you bought just didn't last and you didn't have money to get more.: Never true  Recent Concern: Food Insecurity - Food Insecurity Present (05/02/2024)   Received from [facility]   Hunger Vital Sign   . Worried About Programme researcher, broadcasting/film/video in the Last Year: Sometimes true   . Ran Out of Food in the Last Year: Sometimes true   Transportation Needs: No Transportation Needs (05/18/2024)   Received from [facility] - Transportation   . Lack of Transportation (Medical): No   . Lack of Transportation (Non-Medical): No  Physical Activity: Unknown (04/26/2024)   Received from [facility]   Exercise Vital Sign   .  On average, how many days per week do you engage in moderate to strenuous exercise (like a brisk walk)?: 0 days  Stress: Stress Concern Present (04/26/2024)   Received from [facility] of Occupational [facility] - Occupational Stress Questionnaire   . Feeling of Stress : Very much  Social Connections: Patient Declined (04/26/2024)   [facility]   Social Network   . How would you rate your social network (family, work, friends)?: Patient declined  Housing Stability: High Risk (04/30/2024)   Received from [facility] Stability Vital Sign   . Unable to Pay for Housing in the Last Year: Yes   . Number of Times Moved in the Last Year: 0   . Homeless in the Last Year: No    Objective:    Vitals:   07/16/24 1429  PainSc: 0-No pain    There is no height or weight on file to calculate BMI.  Physical Exam Vitals reviewed.  Constitutional:      General: She is not in acute distress.    Appearance: Normal appearance.  HENT:     Head: Normocephalic and atraumatic.     Right Ear: External ear normal.     Left Ear: External ear normal.     Nose: Nose normal.     Mouth/Throat:     Mouth: Mucous membranes are moist.     Pharynx: Oropharynx is clear.  Eyes:     General: No scleral icterus.    Extraocular Movements: Extraocular movements intact.     Conjunctiva/sclera: Conjunctivae normal.     Pupils: Pupils are equal, round, and reactive to light.  Cardiovascular:     Rate and Rhythm: Normal rate and regular rhythm.     Pulses: Normal pulses.     Heart sounds: Normal heart sounds.  Pulmonary:     Effort: Pulmonary effort is normal. No respiratory  distress.     Breath sounds: Normal breath sounds.  Abdominal:     General: Bowel sounds are normal.     Palpations: Abdomen is soft.     Tenderness: There is no abdominal tenderness.     Comments: The midline incision has healed  Musculoskeletal:        General: No swelling, tenderness or deformity. Normal range of motion.     Cervical back: Normal range of motion and neck supple.  Skin:    General: Skin is warm and dry.     Coloration: Skin is not jaundiced.  Neurological:     General: No focal deficit present.     Mental Status: She is alert and oriented to person, place, and time.  Psychiatric:        Mood and Affect: Mood normal.        Behavior: Behavior normal.         Labs, Imaging and Diagnostic Testing:     Assessment and Plan:     Diagnoses and all orders for this visit:  Cancer of right colon (CMS/HHS-HCC)     The patient is about 3 months status post ileocecectomy for a right colon cancer and recently had a Port-A-Cath placed.  She will continue with chemotherapy for stage IV colon cancer.  I cannot explain the swelling of the left arm and neck.  It does not appear that she has clot associated with her catheter.  We can move it to a different site but she would likely develop similar swelling.  This would also delay  her treatment.  Her treatment will continue to be directed by oncology.  I will plan to see her back in in about 3 months  Return in about 3 months (around 10/16/2024).   DEWARD GARNETTE NULL, MD    I had direct face-to-face contact with the patient for a total of 20 minutes and greater than 50% of that time was spent providing counseling and/or coordination of care for the patient regarding right colon cancer.

## 2024-07-19 ENCOUNTER — Inpatient Hospital Stay

## 2024-07-21 ENCOUNTER — Other Ambulatory Visit: Payer: Self-pay | Admitting: Oncology

## 2024-07-21 DIAGNOSIS — C182 Malignant neoplasm of ascending colon: Secondary | ICD-10-CM

## 2024-07-23 ENCOUNTER — Telehealth: Payer: Self-pay | Admitting: *Deleted

## 2024-07-23 NOTE — Telephone Encounter (Signed)
 Called to report back spasms/pain beginning 3 days after her last chemotherapy. Starts in lower lumbar/sacral area and radiates up her back and sometimes causes her chest to hurt. Is taking her oxycodone , gabapentin  and tizanidine  as prescribed saying it is not helping. She reports she has pain management physician for chronic back pain. Suggested she reach out to that provider since FOLFOX does not cause back spasms. Will make MD aware of her call and she agrees to call pain management.

## 2024-07-24 ENCOUNTER — Telehealth: Payer: Self-pay

## 2024-07-24 NOTE — Telephone Encounter (Signed)
 [facility] CSW Progress Note  Clinical Social Worker called patient on this date to reschedule Initial Counseling Appointment. Last appointment was scheduled for 7/25 - patient called same day to cancel appointment.   Appointment has been rescheduled for 8/21   Lizbeth Sprague, LCSW Clinical Social Worker [facility]

## 2024-07-25 ENCOUNTER — Encounter: Payer: Self-pay | Admitting: Neurology

## 2024-07-26 ENCOUNTER — Inpatient Hospital Stay

## 2024-07-26 ENCOUNTER — Inpatient Hospital Stay (HOSPITAL_BASED_OUTPATIENT_CLINIC_OR_DEPARTMENT_OTHER): Admitting: Nurse Practitioner

## 2024-07-26 ENCOUNTER — Encounter: Payer: Self-pay | Admitting: Nurse Practitioner

## 2024-07-26 VITALS — BP 108/75 | HR 64 | Temp 97.8°F | Resp 16 | Ht 66.0 in | Wt 247.2 lb

## 2024-07-26 DIAGNOSIS — C182 Malignant neoplasm of ascending colon: Secondary | ICD-10-CM

## 2024-07-26 DIAGNOSIS — Z5111 Encounter for antineoplastic chemotherapy: Secondary | ICD-10-CM | POA: Diagnosis not present

## 2024-07-26 DIAGNOSIS — Z95828 Presence of other vascular implants and grafts: Secondary | ICD-10-CM

## 2024-07-26 LAB — CBC WITH DIFFERENTIAL (CANCER CENTER ONLY)
Abs Immature Granulocytes: 1.39 K/uL — ABNORMAL HIGH (ref 0.00–0.07)
Basophils Absolute: 0.2 K/uL — ABNORMAL HIGH (ref 0.0–0.1)
Basophils Relative: 1 %
Eosinophils Absolute: 0.2 K/uL (ref 0.0–0.5)
Eosinophils Relative: 2 %
HCT: 35.9 % — ABNORMAL LOW (ref 36.0–46.0)
Hemoglobin: 11.8 g/dL — ABNORMAL LOW (ref 12.0–15.0)
Immature Granulocytes: 10 %
Lymphocytes Relative: 24 %
Lymphs Abs: 3.3 K/uL (ref 0.7–4.0)
MCH: 28.4 pg (ref 26.0–34.0)
MCHC: 32.9 g/dL (ref 30.0–36.0)
MCV: 86.5 fL (ref 80.0–100.0)
Monocytes Absolute: 1.1 K/uL — ABNORMAL HIGH (ref 0.1–1.0)
Monocytes Relative: 8 %
Neutro Abs: 7.7 K/uL (ref 1.7–7.7)
Neutrophils Relative %: 55 %
Platelet Count: 93 K/uL — ABNORMAL LOW (ref 150–400)
RBC: 4.15 MIL/uL (ref 3.87–5.11)
RDW: 17.2 % — ABNORMAL HIGH (ref 11.5–15.5)
WBC Count: 13.8 K/uL — ABNORMAL HIGH (ref 4.0–10.5)
nRBC: 0.8 % — ABNORMAL HIGH (ref 0.0–0.2)

## 2024-07-26 LAB — CMP (CANCER CENTER ONLY)
ALT: 28 U/L (ref 0–44)
AST: 38 U/L (ref 15–41)
Albumin: 4.3 g/dL (ref 3.5–5.0)
Alkaline Phosphatase: 188 U/L — ABNORMAL HIGH (ref 38–126)
Anion gap: 12 (ref 5–15)
BUN: 7 mg/dL (ref 6–20)
CO2: 27 mmol/L (ref 22–32)
Calcium: 9.9 mg/dL (ref 8.9–10.3)
Chloride: 100 mmol/L (ref 98–111)
Creatinine: 1.05 mg/dL — ABNORMAL HIGH (ref 0.44–1.00)
GFR, Estimated: >60 mL/min (ref >60)
Glucose, Bld: 151 mg/dL — ABNORMAL HIGH (ref 70–99)
Potassium: 3.8 mmol/L (ref 3.5–5.1)
Sodium: 139 mmol/L (ref 135–145)
Total Bilirubin: 0.3 mg/dL (ref 0.0–1.2)
Total Protein: 7 g/dL (ref 6.5–8.1)

## 2024-07-26 LAB — CEA (ACCESS): CEA (CHCC): 5.98 ng/mL — ABNORMAL HIGH (ref 0.00–5.00)

## 2024-07-26 MED ORDER — LEUCOVORIN CALCIUM INJECTION 350 MG
400.0000 mg/m2 | Freq: Once | INTRAVENOUS | Status: AC
  Administered 2024-07-26: 912 mg via INTRAVENOUS
  Filled 2024-07-26: qty 45.6

## 2024-07-26 MED ORDER — SODIUM CHLORIDE 0.9% FLUSH
10.0000 mL | Freq: Once | INTRAVENOUS | Status: AC
  Administered 2024-07-26: 10 mL via INTRAVENOUS

## 2024-07-26 MED ORDER — SODIUM CHLORIDE 0.9 % IV SOLN
2400.0000 mg/m2 | INTRAVENOUS | Status: AC
  Administered 2024-07-26: 5000 mg via INTRAVENOUS
  Filled 2024-07-26: qty 100

## 2024-07-26 MED ORDER — DEXAMETHASONE 4 MG PO TABS: 4.0000 mg | ORAL_TABLET | Freq: Two times a day (BID) | ORAL | 4 refills | Status: DC

## 2024-07-26 MED ORDER — PROCHLORPERAZINE MALEATE 10 MG PO TABS: 10.0000 mg | ORAL_TABLET | Freq: Four times a day (QID) | ORAL | 3 refills | Status: DC | PRN

## 2024-07-26 MED ORDER — FLUOROURACIL CHEMO INJECTION 2.5 GM/50ML
400.0000 mg/m2 | Freq: Once | INTRAVENOUS | Status: AC
  Administered 2024-07-26: 1000 mg via INTRAVENOUS
  Filled 2024-07-26: qty 20

## 2024-07-26 MED ORDER — OXALIPLATIN CHEMO INJECTION 100 MG/20ML
65.0000 mg/m2 | Freq: Once | INTRAVENOUS | Status: AC
  Administered 2024-07-26: 150 mg via INTRAVENOUS
  Filled 2024-07-26: qty 20

## 2024-07-26 MED ORDER — DEXAMETHASONE SODIUM PHOSPHATE 10 MG/ML IJ SOLN
10.0000 mg | Freq: Once | INTRAMUSCULAR | Status: AC
  Administered 2024-07-26: 10 mg via INTRAVENOUS
  Filled 2024-07-26: qty 1

## 2024-07-26 MED ORDER — ONDANSETRON HCL 8 MG PO TABS: 8.0000 mg | ORAL_TABLET | Freq: Three times a day (TID) | ORAL | 3 refills | Status: DC | PRN

## 2024-07-26 MED ORDER — PALONOSETRON HCL INJECTION 0.25 MG/5ML
0.2500 mg | Freq: Once | INTRAVENOUS | Status: AC
  Administered 2024-07-26: 0.25 mg via INTRAVENOUS
  Filled 2024-07-26: qty 5

## 2024-07-26 MED ORDER — DEXTROSE 5 % IV SOLN: INTRAVENOUS | Status: DC

## 2024-07-26 NOTE — Progress Notes (Signed)
 [facility] OFFICE PROGRESS NOTE   Diagnosis: Colon cancer  INTERVAL HISTORY:   Ms. Gaetano returns as scheduled.  She completed cycle 3 FOLFOX 07/12/2024.  She developed nausea around day 3.  No vomiting.  The nausea lasted intermittently for 7 to 10 days.  Zofran  and Compazine  effective.  No mouth sores.  No diarrhea.  She has mild persistent cold sensitivity.  No numbness or tingling in the absence of cold exposure.  She notes she is intermittently hoarse.  She complains of generalized achiness.  She continues to note swelling at the left neck and arm.  Objective:  Vital signs in last 24 hours:  Blood pressure 108/75, pulse 64, temperature 97.8 F (36.6 C), temperature source Temporal, resp. rate 16, height 5' 6 (1.676 m), weight 247 lb 3.2 oz (112.1 kg), last menstrual period 09/17/2017, SpO2 98%.    HEENT: No thrush or ulcers. Resp: Lungs clear bilaterally. Cardio: Regular rate and rhythm. GI: No hepatosplenomegaly.  Midline incision appears healed with the exception of a small scabbed area near the center of the scar. Vascular: No leg edema. Neuro: Vibratory sense intact over the fingertips per tuning fork exam. Skin: Palms without erythema. Port-A-Cath without erythema.  Lab Results:  Lab Results  Component Value Date   WBC 13.8 (H) 07/26/2024   HGB 11.8 (L) 07/26/2024   HCT 35.9 (L) 07/26/2024   MCV 86.5 07/26/2024   PLT 93 (L) 07/26/2024   NEUTROABS 7.7 07/26/2024    Imaging:  No results found.  Medications: I have reviewed the patient's current medications.  Assessment/Plan: Colon cancer, cecum, stage IIIb (pT4a, PN1a), Ileocecectomy 04/12/2024-moderately differentiated adenocarcinoma of the cecum, no macroscopic tumor perforation, carcinoma invades into the serosal surface, no lymphovascular perineural invasion, negative resection margins, 1/3 nodes, 1 tumor deposit, normal mismatch repair protein expression, microsatellite stable CT  abdomen/pelvis 04/11/2024: Dilated appendix with extensive periappendiceal inflammatory changes and a 5 cm fluid collection, no enlarged abdominal pelvic lymph nodes, unchanged 9 mm right adrenal nodule from 2018 CT chest 04/21/2024: 1.4 cm anterior mediastinal nodule or node, bilateral tiny nonspecific pulmonary nodules PET scan 06/08/2024-developing metastatic lesions right hemiabdominal mesenteric lymph nodes as well as at least 2 liver metastases.  Anterior superior mediastinal soft tissue nodule is similar to the previous chest CT, no abnormal uptake. Cycle 1 FOLFOX 06/12/2024 MRI liver 06/19/2024-numerous small rim-enhancing hypovascular masses seen throughout the liver.  Right lower quadrant mesenteric lymphadenopathy measuring 1.8 cm. Cycle 2 FOLFOX 06/25/2024 Cycle 3 FOLFOX 07/12/2024 Cycle 4 FOLFOX 07/26/2024, oxaliplatin  dose reduced due to persistent cold sensitivity and mild thrombocytopenia Depression Urinary frequency Hypertension Tremors Tachycardia Asthma Family history of breast and colon cancer Colonoscopy-needs colonoscopy approximately 6 months from diagnosis  Disposition: Ms. Trivett appears stable.  She has completed 3 cycles of FOLFOX.  She had delayed nausea after cycle 3.  She will begin dexamethasone  4 mg twice daily on day of pump discontinuation x 2 days.  We reviewed potential side effects associated with steroids.  She will monitor her blood sugar at home and contact the office with persistent elevated readings.  She will take Zofran  and Compazine  as needed.  She understands to contact the office if the nausea is not controlled with these measures.  She has mild persistent cold sensitivity.  She has mild thrombocytopenia.  Oxaliplatin  will be dose reduced with today's treatment.  Plan to proceed with cycle 4 today as scheduled.  CBC and chemistry panel reviewed.  Labs adequate for treatment.  Oxaliplatin  dose reduced  due to mild thrombocytopenia.  She understands to contact  the office with bleeding.  CEA is lower.  She has intermittent hoarseness, etiology unclear.  We discussed the possibility of the hoarseness being related to chemotherapy.  Referral placed to ENT for evaluation.  She will return for follow-up and treatment as scheduled in 2 weeks.  We are available to see her sooner if needed.    Olam Ned ANP/GNP-BC   07/26/2024  11:46 AM

## 2024-07-26 NOTE — Progress Notes (Signed)
 Patient seen by Olam Ned NP today  Vitals are within treatment parameters:Yes   Labs are within treatment parameters: No (Please specify and give further instructions.) Ptls 93  Treatment plan has been signed: Yes   Per physician team, Patient is ready for treatment. Please note the following modifications: dose reduced the oxaliplatin 

## 2024-07-26 NOTE — Patient Instructions (Signed)

## 2024-07-26 NOTE — Patient Instructions (Signed)
 [facility] - A DEPT OF [facility]  Discharge Instructions: Thank you for choosing [facility] to provide your oncology and hematology care.   If you have a lab appointment with the Cancer Center, please go directly to the Cancer Center and check in at the registration area.   Wear comfortable clothing and clothing appropriate for easy access to any Portacath or PICC line.   We strive to give you quality time with your provider. You may need to reschedule your appointment if you arrive late (15 or more minutes).  Arriving late affects you and other patients whose appointments are after yours.  Also, if you miss three or more appointments without notifying the office, you may be dismissed from the clinic at the provider's discretion.      For prescription refill requests, have your pharmacy contact our office and allow 72 hours for refills to be completed.    Today you received the following chemotherapy and/or immunotherapy agents: Oxaliplatin , Leucovorin , Fluorouracil  (5-FU)       To help prevent nausea and vomiting after your treatment, we encourage you to take your nausea medication as directed.  BELOW ARE SYMPTOMS THAT SHOULD BE REPORTED IMMEDIATELY: *FEVER GREATER THAN 100.4 F (38 C) OR HIGHER *CHILLS OR SWEATING *NAUSEA AND VOMITING THAT IS NOT CONTROLLED WITH YOUR NAUSEA MEDICATION *UNUSUAL SHORTNESS OF BREATH *UNUSUAL BRUISING OR BLEEDING *URINARY PROBLEMS (pain or burning when urinating, or frequent urination) *BOWEL PROBLEMS (unusual diarrhea, constipation, pain near the anus) TENDERNESS IN MOUTH AND THROAT WITH OR WITHOUT PRESENCE OF ULCERS (sore throat, sores in mouth, or a toothache) UNUSUAL RASH, SWELLING OR PAIN  UNUSUAL VAGINAL DISCHARGE OR ITCHING   Items with * indicate a potential emergency and should be followed up as soon as possible or go to the Emergency Department if any problems should occur.  Please show the  CHEMOTHERAPY ALERT CARD or IMMUNOTHERAPY ALERT CARD at check-in to the Emergency Department and triage nurse.  Should you have questions after your visit or need to cancel or reschedule your appointment, please contact [facility] - A DEPT OF [facility]  Dept: [phone]  and follow the prompts.  Office hours are 8:00 a.m. to 4:30 p.m. Monday - Friday. Please note that voicemails left after 4:00 p.m. may not be returned until the following business day.  We are closed weekends and major holidays. You have access to a nurse at all times for urgent questions. Please call the main number to the clinic Dept: [phone] and follow the prompts.   For any non-urgent questions, you may also contact your provider using MyChart. We now offer e-Visits for anyone 49 and older to request care online for non-urgent symptoms. For details visit [email or web address].   Also download the MyChart app! Go to the app store, search MyChart, open the app, select [place], and log in with your MyChart username and password.

## 2024-07-27 ENCOUNTER — Other Ambulatory Visit: Payer: Self-pay | Admitting: Nurse Practitioner

## 2024-07-27 DIAGNOSIS — C182 Malignant neoplasm of ascending colon: Secondary | ICD-10-CM

## 2024-07-28 ENCOUNTER — Inpatient Hospital Stay: Attending: Oncology

## 2024-07-28 VITALS — BP 116/66 | HR 62 | Temp 99.0°F | Resp 20

## 2024-07-28 DIAGNOSIS — R251 Tremor, unspecified: Secondary | ICD-10-CM | POA: Insufficient documentation

## 2024-07-28 DIAGNOSIS — I10 Essential (primary) hypertension: Secondary | ICD-10-CM | POA: Diagnosis not present

## 2024-07-28 DIAGNOSIS — C779 Secondary and unspecified malignant neoplasm of lymph node, unspecified: Secondary | ICD-10-CM | POA: Diagnosis not present

## 2024-07-28 DIAGNOSIS — Z800 Family history of malignant neoplasm of digestive organs: Secondary | ICD-10-CM | POA: Diagnosis not present

## 2024-07-28 DIAGNOSIS — Z803 Family history of malignant neoplasm of breast: Secondary | ICD-10-CM | POA: Insufficient documentation

## 2024-07-28 DIAGNOSIS — J45909 Unspecified asthma, uncomplicated: Secondary | ICD-10-CM | POA: Diagnosis not present

## 2024-07-28 DIAGNOSIS — D696 Thrombocytopenia, unspecified: Secondary | ICD-10-CM | POA: Insufficient documentation

## 2024-07-28 DIAGNOSIS — Z5189 Encounter for other specified aftercare: Secondary | ICD-10-CM | POA: Insufficient documentation

## 2024-07-28 DIAGNOSIS — R000 Tachycardia, unspecified: Secondary | ICD-10-CM | POA: Diagnosis not present

## 2024-07-28 DIAGNOSIS — C180 Malignant neoplasm of cecum: Secondary | ICD-10-CM | POA: Diagnosis not present

## 2024-07-28 DIAGNOSIS — Z5111 Encounter for antineoplastic chemotherapy: Secondary | ICD-10-CM | POA: Diagnosis present

## 2024-07-28 DIAGNOSIS — F32A Depression, unspecified: Secondary | ICD-10-CM | POA: Insufficient documentation

## 2024-07-28 DIAGNOSIS — C182 Malignant neoplasm of ascending colon: Secondary | ICD-10-CM

## 2024-07-28 DIAGNOSIS — C787 Secondary malignant neoplasm of liver and intrahepatic bile duct: Secondary | ICD-10-CM | POA: Diagnosis not present

## 2024-07-28 DIAGNOSIS — R350 Frequency of micturition: Secondary | ICD-10-CM | POA: Insufficient documentation

## 2024-07-28 DIAGNOSIS — R918 Other nonspecific abnormal finding of lung field: Secondary | ICD-10-CM | POA: Diagnosis not present

## 2024-07-28 MED ORDER — PEGFILGRASTIM-CBQV 6 MG/0.6ML ~~LOC~~ SOSY
6.0000 mg | PREFILLED_SYRINGE | Freq: Once | SUBCUTANEOUS | Status: AC
  Administered 2024-07-28: 6 mg via SUBCUTANEOUS

## 2024-07-28 MED ORDER — SODIUM CHLORIDE 0.9% FLUSH
10.0000 mL | INTRAVENOUS | Status: DC | PRN
  Administered 2024-07-28: 10 mL

## 2024-07-28 MED ORDER — HEPARIN SOD (PORK) LOCK FLUSH 100 UNIT/ML IV SOLN
500.0000 [IU] | Freq: Once | INTRAVENOUS | Status: AC | PRN
  Administered 2024-07-28: 500 [IU]

## 2024-08-01 ENCOUNTER — Telehealth: Payer: Self-pay | Admitting: *Deleted

## 2024-08-01 NOTE — Telephone Encounter (Signed)
 Called [facility] and inquired of status of 7/30 referral. Was informed they reached out to her on 7/31 and left message. Called [facility] and gave her the practice phone # [phone] to call to schedule her appointment

## 2024-08-05 ENCOUNTER — Other Ambulatory Visit: Payer: Self-pay | Admitting: Oncology

## 2024-08-09 ENCOUNTER — Inpatient Hospital Stay

## 2024-08-09 ENCOUNTER — Inpatient Hospital Stay (HOSPITAL_BASED_OUTPATIENT_CLINIC_OR_DEPARTMENT_OTHER): Admitting: Oncology

## 2024-08-09 ENCOUNTER — Encounter: Payer: Self-pay | Admitting: *Deleted

## 2024-08-09 VITALS — BP 99/80 | HR 77 | Temp 97.9°F | Resp 18 | Ht 66.0 in | Wt 247.8 lb

## 2024-08-09 DIAGNOSIS — C182 Malignant neoplasm of ascending colon: Secondary | ICD-10-CM

## 2024-08-09 DIAGNOSIS — Z5111 Encounter for antineoplastic chemotherapy: Secondary | ICD-10-CM | POA: Diagnosis not present

## 2024-08-09 LAB — CBC WITH DIFFERENTIAL (CANCER CENTER ONLY)
Abs Immature Granulocytes: 2.05 K/uL — ABNORMAL HIGH (ref 0.00–0.07)
Basophils Absolute: 0.1 K/uL (ref 0.0–0.1)
Basophils Relative: 0 %
Eosinophils Absolute: 0.2 K/uL (ref 0.0–0.5)
Eosinophils Relative: 1 %
HCT: 34.7 % — ABNORMAL LOW (ref 36.0–46.0)
Hemoglobin: 11.4 g/dL — ABNORMAL LOW (ref 12.0–15.0)
Immature Granulocytes: 12 %
Lymphocytes Relative: 20 %
Lymphs Abs: 3.4 K/uL (ref 0.7–4.0)
MCH: 29.1 pg (ref 26.0–34.0)
MCHC: 32.9 g/dL (ref 30.0–36.0)
MCV: 88.5 fL (ref 80.0–100.0)
Monocytes Absolute: 2 K/uL — ABNORMAL HIGH (ref 0.1–1.0)
Monocytes Relative: 12 %
Neutro Abs: 9.1 K/uL — ABNORMAL HIGH (ref 1.7–7.7)
Neutrophils Relative %: 55 %
Platelet Count: 169 K/uL (ref 150–400)
RBC: 3.92 MIL/uL (ref 3.87–5.11)
RDW: 17.9 % — ABNORMAL HIGH (ref 11.5–15.5)
WBC Count: 16.7 K/uL — ABNORMAL HIGH (ref 4.0–10.5)
nRBC: 1 % — ABNORMAL HIGH (ref 0.0–0.2)

## 2024-08-09 LAB — CMP (CANCER CENTER ONLY)
ALT: 30 U/L (ref 0–44)
AST: 39 U/L (ref 15–41)
Albumin: 4 g/dL (ref 3.5–5.0)
Alkaline Phosphatase: 215 U/L — ABNORMAL HIGH (ref 38–126)
Anion gap: 13 (ref 5–15)
BUN: 7 mg/dL (ref 6–20)
CO2: 25 mmol/L (ref 22–32)
Calcium: 9.9 mg/dL (ref 8.9–10.3)
Chloride: 101 mmol/L (ref 98–111)
Creatinine: 0.87 mg/dL (ref 0.44–1.00)
GFR, Estimated: >60 mL/min (ref >60)
Glucose, Bld: 175 mg/dL — ABNORMAL HIGH (ref 70–99)
Potassium: 4.1 mmol/L (ref 3.5–5.1)
Sodium: 138 mmol/L (ref 135–145)
Total Bilirubin: 0.2 mg/dL (ref 0.0–1.2)
Total Protein: 6.7 g/dL (ref 6.5–8.1)

## 2024-08-09 LAB — CEA (ACCESS): CEA (CHCC): 3.67 ng/mL (ref 0.00–5.00)

## 2024-08-09 MED ORDER — OXALIPLATIN CHEMO INJECTION 100 MG/20ML
65.0000 mg/m2 | Freq: Once | INTRAVENOUS | Status: AC
  Administered 2024-08-09: 150 mg via INTRAVENOUS
  Filled 2024-08-09: qty 20

## 2024-08-09 MED ORDER — PALONOSETRON HCL INJECTION 0.25 MG/5ML
0.2500 mg | Freq: Once | INTRAVENOUS | Status: AC
  Administered 2024-08-09: 0.25 mg via INTRAVENOUS
  Filled 2024-08-09: qty 5

## 2024-08-09 MED ORDER — DEXAMETHASONE SODIUM PHOSPHATE 10 MG/ML IJ SOLN
10.0000 mg | Freq: Once | INTRAMUSCULAR | Status: AC
  Administered 2024-08-09: 10 mg via INTRAVENOUS
  Filled 2024-08-09: qty 1

## 2024-08-09 MED ORDER — SODIUM CHLORIDE 0.9 % IV SOLN
2400.0000 mg/m2 | INTRAVENOUS | Status: DC
  Administered 2024-08-09: 5000 mg via INTRAVENOUS
  Filled 2024-08-09: qty 100

## 2024-08-09 MED ORDER — DEXTROSE 5 % IV SOLN: INTRAVENOUS | Status: DC

## 2024-08-09 MED ORDER — LEUCOVORIN CALCIUM INJECTION 350 MG
400.0000 mg/m2 | Freq: Once | INTRAVENOUS | Status: AC
  Administered 2024-08-09: 912 mg via INTRAVENOUS
  Filled 2024-08-09: qty 45.6

## 2024-08-09 MED ORDER — FLUOROURACIL CHEMO INJECTION 2.5 GM/50ML
400.0000 mg/m2 | Freq: Once | INTRAVENOUS | Status: AC
  Administered 2024-08-09: 1000 mg via INTRAVENOUS
  Filled 2024-08-09: qty 20

## 2024-08-09 NOTE — Progress Notes (Signed)
 Patient seen by Dr. Arley Hof today  Vitals are within treatment parameters:Yes   Labs are within treatment parameters: Yes OK to treat w/pending ANC  Treatment plan has been signed: Yes   Per physician team, Patient is ready for treatment and there are NO modifications to the treatment plan.

## 2024-08-09 NOTE — Patient Instructions (Signed)

## 2024-08-09 NOTE — Progress Notes (Signed)
 [facility] OFFICE PROGRESS NOTE   Diagnosis: Colon cancer  INTERVAL HISTORY:   Ms. Motter completed another cycle of FOLFOX on 07/26/2024.  She reports prolonged cold sensitivity following chemotherapy.  No neuropathy symptoms at present.  She had nausea, but no emesis.  She had diarrhea during the week following chemotherapy.  She did not use Imodium .  She continues to have swelling and hyperpigmentation at the neck. She stays at home most of the time.  Objective:  Vital signs in last 24 hours:  Blood pressure 99/80, pulse 77, temperature 97.9 F (36.6 C), temperature source Temporal, resp. rate 18, height 5' 6 (1.676 m), weight 247 lb 12.8 oz (112.4 kg), last menstrual period 09/17/2017, SpO2 96%.    HEENT: No thrush or ulcers, mild soft tissue prominence in the bilateral low anterior neck/supraclavicular fossa Resp: Lungs clear bilaterally Cardio: Regular rate and rhythm GI: No hepatosplenomegaly, nontender Vascular: No leg edema  Skin: Mild hyperpigmentation of the hands  Portacath/PICC-without erythema  Lab Results:  Lab Results  Component Value Date   WBC 16.7 (H) 08/09/2024   HGB 11.4 (L) 08/09/2024   HCT 34.7 (L) 08/09/2024   MCV 88.5 08/09/2024   PLT 169 08/09/2024   NEUTROABS PENDING 08/09/2024    CMP  Lab Results  Component Value Date   NA 139 07/26/2024   K 3.8 07/26/2024   CL 100 07/26/2024   CO2 27 07/26/2024   GLUCOSE 151 (H) 07/26/2024   BUN 7 07/26/2024   CREATININE 1.05 (H) 07/26/2024   CALCIUM  9.9 07/26/2024   PROT 7.0 07/26/2024   ALBUMIN  4.3 07/26/2024   AST 38 07/26/2024   ALT 28 07/26/2024   ALKPHOS 188 (H) 07/26/2024   BILITOT 0.3 07/26/2024   GFRNONAA >60 07/26/2024   GFRAA >60 07/19/2018    Lab Results  Component Value Date   CEA 5.98 (H) 07/26/2024     Medications: I have reviewed the patient's current medications.   Assessment/Plan: Colon cancer, cecum, stage IIIb (pT4a, PN1a), Ileocecectomy  04/12/2024-moderately differentiated adenocarcinoma of the cecum, no macroscopic tumor perforation, carcinoma invades into the serosal surface, no lymphovascular perineural invasion, negative resection margins, 1/3 nodes, 1 tumor deposit, normal mismatch repair protein expression, microsatellite stable CT abdomen/pelvis 04/11/2024: Dilated appendix with extensive periappendiceal inflammatory changes and a 5 cm fluid collection, no enlarged abdominal pelvic lymph nodes, unchanged 9 mm right adrenal nodule from 2018 CT chest 04/21/2024: 1.4 cm anterior mediastinal nodule or node, bilateral tiny nonspecific pulmonary nodules PET scan 06/08/2024-developing metastatic lesions right hemiabdominal mesenteric lymph nodes as well as at least 2 liver metastases.  Anterior superior mediastinal soft tissue nodule is similar to the previous chest CT, no abnormal uptake. Cycle 1 FOLFOX 06/12/2024 MRI liver 06/19/2024-numerous small rim-enhancing hypovascular masses seen throughout the liver.  Right lower quadrant mesenteric lymphadenopathy measuring 1.8 cm. Cycle 2 FOLFOX 06/25/2024 Cycle 3 FOLFOX 07/12/2024 Cycle 4 FOLFOX 07/26/2024, oxaliplatin  dose reduced due to persistent cold sensitivity and mild thrombocytopenia Cycle 5 FOLFOX 08/09/2024 Depression Urinary frequency Hypertension Tremors Tachycardia Asthma Family history of breast and colon cancer Colonoscopy-needs colonoscopy approximately 6 months from diagnosis    Disposition: Ms. Kjos has completed 4 cycles of FOLFOX.  She reports improvement in nausea following the last cycle of chemotherapy.  The CEA has normalized.  She will complete cycle 5 FOLFOX today.  She will like to undergo a restaging evaluation after this cycle.  The liver metastases were not clearly seen on the initial CT.  She will be referred  for a restaging MRI.  We will obtain a chest CT with subsequent restaging evaluations.  She will return for an office visit in 2 weeks.  Arley Hof, MD  08/09/2024  11:14 AM

## 2024-08-09 NOTE — Patient Instructions (Signed)
 [facility] - A DEPT OF [facility]  Discharge Instructions: Thank you for choosing [facility] to provide your oncology and hematology care.   If you have a lab appointment with the Cancer Center, please go directly to the Cancer Center and check in at the registration area.   Wear comfortable clothing and clothing appropriate for easy access to any Portacath or PICC line.   We strive to give you quality time with your provider. You may need to reschedule your appointment if you arrive late (15 or more minutes).  Arriving late affects you and other patients whose appointments are after yours.  Also, if you miss three or more appointments without notifying the office, you may be dismissed from the clinic at the provider's discretion.      For prescription refill requests, have your pharmacy contact our office and allow 72 hours for refills to be completed.    Today you received the following chemotherapy and/or immunotherapy agents: Oxaliplatin , Leucovorin , Fluorouracil  (5-FU)       To help prevent nausea and vomiting after your treatment, we encourage you to take your nausea medication as directed.  BELOW ARE SYMPTOMS THAT SHOULD BE REPORTED IMMEDIATELY: *FEVER GREATER THAN 100.4 F (38 C) OR HIGHER *CHILLS OR SWEATING *NAUSEA AND VOMITING THAT IS NOT CONTROLLED WITH YOUR NAUSEA MEDICATION *UNUSUAL SHORTNESS OF BREATH *UNUSUAL BRUISING OR BLEEDING *URINARY PROBLEMS (pain or burning when urinating, or frequent urination) *BOWEL PROBLEMS (unusual diarrhea, constipation, pain near the anus) TENDERNESS IN MOUTH AND THROAT WITH OR WITHOUT PRESENCE OF ULCERS (sore throat, sores in mouth, or a toothache) UNUSUAL RASH, SWELLING OR PAIN  UNUSUAL VAGINAL DISCHARGE OR ITCHING   Items with * indicate a potential emergency and should be followed up as soon as possible or go to the Emergency Department if any problems should occur.  Please show the  CHEMOTHERAPY ALERT CARD or IMMUNOTHERAPY ALERT CARD at check-in to the Emergency Department and triage nurse.  Should you have questions after your visit or need to cancel or reschedule your appointment, please contact [facility] - A DEPT OF [facility]  Dept: [phone]  and follow the prompts.  Office hours are 8:00 a.m. to 4:30 p.m. Monday - Friday. Please note that voicemails left after 4:00 p.m. may not be returned until the following business day.  We are closed weekends and major holidays. You have access to a nurse at all times for urgent questions. Please call the main number to the clinic Dept: [phone] and follow the prompts.   For any non-urgent questions, you may also contact your provider using MyChart. We now offer e-Visits for anyone 49 and older to request care online for non-urgent symptoms. For details visit [email or web address].   Also download the MyChart app! Go to the app store, search MyChart, open the app, select [place], and log in with your MyChart username and password.

## 2024-08-09 NOTE — Progress Notes (Signed)
 Ms. Scatena is scheduled for court appearance on 9/02 and needs MD to dictate letter that she is not able to attend due to her health condition and treatment.

## 2024-08-11 ENCOUNTER — Inpatient Hospital Stay

## 2024-08-11 VITALS — BP 149/88 | HR 66 | Temp 96.8°F | Resp 17

## 2024-08-11 DIAGNOSIS — Z5111 Encounter for antineoplastic chemotherapy: Secondary | ICD-10-CM | POA: Diagnosis not present

## 2024-08-11 DIAGNOSIS — C182 Malignant neoplasm of ascending colon: Secondary | ICD-10-CM

## 2024-08-11 MED ORDER — PEGFILGRASTIM-CBQV 6 MG/0.6ML ~~LOC~~ SOSY
6.0000 mg | PREFILLED_SYRINGE | Freq: Once | SUBCUTANEOUS | Status: AC
  Administered 2024-08-11: 6 mg via SUBCUTANEOUS
  Filled 2024-08-11: qty 0.6

## 2024-08-11 MED ORDER — HEPARIN SOD (PORK) LOCK FLUSH 100 UNIT/ML IV SOLN
500.0000 [IU] | Freq: Once | INTRAVENOUS | Status: DC | PRN
Start: 2024-08-11 — End: 2024-08-11

## 2024-08-11 MED ORDER — SODIUM CHLORIDE 0.9% FLUSH
10.0000 mL | INTRAVENOUS | Status: DC | PRN
  Administered 2024-08-11: 10 mL

## 2024-08-11 NOTE — Patient Instructions (Signed)

## 2024-08-14 ENCOUNTER — Telehealth: Payer: Self-pay | Admitting: *Deleted

## 2024-08-14 NOTE — Telephone Encounter (Signed)
 Notified by central scheduling they have been unsuccessful reaching patient to schedule her MRI scan. Called her and left VM with phone # for central scheduling to call to set up MRI appointment.

## 2024-08-15 ENCOUNTER — Telehealth: Payer: Self-pay | Admitting: *Deleted

## 2024-08-15 ENCOUNTER — Other Ambulatory Visit: Payer: Self-pay | Admitting: Oncology

## 2024-08-15 NOTE — Telephone Encounter (Signed)
 Notified daughter that letter for her court case date is complete and ready to be picked up. Not able to reach [facility]. Also included in envelope the central scheduling number to set up her MRI. They have not been able to reach her.

## 2024-08-16 ENCOUNTER — Inpatient Hospital Stay

## 2024-08-16 ENCOUNTER — Telehealth: Payer: Self-pay | Admitting: *Deleted

## 2024-08-16 NOTE — Telephone Encounter (Signed)
 Daughter, Nathanial called to ask about medical marijuana to help [facility] feel better. Informed her that Dr. Cloretta does not subscribe to this therapy and will not approve it. Also made her aware that her court letter is ready to be picked up.

## 2024-08-16 NOTE — Progress Notes (Signed)
 [facility] CSW Progress Note  Patient scheduled for video counseling appointment on this date at 1:30. Patient did not join call with CSW. Patient marked a now show due to missing the time period.      Follow Up Plan:  No follow up scheduled.    Lizbeth Sprague, LCSW Clinical Social Worker [facility]

## 2024-08-19 ENCOUNTER — Other Ambulatory Visit: Payer: Self-pay | Admitting: Oncology

## 2024-08-22 ENCOUNTER — Telehealth: Payer: Self-pay

## 2024-08-22 NOTE — Telephone Encounter (Signed)
 [facility] CSW Progress Note  Clinical Child psychotherapist contacted patient by phone to follow-up on [employer] Documentation on behalf of Patient Film/video editor. Patient confirms she receives [employer] benefits, meeting eligibility requirements. Patient stated she would bring documents in on 8/28. Patient given approval by Engineer, manufacturing systems to receive Gas card at visit. CSW checked in on patient's needs, patient denies any concerns. Patient did not initiate clinical services with CSW, and understands these services are available to her.    Follow Up Plan:  Patient will contact CSW with any support or resource needs    Lizbeth Sprague, LCSW Clinical Social Worker [facility]

## 2024-08-23 ENCOUNTER — Inpatient Hospital Stay

## 2024-08-23 ENCOUNTER — Telehealth: Payer: Self-pay | Admitting: Oncology

## 2024-08-23 ENCOUNTER — Other Ambulatory Visit: Payer: Self-pay | Admitting: *Deleted

## 2024-08-23 ENCOUNTER — Inpatient Hospital Stay: Admitting: Oncology

## 2024-08-23 DIAGNOSIS — C182 Malignant neoplasm of ascending colon: Secondary | ICD-10-CM

## 2024-08-23 NOTE — Telephone Encounter (Signed)
 Called PT's daughter to see if PT is okay, missed appointment, will re-sch for next week after update.

## 2024-08-23 NOTE — Progress Notes (Signed)
 [facility] CSW Progress Note  Clinical Child psychotherapist received [employer] benefits documentation on behalf of Engineer, manufacturing systems.  Documents sent to Engineer, manufacturing systems via email.    Follow Up Plan:  No follow up scheduled.    Lizbeth Sprague, LCSW Clinical Social Worker [facility]

## 2024-08-23 NOTE — Telephone Encounter (Signed)
 Rescheduled missed appt. PT had times mixed up.

## 2024-08-25 ENCOUNTER — Inpatient Hospital Stay

## 2024-08-30 ENCOUNTER — Encounter: Payer: Self-pay | Admitting: Nurse Practitioner

## 2024-08-30 ENCOUNTER — Inpatient Hospital Stay: Attending: Oncology

## 2024-08-30 ENCOUNTER — Inpatient Hospital Stay

## 2024-08-30 ENCOUNTER — Inpatient Hospital Stay (HOSPITAL_BASED_OUTPATIENT_CLINIC_OR_DEPARTMENT_OTHER): Admitting: Nurse Practitioner

## 2024-08-30 VITALS — BP 147/91 | HR 76 | Temp 97.8°F | Resp 18 | Ht 66.0 in | Wt 248.7 lb

## 2024-08-30 DIAGNOSIS — R590 Localized enlarged lymph nodes: Secondary | ICD-10-CM | POA: Diagnosis not present

## 2024-08-30 DIAGNOSIS — R918 Other nonspecific abnormal finding of lung field: Secondary | ICD-10-CM | POA: Insufficient documentation

## 2024-08-30 DIAGNOSIS — Z5189 Encounter for other specified aftercare: Secondary | ICD-10-CM | POA: Insufficient documentation

## 2024-08-30 DIAGNOSIS — R197 Diarrhea, unspecified: Secondary | ICD-10-CM | POA: Diagnosis not present

## 2024-08-30 DIAGNOSIS — R251 Tremor, unspecified: Secondary | ICD-10-CM | POA: Insufficient documentation

## 2024-08-30 DIAGNOSIS — F32A Depression, unspecified: Secondary | ICD-10-CM | POA: Diagnosis not present

## 2024-08-30 DIAGNOSIS — R110 Nausea: Secondary | ICD-10-CM | POA: Diagnosis not present

## 2024-08-30 DIAGNOSIS — I10 Essential (primary) hypertension: Secondary | ICD-10-CM | POA: Diagnosis not present

## 2024-08-30 DIAGNOSIS — C180 Malignant neoplasm of cecum: Secondary | ICD-10-CM | POA: Insufficient documentation

## 2024-08-30 DIAGNOSIS — R350 Frequency of micturition: Secondary | ICD-10-CM | POA: Diagnosis not present

## 2024-08-30 DIAGNOSIS — C182 Malignant neoplasm of ascending colon: Secondary | ICD-10-CM

## 2024-08-30 DIAGNOSIS — C787 Secondary malignant neoplasm of liver and intrahepatic bile duct: Secondary | ICD-10-CM | POA: Diagnosis not present

## 2024-08-30 DIAGNOSIS — R160 Hepatomegaly, not elsewhere classified: Secondary | ICD-10-CM | POA: Insufficient documentation

## 2024-08-30 DIAGNOSIS — J45909 Unspecified asthma, uncomplicated: Secondary | ICD-10-CM | POA: Insufficient documentation

## 2024-08-30 DIAGNOSIS — Z800 Family history of malignant neoplasm of digestive organs: Secondary | ICD-10-CM | POA: Diagnosis not present

## 2024-08-30 DIAGNOSIS — D696 Thrombocytopenia, unspecified: Secondary | ICD-10-CM | POA: Diagnosis not present

## 2024-08-30 DIAGNOSIS — Z803 Family history of malignant neoplasm of breast: Secondary | ICD-10-CM | POA: Diagnosis not present

## 2024-08-30 DIAGNOSIS — R000 Tachycardia, unspecified: Secondary | ICD-10-CM | POA: Insufficient documentation

## 2024-08-30 DIAGNOSIS — Z5111 Encounter for antineoplastic chemotherapy: Secondary | ICD-10-CM | POA: Insufficient documentation

## 2024-08-30 LAB — CBC WITH DIFFERENTIAL (CANCER CENTER ONLY)
Abs Immature Granulocytes: 0.53 K/uL — ABNORMAL HIGH (ref 0.00–0.07)
Basophils Absolute: 0.1 K/uL (ref 0.0–0.1)
Basophils Relative: 1 %
Eosinophils Absolute: 0.1 K/uL (ref 0.0–0.5)
Eosinophils Relative: 1 %
HCT: 34 % — ABNORMAL LOW (ref 36.0–46.0)
Hemoglobin: 11.4 g/dL — ABNORMAL LOW (ref 12.0–15.0)
Immature Granulocytes: 4 %
Lymphocytes Relative: 24 %
Lymphs Abs: 3 K/uL (ref 0.7–4.0)
MCH: 29.9 pg (ref 26.0–34.0)
MCHC: 33.5 g/dL (ref 30.0–36.0)
MCV: 89.2 fL (ref 80.0–100.0)
Monocytes Absolute: 1.1 K/uL — ABNORMAL HIGH (ref 0.1–1.0)
Monocytes Relative: 8 %
Neutro Abs: 7.7 K/uL (ref 1.7–7.7)
Neutrophils Relative %: 62 %
Platelet Count: 196 K/uL (ref 150–400)
RBC: 3.81 MIL/uL — ABNORMAL LOW (ref 3.87–5.11)
RDW: 19.9 % — ABNORMAL HIGH (ref 11.5–15.5)
WBC Count: 12.5 K/uL — ABNORMAL HIGH (ref 4.0–10.5)
nRBC: 0.2 % (ref 0.0–0.2)

## 2024-08-30 LAB — CMP (CANCER CENTER ONLY)
ALT: 32 U/L (ref 0–44)
AST: 33 U/L (ref 15–41)
Albumin: 4.3 g/dL (ref 3.5–5.0)
Alkaline Phosphatase: 169 U/L — ABNORMAL HIGH (ref 38–126)
Anion gap: 13 (ref 5–15)
BUN: 13 mg/dL (ref 6–20)
CO2: 24 mmol/L (ref 22–32)
Calcium: 9.9 mg/dL (ref 8.9–10.3)
Chloride: 102 mmol/L (ref 98–111)
Creatinine: 1.02 mg/dL — ABNORMAL HIGH (ref 0.44–1.00)
GFR, Estimated: >60 mL/min (ref >60)
Glucose, Bld: 110 mg/dL — ABNORMAL HIGH (ref 70–99)
Potassium: 3.6 mmol/L (ref 3.5–5.1)
Sodium: 139 mmol/L (ref 135–145)
Total Bilirubin: 0.3 mg/dL (ref 0.0–1.2)
Total Protein: 6.7 g/dL (ref 6.5–8.1)

## 2024-08-30 LAB — CEA (ACCESS): CEA (CHCC): 2.57 ng/mL (ref 0.00–5.00)

## 2024-08-30 MED ORDER — DEXAMETHASONE SODIUM PHOSPHATE 10 MG/ML IJ SOLN
10.0000 mg | Freq: Once | INTRAMUSCULAR | Status: AC
  Administered 2024-08-30: 10 mg via INTRAVENOUS
  Filled 2024-08-30: qty 1

## 2024-08-30 MED ORDER — LEUCOVORIN CALCIUM INJECTION 350 MG
400.0000 mg/m2 | Freq: Once | INTRAVENOUS | Status: DC
  Filled 2024-08-30: qty 45.6

## 2024-08-30 MED ORDER — SODIUM CHLORIDE 0.9 % IV SOLN
2400.0000 mg/m2 | INTRAVENOUS | Status: DC
  Administered 2024-08-30: 5000 mg via INTRAVENOUS
  Filled 2024-08-30: qty 100

## 2024-08-30 MED ORDER — FLUOROURACIL CHEMO INJECTION 2.5 GM/50ML
400.0000 mg/m2 | Freq: Once | INTRAVENOUS | Status: AC
  Administered 2024-08-30: 1000 mg via INTRAVENOUS
  Filled 2024-08-30: qty 20

## 2024-08-30 MED ORDER — OXALIPLATIN CHEMO INJECTION 100 MG/20ML
65.0000 mg/m2 | Freq: Once | INTRAVENOUS | Status: AC
  Administered 2024-08-30: 150 mg via INTRAVENOUS
  Filled 2024-08-30: qty 20

## 2024-08-30 MED ORDER — LEUCOVORIN CALCIUM INJECTION 350 MG
400.0000 mg/m2 | Freq: Once | INTRAVENOUS | Status: AC
  Administered 2024-08-30: 912 mg via INTRAVENOUS
  Filled 2024-08-30: qty 45.6

## 2024-08-30 MED ORDER — ONDANSETRON HCL 8 MG PO TABS: 8.0000 mg | ORAL_TABLET | Freq: Three times a day (TID) | ORAL | 3 refills | Status: AC | PRN

## 2024-08-30 MED ORDER — PROCHLORPERAZINE MALEATE 10 MG PO TABS: 10.0000 mg | ORAL_TABLET | Freq: Four times a day (QID) | ORAL | 3 refills | Status: DC | PRN

## 2024-08-30 MED ORDER — PALONOSETRON HCL INJECTION 0.25 MG/5ML
0.2500 mg | Freq: Once | INTRAVENOUS | Status: AC
  Administered 2024-08-30: 0.25 mg via INTRAVENOUS
  Filled 2024-08-30: qty 5

## 2024-08-30 MED ORDER — DEXTROSE 5 % IV SOLN: INTRAVENOUS | Status: DC

## 2024-08-30 NOTE — Patient Instructions (Signed)
 [facility] - A DEPT OF [facility]  Discharge Instructions: Thank you for choosing [facility] to provide your oncology and hematology care.   If you have a lab appointment with the Cancer Center, please go directly to the Cancer Center and check in at the registration area.   Wear comfortable clothing and clothing appropriate for easy access to any Portacath or PICC line.   We strive to give you quality time with your provider. You may need to reschedule your appointment if you arrive late (15 or more minutes).  Arriving late affects you and other patients whose appointments are after yours.  Also, if you miss three or more appointments without notifying the office, you may be dismissed from the clinic at the provider's discretion.      For prescription refill requests, have your pharmacy contact our office and allow 72 hours for refills to be completed.    Today you received the following chemotherapy and/or immunotherapy agents: oxaliplatin , leucovorin , fluorouracil        To help prevent nausea and vomiting after your treatment, we encourage you to take your nausea medication as directed.  BELOW ARE SYMPTOMS THAT SHOULD BE REPORTED IMMEDIATELY: *FEVER GREATER THAN 100.4 F (38 C) OR HIGHER *CHILLS OR SWEATING *NAUSEA AND VOMITING THAT IS NOT CONTROLLED WITH YOUR NAUSEA MEDICATION *UNUSUAL SHORTNESS OF BREATH *UNUSUAL BRUISING OR BLEEDING *URINARY PROBLEMS (pain or burning when urinating, or frequent urination) *BOWEL PROBLEMS (unusual diarrhea, constipation, pain near the anus) TENDERNESS IN MOUTH AND THROAT WITH OR WITHOUT PRESENCE OF ULCERS (sore throat, sores in mouth, or a toothache) UNUSUAL RASH, SWELLING OR PAIN  UNUSUAL VAGINAL DISCHARGE OR ITCHING   Items with * indicate a potential emergency and should be followed up as soon as possible or go to the Emergency Department if any problems should occur.  Please show the CHEMOTHERAPY  ALERT CARD or IMMUNOTHERAPY ALERT CARD at check-in to the Emergency Department and triage nurse.  Should you have questions after your visit or need to cancel or reschedule your appointment, please contact [facility] - A DEPT OF [facility]  Dept: [phone]  and follow the prompts.  Office hours are 8:00 a.m. to 4:30 p.m. Monday - Friday. Please note that voicemails left after 4:00 p.m. may not be returned until the following business day.  We are closed weekends and major holidays. You have access to a nurse at all times for urgent questions. Please call the main number to the clinic Dept: [phone] and follow the prompts.   For any non-urgent questions, you may also contact your provider using MyChart. We now offer e-Visits for anyone 67 and older to request care online for non-urgent symptoms. For details visit [email or web address].   Also download the MyChart app! Go to the app store, search MyChart, open the app, select [facility], and log in with your MyChart username and password.

## 2024-08-30 NOTE — Progress Notes (Signed)
 Patient seen by Olam Ned NP today  Vitals are within treatment parameters:Yes B/P 147/91 its ok to proceed   Labs are within treatment parameters: Yes   Treatment plan has been signed: Yes   Per physician team, Patient is ready for treatment and there are NO modifications to the treatment plan.

## 2024-08-30 NOTE — Progress Notes (Signed)
  [facility] OFFICE PROGRESS NOTE   Diagnosis: Colon cancer  INTERVAL HISTORY:   [facility] returns for follow-up.  She completed cycle 5 FOLFOX 08/09/2024.  She had mild intermittent nausea for about a week after treatment.  Home medications partially effective.  No mouth sores.  She tends to have loose stools for 1 week after treatment, max 3/day.  She notes eye irritation, some watering after treatment.  She has mild persistent cold sensitivity.  No numbness or tingling in the absence of cold exposure.  No difficulty with activity.  Objective:  Vital signs in last 24 hours:  Blood pressure (!) 147/91, pulse 76, temperature 97.8 F (36.6 C), temperature source Temporal, resp. rate 18, height 5' 6 (1.676 m), weight 248 lb 11.2 oz (112.8 kg), last menstrual period 09/17/2017, SpO2 99%.    HEENT: No eye erythema.  No thrush or ulcers. Resp: Lungs clear bilaterally. Cardio: Regular rate and rhythm. GI: No hepatosplenomegaly.  Healed midline surgical incision. Vascular: No leg edema. Neuro: Vibratory sense intact over the fingertips bilaterally per tuning fork exam. Skin: Palms with hyperpigmentation. Port-A-Cath without erythema.  Lab Results:  Lab Results  Component Value Date   WBC 12.5 (H) 08/30/2024   HGB 11.4 (L) 08/30/2024   HCT 34.0 (L) 08/30/2024   MCV 89.2 08/30/2024   PLT 196 08/30/2024   NEUTROABS 7.7 08/30/2024    Imaging:  No results found.  Medications: I have reviewed the patient's current medications.  Assessment/Plan: Colon cancer, cecum, stage IIIb (pT4a, PN1a), Ileocecectomy 04/12/2024-moderately differentiated adenocarcinoma of the cecum, no macroscopic tumor perforation, carcinoma invades into the serosal surface, no lymphovascular perineural invasion, negative resection margins, 1/3 nodes, 1 tumor deposit, normal mismatch repair protein expression, microsatellite stable CT abdomen/pelvis 04/11/2024: Dilated appendix with extensive  periappendiceal inflammatory changes and a 5 cm fluid collection, no enlarged abdominal pelvic lymph nodes, unchanged 9 mm right adrenal nodule from 2018 CT chest 04/21/2024: 1.4 cm anterior mediastinal nodule or node, bilateral tiny nonspecific pulmonary nodules PET scan 06/08/2024-developing metastatic lesions right hemiabdominal mesenteric lymph nodes as well as at least 2 liver metastases.  Anterior superior mediastinal soft tissue nodule is similar to the previous chest CT, no abnormal uptake. Cycle 1 FOLFOX 06/12/2024 MRI liver 06/19/2024-numerous small rim-enhancing hypovascular masses seen throughout the liver.  Right lower quadrant mesenteric lymphadenopathy measuring 1.8 cm. Cycle 2 FOLFOX 06/25/2024 Cycle 3 FOLFOX 07/12/2024 Cycle 4 FOLFOX 07/26/2024, oxaliplatin  dose reduced due to persistent cold sensitivity and mild thrombocytopenia Cycle 5 FOLFOX 08/09/2024 Cycle 6 FOLFOX 08/30/2024 Depression Urinary frequency Hypertension Tremors Tachycardia Asthma Family history of breast and colon cancer Colonoscopy-needs colonoscopy approximately 6 months from diagnosis  Disposition: [facility] appears stable.  She has completed 5 cycles of FOLFOX.  She is tolerating treatment fairly well, having mild nausea and loose stools.  She will continue home antiemetics as needed.  She will try Imodium  for the loose stools.  She understands to contact the office with poorly controlled nausea or diarrhea.  Most recent CEA was improved.  MRI was planned to be completed prior to today's visit but is scheduled on 09/01/2024.  She would like to go ahead with today's treatment.  MRI will be rescheduled to 09/08/2024.  Plan to proceed with cycle 6 FOLFOX today as scheduled.  CBC and chemistry panel reviewed.  Labs adequate for treatment.  She reports eye irritation, watering after treatment.  She understands this could be due to the chemotherapy and may worsen.  She would like to proceed  with treatment today as  scheduled.  She will follow-up with her eye doctor.  She will return for follow-up in 2 weeks.  We are available to see her sooner if needed.  Plan reviewed with Dr. Cloretta.    Olam Ned ANP/GNP-BC   08/30/2024  8:46 AM

## 2024-08-30 NOTE — Patient Instructions (Signed)

## 2024-08-31 ENCOUNTER — Other Ambulatory Visit: Payer: Self-pay

## 2024-09-01 ENCOUNTER — Ambulatory Visit (HOSPITAL_COMMUNITY)

## 2024-09-01 ENCOUNTER — Inpatient Hospital Stay

## 2024-09-01 VITALS — BP 152/86 | HR 66 | Temp 97.2°F | Resp 17

## 2024-09-01 DIAGNOSIS — C182 Malignant neoplasm of ascending colon: Secondary | ICD-10-CM

## 2024-09-01 DIAGNOSIS — Z5111 Encounter for antineoplastic chemotherapy: Secondary | ICD-10-CM | POA: Diagnosis not present

## 2024-09-01 MED ORDER — PEGFILGRASTIM-CBQV 6 MG/0.6ML ~~LOC~~ SOSY
6.0000 mg | PREFILLED_SYRINGE | Freq: Once | SUBCUTANEOUS | Status: AC
  Administered 2024-09-01: 6 mg via SUBCUTANEOUS
  Filled 2024-09-01: qty 0.6

## 2024-09-06 ENCOUNTER — Other Ambulatory Visit

## 2024-09-06 ENCOUNTER — Telehealth: Payer: Self-pay | Admitting: *Deleted

## 2024-09-06 ENCOUNTER — Ambulatory Visit

## 2024-09-06 ENCOUNTER — Ambulatory Visit: Admitting: Nurse Practitioner

## 2024-09-06 NOTE — Telephone Encounter (Signed)
 Notified patient that Dr. Cloretta said to just monitor for now. Nothing to do if it has resolved. Call for consistent rectal bleeding or clots are seen.

## 2024-09-06 NOTE — Telephone Encounter (Signed)
 [facility] called to report several episodes of diarrhea yesterday and had bright red blood in toilet (no clots). Was having some abdominal cramping as well. Today, no diarrhea, blood or cramping. Just some mild nausea. She denies any known external hemorrhoids. Informed her she could have some internal hemorrhoids that were irritated by the diarrhea. Will make MD aware.

## 2024-09-08 ENCOUNTER — Ambulatory Visit (HOSPITAL_COMMUNITY): Admission: RE | Admit: 2024-09-08 | Source: Ambulatory Visit

## 2024-09-08 ENCOUNTER — Encounter (HOSPITAL_COMMUNITY): Payer: Self-pay

## 2024-09-08 ENCOUNTER — Encounter

## 2024-09-09 ENCOUNTER — Other Ambulatory Visit: Payer: Self-pay | Admitting: Oncology

## 2024-09-13 ENCOUNTER — Inpatient Hospital Stay

## 2024-09-13 ENCOUNTER — Inpatient Hospital Stay (HOSPITAL_BASED_OUTPATIENT_CLINIC_OR_DEPARTMENT_OTHER): Admitting: Nurse Practitioner

## 2024-09-13 ENCOUNTER — Ambulatory Visit (HOSPITAL_COMMUNITY): Admission: RE | Admit: 2024-09-13 | Source: Ambulatory Visit

## 2024-09-13 ENCOUNTER — Encounter: Payer: Self-pay | Admitting: Oncology

## 2024-09-13 ENCOUNTER — Encounter: Payer: Self-pay | Admitting: Nurse Practitioner

## 2024-09-13 ENCOUNTER — Other Ambulatory Visit (HOSPITAL_BASED_OUTPATIENT_CLINIC_OR_DEPARTMENT_OTHER): Payer: Self-pay

## 2024-09-13 VITALS — BP 145/90 | HR 82 | Temp 97.6°F | Resp 18 | Ht 66.0 in | Wt 250.5 lb

## 2024-09-13 DIAGNOSIS — C182 Malignant neoplasm of ascending colon: Secondary | ICD-10-CM

## 2024-09-13 DIAGNOSIS — Z5111 Encounter for antineoplastic chemotherapy: Secondary | ICD-10-CM | POA: Diagnosis not present

## 2024-09-13 LAB — CMP (CANCER CENTER ONLY)
ALT: 23 U/L (ref 0–44)
AST: 32 U/L (ref 15–41)
Albumin: 4.3 g/dL (ref 3.5–5.0)
Alkaline Phosphatase: 192 U/L — ABNORMAL HIGH (ref 38–126)
Anion gap: 14 (ref 5–15)
BUN: 8 mg/dL (ref 6–20)
CO2: 24 mmol/L (ref 22–32)
Calcium: 9.8 mg/dL (ref 8.9–10.3)
Chloride: 101 mmol/L (ref 98–111)
Creatinine: 0.91 mg/dL (ref 0.44–1.00)
GFR, Estimated: >60 mL/min (ref >60)
Glucose, Bld: 144 mg/dL — ABNORMAL HIGH (ref 70–99)
Potassium: 3.5 mmol/L (ref 3.5–5.1)
Sodium: 139 mmol/L (ref 135–145)
Total Bilirubin: 0.3 mg/dL (ref 0.0–1.2)
Total Protein: 6.9 g/dL (ref 6.5–8.1)

## 2024-09-13 LAB — CBC WITH DIFFERENTIAL (CANCER CENTER ONLY)
Abs Immature Granulocytes: 0.57 K/uL — ABNORMAL HIGH (ref 0.00–0.07)
Basophils Absolute: 0.1 K/uL (ref 0.0–0.1)
Basophils Relative: 1 %
Eosinophils Absolute: 0.3 K/uL (ref 0.0–0.5)
Eosinophils Relative: 3 %
HCT: 35.1 % — ABNORMAL LOW (ref 36.0–46.0)
Hemoglobin: 11.7 g/dL — ABNORMAL LOW (ref 12.0–15.0)
Immature Granulocytes: 6 %
Lymphocytes Relative: 24 %
Lymphs Abs: 2.3 K/uL (ref 0.7–4.0)
MCH: 30.5 pg (ref 26.0–34.0)
MCHC: 33.3 g/dL (ref 30.0–36.0)
MCV: 91.6 fL (ref 80.0–100.0)
Monocytes Absolute: 1.1 K/uL — ABNORMAL HIGH (ref 0.1–1.0)
Monocytes Relative: 11 %
Neutro Abs: 5.2 K/uL (ref 1.7–7.7)
Neutrophils Relative %: 55 %
Platelet Count: 209 K/uL (ref 150–400)
RBC: 3.83 MIL/uL — ABNORMAL LOW (ref 3.87–5.11)
RDW: 18.5 % — ABNORMAL HIGH (ref 11.5–15.5)
WBC Count: 9.5 K/uL (ref 4.0–10.5)
nRBC: 0.7 % — ABNORMAL HIGH (ref 0.0–0.2)

## 2024-09-13 LAB — CEA (ACCESS): CEA (CHCC): 2.57 ng/mL (ref 0.00–5.00)

## 2024-09-13 MED ORDER — DEXAMETHASONE 4 MG PO TABS
4.0000 mg | ORAL_TABLET | Freq: Two times a day (BID) | ORAL | 4 refills | Status: DC
  Filled 2024-09-13: qty 4, 2d supply, fill #0

## 2024-09-13 NOTE — Progress Notes (Signed)
 [facility] OFFICE PROGRESS NOTE   Diagnosis:  Colon cancer  INTERVAL HISTORY:   Erin Bradford returns for follow-up.  She completed cycle 6 FOLFOX 08/30/2024.  Restaging MRI has not been completed.  She continues to have intermittent nausea.  She began having diarrhea day 1 up to 10 times a day.  The diarrhea continued for about a week.  On 09/06/2024 she contacted the office to report a large amount of blood in the commode when she had a watery stool.  She has had no further bleeding.  She took an antidiarrheal medication with good results.  No mouth sores.  She has persistent cold sensitivity.  She has numbness and tingling in the fingertips without cold exposure.  She notes a change in visual acuity.  She continues to have tearing.  She has not seen her eye doctor.  Objective:  Vital signs in last 24 hours:  Blood pressure (!) 145/90, pulse 82, temperature 97.6 F (36.4 C), temperature source Temporal, resp. rate 18, height 5' 6 (1.676 m), weight 250 lb 8 oz (113.6 kg), last menstrual period 09/17/2017, SpO2 98%.    HEENT: No thrush or ulcers. Resp: Lungs clear bilaterally. Cardio: Regular rate and rhythm. GI: Abdomen soft and nontender.  No hepatomegaly. Vascular: No leg edema. Neuro: Vibratory sense intact over the fingertips per tuning fork exam. Skin: Palms without erythema. Port-A-Cath without erythema.  Lab Results:  Lab Results  Component Value Date   WBC 9.5 09/13/2024   HGB 11.7 (L) 09/13/2024   HCT 35.1 (L) 09/13/2024   MCV 91.6 09/13/2024   PLT 209 09/13/2024   NEUTROABS PENDING 09/13/2024    Imaging:  No results found.  Medications: I have reviewed the patient's current medications.  Assessment/Plan: Colon cancer, cecum, stage IIIb (pT4a, PN1a), Ileocecectomy 04/12/2024-moderately differentiated adenocarcinoma of the cecum, no macroscopic tumor perforation, carcinoma invades into the serosal surface, no lymphovascular perineural invasion,  negative resection margins, 1/3 nodes, 1 tumor deposit, normal mismatch repair protein expression, microsatellite stable CT abdomen/pelvis 04/11/2024: Dilated appendix with extensive periappendiceal inflammatory changes and a 5 cm fluid collection, no enlarged abdominal pelvic lymph nodes, unchanged 9 mm right adrenal nodule from 2018 CT chest 04/21/2024: 1.4 cm anterior mediastinal nodule or node, bilateral tiny nonspecific pulmonary nodules PET scan 06/08/2024-developing metastatic lesions right hemiabdominal mesenteric lymph nodes as well as at least 2 liver metastases.  Anterior superior mediastinal soft tissue nodule is similar to the previous chest CT, no abnormal uptake. Cycle 1 FOLFOX 06/12/2024 MRI liver 06/19/2024-numerous small rim-enhancing hypovascular masses seen throughout the liver.  Right lower quadrant mesenteric lymphadenopathy measuring 1.8 cm. Cycle 2 FOLFOX 06/25/2024 Cycle 3 FOLFOX 07/12/2024 Cycle 4 FOLFOX 07/26/2024, oxaliplatin  dose reduced due to persistent cold sensitivity and mild thrombocytopenia Cycle 5 FOLFOX 08/09/2024 Cycle 6 FOLFOX 08/30/2024 Treatment held 09/13/2024 due to neuropathy, diarrhea following cycle 6, tearing Depression Urinary frequency Hypertension Tremors Tachycardia Asthma Family history of breast and colon cancer Colonoscopy-needs colonoscopy approximately 6 months from diagnosis  Disposition: Erin Bradford appears stable.  She has completed 6 cycles of FOLFOX.  Restaging MRI has not been completed, now scheduled for next week.  She had severe diarrhea following cycle 6, neuropathy symptoms have progressed and she continues to have eye tearing.  We are holding today's treatment and rescheduling for 1 week.  She will follow-up with her eye doctor.  I reviewed the CBC and chemistry panel from today.  She will return for follow-up and treatment in 1 week.  We are  available to see her sooner if needed.  Plan reviewed with Dr. Cloretta.  Erin Bradford  ANP/GNP-BC   09/13/2024  8:43 AM

## 2024-09-15 ENCOUNTER — Inpatient Hospital Stay

## 2024-09-17 ENCOUNTER — Ambulatory Visit (HOSPITAL_COMMUNITY): Admission: RE | Admit: 2024-09-17 | Source: Ambulatory Visit

## 2024-09-17 ENCOUNTER — Telehealth: Payer: Self-pay | Admitting: Oncology

## 2024-09-17 NOTE — Telephone Encounter (Signed)
 PT isn't feeling well and wants to reschedule txt, let PT know that we don't stop Pumps on Sundays and that a Nurse would reach out to her to reschedule appt and what's best.

## 2024-09-18 NOTE — Progress Notes (Deleted)
 NEUROLOGY FOLLOW UP OFFICE NOTE  Erin Bradford [id number]  Assessment/Plan:   Essential tremor  Refilled primidone  50mg  in morning and 300mg  at bedtime Follow up one year  Subjective:  Erin Bradford is a 53 year old right-handed female with history of colon cancer s/p chemotherapy and ileocecectomy, hypertension, iron deficiency anemia, prediabetes, mild chronic kidney disease, and anxiety who follows up for essential tremor.   UPDATE: Current medication: primidone  50mg  in AM and 300mg  at bedtime.     ***     HISTORY: Beginning in 2017, she reports tremor in both hands.  She only notices it when she is performing a task, but not at rest.  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  She also reports difficulty pouring liquids in a spoon or using utensils.  It does not affect writing.  She denies any triggers or anything that improves symptoms.  She thinks it has gotten a little worse over the past year.  She denies starting any new medication prior to onset of symptoms.  She denies family history of tremor.  TSH was 0.92.     She endorses short term memory problems.  She frequently is experiencing word-finding difficulties.  This has been ongoing for about a year but has gotten worse over the past 3 months.  Other than the primidone , she has not had any changes or new medications.  She has not had trouble with finances or disorientation on familiar routes.  To evaluate memory deficits, B12 and TSH were checked, which were 663 and 1.05 respectively. It was determined that her memory problems were related to depression.  However, she subsequently felt it has gotten worse.  She reports that her mother has some memory issues but has not been diagnosed with dementia.  She reports that her grandmother developed some dementia at age 16.  She reports depression, anxiety and difficulty sleeping.   She was previously on topiramate  for anxiety and overeating, but her  psychiatrist stopped her.   PAST MEDICAL HISTORY: Past Medical History:  Diagnosis Date   Abnormal laboratory test    Acid reflux    Allergy    Anemia    Anxiety    Arthritis    back   Asthma    Blood transfusion without reported diagnosis    BMI 39.0-39.9,adult    Cancer (HCC)    Chronic hypokalemia    Chronic kidney disease    mild   Depression    GERD (gastroesophageal reflux disease)    H/O: hysterectomy    Heart murmur    Hypertension    Insomnia    Low back pain    Obesity    Occasional tremors    Palpitations    dx with tachycardia    PONV (postoperative nausea and vomiting)    Pre-diabetes    Sacroiliitis    Tachycardia    Tiredness    Urinary frequency     MEDICATIONS: Current Outpatient Medications on File Prior to Visit  Medication Sig Dispense Refill   acetaminophen  (TYLENOL ) 500 MG tablet Take 2 tablets (1,000 mg total) by mouth every 6 (six) hours.     albuterol  (PROVENTIL ) (2.5 MG/3ML) 0.083% nebulizer solution USE 1 VIAL VIA NEBULIZER EVERY 6 HOURS AS NEEDED FOR WHEEZING OR  SHORTNESS OF BREATH (Patient not taking: Reported on 08/09/2024) 75 mL 0   buPROPion  (WELLBUTRIN  XL) 300 MG 24 hr tablet Take 1 tablet (300 mg total) by mouth daily. 90 tablet 0  carvedilol  (COREG ) 25 MG tablet TAKE 1 TABLET BY MOUTH TWICE  DAILY 30 tablet 23   cetirizine  (ZYRTEC ) 10 MG tablet TAKE 1 TABLET(10 MG) BY MOUTH AT BEDTIME AS NEEDED FOR ALLERGIES 90 tablet 1   dexamethasone  (DECADRON ) 4 MG tablet Take 1 tablet (4 mg total) by mouth 2 (two) times daily with a meal. Take for 2 days beginning day pump is removed 4 tablet 4   famotidine  (PEPCID ) 20 MG tablet TAKE 1 TABLET(20 MG) BY MOUTH TWICE DAILY 60 tablet 3   fluticasone  (FLONASE ) 50 MCG/ACT nasal spray SHAKE LIQUID AND USE 2 SPRAYS IN EACH NOSTRIL DAILY 48 g 3   furosemide  (LASIX ) 20 MG tablet Take 20 mg by mouth 2 (two) times daily.      gabapentin  (NEURONTIN ) 300 MG capsule Take 600 mg by mouth 3 (three) times daily.      lidocaine -prilocaine  (EMLA ) cream Apply to port site 1-2 hours prior to use 30 g 3   loperamide  (IMODIUM  A-D) 2 MG tablet Take 1 tablet (2 mg total) by mouth 4 (four) times daily as needed for diarrhea or loose stools. (Patient not taking: Reported on 08/09/2024) 50 tablet 0   losartan  (COZAAR ) 100 MG tablet Take 100 mg by mouth daily.     magic mouthwash SOLN Take 5 mLs by mouth 4 (four) times daily as needed for mouth pain (swish x 1 minute and spit or swallow). (Patient not taking: Reported on 08/09/2024) 240 mL 1   ondansetron  (ZOFRAN ) 8 MG tablet Take 1 tablet (8 mg total) by mouth every 8 (eight) hours as needed for nausea or vomiting. Do not start until 72 hours after day 1 chemo 40 tablet 3   oxybutynin  (DITROPAN -XL) 10 MG 24 hr tablet TAKE 1 TABLET BY MOUTH AT  BEDTIME 100 tablet 2   oxyCODONE  (ROXICODONE ) 5 MG immediate release tablet Take 1 tablet (5 mg total) by mouth every 6 (six) hours as needed for severe pain (pain score 7-10). 10 tablet 0   primidone  (MYSOLINE ) 50 MG tablet TAKE 1 TABLET BY MOUTH EVERY MORNING AND 6 TABLETS EVERY EVENING 210 tablet 3   prochlorperazine  (COMPAZINE ) 10 MG tablet Take 1 tablet (10 mg total) by mouth every 6 (six) hours as needed for nausea or vomiting. 30 tablet 3   sertraline  (ZOLOFT ) 100 MG tablet Take 1 tablet (100 mg total) by mouth daily. 90 tablet 0   spironolactone  (ALDACTONE ) 100 MG tablet Take 100 mg by mouth 2 (two) times daily.     SYMBICORT  80-4.5 MCG/ACT inhaler USE 2 INHALATIONS BY MOUTH TWICE DAILY 30.6 g 3   tiZANidine  (ZANAFLEX ) 4 MG tablet Take 4 mg by mouth 3 (three) times daily as needed for muscle spasms.     VENTOLIN  HFA 108 (90 Base) MCG/ACT inhaler USE 2 INHALATIONS BY MOUTH EVERY 6 HOURS AS NEEDED FOR WHEEZING  OR SHORTNESS OF BREATH (Patient not taking: Reported on 08/09/2024) 54 g 3   zolpidem  (AMBIEN  CR) 12.5 MG CR tablet Take 1 tablet (12.5 mg total) by mouth at bedtime as needed for sleep. 30 tablet 2   Current  Facility-Administered Medications on File Prior to Visit  Medication Dose Route Frequency Provider Last Rate Last Admin   dextrose  5 % solution   Intravenous Continuous Cloretta Arley NOVAK, MD   Stopped at 07/26/24 1540    ALLERGIES: Allergies  Allergen Reactions   Ace Inhibitors Anaphylaxis and Swelling    Swelling of tongue.    FAMILY HISTORY: Family History  Problem Relation Age of Onset   Diabetes Mother    Hypertension Mother    Hyperlipidemia Mother    Sarcoidosis Mother    Kidney disease Father    Atrial fibrillation Sister    Kidney disease Maternal Grandmother    Pancreatic cancer Maternal Grandmother    Diabetes Maternal Grandmother    Hypertension Maternal Grandmother    Cancer Maternal Grandfather    Colon cancer Neg Hx    Colon polyps Neg Hx    Esophageal cancer Neg Hx    Stomach cancer Neg Hx    Rectal cancer Neg Hx       Objective:  *** General: No acute distress.  Patient appears ***-groomed.   Head:  Normocephalic/atraumatic Eyes:  Fundi examined but not visualized Neck: supple, no paraspinal tenderness, full range of motion Heart:  Regular rate and rhythm Neurological Exam: alert and oriented.  Speech fluent and not dysarthric, language intact.  CN II-XII intact. Bulk and tone normal, muscle strength 5/5 throughout.  Sensation to light touch intact.  Deep tendon reflexes 2+ throughout, toes downgoing.  Finger to nose testing intact.  Gait normal, Romberg negative.   Juliene Dunnings, DO  CC: ***

## 2024-09-19 ENCOUNTER — Ambulatory Visit: Admitting: Neurology

## 2024-09-19 ENCOUNTER — Encounter: Payer: Self-pay | Admitting: Neurology

## 2024-09-20 ENCOUNTER — Inpatient Hospital Stay

## 2024-09-20 ENCOUNTER — Inpatient Hospital Stay: Admitting: Oncology

## 2024-09-21 ENCOUNTER — Ambulatory Visit (HOSPITAL_COMMUNITY)
Admission: RE | Admit: 2024-09-21 | Discharge: 2024-09-21 | Disposition: A | Discharge status: Home / Self Care | Source: Ambulatory Visit | Attending: Oncology | Admitting: Oncology

## 2024-09-21 DIAGNOSIS — C182 Malignant neoplasm of ascending colon: Secondary | ICD-10-CM | POA: Diagnosis present

## 2024-09-21 MED ORDER — HEPARIN SOD (PORK) LOCK FLUSH 100 UNIT/ML IV SOLN
500.0000 [IU] | INTRAVENOUS | Status: AC | PRN
  Administered 2024-09-21: 500 [IU]

## 2024-09-21 MED ORDER — GADOBUTROL 1 MMOL/ML IV SOLN
10.0000 mL | Freq: Once | INTRAVENOUS | Status: AC | PRN
Start: 2024-09-21 — End: 2024-09-21
  Administered 2024-09-21: 10 mL via INTRAVENOUS

## 2024-09-22 ENCOUNTER — Encounter

## 2024-09-24 ENCOUNTER — Ambulatory Visit: Admitting: Neurology

## 2024-09-24 ENCOUNTER — Inpatient Hospital Stay (HOSPITAL_BASED_OUTPATIENT_CLINIC_OR_DEPARTMENT_OTHER): Admitting: Nurse Practitioner

## 2024-09-24 ENCOUNTER — Other Ambulatory Visit (HOSPITAL_BASED_OUTPATIENT_CLINIC_OR_DEPARTMENT_OTHER): Payer: Self-pay

## 2024-09-24 ENCOUNTER — Inpatient Hospital Stay

## 2024-09-24 ENCOUNTER — Encounter: Payer: Self-pay | Admitting: Nurse Practitioner

## 2024-09-24 VITALS — BP 115/63 | HR 65 | Temp 98.2°F | Resp 18 | Ht 66.0 in | Wt 256.8 lb

## 2024-09-24 VITALS — BP 156/83 | HR 67 | Resp 18

## 2024-09-24 DIAGNOSIS — C182 Malignant neoplasm of ascending colon: Secondary | ICD-10-CM

## 2024-09-24 DIAGNOSIS — Z5111 Encounter for antineoplastic chemotherapy: Secondary | ICD-10-CM | POA: Diagnosis not present

## 2024-09-24 LAB — CBC WITH DIFFERENTIAL (CANCER CENTER ONLY)
Abs Immature Granulocytes: 0.04 K/uL (ref 0.00–0.07)
Basophils Absolute: 0 K/uL (ref 0.0–0.1)
Basophils Relative: 1 %
Eosinophils Absolute: 0.2 K/uL (ref 0.0–0.5)
Eosinophils Relative: 3 %
HCT: 33.1 % — ABNORMAL LOW (ref 36.0–46.0)
Hemoglobin: 10.9 g/dL — ABNORMAL LOW (ref 12.0–15.0)
Immature Granulocytes: 1 %
Lymphocytes Relative: 34 %
Lymphs Abs: 2.1 K/uL (ref 0.7–4.0)
MCH: 30.8 pg (ref 26.0–34.0)
MCHC: 32.9 g/dL (ref 30.0–36.0)
MCV: 93.5 fL (ref 80.0–100.0)
Monocytes Absolute: 1.1 K/uL — ABNORMAL HIGH (ref 0.1–1.0)
Monocytes Relative: 17 %
Neutro Abs: 2.7 K/uL (ref 1.7–7.7)
Neutrophils Relative %: 44 %
Platelet Count: 242 K/uL (ref 150–400)
RBC: 3.54 MIL/uL — ABNORMAL LOW (ref 3.87–5.11)
RDW: 17.1 % — ABNORMAL HIGH (ref 11.5–15.5)
WBC Count: 6.1 K/uL (ref 4.0–10.5)
nRBC: 0 % (ref 0.0–0.2)

## 2024-09-24 LAB — CMP (CANCER CENTER ONLY)
ALT: 24 U/L (ref 0–44)
AST: 32 U/L (ref 15–41)
Albumin: 4.2 g/dL (ref 3.5–5.0)
Alkaline Phosphatase: 148 U/L — ABNORMAL HIGH (ref 38–126)
Anion gap: 12 (ref 5–15)
BUN: 6 mg/dL (ref 6–20)
CO2: 26 mmol/L (ref 22–32)
Calcium: 10 mg/dL (ref 8.9–10.3)
Chloride: 101 mmol/L (ref 98–111)
Creatinine: 0.8 mg/dL (ref 0.44–1.00)
GFR, Estimated: >60 mL/min (ref >60)
Glucose, Bld: 132 mg/dL — ABNORMAL HIGH (ref 70–99)
Potassium: 3.7 mmol/L (ref 3.5–5.1)
Sodium: 139 mmol/L (ref 135–145)
Total Bilirubin: 0.3 mg/dL (ref 0.0–1.2)
Total Protein: 6.9 g/dL (ref 6.5–8.1)

## 2024-09-24 LAB — CEA (ACCESS): CEA (CHCC): 2.12 ng/mL (ref 0.00–5.00)

## 2024-09-24 MED ORDER — DEXTROSE 5 % IV SOLN: INTRAVENOUS | Status: DC

## 2024-09-24 MED ORDER — PALONOSETRON HCL INJECTION 0.25 MG/5ML
0.2500 mg | Freq: Once | INTRAVENOUS | Status: AC
  Administered 2024-09-24: 0.25 mg via INTRAVENOUS
  Filled 2024-09-24: qty 5

## 2024-09-24 MED ORDER — OXALIPLATIN CHEMO INJECTION 100 MG/20ML
65.0000 mg/m2 | Freq: Once | INTRAVENOUS | Status: AC
  Administered 2024-09-24: 150 mg via INTRAVENOUS
  Filled 2024-09-24: qty 20

## 2024-09-24 MED ORDER — FLUOROURACIL CHEMO INJECTION 2.5 GM/50ML
300.0000 mg/m2 | Freq: Once | INTRAVENOUS | Status: AC
  Administered 2024-09-24: 700 mg via INTRAVENOUS
  Filled 2024-09-24: qty 14

## 2024-09-24 MED ORDER — LEUCOVORIN CALCIUM INJECTION 350 MG
300.0000 mg/m2 | Freq: Once | INTRAVENOUS | Status: AC
  Administered 2024-09-24: 684 mg via INTRAVENOUS
  Filled 2024-09-24 (×2): qty 34.2

## 2024-09-24 MED ORDER — SODIUM CHLORIDE 0.9 % IV SOLN
1800.0000 mg/m2 | INTRAVENOUS | Status: DC
  Administered 2024-09-24: 4100 mg via INTRAVENOUS
  Filled 2024-09-24: qty 82

## 2024-09-24 MED ORDER — DEXAMETHASONE SODIUM PHOSPHATE 10 MG/ML IJ SOLN
10.0000 mg | Freq: Once | INTRAMUSCULAR | Status: AC
  Administered 2024-09-24: 10 mg via INTRAVENOUS
  Filled 2024-09-24: qty 1

## 2024-09-24 NOTE — Progress Notes (Signed)
 Patient seen by Olam Ned NP today  Vitals are within treatment parameters:Yes   Labs are within treatment parameters: Yes   Treatment plan has been signed: Yes   Per physician team, Patient is ready for treatment. Please note the following modifications: 5-fluorouracil  will be dose reduced due to diarrhea and tearing.

## 2024-09-24 NOTE — Patient Instructions (Signed)
 [facility] - A DEPT OF [facility]  Discharge Instructions: Thank you for choosing [facility] to provide your oncology and hematology care.   If you have a lab appointment with the Cancer Center, please go directly to the Cancer Center and check in at the registration area.   Wear comfortable clothing and clothing appropriate for easy access to any Portacath or PICC line.   We strive to give you quality time with your provider. You may need to reschedule your appointment if you arrive late (15 or more minutes).  Arriving late affects you and other patients whose appointments are after yours.  Also, if you miss three or more appointments without notifying the office, you may be dismissed from the clinic at the provider's discretion.      For prescription refill requests, have your pharmacy contact our office and allow 72 hours for refills to be completed.    Today you received the following chemotherapy and/or immunotherapy agents: oxaliplatin , leucovorin , fluorouracil        To help prevent nausea and vomiting after your treatment, we encourage you to take your nausea medication as directed.  BELOW ARE SYMPTOMS THAT SHOULD BE REPORTED IMMEDIATELY: *FEVER GREATER THAN 100.4 F (38 C) OR HIGHER *CHILLS OR SWEATING *NAUSEA AND VOMITING THAT IS NOT CONTROLLED WITH YOUR NAUSEA MEDICATION *UNUSUAL SHORTNESS OF BREATH *UNUSUAL BRUISING OR BLEEDING *URINARY PROBLEMS (pain or burning when urinating, or frequent urination) *BOWEL PROBLEMS (unusual diarrhea, constipation, pain near the anus) TENDERNESS IN MOUTH AND THROAT WITH OR WITHOUT PRESENCE OF ULCERS (sore throat, sores in mouth, or a toothache) UNUSUAL RASH, SWELLING OR PAIN  UNUSUAL VAGINAL DISCHARGE OR ITCHING   Items with * indicate a potential emergency and should be followed up as soon as possible or go to the Emergency Department if any problems should occur.  Please show the CHEMOTHERAPY  ALERT CARD or IMMUNOTHERAPY ALERT CARD at check-in to the Emergency Department and triage nurse.  Should you have questions after your visit or need to cancel or reschedule your appointment, please contact [facility] - A DEPT OF [facility]  Dept: [phone]  and follow the prompts.  Office hours are 8:00 a.m. to 4:30 p.m. Monday - Friday. Please note that voicemails left after 4:00 p.m. may not be returned until the following business day.  We are closed weekends and major holidays. You have access to a nurse at all times for urgent questions. Please call the main number to the clinic Dept: [phone] and follow the prompts.   For any non-urgent questions, you may also contact your provider using MyChart. We now offer e-Visits for anyone 22 and older to request care online for non-urgent symptoms. For details visit [email or web address].   Also download the MyChart app! Go to the app store, search MyChart, open the app, select [facility], and log in with your MyChart username and password.

## 2024-09-24 NOTE — Patient Instructions (Signed)

## 2024-09-24 NOTE — Progress Notes (Signed)
 [name] OFFICE PROGRESS NOTE   Diagnosis: Colon cancer  INTERVAL HISTORY:   Erin Bradford returns for follow-up.  She completed cycle 6 FOLFOX 08/30/2024.  Treatment was held 09/13/2024 due to neuropathy, diarrhea, tearing.  She canceled the treatment which have been rescheduled to 09/20/2024.  She notes less tearing.  She has an appointment with the eye doctor in the next 2 weeks.  No further diarrhea.  No mouth sores.  No nausea or vomiting.  Appetite is better.  She is gaining weight.  Cold sensitivity is less.  No persistent neuropathy symptoms.  Continued left shoulder pain.  Objective:  Vital signs in last 24 hours:  Blood pressure 115/63, pulse 65, temperature 98.2 F (36.8 C), temperature source Oral, resp. rate 18, height 5' 6 (1.676 m), weight 256 lb 12.8 oz (116.5 kg), last menstrual period 09/17/2017, SpO2 97%.    HEENT: No thrush or ulcers. Resp: Lungs clear bilaterally. Cardio: Regular rate and rhythm. GI: No hepatosplenomegaly.  Nontender. Vascular: No leg edema. Neuro: Vibratory sense very minimally decreased over the fingertips per tuning fork exam. Skin: Palms without erythema. Port-A-Cath without erythema.  Lab Results:  Lab Results  Component Value Date   WBC 6.1 09/24/2024   HGB 10.9 (L) 09/24/2024   HCT 33.1 (L) 09/24/2024   MCV 93.5 09/24/2024   PLT 242 09/24/2024   NEUTROABS 2.7 09/24/2024    Imaging:  No results found.  Medications: I have reviewed the patient's current medications.  Assessment/Plan: Colon cancer, cecum, stage IIIb (pT4a, PN1a), Ileocecectomy 04/12/2024-moderately differentiated adenocarcinoma of the cecum, no macroscopic tumor perforation, carcinoma invades into the serosal surface, no lymphovascular perineural invasion, negative resection margins, 1/3 nodes, 1 tumor deposit, normal mismatch repair protein expression, microsatellite stable CT abdomen/pelvis 04/11/2024: Dilated appendix with extensive  periappendiceal inflammatory changes and a 5 cm fluid collection, no enlarged abdominal pelvic lymph nodes, unchanged 9 mm right adrenal nodule from 2018 CT chest 04/21/2024: 1.4 cm anterior mediastinal nodule or node, bilateral tiny nonspecific pulmonary nodules PET scan 06/08/2024-developing metastatic lesions right hemiabdominal mesenteric lymph nodes as well as at least 2 liver metastases.  Anterior superior mediastinal soft tissue nodule is similar to the previous chest CT, no abnormal uptake. Cycle 1 FOLFOX 06/12/2024 MRI liver 06/19/2024-numerous small rim-enhancing hypovascular masses seen throughout the liver.  Right lower quadrant mesenteric lymphadenopathy measuring 1.8 cm. Cycle 2 FOLFOX 06/25/2024 Cycle 3 FOLFOX 07/12/2024 Cycle 4 FOLFOX 07/26/2024, oxaliplatin  dose reduced due to persistent cold sensitivity and mild thrombocytopenia Cycle 5 FOLFOX 08/09/2024 Cycle 6 FOLFOX 08/30/2024 Treatment held 09/13/2024 due to neuropathy, diarrhea following cycle 6, tearing MRI 09/21/2024-significant interval decrease in size and conspicuity of the numerous bilobar hepatic lesions.  No new lesions identified.  Decreased size of right lower quadrant mesenteric lymph nodes. Cycle 7 FOLFOX 09/24/2024, 5-fluorouracil  dose reduced due to diarrhea and tearing Depression Urinary frequency Hypertension Tremors Tachycardia Asthma Family history of breast and colon cancer Colonoscopy-needs colonoscopy approximately 6 months from diagnosis  Disposition: Erin Bradford appears stable.  She has completed 6 cycles of FOLFOX.  Recent restaging abdominal MRI shows improvement in liver lesions and mesenteric lymph nodes.  Results/images reviewed with her at today's visit.  She understands and agrees with the recommendation to continue the current treatment.  Plan to proceed with cycle 7 today as scheduled.  5-fluorouracil  will be dose reduced due to diarrhea and tearing.  CBC and chemistry panel reviewed.  Labs adequate  for treatment.  CEA is lower in the normal range.  She will return for follow-up and treatment in 2 weeks.  We are available to see her sooner if needed.  Patient seen with Dr. Cloretta.    Olam Ned ANP/GNP-BC   09/24/2024  9:10 AM  This was a shared visit with Olam Ned.  We reviewed the restaging CT findings and images with Erin Bradford.  The CTs are consistent with a response to chemotherapy.  The plan is to continue FOLFOX.  The 5-FU will be dose reduced secondary to increased tearing and diarrhea.  I was present for greater than 50% of today's visit.  I performed medical decision making.  Arvella Cloretta, MD

## 2024-09-26 ENCOUNTER — Other Ambulatory Visit

## 2024-09-26 ENCOUNTER — Ambulatory Visit: Admitting: Nurse Practitioner

## 2024-09-26 ENCOUNTER — Ambulatory Visit

## 2024-09-26 ENCOUNTER — Inpatient Hospital Stay: Attending: Oncology

## 2024-09-26 VITALS — BP 116/73 | HR 64 | Temp 97.6°F | Resp 18

## 2024-09-26 DIAGNOSIS — R197 Diarrhea, unspecified: Secondary | ICD-10-CM | POA: Diagnosis not present

## 2024-09-26 DIAGNOSIS — M545 Low back pain, unspecified: Secondary | ICD-10-CM | POA: Diagnosis not present

## 2024-09-26 DIAGNOSIS — G8929 Other chronic pain: Secondary | ICD-10-CM | POA: Insufficient documentation

## 2024-09-26 DIAGNOSIS — Z23 Encounter for immunization: Secondary | ICD-10-CM | POA: Diagnosis not present

## 2024-09-26 DIAGNOSIS — Z803 Family history of malignant neoplasm of breast: Secondary | ICD-10-CM | POA: Diagnosis not present

## 2024-09-26 DIAGNOSIS — R918 Other nonspecific abnormal finding of lung field: Secondary | ICD-10-CM | POA: Diagnosis not present

## 2024-09-26 DIAGNOSIS — C787 Secondary malignant neoplasm of liver and intrahepatic bile duct: Secondary | ICD-10-CM | POA: Insufficient documentation

## 2024-09-26 DIAGNOSIS — Z800 Family history of malignant neoplasm of digestive organs: Secondary | ICD-10-CM | POA: Diagnosis not present

## 2024-09-26 DIAGNOSIS — D696 Thrombocytopenia, unspecified: Secondary | ICD-10-CM | POA: Insufficient documentation

## 2024-09-26 DIAGNOSIS — Z5189 Encounter for other specified aftercare: Secondary | ICD-10-CM | POA: Insufficient documentation

## 2024-09-26 DIAGNOSIS — Z5111 Encounter for antineoplastic chemotherapy: Secondary | ICD-10-CM | POA: Diagnosis present

## 2024-09-26 DIAGNOSIS — C180 Malignant neoplasm of cecum: Secondary | ICD-10-CM | POA: Insufficient documentation

## 2024-09-26 DIAGNOSIS — R350 Frequency of micturition: Secondary | ICD-10-CM | POA: Diagnosis not present

## 2024-09-26 DIAGNOSIS — C182 Malignant neoplasm of ascending colon: Secondary | ICD-10-CM

## 2024-09-26 DIAGNOSIS — G629 Polyneuropathy, unspecified: Secondary | ICD-10-CM | POA: Diagnosis not present

## 2024-09-26 MED ORDER — PEGFILGRASTIM-CBQV 6 MG/0.6ML ~~LOC~~ SOSY
6.0000 mg | PREFILLED_SYRINGE | Freq: Once | SUBCUTANEOUS | Status: AC
  Administered 2024-09-26: 6 mg via SUBCUTANEOUS
  Filled 2024-09-26: qty 0.6

## 2024-09-26 MED ORDER — SODIUM CHLORIDE 0.9% FLUSH
10.0000 mL | INTRAVENOUS | Status: DC | PRN
  Administered 2024-09-26: 10 mL

## 2024-09-26 NOTE — Patient Instructions (Signed)

## 2024-09-28 ENCOUNTER — Encounter

## 2024-09-30 ENCOUNTER — Other Ambulatory Visit: Payer: Self-pay | Admitting: Student

## 2024-10-01 ENCOUNTER — Telehealth: Payer: Self-pay | Admitting: Oncology

## 2024-10-01 NOTE — Telephone Encounter (Signed)
 PT called with questions about her grant. Reached out to a couple of SW to get her questions answered.

## 2024-10-02 ENCOUNTER — Encounter (HOSPITAL_COMMUNITY): Payer: Self-pay | Admitting: Psychiatry

## 2024-10-02 ENCOUNTER — Telehealth (HOSPITAL_COMMUNITY): Admitting: Psychiatry

## 2024-10-02 VITALS — Wt 256.0 lb

## 2024-10-02 DIAGNOSIS — F330 Major depressive disorder, recurrent, mild: Secondary | ICD-10-CM | POA: Diagnosis not present

## 2024-10-02 DIAGNOSIS — F5101 Primary insomnia: Secondary | ICD-10-CM | POA: Diagnosis not present

## 2024-10-02 DIAGNOSIS — F411 Generalized anxiety disorder: Secondary | ICD-10-CM

## 2024-10-02 MED ORDER — SERTRALINE HCL 100 MG PO TABS: 100.0000 mg | ORAL_TABLET | Freq: Every day | ORAL | 0 refills | Status: DC

## 2024-10-02 MED ORDER — BUPROPION HCL ER (XL) 300 MG PO TB24: 300.0000 mg | ORAL_TABLET | Freq: Every day | ORAL | 0 refills | Status: DC

## 2024-10-02 MED ORDER — ZOLPIDEM TARTRATE ER 12.5 MG PO TBCR: 12.5000 mg | EXTENDED_RELEASE_TABLET | Freq: Every evening | ORAL | 2 refills | Status: DC | PRN

## 2024-10-02 NOTE — Progress Notes (Signed)
 [facility] MD Virtual Progress Note   Patient Location: Home Provider Location: Home Office  I connect with patient by video and verified that I am speaking with correct person by using two identifiers. I discussed the limitations of evaluation and management by telemedicine and the availability of in person appointments. I also discussed with the patient that there may be a patient responsible charge related to this service. The patient expressed understanding and agreed to proceed.  Erin Bradford [id number] 53 y.o.  10/02/2024 10:11 AM  History of Present Illness:  Patient is evaluated by video session.  She reported a lot of fatigue and tiredness because of chemotherapy.  She has so far 6 cycles and 6 more to go.  However patient told recently had an MRI and it shows better results.  Patient diagnosed with GI cancer.  She had a good support from her daughter.  Patient is not driving but her daughter takes her to the doctor's appointment.  She is sleeping good with the help of Ambien .  She is compliant with Wellbutrin  and Zoloft .  She denies any irritability, mania, anger, hopelessness.  She denies any suicidal thoughts or homicidal thoughts.  She had a good support from her family members.  Her mother also takes her to the doctor's appointment when daughter is busy.  She started therapy with [facility] at cancer center but does not see her every week.  She feel whenever she needed she can talk to the therapist.  She reported her appetite is slowly getting better as in the beginning of chemotherapy she had no appetite.  She reported some side effects of chemotherapy medicine more noticeable neuropathy and tingling.  She also has mild tremors.  Overall she feels hopeless.  She does not want to change the medication.  She denies any hallucination, paranoia.  Past Psychiatric History: H/O depression.  No h/o inpatient, mania, psychosis or suicidal attempt.  Tried Prozac  which worked  but had weight gain.  Tried Vistaril , trazodone , Thorazine , Lunesta  3 mg, Doxepin  50 mg and saphris  5 mg with poor outcome.  Lamictal  caused rash and temazepam  caused headache.    Past Medical History:  Diagnosis Date   Abnormal laboratory test    Acid reflux    Allergy    Anemia    Anxiety    Arthritis    back   Asthma    Blood transfusion without reported diagnosis    BMI 39.0-39.9,adult    Cancer (HCC)    Chronic hypokalemia    Chronic kidney disease    mild   Depression    GERD (gastroesophageal reflux disease)    H/O: hysterectomy    Heart murmur    Hypertension    Insomnia    Low back pain    Obesity    Occasional tremors    Palpitations    dx with tachycardia    PONV (postoperative nausea and vomiting)    Pre-diabetes    Sacroiliitis    Tachycardia    Tiredness    Urinary frequency     Outpatient Encounter Medications as of 10/02/2024  Medication Sig   acetaminophen  (TYLENOL ) 500 MG tablet Take 2 tablets (1,000 mg total) by mouth every 6 (six) hours.   albuterol  (PROVENTIL ) (2.5 MG/3ML) 0.083% nebulizer solution USE 1 VIAL VIA NEBULIZER EVERY 6 HOURS AS NEEDED FOR WHEEZING OR  SHORTNESS OF BREATH (Patient not taking: Reported on 09/24/2024)   buPROPion  (WELLBUTRIN  XL) 300 MG 24 hr tablet Take 1 tablet (300  mg total) by mouth daily.   carvedilol  (COREG ) 25 MG tablet TAKE 1 TABLET BY MOUTH TWICE  DAILY   cetirizine  (ZYRTEC ) 10 MG tablet TAKE 1 TABLET(10 MG) BY MOUTH AT BEDTIME AS NEEDED FOR ALLERGIES   dexamethasone  (DECADRON ) 4 MG tablet Take 1 tablet (4 mg total) by mouth 2 (two) times daily with a meal. Take for 2 days beginning day pump is removed   famotidine  (PEPCID ) 20 MG tablet TAKE 1 TABLET(20 MG) BY MOUTH TWICE DAILY   fluticasone  (FLONASE ) 50 MCG/ACT nasal spray SHAKE LIQUID AND USE 2 SPRAYS IN EACH NOSTRIL DAILY   furosemide  (LASIX ) 20 MG tablet Take 20 mg by mouth 2 (two) times daily.    gabapentin  (NEURONTIN ) 300 MG capsule Take 600 mg by mouth 3  (three) times daily.   lidocaine -prilocaine  (EMLA ) cream Apply to port site 1-2 hours prior to use   loperamide  (IMODIUM  A-D) 2 MG tablet Take 1 tablet (2 mg total) by mouth 4 (four) times daily as needed for diarrhea or loose stools.   losartan  (COZAAR ) 100 MG tablet Take 100 mg by mouth daily.   magic mouthwash SOLN Take 5 mLs by mouth 4 (four) times daily as needed for mouth pain (swish x 1 minute and spit or swallow). (Patient not taking: Reported on 09/24/2024)   ondansetron  (ZOFRAN ) 8 MG tablet Take 1 tablet (8 mg total) by mouth every 8 (eight) hours as needed for nausea or vomiting. Do not start until 72 hours after day 1 chemo   oxybutynin  (DITROPAN -XL) 10 MG 24 hr tablet TAKE 1 TABLET BY MOUTH AT  BEDTIME   oxyCODONE  (ROXICODONE ) 5 MG immediate release tablet Take 1 tablet (5 mg total) by mouth every 6 (six) hours as needed for severe pain (pain score 7-10).   primidone  (MYSOLINE ) 50 MG tablet TAKE 1 TABLET BY MOUTH EVERY MORNING AND 6 TABLETS EVERY EVENING   prochlorperazine  (COMPAZINE ) 10 MG tablet Take 1 tablet (10 mg total) by mouth every 6 (six) hours as needed for nausea or vomiting.   sertraline  (ZOLOFT ) 100 MG tablet Take 1 tablet (100 mg total) by mouth daily.   spironolactone  (ALDACTONE ) 100 MG tablet Take 100 mg by mouth 2 (two) times daily.   SYMBICORT  80-4.5 MCG/ACT inhaler USE 2 INHALATIONS BY MOUTH TWICE DAILY   tiZANidine  (ZANAFLEX ) 4 MG tablet Take 4 mg by mouth 3 (three) times daily as needed for muscle spasms.   VENTOLIN  HFA 108 (90 Base) MCG/ACT inhaler USE 2 INHALATIONS BY MOUTH EVERY 6 HOURS AS NEEDED FOR WHEEZING  OR SHORTNESS OF BREATH   zolpidem  (AMBIEN  CR) 12.5 MG CR tablet Take 1 tablet (12.5 mg total) by mouth at bedtime as needed for sleep. (Patient not taking: Reported on 09/24/2024)   Facility-Administered Encounter Medications as of 10/02/2024  Medication   dextrose  5 % solution    Recent Results (from the past 2160 hours)  CMP (Cancer Center only)      Status: Abnormal   Collection Time: 07/12/24  8:28 AM  Result Value Ref Range   Sodium 138 135 - 145 mmol/L   Potassium 3.8 3.5 - 5.1 mmol/L    Comment: HEMOLYSIS AT THIS LEVEL MAY AFFECT RESULT   Chloride 101 98 - 111 mmol/L   CO2 25 22 - 32 mmol/L   Glucose, Bld 149 (H) 70 - 99 mg/dL    Comment: Glucose reference range applies only to samples taken after fasting for at least 8 hours.   BUN 13 6 - 20  mg/dL   Creatinine 8.98 (H) 9.55 - 1.00 mg/dL   Calcium  9.7 8.9 - 10.3 mg/dL   Total Protein 7.2 6.5 - 8.1 g/dL   Albumin  4.4 3.5 - 5.0 g/dL   AST 32 15 - 41 U/L    Comment: HEMOLYSIS AT THIS LEVEL MAY AFFECT RESULT   ALT 22 0 - 44 U/L   Alkaline Phosphatase 135 (H) 38 - 126 U/L   Total Bilirubin 0.3 0.0 - 1.2 mg/dL   GFR, Estimated >39 >39 mL/min    Comment: (NOTE) Calculated using the CKD-EPI Creatinine Equation (2021)    Anion gap 12 5 - 15    Comment: Performed at [facility], [address], [place], [place] [address]  CBC with Differential (Cancer Center Only)     Status: Abnormal   Collection Time: 07/12/24  8:28 AM  Result Value Ref Range   WBC Count 4.3 4.0 - 10.5 K/uL   RBC 4.19 3.87 - 5.11 MIL/uL   Hemoglobin 11.7 (L) 12.0 - 15.0 g/dL   HCT 63.7 63.9 - 53.9 %   MCV 86.4 80.0 - 100.0 fL   MCH 27.9 26.0 - 34.0 pg   MCHC 32.3 30.0 - 36.0 g/dL   RDW 83.7 (H) 88.4 - 84.4 %   Platelet Count 252 150 - 400 K/uL   nRBC 0.0 0.0 - 0.2 %   Neutrophils Relative % 38 %   Neutro Abs 1.7 1.7 - 7.7 K/uL   Lymphocytes Relative 42 %   Lymphs Abs 1.8 0.7 - 4.0 K/uL   Monocytes Relative 15 %   Monocytes Absolute 0.7 0.1 - 1.0 K/uL   Eosinophils Relative 4 %   Eosinophils Absolute 0.2 0.0 - 0.5 K/uL   Basophils Relative 1 %   Basophils Absolute 0.1 0.0 - 0.1 K/uL   Immature Granulocytes 0 %   Abs Immature Granulocytes 0.01 0.00 - 0.07 K/uL    Comment: Performed at [facility], [address], [place], [place] [address]  [facility]A (Access[facility]  ONLY     Status: Abnormal   Collection Time: 07/12/24  8:28 AM  Result Value Ref Range   CEA ([facility]) 14.32 (H) 0.00 - 5.00 ng/mL    Comment: (NOTE) This test was performed using [employer] Coulter's paramagnetic chemiluminescent immunoassay. Values obtained from different assay methods cannot be used interchangeably. Please note that up to 8% of patients who smoke may see values 5.1-10.0 ng/ml and 1% of patients who smoke may see CEA levels >10.0 ng/ml. Performed at [facility], [address], [place], [place] [address]   CMP (Cancer Center only)     Status: Abnormal   Collection Time: 07/26/24 10:48 AM  Result Value Ref Range   Sodium 139 135 - 145 mmol/L   Potassium 3.8 3.5 - 5.1 mmol/L   Chloride 100 98 - 111 mmol/L   CO2 27 22 - 32 mmol/L   Glucose, Bld 151 (H) 70 - 99 mg/dL    Comment: Glucose reference range applies only to samples taken after fasting for at least 8 hours.   BUN 7 6 - 20 mg/dL   Creatinine 8.94 (H) 9.55 - 1.00 mg/dL   Calcium  9.9 8.9 - 10.3 mg/dL   Total Protein 7.0 6.5 - 8.1 g/dL   Albumin  4.3 3.5 - 5.0 g/dL   AST 38 15 - 41 U/L   ALT 28 0 - 44 U/L   Alkaline Phosphatase 188 (H) 38 - 126 U/L   Total Bilirubin 0.3 0.0 -  1.2 mg/dL   GFR, Estimated >39 >39 mL/min    Comment: (NOTE) Calculated using the CKD-EPI Creatinine Equation (2021)    Anion gap 12 5 - 15    Comment: Performed at [facility], [address], [place], [place] [address]  CBC with Differential (Cancer Center Only)     Status: Abnormal   Collection Time: 07/26/24 10:48 AM  Result Value Ref Range   WBC Count 13.8 (H) 4.0 - 10.5 K/uL   RBC 4.15 3.87 - 5.11 MIL/uL   Hemoglobin 11.8 (L) 12.0 - 15.0 g/dL   HCT 64.0 (L) 63.9 - 53.9 %   MCV 86.5 80.0 - 100.0 fL   MCH 28.4 26.0 - 34.0 pg   MCHC 32.9 30.0 - 36.0 g/dL   RDW 82.7 (H) 88.4 - 84.4 %   Platelet Count 93 (L) 150 - 400 K/uL    Comment: REPEATED TO VERIFY SPECIMEN CHECKED FOR CLOTS    nRBC  0.8 (H) 0.0 - 0.2 %   Neutrophils Relative % 55 %   Neutro Abs 7.7 1.7 - 7.7 K/uL   Lymphocytes Relative 24 %   Lymphs Abs 3.3 0.7 - 4.0 K/uL   Monocytes Relative 8 %   Monocytes Absolute 1.1 (H) 0.1 - 1.0 K/uL   Eosinophils Relative 2 %   Eosinophils Absolute 0.2 0.0 - 0.5 K/uL   Basophils Relative 1 %   Basophils Absolute 0.2 (H) 0.0 - 0.1 K/uL   Immature Granulocytes 10 %   Abs Immature Granulocytes 1.39 (H) 0.00 - 0.07 K/uL    Comment: Performed at [facility], [address] [place], [place], [place] [address]  CEA (Access)     Status: Abnormal   Collection Time: 07/26/24 10:48 AM  Result Value Ref Range   CEA ([facility]) 5.98 (H) 0.00 - 5.00 ng/mL    Comment: (NOTE) This test was performed using [employer] Coulter's paramagnetic chemiluminescent immunoassay. Values obtained from different assay methods cannot be used interchangeably. Please note that up to 8% of patients who smoke may see values 5.1-10.0 ng/ml and 1% of patients who smoke may see CEA levels >10.0 ng/ml. Performed at [facility], [address], [place], [place] [address]   CMP (Cancer Center only)     Status: Abnormal   Collection Time: 08/09/24 11:05 AM  Result Value Ref Range   Sodium 138 135 - 145 mmol/L   Potassium 4.1 3.5 - 5.1 mmol/L    Comment: HEMOLYSIS AT THIS LEVEL MAY AFFECT RESULT   Chloride 101 98 - 111 mmol/L   CO2 25 22 - 32 mmol/L   Glucose, Bld 175 (H) 70 - 99 mg/dL    Comment: Glucose reference range applies only to samples taken after fasting for at least 8 hours.   BUN 7 6 - 20 mg/dL   Creatinine 9.12 9.55 - 1.00 mg/dL   Calcium  9.9 8.9 - 10.3 mg/dL   Total Protein 6.7 6.5 - 8.1 g/dL   Albumin  4.0 3.5 - 5.0 g/dL   AST 39 15 - 41 U/L    Comment: HEMOLYSIS AT THIS LEVEL MAY AFFECT RESULT   ALT 30 0 - 44 U/L   Alkaline Phosphatase 215 (H) 38 - 126 U/L   Total Bilirubin 0.2 0.0 - 1.2 mg/dL   GFR, Estimated >39 >39 mL/min    Comment: (NOTE) Calculated  using the CKD-EPI Creatinine Equation (2021)    Anion gap 13 5 - 15    Comment: Performed at [facility],  [address], [place], [place] [address]  CBC with Differential (Cancer Center Only)     Status: Abnormal   Collection Time: 08/09/24 11:05 AM  Result Value Ref Range   WBC Count 16.7 (H) 4.0 - 10.5 K/uL   RBC 3.92 3.87 - 5.11 MIL/uL   Hemoglobin 11.4 (L) 12.0 - 15.0 g/dL   HCT 65.2 (L) 63.9 - 53.9 %   MCV 88.5 80.0 - 100.0 fL   MCH 29.1 26.0 - 34.0 pg   MCHC 32.9 30.0 - 36.0 g/dL   RDW 82.0 (H) 88.4 - 84.4 %   Platelet Count 169 150 - 400 K/uL   nRBC 1.0 (H) 0.0 - 0.2 %   Neutrophils Relative % 55 %   Neutro Abs 9.1 (H) 1.7 - 7.7 K/uL   Lymphocytes Relative 20 %   Lymphs Abs 3.4 0.7 - 4.0 K/uL   Monocytes Relative 12 %   Monocytes Absolute 2.0 (H) 0.1 - 1.0 K/uL   Eosinophils Relative 1 %   Eosinophils Absolute 0.2 0.0 - 0.5 K/uL   Basophils Relative 0 %   Basophils Absolute 0.1 0.0 - 0.1 K/uL   Immature Granulocytes 12 %   Abs Immature Granulocytes 2.05 (H) 0.00 - 0.07 K/uL   Ovalocytes PRESENT     Comment: Performed at [facility], [address] [place], [place], [place] [address]  CEA (Access)     Status: None   Collection Time: 08/09/24 11:05 AM  Result Value Ref Range   CEA ([facility]) 3.67 0.00 - 5.00 ng/mL    Comment: (NOTE) This test was performed using [employer] Coulter's paramagnetic chemiluminescent immunoassay. Values obtained from different assay methods cannot be used interchangeably. Please note that up to 8% of patients who smoke may see values 5.1-10.0 ng/ml and 1% of patients who smoke may see CEA levels >10.0 ng/ml. Performed at [facility], [address], [place], [place] [address]   [facility] ([facility] ONLY     Status: None   Collection Time: 08/30/24  8:23 AM  Result Value Ref Range   CEA ([facility]) 2.57 0.00 - 5.00 ng/mL    Comment: (NOTE) This test was performed using Beckman Coulter's  paramagnetic chemiluminescent immunoassay. Values obtained from different assay methods cannot be used interchangeably. Please note that up to 8% of patients who smoke may see values 5.1-10.0 ng/ml and 1% of patients who smoke may see CEA levels >10.0 ng/ml. Performed at [facility], [address] [place], [place] [address]   CMP (Cancer Center only)     Status: Abnormal   Collection Time: 08/30/24  8:23 AM  Result Value Ref Range   Sodium 139 135 - 145 mmol/L   Potassium 3.6 3.5 - 5.1 mmol/L   Chloride 102 98 - 111 mmol/L   CO2 24 22 - 32 mmol/L   Glucose, Bld 110 (H) 70 - 99 mg/dL    Comment: Glucose reference range applies only to samples taken after fasting for at least 8 hours.   BUN 13 6 - 20 mg/dL   Creatinine 8.97 (H) 9.55 - 1.00 mg/dL   Calcium  9.9 8.9 - 10.3 mg/dL   Total Protein 6.7 6.5 - 8.1 g/dL   Albumin  4.3 3.5 - 5.0 g/dL   AST 33 15 - 41 U/L   ALT 32 0 - 44 U/L   Alkaline Phosphatase 169 (H) 38 - 126 U/L   Total Bilirubin 0.3 0.0 - 1.2 mg/dL   GFR, Estimated >39 >39 mL/min    Comment: (  NOTE) Calculated using the CKD-EPI Creatinine Equation (2021)    Anion gap 13 5 - 15    Comment: Performed at [facility], [address], [place], [place] [address]  CBC with Differential (Cancer Center Only)     Status: Abnormal   Collection Time: 08/30/24  8:23 AM  Result Value Ref Range   WBC Count 12.5 (H) 4.0 - 10.5 K/uL   RBC 3.81 (L) 3.87 - 5.11 MIL/uL   Hemoglobin 11.4 (L) 12.0 - 15.0 g/dL   HCT 65.9 (L) 63.9 - 53.9 %   MCV 89.2 80.0 - 100.0 fL   MCH 29.9 26.0 - 34.0 pg   MCHC 33.5 30.0 - 36.0 g/dL   RDW 80.0 (H) 88.4 - 84.4 %   Platelet Count 196 150 - 400 K/uL   nRBC 0.2 0.0 - 0.2 %   Neutrophils Relative % 62 %   Neutro Abs 7.7 1.7 - 7.7 K/uL   Lymphocytes Relative 24 %   Lymphs Abs 3.0 0.7 - 4.0 K/uL   Monocytes Relative 8 %   Monocytes Absolute 1.1 (H) 0.1 - 1.0 K/uL   Eosinophils Relative 1 %   Eosinophils  Absolute 0.1 0.0 - 0.5 K/uL   Basophils Relative 1 %   Basophils Absolute 0.1 0.0 - 0.1 K/uL   Immature Granulocytes 4 %   Abs Immature Granulocytes 0.53 (H) 0.00 - 0.07 K/uL    Comment: Performed at [facility], [address], [place], [place] [address]  CMP (Cancer Center only)     Status: Abnormal   Collection Time: 09/13/24  8:21 AM  Result Value Ref Range   Sodium 139 135 - 145 mmol/L   Potassium 3.5 3.5 - 5.1 mmol/L   Chloride 101 98 - 111 mmol/L   CO2 24 22 - 32 mmol/L   Glucose, Bld 144 (H) 70 - 99 mg/dL    Comment: Glucose reference range applies only to samples taken after fasting for at least 8 hours.   BUN 8 6 - 20 mg/dL   Creatinine 9.08 9.55 - 1.00 mg/dL   Calcium  9.8 8.9 - 10.3 mg/dL   Total Protein 6.9 6.5 - 8.1 g/dL   Albumin  4.3 3.5 - 5.0 g/dL   AST 32 15 - 41 U/L   ALT 23 0 - 44 U/L   Alkaline Phosphatase 192 (H) 38 - 126 U/L   Total Bilirubin 0.3 0.0 - 1.2 mg/dL   GFR, Estimated >39 >39 mL/min    Comment: (NOTE) Calculated using the CKD-EPI Creatinine Equation (2021)    Anion gap 14 5 - 15    Comment: Performed at [facility], [address], [place], [place] [address]  CBC with Differential (Cancer Center Only)     Status: Abnormal   Collection Time: 09/13/24  8:21 AM  Result Value Ref Range   WBC Count 9.5 4.0 - 10.5 K/uL   RBC 3.83 (L) 3.87 - 5.11 MIL/uL   Hemoglobin 11.7 (L) 12.0 - 15.0 g/dL   HCT 64.8 (L) 63.9 - 53.9 %   MCV 91.6 80.0 - 100.0 fL   MCH 30.5 26.0 - 34.0 pg   MCHC 33.3 30.0 - 36.0 g/dL   RDW 81.4 (H) 88.4 - 84.4 %   Platelet Count 209 150 - 400 K/uL   nRBC 0.7 (H) 0.0 - 0.2 %   Neutrophils Relative % 55 %   Neutro Abs 5.2 1.7 - 7.7 K/uL   Lymphocytes Relative 24 %   Lymphs  Abs 2.3 0.7 - 4.0 K/uL   Monocytes Relative 11 %   Monocytes Absolute 1.1 (H) 0.1 - 1.0 K/uL   Eosinophils Relative 3 %   Eosinophils Absolute 0.3 0.0 - 0.5 K/uL   Basophils Relative 1 %   Basophils Absolute 0.1 0.0  - 0.1 K/uL   Immature Granulocytes 6 %   Abs Immature Granulocytes 0.57 (H) 0.00 - 0.07 K/uL   Reactive, Benign Lymphocytes PRESENT    Tear Drop Cells PRESENT     Comment: Performed at [facility], [address], [place], [place] [address]  [facility] (Access[facility] ONLY     Status: None   Collection Time: 09/13/24  8:21 AM  Result Value Ref Range   CEA ([facility]) 2.57 0.00 - 5.00 ng/mL    Comment: (NOTE) This test was performed using Beckman Coulter's paramagnetic chemiluminescent immunoassay. Values obtained from different assay methods cannot be used interchangeably. Please note that up to 8% of patients who smoke may see values 5.1-10.0 ng/ml and 1% of patients who smoke may see CEA levels >10.0 ng/ml. Performed at [facility], [address], [place], [place] [address]   CMP (Cancer Center only)     Status: Abnormal   Collection Time: 09/24/24  8:25 AM  Result Value Ref Range   Sodium 139 135 - 145 mmol/L   Potassium 3.7 3.5 - 5.1 mmol/L   Chloride 101 98 - 111 mmol/L   CO2 26 22 - 32 mmol/L   Glucose, Bld 132 (H) 70 - 99 mg/dL    Comment: Glucose reference range applies only to samples taken after fasting for at least 8 hours.   BUN 6 6 - 20 mg/dL   Creatinine 9.19 9.55 - 1.00 mg/dL   Calcium  10.0 8.9 - 10.3 mg/dL   Total Protein 6.9 6.5 - 8.1 g/dL   Albumin  4.2 3.5 - 5.0 g/dL   AST 32 15 - 41 U/L   ALT 24 0 - 44 U/L   Alkaline Phosphatase 148 (H) 38 - 126 U/L   Total Bilirubin 0.3 0.0 - 1.2 mg/dL   GFR, Estimated >39 >39 mL/min    Comment: (NOTE) Calculated using the CKD-EPI Creatinine Equation (2021)    Anion gap 12 5 - 15    Comment: Performed at [facility], [address] [place], [place] [address]  CBC with Differential (Cancer Center Only)     Status: Abnormal   Collection Time: 09/24/24  8:25 AM  Result Value Ref Range   WBC Count 6.1 4.0 - 10.5 K/uL   RBC 3.54 (L) 3.87 - 5.11 MIL/uL   Hemoglobin 10.9  (L) 12.0 - 15.0 g/dL   HCT 66.8 (L) 63.9 - 53.9 %   MCV 93.5 80.0 - 100.0 fL   MCH 30.8 26.0 - 34.0 pg   MCHC 32.9 30.0 - 36.0 g/dL   RDW 82.8 (H) 88.4 - 84.4 %   Platelet Count 242 150 - 400 K/uL   nRBC 0.0 0.0 - 0.2 %   Neutrophils Relative % 44 %   Neutro Abs 2.7 1.7 - 7.7 K/uL   Lymphocytes Relative 34 %   Lymphs Abs 2.1 0.7 - 4.0 K/uL   Monocytes Relative 17 %   Monocytes Absolute 1.1 (H) 0.1 - 1.0 K/uL   Eosinophils Relative 3 %   Eosinophils Absolute 0.2 0.0 - 0.5 K/uL   Basophils Relative 1 %   Basophils Absolute 0.0 0.0 - 0.1 K/uL   Immature Granulocytes 1 %  Abs Immature Granulocytes 0.04 0.00 - 0.07 K/uL    Comment: Performed at [facility], [address], [place], [place] [address]  CEA (Access)     Status: None   Collection Time: 09/24/24  8:25 AM  Result Value Ref Range   [facility]A ([facility]) 2.12 0.00 - 5.00 ng/mL    Comment: (NOTE) This test was performed using Beckman Coulter's paramagnetic chemiluminescent immunoassay. Values obtained from different assay methods cannot be used interchangeably. Please note that up to 8% of patients who smoke may see values 5.1-10.0 ng/ml and 1% of patients who smoke may see CEA levels >10.0 ng/ml. Performed at [facility], [address], [place], [place] [address]      Psychiatric Specialty Exam: Physical Exam  Review of Systems  Constitutional:  Positive for fatigue.    Weight 256 lb (116.1 kg), last menstrual period 09/17/2017.There is no height or weight on file to calculate BMI.  General Appearance: Casual  Eye Contact:  Fair  Speech:  Slow  Volume:  Decreased  Mood:  tired  Affect:  Appropriate  Thought Process:  Goal Directed  Orientation:  Full (Time, Place, and Person)  Thought Content:  Logical  Suicidal Thoughts:  No  Homicidal Thoughts:  No  Memory:  Immediate;   Good Recent;   Good Remote;   Fair  Judgement:  Intact  Insight:  Present  Psychomotor Activity:   Decreased  Concentration:  Concentration: Fair and Attention Span: Fair  Recall:  Fair  Fund of Knowledge:  Good  Language:  Good  Akathisia:  No  Handed:  Right  AIMS (if indicated):     Assets:  Communication Skills Desire for Improvement Housing Resilience Social Support  ADL's:  Intact  Cognition:  WNL  Sleep:  ok with Ambien         09/26/2024   12:37 PM 09/24/2024    8:00 AM 08/09/2024   11:03 AM 07/26/2024   11:00 AM 07/12/2024    9:11 AM  Depression screen PHQ 2/9  Decreased Interest 0 0 0 1 1  Down, Depressed, Hopeless 0 0 0 1 1  PHQ - 2 Score 0 0 0 2 2  Altered sleeping 0 0 0 1 0  Tired, decreased energy 0 0 0 1 1  Change in appetite 0 0 1 0 1  Feeling bad or failure about yourself  0 0 0 1 1  Trouble concentrating 0 0 0 0 0  Moving slowly or fidgety/restless 0 0 0 0 0  Suicidal thoughts 0 0 0 0 0  PHQ-9 Score 0 0 1 5 5     Assessment/Plan: MDD (major depressive disorder), recurrent episode, mild - Plan: sertraline  (ZOLOFT ) 100 MG tablet, buPROPion  (WELLBUTRIN  XL) 300 MG 24 hr tablet  Primary insomnia - Plan: zolpidem  (AMBIEN  CR) 12.5 MG CR tablet  GAD (generalized anxiety disorder) - Plan: sertraline  (ZOLOFT ) 100 MG tablet  Patient is a 53 year old female recently diagnosed with GI cancer and getting chemotherapy.  Discussed current medication.  Patient doing better and very hopeful as have it done with the chemotherapy and recent MRI shows better results.  Encouraged to continue therapy at cancer center.  Patient does not want to change the medication.  Continue Zoloft  100 mg daily, Ambien  CR 12.5 mg at bedtime and Wellbutrin  XL 300 mg daily.  Recommended to call back if she is any question or any concern.  Follow-up in 3 months.   Follow Up Instructions:     I discussed the  assessment and treatment plan with the patient. The patient was provided an opportunity to ask questions and all were answered. The patient agreed with the plan and demonstrated an  understanding of the instructions.   The patient was advised to call back or seek an in-person evaluation if the symptoms worsen or if the condition fails to improve as anticipated.    Collaboration of Care: Other provider involved in patient's care AEB notes are available in epic to review  Patient/Guardian was advised Release of Information must be obtained prior to any record release in order to collaborate their care with an outside provider. Patient/Guardian was advised if they have not already done so to contact the registration department to sign all necessary forms in order for us  to release information regarding their care.   Consent: Patient/Guardian gives verbal consent for treatment and assignment of benefits for services provided during this visit. Patient/Guardian expressed understanding and agreed to proceed.     Total encounter time 18 minutes which includes face-to-face time, chart reviewed, care coordination, order entry and documentation during this encounter.   Note: This document was prepared by [employer] voice dictation technology and any errors that results from this process are unintentional.    Leni ONEIDA Client, MD 10/02/2024

## 2024-10-03 ENCOUNTER — Other Ambulatory Visit

## 2024-10-03 ENCOUNTER — Ambulatory Visit

## 2024-10-03 ENCOUNTER — Ambulatory Visit: Admitting: Nurse Practitioner

## 2024-10-04 ENCOUNTER — Other Ambulatory Visit: Payer: Self-pay

## 2024-10-05 ENCOUNTER — Encounter

## 2024-10-06 ENCOUNTER — Other Ambulatory Visit: Payer: Self-pay | Admitting: Oncology

## 2024-10-06 DIAGNOSIS — C182 Malignant neoplasm of ascending colon: Secondary | ICD-10-CM

## 2024-10-08 ENCOUNTER — Institutional Professional Consult (permissible substitution) (INDEPENDENT_AMBULATORY_CARE_PROVIDER_SITE_OTHER)

## 2024-10-09 ENCOUNTER — Inpatient Hospital Stay

## 2024-10-09 ENCOUNTER — Other Ambulatory Visit: Payer: Self-pay

## 2024-10-09 ENCOUNTER — Encounter: Payer: Self-pay | Admitting: Nurse Practitioner

## 2024-10-09 ENCOUNTER — Other Ambulatory Visit (HOSPITAL_BASED_OUTPATIENT_CLINIC_OR_DEPARTMENT_OTHER): Payer: Self-pay

## 2024-10-09 ENCOUNTER — Inpatient Hospital Stay (HOSPITAL_BASED_OUTPATIENT_CLINIC_OR_DEPARTMENT_OTHER): Admitting: Nurse Practitioner

## 2024-10-09 VITALS — BP 163/97 | HR 85 | Temp 98.1°F | Resp 17

## 2024-10-09 VITALS — BP 142/89 | HR 88 | Temp 97.8°F | Resp 18 | Ht 66.0 in | Wt 253.1 lb

## 2024-10-09 DIAGNOSIS — C182 Malignant neoplasm of ascending colon: Secondary | ICD-10-CM

## 2024-10-09 DIAGNOSIS — Z5111 Encounter for antineoplastic chemotherapy: Secondary | ICD-10-CM | POA: Diagnosis not present

## 2024-10-09 LAB — CBC WITH DIFFERENTIAL (CANCER CENTER ONLY)
Abs Immature Granulocytes: 0.51 K/uL — ABNORMAL HIGH (ref 0.00–0.07)
Basophils Absolute: 0.1 K/uL (ref 0.0–0.1)
Basophils Relative: 1 %
Eosinophils Absolute: 0.3 K/uL (ref 0.0–0.5)
Eosinophils Relative: 2 %
HCT: 35.4 % — ABNORMAL LOW (ref 36.0–46.0)
Hemoglobin: 12.1 g/dL (ref 12.0–15.0)
Immature Granulocytes: 4 %
Lymphocytes Relative: 19 %
Lymphs Abs: 2.3 K/uL (ref 0.7–4.0)
MCH: 32 pg (ref 26.0–34.0)
MCHC: 34.2 g/dL (ref 30.0–36.0)
MCV: 93.7 fL (ref 80.0–100.0)
Monocytes Absolute: 1 K/uL (ref 0.1–1.0)
Monocytes Relative: 8 %
Neutro Abs: 8.1 K/uL — ABNORMAL HIGH (ref 1.7–7.7)
Neutrophils Relative %: 66 %
Platelet Count: 184 K/uL (ref 150–400)
RBC: 3.78 MIL/uL — ABNORMAL LOW (ref 3.87–5.11)
RDW: 16.2 % — ABNORMAL HIGH (ref 11.5–15.5)
WBC Count: 12.1 K/uL — ABNORMAL HIGH (ref 4.0–10.5)
nRBC: 0.2 % (ref 0.0–0.2)

## 2024-10-09 LAB — CMP (CANCER CENTER ONLY)
ALT: 20 U/L (ref 0–44)
AST: 31 U/L (ref 15–41)
Albumin: 4.6 g/dL (ref 3.5–5.0)
Alkaline Phosphatase: 199 U/L — ABNORMAL HIGH (ref 38–126)
Anion gap: 13 (ref 5–15)
BUN: 7 mg/dL (ref 6–20)
CO2: 26 mmol/L (ref 22–32)
Calcium: 9.8 mg/dL (ref 8.9–10.3)
Chloride: 99 mmol/L (ref 98–111)
Creatinine: 0.83 mg/dL (ref 0.44–1.00)
GFR, Estimated: >60 mL/min (ref >60)
Glucose, Bld: 146 mg/dL — ABNORMAL HIGH (ref 70–99)
Potassium: 3.5 mmol/L (ref 3.5–5.1)
Sodium: 138 mmol/L (ref 135–145)
Total Bilirubin: 0.3 mg/dL (ref 0.0–1.2)
Total Protein: 7.4 g/dL (ref 6.5–8.1)

## 2024-10-09 LAB — CEA (ACCESS): CEA (CHCC): 2.3 ng/mL (ref 0.00–5.00)

## 2024-10-09 MED ORDER — SODIUM CHLORIDE 0.9 % IV SOLN
1800.0000 mg/m2 | INTRAVENOUS | Status: DC
  Administered 2024-10-09: 4100 mg via INTRAVENOUS
  Filled 2024-10-09: qty 82

## 2024-10-09 MED ORDER — DEXAMETHASONE 4 MG PO TABS
4.0000 mg | ORAL_TABLET | Freq: Two times a day (BID) | ORAL | 4 refills | Status: DC
  Filled 2024-10-09: qty 4, 2d supply, fill #0
  Filled 2024-10-24: qty 4, 2d supply, fill #1

## 2024-10-09 MED ORDER — DEXTROSE 5 % IV SOLN: INTRAVENOUS | Status: DC

## 2024-10-09 MED ORDER — DEXAMETHASONE SOD PHOSPHATE PF 10 MG/ML IJ SOLN
10.0000 mg | Freq: Once | INTRAMUSCULAR | Status: AC
  Administered 2024-10-09: 10 mg via INTRAVENOUS

## 2024-10-09 MED ORDER — PALONOSETRON HCL INJECTION 0.25 MG/5ML
0.2500 mg | Freq: Once | INTRAVENOUS | Status: AC
  Administered 2024-10-09: 0.25 mg via INTRAVENOUS
  Filled 2024-10-09: qty 5

## 2024-10-09 MED ORDER — FLUOROURACIL CHEMO INJECTION 2.5 GM/50ML
300.0000 mg/m2 | Freq: Once | INTRAVENOUS | Status: AC
  Administered 2024-10-09: 700 mg via INTRAVENOUS
  Filled 2024-10-09: qty 14

## 2024-10-09 MED ORDER — OXALIPLATIN CHEMO INJECTION 100 MG/20ML
65.0000 mg/m2 | Freq: Once | INTRAVENOUS | Status: AC
  Administered 2024-10-09: 150 mg via INTRAVENOUS
  Filled 2024-10-09: qty 20

## 2024-10-09 MED ORDER — LEUCOVORIN CALCIUM INJECTION 350 MG
300.0000 mg/m2 | Freq: Once | INTRAVENOUS | Status: AC
  Administered 2024-10-09: 684 mg via INTRAVENOUS
  Filled 2024-10-09: qty 34.2

## 2024-10-09 NOTE — Progress Notes (Signed)
 [facility] OFFICE PROGRESS NOTE   Diagnosis: Colon cancer  INTERVAL HISTORY:   Erin Bradford returns as scheduled.  She completed cycle 7 FOLFOX 09/24/2024.  5-fluorouracil  was dose reduced due to diarrhea and tearing.  She noted last area and less tearing following the most recent chemotherapy.  She had nausea for the first week, better the second week.  She takes dexamethasone  prophylaxis and then Compazine  and/or Zofran  as needed.  She has been able to eat and drink without difficulty.  No mouth sores.  She has mild persistent cold sensitivity which has actually improved.  She has mild tingling in the fingertips not always related to cold exposure.  She has chronic back pain.  She has recently noted intermittent throbbing pain a little higher than where she typically experiences pain.  The pain does not occur on a daily basis.  Objective:  Vital signs in last 24 hours:  Blood pressure (!) 142/89, pulse 88, temperature 97.8 F (36.6 C), temperature source Temporal, resp. rate 18, height 5' 6 (1.676 m), weight 253 lb 1.6 oz (114.8 kg), last menstrual period 09/17/2017, SpO2 98%.    HEENT: No thrush or ulcers. Resp: Lungs clear bilaterally. Cardio: Regular rate and rhythm. GI: No hepatosplenomegaly. Vascular: No leg edema. Neuro: Vibratory sense intact over the fingertips per tuning fork exam. Skin: Palms without erythema. Port-A-Cath without erythema.  Lab Results:  Lab Results  Component Value Date   WBC 12.1 (H) 10/09/2024   HGB 12.1 10/09/2024   HCT 35.4 (L) 10/09/2024   MCV 93.7 10/09/2024   PLT 184 10/09/2024   NEUTROABS 8.1 (H) 10/09/2024    Imaging:  No results found.  Medications: I have reviewed the patient's current medications.  Assessment/Plan: Colon cancer, cecum, stage IIIb (pT4a, PN1a), Ileocecectomy 04/12/2024-moderately differentiated adenocarcinoma of the cecum, no macroscopic tumor perforation, carcinoma invades into the serosal  surface, no lymphovascular perineural invasion, negative resection margins, 1/3 nodes, 1 tumor deposit, normal mismatch repair protein expression, microsatellite stable CT abdomen/pelvis 04/11/2024: Dilated appendix with extensive periappendiceal inflammatory changes and a 5 cm fluid collection, no enlarged abdominal pelvic lymph nodes, unchanged 9 mm right adrenal nodule from 2018 CT chest 04/21/2024: 1.4 cm anterior mediastinal nodule or node, bilateral tiny nonspecific pulmonary nodules PET scan 06/08/2024-developing metastatic lesions right hemiabdominal mesenteric lymph nodes as well as at least 2 liver metastases.  Anterior superior mediastinal soft tissue nodule is similar to the previous chest CT, no abnormal uptake. Cycle 1 FOLFOX 06/12/2024 MRI liver 06/19/2024-numerous small rim-enhancing hypovascular masses seen throughout the liver.  Right lower quadrant mesenteric lymphadenopathy measuring 1.8 cm. Cycle 2 FOLFOX 06/25/2024 Cycle 3 FOLFOX 07/12/2024 Cycle 4 FOLFOX 07/26/2024, oxaliplatin  dose reduced due to persistent cold sensitivity and mild thrombocytopenia Cycle 5 FOLFOX 08/09/2024 Cycle 6 FOLFOX 08/30/2024 Treatment held 09/13/2024 due to neuropathy, diarrhea following cycle 6, tearing MRI 09/21/2024-significant interval decrease in size and conspicuity of the numerous bilobar hepatic lesions.  No new lesions identified.  Decreased size of right lower quadrant mesenteric lymph nodes. Cycle 7 FOLFOX 09/24/2024, 5-fluorouracil  dose reduced due to diarrhea and tearing Cycle 8 FOLFOX 10/09/2024 Depression Urinary frequency Hypertension Tremors Tachycardia Asthma Family history of breast and colon cancer Colonoscopy-needs colonoscopy approximately 6 months from diagnosis  Disposition: Erin Bradford appears stable.  She has completed 7 cycles of FOLFOX.  5-FU was dose reduced with cycle 7 due to diarrhea and tearing.  She notes improvement in both.  She has mild persistent cold sensitivity which  has improved over the  past 2 weeks.  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  Vibratory sense is intact per tuning fork exam.  Continue to monitor.  Plan to proceed with cycle 8 FOLFOX today as scheduled.  CBC and chemistry panel reviewed.  Labs adequate for treatment.  CEA remains stable in normal range.  She has chronic low back pain.  At today's visit she reports intermittent pain slightly higher than where she typically notes pain.  She will contact the office if this worsens.  She will return for follow-up and the next cycle of FOLFOX in 2 weeks.  We are available to see her sooner if needed.      Olam Ned ANP/GNP-BC   10/09/2024  10:57 AM

## 2024-10-09 NOTE — Patient Instructions (Signed)
 [facility] - A DEPT OF [facility]  Discharge Instructions: Thank you for choosing [facility] to provide your oncology and hematology care.   If you have a lab appointment with the Cancer Center, please go directly to the Cancer Center and check in at the registration area.   Wear comfortable clothing and clothing appropriate for easy access to any Portacath or PICC line.   We strive to give you quality time with your provider. You may need to reschedule your appointment if you arrive late (15 or more minutes).  Arriving late affects you and other patients whose appointments are after yours.  Also, if you miss three or more appointments without notifying the office, you may be dismissed from the clinic at the provider's discretion.      For prescription refill requests, have your pharmacy contact our office and allow 72 hours for refills to be completed.    Today you received the following chemotherapy and/or immunotherapy agents: oxaliplatin , leucovorin , fluorouracil        To help prevent nausea and vomiting after your treatment, we encourage you to take your nausea medication as directed.  BELOW ARE SYMPTOMS THAT SHOULD BE REPORTED IMMEDIATELY: *FEVER GREATER THAN 100.4 F (38 C) OR HIGHER *CHILLS OR SWEATING *NAUSEA AND VOMITING THAT IS NOT CONTROLLED WITH YOUR NAUSEA MEDICATION *UNUSUAL SHORTNESS OF BREATH *UNUSUAL BRUISING OR BLEEDING *URINARY PROBLEMS (pain or burning when urinating, or frequent urination) *BOWEL PROBLEMS (unusual diarrhea, constipation, pain near the anus) TENDERNESS IN MOUTH AND THROAT WITH OR WITHOUT PRESENCE OF ULCERS (sore throat, sores in mouth, or a toothache) UNUSUAL RASH, SWELLING OR PAIN  UNUSUAL VAGINAL DISCHARGE OR ITCHING   Items with * indicate a potential emergency and should be followed up as soon as possible or go to the Emergency Department if any problems should occur.  Please show the CHEMOTHERAPY  ALERT CARD or IMMUNOTHERAPY ALERT CARD at check-in to the Emergency Department and triage nurse.  Should you have questions after your visit or need to cancel or reschedule your appointment, please contact [facility] - A DEPT OF [facility]  Dept: [phone]  and follow the prompts.  Office hours are 8:00 a.m. to 4:30 p.m. Monday - Friday. Please note that voicemails left after 4:00 p.m. may not be returned until the following business day.  We are closed weekends and major holidays. You have access to a nurse at all times for urgent questions. Please call the main number to the clinic Dept: [phone] and follow the prompts.   For any non-urgent questions, you may also contact your provider using MyChart. We now offer e-Visits for anyone 22 and older to request care online for non-urgent symptoms. For details visit [email or web address].   Also download the MyChart app! Go to the app store, search MyChart, open the app, select [facility], and log in with your MyChart username and password.

## 2024-10-09 NOTE — Progress Notes (Signed)
 Patient seen by Olam Ned NP today  Vitals are within treatment parameters:Yes   Labs are within treatment parameters: Yes   Treatment plan has been signed: Yes   Per physician team, Patient is ready for treatment and there are NO modifications to the treatment plan.

## 2024-10-10 ENCOUNTER — Ambulatory Visit: Admitting: Oncology

## 2024-10-10 ENCOUNTER — Ambulatory Visit

## 2024-10-10 ENCOUNTER — Other Ambulatory Visit

## 2024-10-11 ENCOUNTER — Inpatient Hospital Stay

## 2024-10-11 VITALS — BP 108/75 | HR 71 | Temp 98.1°F | Resp 18

## 2024-10-11 DIAGNOSIS — Z5111 Encounter for antineoplastic chemotherapy: Secondary | ICD-10-CM | POA: Diagnosis not present

## 2024-10-11 DIAGNOSIS — C182 Malignant neoplasm of ascending colon: Secondary | ICD-10-CM

## 2024-10-11 MED ORDER — PEGFILGRASTIM-CBQV 6 MG/0.6ML ~~LOC~~ SOSY
6.0000 mg | PREFILLED_SYRINGE | Freq: Once | SUBCUTANEOUS | Status: AC
  Administered 2024-10-11: 6 mg via SUBCUTANEOUS

## 2024-10-12 ENCOUNTER — Encounter

## 2024-10-16 ENCOUNTER — Other Ambulatory Visit: Payer: Self-pay | Admitting: Oncology

## 2024-10-16 ENCOUNTER — Other Ambulatory Visit: Payer: Self-pay

## 2024-10-16 DIAGNOSIS — C182 Malignant neoplasm of ascending colon: Secondary | ICD-10-CM

## 2024-10-19 ENCOUNTER — Telehealth: Payer: Self-pay | Admitting: *Deleted

## 2024-10-19 NOTE — Telephone Encounter (Signed)
 Patient called to report 3 day history of left mid abdominal discomfort (mild) that is constant. Feels abdomen may be slightly bloated, but does not feel gassy or nauseated. No fever and bowels moving normally. She has not tried any intervention as of yet. Informed her this sounds as if it can wait till [facility] appointment and suggested she try OTC Tylenol .  Instructed her to go to ER if pain becomes severe, N/V or fever.

## 2024-10-22 ENCOUNTER — Encounter: Payer: Self-pay | Admitting: Oncology

## 2024-10-22 ENCOUNTER — Inpatient Hospital Stay

## 2024-10-22 ENCOUNTER — Inpatient Hospital Stay (HOSPITAL_BASED_OUTPATIENT_CLINIC_OR_DEPARTMENT_OTHER): Admitting: Oncology

## 2024-10-22 ENCOUNTER — Other Ambulatory Visit: Payer: Self-pay | Admitting: *Deleted

## 2024-10-22 ENCOUNTER — Encounter: Payer: Self-pay | Admitting: *Deleted

## 2024-10-22 VITALS — BP 144/76 | HR 73 | Temp 97.9°F | Resp 16

## 2024-10-22 VITALS — BP 133/92 | HR 73 | Temp 97.8°F | Resp 18 | Ht 66.0 in | Wt 256.0 lb

## 2024-10-22 DIAGNOSIS — C182 Malignant neoplasm of ascending colon: Secondary | ICD-10-CM

## 2024-10-22 DIAGNOSIS — Z5111 Encounter for antineoplastic chemotherapy: Secondary | ICD-10-CM | POA: Diagnosis not present

## 2024-10-22 DIAGNOSIS — R7303 Prediabetes: Secondary | ICD-10-CM

## 2024-10-22 DIAGNOSIS — Z23 Encounter for immunization: Secondary | ICD-10-CM

## 2024-10-22 LAB — CMP (CANCER CENTER ONLY)
ALT: 21 U/L (ref 0–44)
AST: 34 U/L (ref 15–41)
Albumin: 4.2 g/dL (ref 3.5–5.0)
Alkaline Phosphatase: 231 U/L — ABNORMAL HIGH (ref 38–126)
Anion gap: 11 (ref 5–15)
BUN: 7 mg/dL (ref 6–20)
CO2: 26 mmol/L (ref 22–32)
Calcium: 9.4 mg/dL (ref 8.9–10.3)
Chloride: 99 mmol/L (ref 98–111)
Creatinine: 0.91 mg/dL (ref 0.44–1.00)
GFR, Estimated: >60 mL/min (ref >60)
Glucose, Bld: 305 mg/dL — ABNORMAL HIGH (ref 70–99)
Potassium: 3.9 mmol/L (ref 3.5–5.1)
Sodium: 136 mmol/L (ref 135–145)
Total Bilirubin: 0.2 mg/dL (ref 0.0–1.2)
Total Protein: 6.7 g/dL (ref 6.5–8.1)

## 2024-10-22 LAB — CBC WITH DIFFERENTIAL (CANCER CENTER ONLY)
Abs Immature Granulocytes: 0.54 K/uL — ABNORMAL HIGH (ref 0.00–0.07)
Basophils Absolute: 0.1 K/uL (ref 0.0–0.1)
Basophils Relative: 1 %
Eosinophils Absolute: 0.3 K/uL (ref 0.0–0.5)
Eosinophils Relative: 2 %
HCT: 33.8 % — ABNORMAL LOW (ref 36.0–46.0)
Hemoglobin: 11.7 g/dL — ABNORMAL LOW (ref 12.0–15.0)
Immature Granulocytes: 4 %
Lymphocytes Relative: 21 %
Lymphs Abs: 3 K/uL (ref 0.7–4.0)
MCH: 33.2 pg (ref 26.0–34.0)
MCHC: 34.6 g/dL (ref 30.0–36.0)
MCV: 96 fL (ref 80.0–100.0)
Monocytes Absolute: 1.5 K/uL — ABNORMAL HIGH (ref 0.1–1.0)
Monocytes Relative: 10 %
Neutro Abs: 9.1 K/uL — ABNORMAL HIGH (ref 1.7–7.7)
Neutrophils Relative %: 62 %
Platelet Count: 265 K/uL (ref 150–400)
RBC: 3.52 MIL/uL — ABNORMAL LOW (ref 3.87–5.11)
RDW: 14.9 % (ref 11.5–15.5)
WBC Count: 14.4 K/uL — ABNORMAL HIGH (ref 4.0–10.5)
nRBC: 0.2 % (ref 0.0–0.2)

## 2024-10-22 LAB — CEA (ACCESS): CEA (CHCC): 2.4 ng/mL (ref 0.00–5.00)

## 2024-10-22 MED ORDER — LEUCOVORIN CALCIUM INJECTION 350 MG
300.0000 mg/m2 | Freq: Once | INTRAVENOUS | Status: AC
  Administered 2024-10-22: 684 mg via INTRAVENOUS
  Filled 2024-10-22: qty 34.2

## 2024-10-22 MED ORDER — OXALIPLATIN CHEMO INJECTION 100 MG/20ML
65.0000 mg/m2 | Freq: Once | INTRAVENOUS | Status: AC
  Administered 2024-10-22: 150 mg via INTRAVENOUS
  Filled 2024-10-22: qty 20

## 2024-10-22 MED ORDER — DEXAMETHASONE SOD PHOSPHATE PF 10 MG/ML IJ SOLN
10.0000 mg | Freq: Once | INTRAMUSCULAR | Status: AC
  Administered 2024-10-22: 10 mg via INTRAVENOUS

## 2024-10-22 MED ORDER — FLUOROURACIL CHEMO INJECTION 2.5 GM/50ML
300.0000 mg/m2 | Freq: Once | INTRAVENOUS | Status: AC
  Administered 2024-10-22: 700 mg via INTRAVENOUS
  Filled 2024-10-22: qty 14

## 2024-10-22 MED ORDER — DEXTROSE 5 % IV SOLN: INTRAVENOUS | Status: DC

## 2024-10-22 MED ORDER — SODIUM CHLORIDE 0.9 % IV SOLN
1800.0000 mg/m2 | INTRAVENOUS | Status: DC
  Administered 2024-10-22: 4100 mg via INTRAVENOUS
  Filled 2024-10-22: qty 82

## 2024-10-22 MED ORDER — INFLUENZA VIRUS VACC SPLIT PF (FLUZONE) 0.5 ML IM SUSY
0.5000 mL | PREFILLED_SYRINGE | Freq: Once | INTRAMUSCULAR | Status: AC
  Administered 2024-10-22: 0.5 mL via INTRAMUSCULAR
  Filled 2024-10-22: qty 0.5

## 2024-10-22 MED ORDER — PALONOSETRON HCL INJECTION 0.25 MG/5ML
0.2500 mg | Freq: Once | INTRAVENOUS | Status: AC
  Administered 2024-10-22: 0.25 mg via INTRAVENOUS
  Filled 2024-10-22: qty 5

## 2024-10-22 NOTE — Progress Notes (Signed)
 [facility] OFFICE PROGRESS NOTE   Diagnosis: Colon cancer  INTERVAL HISTORY:   Ms. completed another cycle of FOLFOX 10/09/2024.  She has nausea following chemotherapy, but no emesis.  She continues to have discomfort at the left neck and shoulder.  No mouth sores or diarrhea.  She has cold sensitivity following chemotherapy.  No peripheral numbness.  Good appetite.  She has bone pain for 3 days after receiving G-CSF.  Objective:  Vital signs in last 24 hours:  Blood pressure (!) 133/92, pulse 73, temperature 97.8 F (36.6 C), temperature source Temporal, resp. rate 18, height 5' 6 (1.676 m), weight 256 lb (116.1 kg), last menstrual period 09/17/2017, SpO2 100%.    HEENT: No thrush or ulcers slight soft fullness in the left lower neck/supraclavicular fossa Resp: Lungs clear bilaterally Cardio: Regular rate and rhythm GI: No hepatosplenomegaly, nontender, no mass Vascular: No leg edema Neuro: Mild loss of vibratory sense at the fingertips bilaterally   Portacath/PICC-without erythema  Lab Results:  Lab Results  Component Value Date   WBC 14.4 (H) 10/22/2024   HGB 11.7 (L) 10/22/2024   HCT 33.8 (L) 10/22/2024   MCV 96.0 10/22/2024   PLT 265 10/22/2024   NEUTROABS 9.1 (H) 10/22/2024    CMP  Lab Results  Component Value Date   NA 138 10/09/2024   K 3.5 10/09/2024   CL 99 10/09/2024   CO2 26 10/09/2024   GLUCOSE 146 (H) 10/09/2024   BUN 7 10/09/2024   CREATININE 0.83 10/09/2024   CALCIUM  9.8 10/09/2024   PROT 7.4 10/09/2024   ALBUMIN  4.6 10/09/2024   AST 31 10/09/2024   ALT 20 10/09/2024   ALKPHOS 199 (H) 10/09/2024   BILITOT 0.3 10/09/2024   GFRNONAA >60 10/09/2024   GFRAA >60 07/19/2018    Lab Results  Component Value Date   CEA 2.30 10/09/2024   Medications: I have reviewed the patient's current medications.   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, no macroscopic tumor perforation, carcinoma invades into the serosal surface, no lymphovascular perineural invasion, negative resection margins, 1/3 nodes, 1 tumor deposit, normal mismatch repair protein expression, microsatellite stable CT abdomen/pelvis 04/11/2024: Dilated appendix with extensive periappendiceal inflammatory changes and a 5 cm fluid collection, no enlarged abdominal pelvic lymph nodes, unchanged 9 mm right adrenal nodule from 2018 CT chest 04/21/2024: 1.4 cm anterior mediastinal nodule or node, bilateral tiny nonspecific pulmonary nodules PET scan 06/08/2024-developing metastatic lesions right hemiabdominal mesenteric lymph nodes as well as at least 2 liver metastases.  Anterior superior mediastinal soft tissue nodule is similar to the previous chest CT, no abnormal uptake. Cycle 1 FOLFOX 06/12/2024 MRI liver 06/19/2024-numerous small rim-enhancing hypovascular masses seen throughout the liver.  Right lower quadrant mesenteric lymphadenopathy measuring 1.8 cm. Cycle 2 FOLFOX 06/25/2024 Cycle 3 FOLFOX 07/12/2024 Cycle 4 FOLFOX 07/26/2024, oxaliplatin  dose reduced due to persistent cold sensitivity and mild thrombocytopenia Cycle 5 FOLFOX 08/09/2024 Cycle 6 FOLFOX 08/30/2024 Treatment held 09/13/2024 due to neuropathy, diarrhea following cycle 6, tearing MRI 09/21/2024-significant interval decrease in size and conspicuity of the numerous bilobar hepatic lesions.  No new lesions identified.  Decreased size of right lower quadrant mesenteric lymph nodes. Cycle 7 FOLFOX 09/24/2024, 5-fluorouracil  dose reduced due to diarrhea and tearing Cycle 8 FOLFOX 10/09/2024 Cycle 9 FOLFOX 10/22/2024 Depression Urinary frequency Hypertension Tremors Tachycardia Asthma Family history of breast and colon cancer Colonoscopy-needs colonoscopy approximately 6 months from diagnosis    Disposition: Ms. Fackrell appears  stable.  She is tolerating the chemotherapy well.  She will  complete another cycle of FOLFOX today.  She has mild symptoms of oxaliplatin  neuropathy.  She will receive an influenza vaccine today.  Ms. Roscher will return for an office visit and chemotherapy in 2 weeks.  Arley Hof, MD  10/22/2024  8:48 AM

## 2024-10-22 NOTE — Patient Instructions (Signed)
 [facility] - A DEPT OF [facility]  Discharge Instructions: Thank you for choosing [facility] to provide your oncology and hematology care.   If you have a lab appointment with the Cancer Center, please go directly to the Cancer Center and check in at the registration area.   Wear comfortable clothing and clothing appropriate for easy access to any Portacath or PICC line.   We strive to give you quality time with your provider. You may need to reschedule your appointment if you arrive late (15 or more minutes).  Arriving late affects you and other patients whose appointments are after yours.  Also, if you miss three or more appointments without notifying the office, you may be dismissed from the clinic at the provider's discretion.      For prescription refill requests, have your pharmacy contact our office and allow 72 hours for refills to be completed.    Today you received the following chemotherapy and/or immunotherapy agents: oxaliplatin , leucovorin , fluorouracil        To help prevent nausea and vomiting after your treatment, we encourage you to take your nausea medication as directed.  BELOW ARE SYMPTOMS THAT SHOULD BE REPORTED IMMEDIATELY: *FEVER GREATER THAN 100.4 F (38 C) OR HIGHER *CHILLS OR SWEATING *NAUSEA AND VOMITING THAT IS NOT CONTROLLED WITH YOUR NAUSEA MEDICATION *UNUSUAL SHORTNESS OF BREATH *UNUSUAL BRUISING OR BLEEDING *URINARY PROBLEMS (pain or burning when urinating, or frequent urination) *BOWEL PROBLEMS (unusual diarrhea, constipation, pain near the anus) TENDERNESS IN MOUTH AND THROAT WITH OR WITHOUT PRESENCE OF ULCERS (sore throat, sores in mouth, or a toothache) UNUSUAL RASH, SWELLING OR PAIN  UNUSUAL VAGINAL DISCHARGE OR ITCHING   Items with * indicate a potential emergency and should be followed up as soon as possible or go to the Emergency Department if any problems should occur.  Please show the CHEMOTHERAPY  ALERT CARD or IMMUNOTHERAPY ALERT CARD at check-in to the Emergency Department and triage nurse.  Should you have questions after your visit or need to cancel or reschedule your appointment, please contact [facility] - A DEPT OF [facility]  Dept: [phone]  and follow the prompts.  Office hours are 8:00 a.m. to 4:30 p.m. Monday - Friday. Please note that voicemails left after 4:00 p.m. may not be returned until the following business day.  We are closed weekends and major holidays. You have access to a nurse at all times for urgent questions. Please call the main number to the clinic Dept: [phone] and follow the prompts.   For any non-urgent questions, you may also contact your provider using MyChart. We now offer e-Visits for anyone 22 and older to request care online for non-urgent symptoms. For details visit [email or web address].   Also download the MyChart app! Go to the app store, search MyChart, open the app, select [facility], and log in with your MyChart username and password.

## 2024-10-22 NOTE — Patient Instructions (Signed)

## 2024-10-22 NOTE — Progress Notes (Signed)
 Talked with patient re: elevated glucose reading an to avoid concentrated sweets and provided reading sheet. Will check CBG on day of pump d/c. Routed labs to her PCP as well

## 2024-10-22 NOTE — Progress Notes (Signed)
 Patient seen by Dr. Arley Hof today  Vitals are within treatment parameters:Yes  OK to proceed w/BP 133/92  Labs are within treatment parameters: Yes  OK to proceed w/glucose 305  Treatment plan has been signed: Yes   Per physician team, Patient is ready for treatment and there are NO modifications to the treatment plan.

## 2024-10-23 ENCOUNTER — Telehealth: Payer: Self-pay | Admitting: *Deleted

## 2024-10-23 NOTE — Telephone Encounter (Signed)
 Ms. Safer called to ask Dr. Cloretta if she can drive now? Informed her that Dr. Cloretta prefers to defer that decision to her pain management provider.

## 2024-10-24 ENCOUNTER — Inpatient Hospital Stay

## 2024-10-24 ENCOUNTER — Other Ambulatory Visit

## 2024-10-24 ENCOUNTER — Ambulatory Visit

## 2024-10-24 ENCOUNTER — Other Ambulatory Visit (HOSPITAL_BASED_OUTPATIENT_CLINIC_OR_DEPARTMENT_OTHER): Payer: Self-pay

## 2024-10-24 ENCOUNTER — Ambulatory Visit: Admitting: Nurse Practitioner

## 2024-10-24 VITALS — BP 112/67 | HR 69 | Temp 98.1°F | Resp 18

## 2024-10-24 DIAGNOSIS — C182 Malignant neoplasm of ascending colon: Secondary | ICD-10-CM

## 2024-10-24 DIAGNOSIS — Z5111 Encounter for antineoplastic chemotherapy: Secondary | ICD-10-CM | POA: Diagnosis not present

## 2024-10-24 MED ORDER — PEGFILGRASTIM-CBQV 6 MG/0.6ML ~~LOC~~ SOSY
6.0000 mg | PREFILLED_SYRINGE | Freq: Once | SUBCUTANEOUS | Status: AC
  Administered 2024-10-24: 6 mg via SUBCUTANEOUS
  Filled 2024-10-24: qty 0.6

## 2024-10-24 NOTE — Progress Notes (Signed)
 CBG 235 - reported to MD.

## 2024-10-24 NOTE — Patient Instructions (Signed)

## 2024-10-25 ENCOUNTER — Encounter: Payer: Self-pay | Admitting: *Deleted

## 2024-10-25 NOTE — Progress Notes (Signed)
 Faxed 10/27 office note/labs and CBG reading of 10/29 to PCP office re: elevated glucose readings.

## 2024-10-26 ENCOUNTER — Encounter

## 2024-10-29 ENCOUNTER — Other Ambulatory Visit: Payer: Self-pay | Admitting: Oncology

## 2024-10-29 DIAGNOSIS — C182 Malignant neoplasm of ascending colon: Secondary | ICD-10-CM

## 2024-11-05 ENCOUNTER — Encounter: Payer: Self-pay | Admitting: Nurse Practitioner

## 2024-11-05 ENCOUNTER — Inpatient Hospital Stay: Admitting: Nurse Practitioner

## 2024-11-05 ENCOUNTER — Inpatient Hospital Stay (HOSPITAL_BASED_OUTPATIENT_CLINIC_OR_DEPARTMENT_OTHER): Admitting: Nurse Practitioner

## 2024-11-05 ENCOUNTER — Inpatient Hospital Stay

## 2024-11-05 ENCOUNTER — Inpatient Hospital Stay: Attending: Oncology

## 2024-11-05 ENCOUNTER — Other Ambulatory Visit (HOSPITAL_BASED_OUTPATIENT_CLINIC_OR_DEPARTMENT_OTHER): Payer: Self-pay

## 2024-11-05 VITALS — BP 142/94 | HR 73 | Temp 97.8°F | Resp 18 | Ht 66.0 in | Wt 256.1 lb

## 2024-11-05 VITALS — BP 127/74 | HR 68 | Temp 97.8°F | Resp 18

## 2024-11-05 DIAGNOSIS — Z803 Family history of malignant neoplasm of breast: Secondary | ICD-10-CM | POA: Insufficient documentation

## 2024-11-05 DIAGNOSIS — R350 Frequency of micturition: Secondary | ICD-10-CM | POA: Insufficient documentation

## 2024-11-05 DIAGNOSIS — R197 Diarrhea, unspecified: Secondary | ICD-10-CM | POA: Insufficient documentation

## 2024-11-05 DIAGNOSIS — R590 Localized enlarged lymph nodes: Secondary | ICD-10-CM | POA: Insufficient documentation

## 2024-11-05 DIAGNOSIS — Z800 Family history of malignant neoplasm of digestive organs: Secondary | ICD-10-CM | POA: Diagnosis not present

## 2024-11-05 DIAGNOSIS — C182 Malignant neoplasm of ascending colon: Secondary | ICD-10-CM | POA: Diagnosis not present

## 2024-11-05 DIAGNOSIS — C787 Secondary malignant neoplasm of liver and intrahepatic bile duct: Secondary | ICD-10-CM | POA: Diagnosis not present

## 2024-11-05 DIAGNOSIS — C180 Malignant neoplasm of cecum: Secondary | ICD-10-CM | POA: Insufficient documentation

## 2024-11-05 DIAGNOSIS — Z5111 Encounter for antineoplastic chemotherapy: Secondary | ICD-10-CM | POA: Insufficient documentation

## 2024-11-05 DIAGNOSIS — R918 Other nonspecific abnormal finding of lung field: Secondary | ICD-10-CM | POA: Diagnosis not present

## 2024-11-05 LAB — CMP (CANCER CENTER ONLY)
ALT: 29 U/L (ref 0–44)
AST: 33 U/L (ref 15–41)
Albumin: 4.6 g/dL (ref 3.5–5.0)
Alkaline Phosphatase: 280 U/L — ABNORMAL HIGH (ref 38–126)
Anion gap: 13 (ref 5–15)
BUN: 8 mg/dL (ref 6–20)
CO2: 27 mmol/L (ref 22–32)
Calcium: 10.1 mg/dL (ref 8.9–10.3)
Chloride: 95 mmol/L — ABNORMAL LOW (ref 98–111)
Creatinine: 0.95 mg/dL (ref 0.44–1.00)
GFR, Estimated: >60 mL/min (ref >60)
Glucose, Bld: 292 mg/dL — ABNORMAL HIGH (ref 70–99)
Potassium: 3.4 mmol/L — ABNORMAL LOW (ref 3.5–5.1)
Sodium: 136 mmol/L (ref 135–145)
Total Bilirubin: 0.4 mg/dL (ref 0.0–1.2)
Total Protein: 7.3 g/dL (ref 6.5–8.1)

## 2024-11-05 LAB — CBC WITH DIFFERENTIAL (CANCER CENTER ONLY)
Abs Immature Granulocytes: 1.49 K/uL — ABNORMAL HIGH (ref 0.00–0.07)
Basophils Absolute: 0.2 K/uL — ABNORMAL HIGH (ref 0.0–0.1)
Basophils Relative: 1 %
Eosinophils Absolute: 0.1 K/uL (ref 0.0–0.5)
Eosinophils Relative: 1 %
HCT: 35.9 % — ABNORMAL LOW (ref 36.0–46.0)
Hemoglobin: 12.2 g/dL (ref 12.0–15.0)
Immature Granulocytes: 9 %
Lymphocytes Relative: 17 %
Lymphs Abs: 2.7 K/uL (ref 0.7–4.0)
MCH: 32.1 pg (ref 26.0–34.0)
MCHC: 34 g/dL (ref 30.0–36.0)
MCV: 94.5 fL (ref 80.0–100.0)
Monocytes Absolute: 1.4 K/uL — ABNORMAL HIGH (ref 0.1–1.0)
Monocytes Relative: 9 %
Neutro Abs: 10 K/uL — ABNORMAL HIGH (ref 1.7–7.7)
Neutrophils Relative %: 63 %
Platelet Count: 206 K/uL (ref 150–400)
RBC: 3.8 MIL/uL — ABNORMAL LOW (ref 3.87–5.11)
RDW: 14.4 % (ref 11.5–15.5)
WBC Count: 15.9 K/uL — ABNORMAL HIGH (ref 4.0–10.5)
nRBC: 0.4 % — ABNORMAL HIGH (ref 0.0–0.2)

## 2024-11-05 LAB — CEA (ACCESS): CEA (CHCC): 3.39 ng/mL (ref 0.00–5.00)

## 2024-11-05 MED ORDER — SODIUM CHLORIDE 0.9 % IV SOLN
1800.0000 mg/m2 | INTRAVENOUS | Status: DC
  Administered 2024-11-05: 4100 mg via INTRAVENOUS
  Filled 2024-11-05: qty 82

## 2024-11-05 MED ORDER — SODIUM CHLORIDE 0.9 % IV SOLN
300.0000 mg/m2 | Freq: Once | INTRAVENOUS | Status: DC
  Filled 2024-11-05: qty 34.2

## 2024-11-05 MED ORDER — DEXAMETHASONE 4 MG PO TABS
4.0000 mg | ORAL_TABLET | Freq: Two times a day (BID) | ORAL | 4 refills | Status: AC
  Filled 2024-11-05: qty 4, 2d supply, fill #0

## 2024-11-05 MED ORDER — FLUOROURACIL CHEMO INJECTION 2.5 GM/50ML
300.0000 mg/m2 | Freq: Once | INTRAVENOUS | Status: AC
  Administered 2024-11-05: 700 mg via INTRAVENOUS
  Filled 2024-11-05: qty 14

## 2024-11-05 MED ORDER — DEXTROSE 5 % IV SOLN: INTRAVENOUS | Status: DC

## 2024-11-05 MED ORDER — SODIUM CHLORIDE 0.9 % IV SOLN
300.0000 mg/m2 | Freq: Once | INTRAVENOUS | Status: AC
  Administered 2024-11-05: 684 mg via INTRAVENOUS
  Filled 2024-11-05: qty 34.2

## 2024-11-05 MED ORDER — SODIUM CHLORIDE 0.9 % IV SOLN: INTRAVENOUS | Status: DC

## 2024-11-05 NOTE — Patient Instructions (Signed)
 [facility] - A DEPT OF [facility]  Discharge Instructions: Thank you for choosing [facility] to provide your oncology and hematology care.   If you have a lab appointment with the Cancer Center, please go directly to the Cancer Center and check in at the registration area.   Wear comfortable clothing and clothing appropriate for easy access to any Portacath or PICC line.   We strive to give you quality time with your provider. You may need to reschedule your appointment if you arrive late (15 or more minutes).  Arriving late affects you and other patients whose appointments are after yours.  Also, if you miss three or more appointments without notifying the office, you may be dismissed from the clinic at the provider's discretion.      For prescription refill requests, have your pharmacy contact our office and allow 72 hours for refills to be completed.    Today you received the following chemotherapy and/or immunotherapy agents: oxaliplatin , leucovorin , fluorouracil        To help prevent nausea and vomiting after your treatment, we encourage you to take your nausea medication as directed.  BELOW ARE SYMPTOMS THAT SHOULD BE REPORTED IMMEDIATELY: *FEVER GREATER THAN 100.4 F (38 C) OR HIGHER *CHILLS OR SWEATING *NAUSEA AND VOMITING THAT IS NOT CONTROLLED WITH YOUR NAUSEA MEDICATION *UNUSUAL SHORTNESS OF BREATH *UNUSUAL BRUISING OR BLEEDING *URINARY PROBLEMS (pain or burning when urinating, or frequent urination) *BOWEL PROBLEMS (unusual diarrhea, constipation, pain near the anus) TENDERNESS IN MOUTH AND THROAT WITH OR WITHOUT PRESENCE OF ULCERS (sore throat, sores in mouth, or a toothache) UNUSUAL RASH, SWELLING OR PAIN  UNUSUAL VAGINAL DISCHARGE OR ITCHING   Items with * indicate a potential emergency and should be followed up as soon as possible or go to the Emergency Department if any problems should occur.  Please show the CHEMOTHERAPY  ALERT CARD or IMMUNOTHERAPY ALERT CARD at check-in to the Emergency Department and triage nurse.  Should you have questions after your visit or need to cancel or reschedule your appointment, please contact [facility] - A DEPT OF [facility]  Dept: [phone]  and follow the prompts.  Office hours are 8:00 a.m. to 4:30 p.m. Monday - Friday. Please note that voicemails left after 4:00 p.m. may not be returned until the following business day.  We are closed weekends and major holidays. You have access to a nurse at all times for urgent questions. Please call the main number to the clinic Dept: [phone] and follow the prompts.   For any non-urgent questions, you may also contact your provider using MyChart. We now offer e-Visits for anyone 22 and older to request care online for non-urgent symptoms. For details visit [email or web address].   Also download the MyChart app! Go to the app store, search MyChart, open the app, select [facility], and log in with your MyChart username and password.

## 2024-11-05 NOTE — Progress Notes (Signed)
 [facility] OFFICE PROGRESS NOTE   Diagnosis: Colon cancer  INTERVAL HISTORY:   Erin Bradford returns as scheduled.  She completed cycle 9 FOLFOX 10/22/2024.  She has mild intermittent nausea on a continual basis.  The nausea worsens following treatment.  No vomiting.  No mouth sores.  Loose stools beginning day 2, lasting about 4 days.  She estimates 4-5 watery bowel movements per day during that time.  She did not take Imodium .  Persistent cold sensitivity.  She occasionally notices numbness/tingling in the hands and feet without cold exposure.  Energy level is poor.  She does not feel well in general between treatments.  She is considering discontinuing treatment.  Objective:  Vital signs in last 24 hours:  Blood pressure (!) 142/94, pulse 73, temperature 97.8 F (36.6 C), temperature source Temporal, resp. rate 18, height 5' 6 (1.676 m), weight 256 lb 1.6 oz (116.2 kg), last menstrual period 09/17/2017, SpO2 100%.    HEENT: No thrush or ulcers. Resp: Lungs clear bilaterally. Cardio: Regular rate and rhythm. GI: No hepatosplenomegaly.  Nontender. Vascular: No leg edema. Neuro: Vibratory sense intact over the fingertips per tuning fork exam. Skin: Palms without erythema. Port-A-Cath without erythema.  Lab Results:  Lab Results  Component Value Date   WBC 15.9 (H) 11/05/2024   HGB 12.2 11/05/2024   HCT 35.9 (L) 11/05/2024   MCV 94.5 11/05/2024   PLT 206 11/05/2024   NEUTROABS 10.0 (H) 11/05/2024    Imaging:  No results found.  Medications: I have reviewed the patient's current medications.  Assessment/Plan: Colon cancer, cecum, stage IIIb (pT4a, PN1a), Ileocecectomy 04/12/2024-moderately differentiated adenocarcinoma of the cecum, no macroscopic tumor perforation, carcinoma invades into the serosal surface, no lymphovascular perineural invasion, negative resection margins, 1/3 nodes, 1 tumor deposit, normal mismatch repair protein expression,  microsatellite stable CT abdomen/pelvis 04/11/2024: Dilated appendix with extensive periappendiceal inflammatory changes and a 5 cm fluid collection, no enlarged abdominal pelvic lymph nodes, unchanged 9 mm right adrenal nodule from 2018 CT chest 04/21/2024: 1.4 cm anterior mediastinal nodule or node, bilateral tiny nonspecific pulmonary nodules PET scan 06/08/2024-developing metastatic lesions right hemiabdominal mesenteric lymph nodes as well as at least 2 liver metastases.  Anterior superior mediastinal soft tissue nodule is similar to the previous chest CT, no abnormal uptake. Cycle 1 FOLFOX 06/12/2024 MRI liver 06/19/2024-numerous small rim-enhancing hypovascular masses seen throughout the liver.  Right lower quadrant mesenteric lymphadenopathy measuring 1.8 cm. Cycle 2 FOLFOX 06/25/2024 Cycle 3 FOLFOX 07/12/2024 Cycle 4 FOLFOX 07/26/2024, oxaliplatin  dose reduced due to persistent cold sensitivity and mild thrombocytopenia Cycle 5 FOLFOX 08/09/2024 Cycle 6 FOLFOX 08/30/2024 Treatment held 09/13/2024 due to neuropathy, diarrhea following cycle 6, tearing MRI 09/21/2024-significant interval decrease in size and conspicuity of the numerous bilobar hepatic lesions.  No new lesions identified.  Decreased size of right lower quadrant mesenteric lymph nodes. Cycle 7 FOLFOX 09/24/2024, 5-fluorouracil  dose reduced due to diarrhea and tearing Cycle 8 FOLFOX 10/09/2024 Cycle 9 FOLFOX 10/22/2024 Cycle 10 FOLFOX 11/05/2024, oxaliplatin  held due to asthenia/fatigue, Udenyca  held Depression Urinary frequency Hypertension Tremors Tachycardia Asthma Family history of breast and colon cancer Colonoscopy-needs colonoscopy approximately 6 months from diagnosis  Disposition: [name] appears stable.  She has completed 9 cycles of FOLFOX.  She had diarrhea for several days following the most recent cycle.  We discussed trying Imodium  if the diarrhea recurs.  Her main complaint is a lack of energy and in general not  feeling well between treatments.  She does not feel she recovers  prior to the next cycle.  We mutually decided to hold oxaliplatin  today and see if this impacts her recovery.  She and her daughter agree with the above.  Plan to proceed with a cycle of 5-FU/leucovorin  today.  No Udenyca  with this cycle.  CBC and chemistry panel reviewed.  Labs are adequate for treatment.  She will return for follow-up and treatment in 3 weeks rather than 2.  We are available to see her sooner if needed.    Olam Ned ANP/GNP-BC   11/05/2024  11:03 AM

## 2024-11-05 NOTE — Progress Notes (Signed)
 Patient seen by Olam Ned NP today  Vitals are within treatment parameters:No (Please specify and give further instructions.)B/P 142/94 it's okay to proceed  Labs are within treatment parameters: YesK+ 3.4   Treatment plan has been signed: Yes   Per physician team, Patient is ready for treatment. Please note the following modifications: no pre-medication or oxaliplatin 

## 2024-11-05 NOTE — Patient Instructions (Signed)

## 2024-11-07 ENCOUNTER — Inpatient Hospital Stay

## 2024-11-07 NOTE — Patient Instructions (Signed)
 [facility]  Discharge Instructions: Thank you for choosing [facility] to provide your oncology and hematology care.   If you have a lab appointment with the Cancer Center, please go directly to the Cancer Center and check in at the registration area.   Wear comfortable clothing and clothing appropriate for easy access to any Portacath or PICC line.   We strive to give you quality time with your provider. You may need to reschedule your appointment if you arrive late (15 or more minutes).  Arriving late affects you and other patients whose appointments are after yours.  Also, if you miss three or more appointments without notifying the office, you may be dismissed from the clinic at the provider's discretion.      For prescription refill requests, have your pharmacy contact our office and allow 72 hours for refills to be completed.    Today you received the following Pump Stop.    To help prevent nausea and vomiting after your treatment, we encourage you to take your nausea medication as directed.  BELOW ARE SYMPTOMS THAT SHOULD BE REPORTED IMMEDIATELY: *FEVER GREATER THAN 100.4 F (38 C) OR HIGHER *CHILLS OR SWEATING *NAUSEA AND VOMITING THAT IS NOT CONTROLLED WITH YOUR NAUSEA MEDICATION *UNUSUAL SHORTNESS OF BREATH *UNUSUAL BRUISING OR BLEEDING *URINARY PROBLEMS (pain or burning when urinating, or frequent urination) *BOWEL PROBLEMS (unusual diarrhea, constipation, pain near the anus) TENDERNESS IN MOUTH AND THROAT WITH OR WITHOUT PRESENCE OF ULCERS (sore throat, sores in mouth, or a toothache) UNUSUAL RASH, SWELLING OR PAIN  UNUSUAL VAGINAL DISCHARGE OR ITCHING   Items with * indicate a potential emergency and should be followed up as soon as possible or go to the Emergency Department if any problems should occur.  Please show the CHEMOTHERAPY ALERT CARD or IMMUNOTHERAPY ALERT CARD at check-in to the Emergency  Department and triage nurse.  Should you have questions after your visit or need to cancel or reschedule your appointment, please contact [facility]  Dept: [phone]  and follow the prompts.  Office hours are 8:00 a.m. to 4:30 p.m. Monday - Friday. Please note that voicemails left after 4:00 p.m. may not be returned until the following business day.  We are closed weekends and major holidays. You have access to a nurse at all times for urgent questions. Please call the main number to the clinic Dept: [phone] and follow the prompts.   For any non-urgent questions, you may also contact your provider using MyChart. We now offer e-Visits for anyone 68 and older to request care online for non-urgent symptoms. For details visit [email or web address].   Also download the MyChart app! Go to the app store, search "MyChart", open the app, select [place], and log in with your MyChart username and password.

## 2024-11-07 NOTE — Progress Notes (Signed)
 Patient presents today for pump stop. Pump removed without issue. Patients port flushed without difficulty.  Good blood return noted with no bruising or swelling noted at site.  Band aid applied.  VSS with discharge and left in satisfactory condition with no s/s of distress noted.

## 2024-11-12 ENCOUNTER — Encounter (HOSPITAL_COMMUNITY): Payer: Self-pay | Admitting: Clinical

## 2024-11-24 ENCOUNTER — Other Ambulatory Visit: Payer: Self-pay | Admitting: Oncology

## 2024-11-26 ENCOUNTER — Inpatient Hospital Stay

## 2024-11-26 ENCOUNTER — Inpatient Hospital Stay: Admitting: Oncology

## 2024-11-26 ENCOUNTER — Telehealth: Payer: Self-pay | Admitting: Oncology

## 2024-11-26 NOTE — Telephone Encounter (Signed)
 PT CALLED TO CANCEL APPTS. AND RESCHEDULE, LET PT KNOW THAT I WILL GIVE HER A CALL BACK.

## 2024-11-28 ENCOUNTER — Inpatient Hospital Stay

## 2024-12-03 ENCOUNTER — Inpatient Hospital Stay

## 2024-12-03 ENCOUNTER — Inpatient Hospital Stay: Attending: Oncology

## 2024-12-03 ENCOUNTER — Inpatient Hospital Stay: Admitting: Nurse Practitioner

## 2024-12-03 ENCOUNTER — Telehealth: Payer: Self-pay | Admitting: Nurse Practitioner

## 2024-12-03 DIAGNOSIS — C182 Malignant neoplasm of ascending colon: Secondary | ICD-10-CM

## 2024-12-03 NOTE — Telephone Encounter (Signed)
 PT wants to cancel due to her being sick and I will call her back with a new appt time and date.

## 2024-12-05 ENCOUNTER — Inpatient Hospital Stay

## 2024-12-05 ENCOUNTER — Telehealth: Payer: Self-pay | Admitting: Oncology

## 2024-12-05 NOTE — Telephone Encounter (Signed)
Left voicemail with appointment details.

## 2024-12-06 ENCOUNTER — Ambulatory Visit: Payer: Self-pay | Admitting: Oncology

## 2024-12-06 ENCOUNTER — Inpatient Hospital Stay

## 2024-12-06 ENCOUNTER — Inpatient Hospital Stay: Attending: Oncology | Admitting: Oncology

## 2024-12-06 VITALS — BP 108/68 | HR 64 | Temp 97.6°F | Resp 15

## 2024-12-06 VITALS — BP 103/72 | HR 67 | Temp 97.1°F | Resp 18 | Ht 66.0 in | Wt 249.6 lb

## 2024-12-06 DIAGNOSIS — Z5111 Encounter for antineoplastic chemotherapy: Secondary | ICD-10-CM | POA: Insufficient documentation

## 2024-12-06 DIAGNOSIS — R490 Dysphonia: Secondary | ICD-10-CM | POA: Insufficient documentation

## 2024-12-06 DIAGNOSIS — R350 Frequency of micturition: Secondary | ICD-10-CM | POA: Insufficient documentation

## 2024-12-06 DIAGNOSIS — R918 Other nonspecific abnormal finding of lung field: Secondary | ICD-10-CM | POA: Insufficient documentation

## 2024-12-06 DIAGNOSIS — C182 Malignant neoplasm of ascending colon: Secondary | ICD-10-CM

## 2024-12-06 DIAGNOSIS — C180 Malignant neoplasm of cecum: Secondary | ICD-10-CM | POA: Insufficient documentation

## 2024-12-06 DIAGNOSIS — C787 Secondary malignant neoplasm of liver and intrahepatic bile duct: Secondary | ICD-10-CM | POA: Insufficient documentation

## 2024-12-06 DIAGNOSIS — D696 Thrombocytopenia, unspecified: Secondary | ICD-10-CM | POA: Insufficient documentation

## 2024-12-06 DIAGNOSIS — G620 Drug-induced polyneuropathy: Secondary | ICD-10-CM | POA: Insufficient documentation

## 2024-12-06 LAB — CMP (CANCER CENTER ONLY)
ALT: 14 U/L (ref 0–44)
AST: 22 U/L (ref 15–41)
Albumin: 4.5 g/dL (ref 3.5–5.0)
Alkaline Phosphatase: 196 U/L — ABNORMAL HIGH (ref 38–126)
Anion gap: 13 (ref 5–15)
BUN: 11 mg/dL (ref 6–20)
CO2: 27 mmol/L (ref 22–32)
Calcium: 10.1 mg/dL (ref 8.9–10.3)
Chloride: 94 mmol/L — ABNORMAL LOW (ref 98–111)
Creatinine: 1.08 mg/dL — ABNORMAL HIGH (ref 0.44–1.00)
GFR, Estimated: >60 mL/min (ref >60)
Glucose, Bld: 404 mg/dL — ABNORMAL HIGH (ref 70–99)
Potassium: 3.9 mmol/L (ref 3.5–5.1)
Sodium: 133 mmol/L — ABNORMAL LOW (ref 135–145)
Total Bilirubin: 0.4 mg/dL (ref 0.0–1.2)
Total Protein: 7.5 g/dL (ref 6.5–8.1)

## 2024-12-06 LAB — CBC WITH DIFFERENTIAL (CANCER CENTER ONLY)
Abs Immature Granulocytes: 0.02 K/uL (ref 0.00–0.07)
Basophils Absolute: 0.1 K/uL (ref 0.0–0.1)
Basophils Relative: 1 %
Eosinophils Absolute: 0.3 K/uL (ref 0.0–0.5)
Eosinophils Relative: 3 %
HCT: 37.5 % (ref 36.0–46.0)
Hemoglobin: 13 g/dL (ref 12.0–15.0)
Immature Granulocytes: 0 %
Lymphocytes Relative: 27 %
Lymphs Abs: 2.3 K/uL (ref 0.7–4.0)
MCH: 31.4 pg (ref 26.0–34.0)
MCHC: 34.7 g/dL (ref 30.0–36.0)
MCV: 90.6 fL (ref 80.0–100.0)
Monocytes Absolute: 0.8 K/uL (ref 0.1–1.0)
Monocytes Relative: 10 %
Neutro Abs: 5 K/uL (ref 1.7–7.7)
Neutrophils Relative %: 59 %
Platelet Count: 268 K/uL (ref 150–400)
RBC: 4.14 MIL/uL (ref 3.87–5.11)
RDW: 12.3 % (ref 11.5–15.5)
WBC Count: 8.5 K/uL (ref 4.0–10.5)
nRBC: 0 % (ref 0.0–0.2)

## 2024-12-06 LAB — CEA (ACCESS): CEA (CHCC): 3.48 ng/mL (ref 0.00–5.00)

## 2024-12-06 MED ORDER — FLUOROURACIL CHEMO INJECTION 2.5 GM/50ML
300.0000 mg/m2 | Freq: Once | INTRAVENOUS | Status: AC
  Administered 2024-12-06: 700 mg via INTRAVENOUS
  Filled 2024-12-06: qty 14

## 2024-12-06 MED ORDER — SODIUM CHLORIDE 0.9 % IV SOLN
1800.0000 mg/m2 | INTRAVENOUS | Status: DC
  Administered 2024-12-06: 4100 mg via INTRAVENOUS
  Filled 2024-12-06: qty 82

## 2024-12-06 MED ORDER — SODIUM CHLORIDE 0.9 % IV SOLN
300.0000 mg/m2 | Freq: Once | INTRAVENOUS | Status: AC
  Administered 2024-12-06: 684 mg via INTRAVENOUS
  Filled 2024-12-06: qty 34.2

## 2024-12-06 MED ORDER — SODIUM CHLORIDE 0.9 % IV SOLN: INTRAVENOUS | Status: DC

## 2024-12-06 NOTE — Progress Notes (Signed)
 [name] OFFICE PROGRESS NOTE   Diagnosis: Colon cancer  INTERVAL HISTORY:   Erin Bradford completed a cycle of 5-FU/leucovorin  on 11/05/2024.  No mouth sores or emesis.  She reports intermittent nausea.  She has intermittent diarrhea, improved with Imodium .  She has persistent numbness in the extremities, greatest in the toes.  This does not interfere with activity.  She had a cold last week with a sore throat.  She has developed hoarseness this week.  No rhinorrhea.  Objective:  Vital signs in last 24 hours:  Blood pressure 103/72, pulse 67, temperature (!) 97.1 F (36.2 C), temperature source Temporal, resp. rate 18, height 5' 6 (1.676 m), weight 249 lb 9.6 oz (113.2 kg), last menstrual period 09/17/2017, SpO2 96%.    HEENT: No thrush or ulcers Resp: Lungs clear bilaterally Cardio: Regular rate and rhythm GI: Nontender, no hepatosplenomegaly Vascular: No leg edema  Skin: Palms without erythema  Portacath/PICC-without erythema  Lab Results:  Lab Results  Component Value Date   WBC 8.5 12/06/2024   HGB 13.0 12/06/2024   HCT 37.5 12/06/2024   MCV 90.6 12/06/2024   PLT 268 12/06/2024   NEUTROABS 5.0 12/06/2024    CMP  Lab Results  Component Value Date   NA 136 11/05/2024   K 3.4 (L) 11/05/2024   CL 95 (L) 11/05/2024   CO2 27 11/05/2024   GLUCOSE 292 (H) 11/05/2024   BUN 8 11/05/2024   CREATININE 0.95 11/05/2024   CALCIUM  10.1 11/05/2024   PROT 7.3 11/05/2024   ALBUMIN  4.6 11/05/2024   AST 33 11/05/2024   ALT 29 11/05/2024   ALKPHOS 280 (H) 11/05/2024   BILITOT 0.4 11/05/2024   GFRNONAA >60 11/05/2024   GFRAA >60 07/19/2018    Lab Results  Component Value Date   CEA 3.39 11/05/2024    Medications: I have reviewed the patient's current medications.   Assessment/Plan: Colon cancer, cecum, stage IIIb (pT4a, PN1a), Ileocecectomy 04/12/2024-moderately differentiated adenocarcinoma of the cecum, no macroscopic tumor perforation,  carcinoma invades into the serosal surface, no lymphovascular perineural invasion, negative resection margins, 1/3 nodes, 1 tumor deposit, normal mismatch repair protein expression, microsatellite stable CT abdomen/pelvis 04/11/2024: Dilated appendix with extensive periappendiceal inflammatory changes and a 5 cm fluid collection, no enlarged abdominal pelvic lymph nodes, unchanged 9 mm right adrenal nodule from 2018 CT chest 04/21/2024: 1.4 cm anterior mediastinal nodule or node, bilateral tiny nonspecific pulmonary nodules PET scan 06/08/2024-developing metastatic lesions right hemiabdominal mesenteric lymph nodes as well as at least 2 liver metastases.  Anterior superior mediastinal soft tissue nodule is similar to the previous chest CT, no abnormal uptake. Cycle 1 FOLFOX 06/12/2024 MRI liver 06/19/2024-numerous small rim-enhancing hypovascular masses seen throughout the liver.  Right lower quadrant mesenteric lymphadenopathy measuring 1.8 cm. Cycle 2 FOLFOX 06/25/2024 Cycle 3 FOLFOX 07/12/2024 Cycle 4 FOLFOX 07/26/2024, oxaliplatin  dose reduced due to persistent cold sensitivity and mild thrombocytopenia Cycle 5 FOLFOX 08/09/2024 Cycle 6 FOLFOX 08/30/2024 Treatment held 09/13/2024 due to neuropathy, diarrhea following cycle 6, tearing MRI 09/21/2024-significant interval decrease in size and conspicuity of the numerous bilobar hepatic lesions.  No new lesions identified.  Decreased size of right lower quadrant mesenteric lymph nodes. Cycle 7 FOLFOX 09/24/2024, 5-fluorouracil  dose reduced due to diarrhea and tearing Cycle 8 FOLFOX 10/09/2024 Cycle 9 FOLFOX 10/22/2024 Cycle 10 FOLFOX 11/05/2024, oxaliplatin  held due to asthenia/fatigue, Udenyca  held Cycle 11 FOLFOX 12/06/2024, oxaliplatin  held due to neuropathy, Udenyca  held Depression Urinary frequency Hypertension Tremors Tachycardia Asthma Family history of breast and colon cancer  Colonoscopy-needs colonoscopy approximately 6 months from  diagnosis    Disposition: ErinErin Bradford has metastatic colon cancer.  She has completed 10 cycles of systemic therapy.  Oxaliplatin  was held with the last cycle of chemotherapy due to asthenia.  She has peripheral neuropathy symptoms.  She has likely maximized the benefit from oxaliplatin  after receiving 9 cycles.  Oxaliplatin  will be held with chemotherapy today.  The hoarseness is most likely related to recent upper respiratory infection, but it is possible the hoarseness is related oxaliplatin  or metastatic disease involving the mediastinum.  She will complete another cycle of 5-FU/leucovorin  today.  I encouraged her to use Imodium  as needed for diarrhea.  She will undergo restaging chest CT and MRI of the abdomen prior to an office visit in 2 weeks.  Arley Hof, MD  12/06/2024  11:51 AM

## 2024-12-06 NOTE — Progress Notes (Signed)
 Patient seen by Dr. Arley Hof today  Vitals are within treatment parameters:Yes   Labs are within treatment parameters: Yes   Treatment plan has been signed: Yes   Per physician team, Patient is ready for treatment and there are NO modifications to the treatment plan.

## 2024-12-06 NOTE — Patient Instructions (Signed)
 [facility] - A DEPT OF [facility]  Discharge Instructions: Thank you for choosing [facility] to provide your oncology and hematology care.   If you have a lab appointment with the Cancer Center, please go directly to the Cancer Center and check in at the registration area.   Wear comfortable clothing and clothing appropriate for easy access to any Portacath or PICC line.   We strive to give you quality time with your provider. You may need to reschedule your appointment if you arrive late (15 or more minutes).  Arriving late affects you and other patients whose appointments are after yours.  Also, if you miss three or more appointments without notifying the office, you may be dismissed from the clinic at the provider's discretion.      For prescription refill requests, have your pharmacy contact our office and allow 72 hours for refills to be completed.    Today you received the following chemotherapy and/or immunotherapy agents: oxaliplatin , leucovorin , fluorouracil        To help prevent nausea and vomiting after your treatment, we encourage you to take your nausea medication as directed.  BELOW ARE SYMPTOMS THAT SHOULD BE REPORTED IMMEDIATELY: *FEVER GREATER THAN 100.4 F (38 C) OR HIGHER *CHILLS OR SWEATING *NAUSEA AND VOMITING THAT IS NOT CONTROLLED WITH YOUR NAUSEA MEDICATION *UNUSUAL SHORTNESS OF BREATH *UNUSUAL BRUISING OR BLEEDING *URINARY PROBLEMS (pain or burning when urinating, or frequent urination) *BOWEL PROBLEMS (unusual diarrhea, constipation, pain near the anus) TENDERNESS IN MOUTH AND THROAT WITH OR WITHOUT PRESENCE OF ULCERS (sore throat, sores in mouth, or a toothache) UNUSUAL RASH, SWELLING OR PAIN  UNUSUAL VAGINAL DISCHARGE OR ITCHING   Items with * indicate a potential emergency and should be followed up as soon as possible or go to the Emergency Department if any problems should occur.  Please show the CHEMOTHERAPY  ALERT CARD or IMMUNOTHERAPY ALERT CARD at check-in to the Emergency Department and triage nurse.  Should you have questions after your visit or need to cancel or reschedule your appointment, please contact [facility] - A DEPT OF [facility]  Dept: [phone]  and follow the prompts.  Office hours are 8:00 a.m. to 4:30 p.m. Monday - Friday. Please note that voicemails left after 4:00 p.m. may not be returned until the following business day.  We are closed weekends and major holidays. You have access to a nurse at all times for urgent questions. Please call the main number to the clinic Dept: [phone] and follow the prompts.   For any non-urgent questions, you may also contact your provider using MyChart. We now offer e-Visits for anyone 22 and older to request care online for non-urgent symptoms. For details visit [email or web address].   Also download the MyChart app! Go to the app store, search MyChart, open the app, select [facility], and log in with your MyChart username and password.

## 2024-12-08 ENCOUNTER — Inpatient Hospital Stay

## 2024-12-10 ENCOUNTER — Inpatient Hospital Stay

## 2024-12-10 ENCOUNTER — Inpatient Hospital Stay: Admitting: Nurse Practitioner

## 2024-12-10 NOTE — Telephone Encounter (Signed)
 Patient gave verbal understanding and had no further questions.

## 2024-12-10 NOTE — Telephone Encounter (Signed)
-----   Message from Arley Hof, MD sent at 12/06/2024  7:54 PM EST ----- Please call patientf, be sure she is not taking decadron , blood sugar is high, is she checking her blood sugar? She should f/u with primary MD to manage glucose.

## 2024-12-11 ENCOUNTER — Encounter: Payer: Self-pay | Admitting: *Deleted

## 2024-12-11 NOTE — Progress Notes (Signed)
 Per Dr. Cloretta: Cancel lab/flush and chemo on 12/22. Keep OV on 12/22 to review scans. Will reschedule chemo based on scan results. Scheduler made aware of changes.

## 2024-12-12 ENCOUNTER — Inpatient Hospital Stay

## 2024-12-13 ENCOUNTER — Other Ambulatory Visit (HOSPITAL_COMMUNITY)

## 2024-12-14 ENCOUNTER — Ambulatory Visit (HOSPITAL_BASED_OUTPATIENT_CLINIC_OR_DEPARTMENT_OTHER)
Admission: RE | Admit: 2024-12-14 | Discharge: 2024-12-14 | Disposition: A | Discharge status: Home / Self Care | Source: Ambulatory Visit | Attending: Oncology | Admitting: Oncology

## 2024-12-14 ENCOUNTER — Inpatient Hospital Stay

## 2024-12-14 ENCOUNTER — Encounter (HOSPITAL_COMMUNITY): Payer: Self-pay

## 2024-12-14 ENCOUNTER — Ambulatory Visit (HOSPITAL_COMMUNITY)
Admission: RE | Admit: 2024-12-14 | Discharge: 2024-12-14 | Disposition: A | Discharge status: Home / Self Care | Source: Ambulatory Visit | Attending: Oncology | Admitting: Oncology

## 2024-12-14 DIAGNOSIS — C182 Malignant neoplasm of ascending colon: Secondary | ICD-10-CM | POA: Insufficient documentation

## 2024-12-14 MED ORDER — IOHEXOL 300 MG/ML  SOLN
80.0000 mL | Freq: Once | INTRAMUSCULAR | Status: AC | PRN
  Administered 2024-12-14: 80 mL via INTRAVENOUS

## 2024-12-14 NOTE — Patient Instructions (Signed)

## 2024-12-14 NOTE — Progress Notes (Signed)
 Here to have port accessed for CT scan today in radiology. I reminded her that Infusion would be closed after her scan is complete and to ask radiology to de access her or they can send her to the ED. Asked her to not leave the facility with port accessed.

## 2024-12-17 ENCOUNTER — Other Ambulatory Visit: Payer: Self-pay

## 2024-12-17 ENCOUNTER — Inpatient Hospital Stay

## 2024-12-17 ENCOUNTER — Telehealth: Payer: Self-pay

## 2024-12-17 ENCOUNTER — Encounter (HOSPITAL_BASED_OUTPATIENT_CLINIC_OR_DEPARTMENT_OTHER): Payer: Self-pay | Admitting: Emergency Medicine

## 2024-12-17 ENCOUNTER — Inpatient Hospital Stay: Admitting: Oncology

## 2024-12-17 ENCOUNTER — Other Ambulatory Visit (HOSPITAL_BASED_OUTPATIENT_CLINIC_OR_DEPARTMENT_OTHER): Payer: Self-pay

## 2024-12-17 ENCOUNTER — Emergency Department (HOSPITAL_BASED_OUTPATIENT_CLINIC_OR_DEPARTMENT_OTHER)
Admission: EM | Admit: 2024-12-17 | Discharge: 2024-12-17 | Disposition: A | Discharge status: Home / Self Care | Attending: Emergency Medicine | Admitting: Emergency Medicine

## 2024-12-17 VITALS — BP 137/83 | HR 71 | Temp 98.1°F | Resp 18 | Ht 66.0 in | Wt 244.1 lb

## 2024-12-17 DIAGNOSIS — C182 Malignant neoplasm of ascending colon: Secondary | ICD-10-CM

## 2024-12-17 DIAGNOSIS — I129 Hypertensive chronic kidney disease with stage 1 through stage 4 chronic kidney disease, or unspecified chronic kidney disease: Secondary | ICD-10-CM | POA: Insufficient documentation

## 2024-12-17 DIAGNOSIS — E1165 Type 2 diabetes mellitus with hyperglycemia: Secondary | ICD-10-CM | POA: Insufficient documentation

## 2024-12-17 DIAGNOSIS — E119 Type 2 diabetes mellitus without complications: Secondary | ICD-10-CM

## 2024-12-17 DIAGNOSIS — Z79899 Other long term (current) drug therapy: Secondary | ICD-10-CM | POA: Insufficient documentation

## 2024-12-17 DIAGNOSIS — E1122 Type 2 diabetes mellitus with diabetic chronic kidney disease: Secondary | ICD-10-CM | POA: Insufficient documentation

## 2024-12-17 DIAGNOSIS — N189 Chronic kidney disease, unspecified: Secondary | ICD-10-CM | POA: Insufficient documentation

## 2024-12-17 DIAGNOSIS — Z85038 Personal history of other malignant neoplasm of large intestine: Secondary | ICD-10-CM | POA: Insufficient documentation

## 2024-12-17 LAB — I-STAT VENOUS BLOOD GAS, ED
Acid-Base Excess: 1 mmol/L (ref 0.0–2.0)
Bicarbonate: 26.6 mmol/L (ref 20.0–28.0)
Calcium, Ion: 1.11 mmol/L — ABNORMAL LOW (ref 1.15–1.40)
HCT: 38 % (ref 36.0–46.0)
Hemoglobin: 12.9 g/dL (ref 12.0–15.0)
O2 Saturation: 58 %
Patient temperature: 98.6
Potassium: 4 mmol/L (ref 3.5–5.1)
Sodium: 126 mmol/L — ABNORMAL LOW (ref 135–145)
TCO2: 28 mmol/L (ref 22–32)
pCO2, Ven: 43.6 mmHg — ABNORMAL LOW (ref 44–60)
pH, Ven: 7.394 (ref 7.25–7.43)
pO2, Ven: 31 mmHg — CL (ref 32–45)

## 2024-12-17 LAB — CBC WITH DIFFERENTIAL/PLATELET
Abs Immature Granulocytes: 0.01 K/uL (ref 0.00–0.07)
Abs Immature Granulocytes: 0.02 K/uL (ref 0.00–0.07)
Basophils Absolute: 0.1 K/uL (ref 0.0–0.1)
Basophils Absolute: 0 K/uL (ref 0.0–0.1)
Basophils Relative: 1 %
Basophils Relative: 1 %
Eosinophils Absolute: 0.2 K/uL (ref 0.0–0.5)
Eosinophils Absolute: 0.1 K/uL (ref 0.0–0.5)
Eosinophils Relative: 3 %
Eosinophils Relative: 2 %
HCT: 37.7 % (ref 36.0–46.0)
HCT: 35.4 % — ABNORMAL LOW (ref 36.0–46.0)
Hemoglobin: 13.1 g/dL (ref 12.0–15.0)
Hemoglobin: 12.2 g/dL (ref 12.0–15.0)
Immature Granulocytes: 0 %
Immature Granulocytes: 0 %
Lymphocytes Relative: 35 %
Lymphocytes Relative: 26 %
Lymphs Abs: 2.2 K/uL (ref 0.7–4.0)
Lymphs Abs: 1.5 K/uL (ref 0.7–4.0)
MCH: 31.1 pg (ref 26.0–34.0)
MCH: 30.9 pg (ref 26.0–34.0)
MCHC: 34.7 g/dL (ref 30.0–36.0)
MCHC: 34.5 g/dL (ref 30.0–36.0)
MCV: 89.5 fL (ref 80.0–100.0)
MCV: 89.6 fL (ref 80.0–100.0)
Monocytes Absolute: 0.8 K/uL (ref 0.1–1.0)
Monocytes Absolute: 0.6 K/uL (ref 0.1–1.0)
Monocytes Relative: 12 %
Monocytes Relative: 10 %
Neutro Abs: 3.2 K/uL (ref 1.7–7.7)
Neutro Abs: 3.6 K/uL (ref 1.7–7.7)
Neutrophils Relative %: 49 %
Neutrophils Relative %: 61 %
Platelets: 299 K/uL (ref 150–400)
Platelets: 271 K/uL (ref 150–400)
RBC: 4.21 MIL/uL (ref 3.87–5.11)
RBC: 3.95 MIL/uL (ref 3.87–5.11)
RDW: 12.6 % (ref 11.5–15.5)
RDW: 12.5 % (ref 11.5–15.5)
WBC: 6.4 K/uL (ref 4.0–10.5)
WBC: 5.9 K/uL (ref 4.0–10.5)
nRBC: 0 % (ref 0.0–0.2)
nRBC: 0 % (ref 0.0–0.2)

## 2024-12-17 LAB — BETA-HYDROXYBUTYRIC ACID
Beta-Hydroxybutyric Acid: 0.67 mmol/L — ABNORMAL HIGH (ref 0.05–0.27)
Beta-Hydroxybutyric Acid: 0.45 mmol/L — ABNORMAL HIGH (ref 0.05–0.27)

## 2024-12-17 LAB — URINALYSIS, ROUTINE W REFLEX MICROSCOPIC
Bacteria, UA: NONE SEEN
Bilirubin Urine: NEGATIVE
Glucose, UA: >1000 mg/dL — AB
Hgb urine dipstick: NEGATIVE
Ketones, ur: NEGATIVE mg/dL
Leukocytes,Ua: NEGATIVE
Nitrite: NEGATIVE
Protein, ur: NEGATIVE mg/dL
Specific Gravity, Urine: 1.033 — ABNORMAL HIGH (ref 1.005–1.030)
pH: 5.5 (ref 5.0–8.0)

## 2024-12-17 LAB — COMPREHENSIVE METABOLIC PANEL WITH GFR
ALT: 15 U/L (ref 0–44)
AST: 19 U/L (ref 15–41)
Albumin: 4.8 g/dL (ref 3.5–5.0)
Alkaline Phosphatase: 185 U/L — ABNORMAL HIGH (ref 38–126)
Anion gap: 12 (ref 5–15)
BUN: 10 mg/dL (ref 6–20)
CO2: 28 mmol/L (ref 22–32)
Calcium: 10.8 mg/dL — ABNORMAL HIGH (ref 8.9–10.3)
Chloride: 92 mmol/L — ABNORMAL LOW (ref 98–111)
Creatinine, Ser: 1.1 mg/dL — ABNORMAL HIGH (ref 0.44–1.00)
GFR, Estimated: 59 mL/min — ABNORMAL LOW
Glucose, Bld: 799 mg/dL (ref 70–99)
Potassium: 4.1 mmol/L (ref 3.5–5.1)
Sodium: 132 mmol/L — ABNORMAL LOW (ref 135–145)
Total Bilirubin: 0.4 mg/dL (ref 0.0–1.2)
Total Protein: 7.7 g/dL (ref 6.5–8.1)

## 2024-12-17 LAB — HEMOGLOBIN A1C
Hgb A1c MFr Bld: 12 % — ABNORMAL HIGH (ref 4.8–5.6)
Mean Plasma Glucose: 297.7 mg/dL

## 2024-12-17 LAB — BASIC METABOLIC PANEL WITH GFR
Anion gap: 12 (ref 5–15)
Anion gap: 10 (ref 5–15)
BUN: 10 mg/dL (ref 6–20)
BUN: 8 mg/dL (ref 6–20)
CO2: 25 mmol/L (ref 22–32)
CO2: 28 mmol/L (ref 22–32)
Calcium: 10.2 mg/dL (ref 8.9–10.3)
Calcium: 10 mg/dL (ref 8.9–10.3)
Chloride: 90 mmol/L — ABNORMAL LOW (ref 98–111)
Chloride: 97 mmol/L — ABNORMAL LOW (ref 98–111)
Creatinine, Ser: 1.07 mg/dL — ABNORMAL HIGH (ref 0.44–1.00)
Creatinine, Ser: 0.86 mg/dL (ref 0.44–1.00)
GFR, Estimated: >60 mL/min
GFR, Estimated: >60 mL/min
Glucose, Bld: 777 mg/dL (ref 70–99)
Glucose, Bld: 432 mg/dL — ABNORMAL HIGH (ref 70–99)
Potassium: 4.1 mmol/L (ref 3.5–5.1)
Potassium: 3.7 mmol/L (ref 3.5–5.1)
Sodium: 128 mmol/L — ABNORMAL LOW (ref 135–145)
Sodium: 135 mmol/L (ref 135–145)

## 2024-12-17 LAB — CBG MONITORING, ED: Glucose-Capillary: 383 mg/dL — ABNORMAL HIGH (ref 70–99)

## 2024-12-17 MED ORDER — METFORMIN HCL ER 500 MG PO TB24
500.0000 mg | ORAL_TABLET | Freq: Every day | ORAL | 1 refills | Status: AC
  Filled 2024-12-17: qty 60, 30d supply, fill #0

## 2024-12-17 MED ORDER — LACTATED RINGERS IV BOLUS
20.0000 mL/kg | Freq: Once | INTRAVENOUS | Status: AC
  Administered 2024-12-17: 2200 mL via INTRAVENOUS

## 2024-12-17 MED ORDER — DIPHENOXYLATE-ATROPINE 2.5-0.025 MG PO TABS: 2.0000 | ORAL_TABLET | Freq: Four times a day (QID) | ORAL | 3 refills | Status: DC | PRN

## 2024-12-17 MED ORDER — INSULIN GLARGINE 100 UNITS/ML SOLOSTAR PEN
10.0000 [IU] | PEN_INJECTOR | Freq: Every day | SUBCUTANEOUS | 0 refills | Status: AC
  Filled 2024-12-17: qty 3, 30d supply, fill #0

## 2024-12-17 MED ORDER — BLOOD GLUCOSE MONITOR KIT
PACK | 0 refills | Status: AC
  Filled 2024-12-17: qty 1, 1d supply, fill #0

## 2024-12-17 MED ORDER — INSULIN PEN NEEDLE 32G X 4 MM MISC
0 refills | Status: AC
  Filled 2024-12-17: qty 100, 100d supply, fill #0

## 2024-12-17 MED ORDER — INSULIN GLARGINE-YFGN 100 UNIT/ML ~~LOC~~ SOLN
10.0000 [IU] | Freq: Once | SUBCUTANEOUS | Status: AC
  Administered 2024-12-17: 10 [IU] via SUBCUTANEOUS
  Filled 2024-12-17: qty 100

## 2024-12-17 MED ORDER — METFORMIN HCL 500 MG PO TABS
500.0000 mg | ORAL_TABLET | Freq: Once | ORAL | Status: AC
  Administered 2024-12-17: 500 mg via ORAL
  Filled 2024-12-17: qty 1

## 2024-12-17 MED ORDER — HEPARIN SOD (PORK) LOCK FLUSH 100 UNIT/ML IV SOLN
500.0000 [IU] | INTRAVENOUS | Status: AC | PRN
  Administered 2024-12-17: 500 [IU]
  Filled 2024-12-17: qty 5

## 2024-12-17 NOTE — ED Provider Notes (Addendum)
 F/u cmp for improved CBG. Physical Exam  BP (!) 173/76 (BP Location: Right Arm)   Pulse (!) 54   Temp (!) 97.5 F (36.4 C) (Oral)   Resp 18   Ht 5' 6.5 (1.689 m)   Wt 110 kg   LMP 09/17/2017   SpO2 99%   BMI 38.56 kg/m   Physical Exam  Procedures  Procedures  ED Course / MDM   Clinical Course as of 12/17/24 1736  Mon Dec 17, 2024  1336 Glucose(!!): 777 [JL]  1338 Glucose, UA(!): >1,000 [JL]  1339 pH, Ven: 7.394 [JL]  1339 CO2: 25 [JL]  1339 Anion gap: 12 [JL]  1425 Hemoglobin A1C(!): 12.0 [JL]    Clinical Course User Index [JL] Jerrol Agent, MD   Medical Decision Making Amount and/or Complexity of Data Reviewed Labs: ordered. Decision-making details documented in ED Course.  Risk OTC drugs. Prescription drug management.   Patient is alert and nontoxic and well in appearance.  She does not show any signs of altered mental status, respiratory distress or general constitutional illness.  Patient has gone from prediabetes to diabetic during the course of her chemotherapy treatment.  Her hemoglobin A1c has gone up dramatically to over 12 and presenting blood sugar is at 777.  Review of labs does not show patient has hyperosmolar crisis.  Patient has had IV fluid resuscitation.  Repeat CBG is 383.  I have consulted pharmacy regarding patient's conversion from prediabetes to diabetic with substantial change in A1c with chemotherapy.  At this time recommendation is to start metformin  XR 500 daily and after a week increase to 1000 mg daily.  Other recommendation is to start Lantus  10 units daily.  Consult to diabetes coordinator placed by nursing staff.  Patient reports that her blood sugars were running in the 400s about a week ago.  Patient reports that her mother is diabetic and is familiar with monitoring CBGs and administration of insulin .  Patient is stable to initiate outpatient treatment with careful return precautions and a follow-up plan in place.       Erin Canning, MD 12/17/24 1650    Erin Canning, MD 12/17/24 [phone]

## 2024-12-17 NOTE — Telephone Encounter (Signed)
 Received confirmation of successful fax transmission of signed lomotil prescription.

## 2024-12-17 NOTE — Telephone Encounter (Signed)
 Attempted to call patient on primary number regarding today's appointment. No answer. Left voicemail requesting patient to return call to center.
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
## 2024-12-17 NOTE — Discharge Instructions (Addendum)
 Hemoglobin A1C was elevated at 12 which is consistent with a diagnosis of new onset diabetes mellitus.  He is follow-up with your primary care provider for continued outpatient management.  A referral for follow-up with endocrinology has been placed.

## 2024-12-17 NOTE — ED Notes (Signed)
 Pt taken to restroom via wheelchair.  No issues, IV fluids continue to infuse without issue.

## 2024-12-17 NOTE — ED Triage Notes (Signed)
 Reports was seen at her oncology appointment , they checked her CBG was over 600, and 704 from blood test .  No Hx diabetes yet pre diabetic .  Reports been  thirsty and dizzy . Lethargic .  Hx colon cancer

## 2024-12-17 NOTE — ED Provider Notes (Signed)
 " [place] EMERGENCY DEPARTMENT AT [facility] Provider Note   CSN: [id number] Arrival date & time: 12/17/24  1012     Patient presents with: Hyperglycemia   Erin Bradford is a 53 y.o. female.    Hyperglycemia    53 year old female with medical history significant for HTN, obesity, depression, GERD, CKD, colon cancer on chemotherapy, prediabetes who presents to the emergency department from her oncology appointment with concern for new onset diabetes and hyperglycemia.  The patient CBG was over 600 and she was sent to the emergency department for further evaluation.  She endorses several days of polyuria and polydipsia.  She denies any abdominal pain, fevers, chills.  She denies any burning while urinating, no cough, chest pain.  She has been more fatigued compared to usual.  Prior to Admission medications  Medication Sig Start Date End Date Taking? Authorizing Provider  acetaminophen  (TYLENOL ) 500 MG tablet Take 2 tablets (1,000 mg total) by mouth every 6 (six) hours. Patient not taking: Reported on 12/06/2024 04/24/24   Rosalba Glendale DEL, PA-C  albuterol  (PROVENTIL ) (2.5 MG/3ML) 0.083% nebulizer solution USE 1 VIAL VIA NEBULIZER EVERY 6 HOURS AS NEEDED FOR WHEEZING OR  SHORTNESS OF BREATH Patient not taking: Reported on 12/06/2024 08/08/23   Paseda, Folashade R, FNP  buPROPion  (WELLBUTRIN  XL) 300 MG 24 hr tablet Take 1 tablet (300 mg total) by mouth daily. 10/02/24   Arfeen, Leni DASEN, MD  carvedilol  (COREG ) 25 MG tablet TAKE 1 TABLET BY MOUTH TWICE  DAILY 06/04/24   Camnitz, Soyla Lunger, MD  cetirizine  (ZYRTEC ) 10 MG tablet TAKE 1 TABLET(10 MG) BY MOUTH AT BEDTIME AS NEEDED FOR ALLERGIES 03/23/23   Paseda, Folashade R, FNP  dexamethasone  (DECADRON ) 4 MG tablet Take 1 tablet (4 mg total) by mouth 2 (two) times daily with a meal. Take for 2 days beginning day pump is removed Patient not taking: Reported on 12/06/2024 11/05/24   Debby Olam POUR, NP  diphenoxylate-atropine  (LOMOTIL) 2.5-0.025 MG tablet Take 2 tablets by mouth 4 (four) times daily as needed for diarrhea or loose stools. 12/17/24   Cloretta Arley NOVAK, MD  famotidine  (PEPCID ) 20 MG tablet TAKE 1 TABLET(20 MG) BY MOUTH TWICE DAILY 05/15/20   Stroud, Natalie M, FNP  fluticasone  (FLONASE ) 50 MCG/ACT nasal spray SHAKE LIQUID AND USE 2 SPRAYS IN EACH NOSTRIL DAILY 03/23/23   Paseda, Folashade R, FNP  furosemide  (LASIX ) 20 MG tablet Take 20 mg by mouth 2 (two) times daily.     [provider]  gabapentin  (NEURONTIN ) 300 MG capsule Take 600 mg by mouth 3 (three) times daily. 06/15/21   Maree Cesar HERO, MD  lidocaine -prilocaine  (EMLA ) cream Apply to port site 1-2 hours prior to use 06/25/24   Thomas, Lisa K, NP  loperamide  (IMODIUM  A-D) 2 MG tablet Take 1 tablet (2 mg total) by mouth 4 (four) times daily as needed for diarrhea or loose stools. 05/21/24   Polly Cordella LABOR, MD  losartan  (COZAAR ) 100 MG tablet Take 100 mg by mouth daily. 07/09/24   [provider]  magic mouthwash SOLN Take 5 mLs by mouth 4 (four) times daily as needed for mouth pain (swish x 1 minute and spit or swallow). Patient not taking: Reported on 12/06/2024 07/09/24   Cloretta Arley NOVAK, MD  ondansetron  (ZOFRAN ) 8 MG tablet Take 1 tablet (8 mg total) by mouth every 8 (eight) hours as needed for nausea or vomiting. Do not start until 72 hours after day 1 chemo 08/30/24  Debby Olam POUR, NP  oxybutynin  (DITROPAN -XL) 10 MG 24 hr tablet TAKE 1 TABLET BY MOUTH AT  BEDTIME 04/04/24   Nichols, Tonya S, NP  oxyCODONE -acetaminophen  (PERCOCET) 10-325 MG tablet Take 1 tablet by mouth every 6 (six) hours as needed. Patient not taking: Reported on 12/06/2024    [provider]  primidone  (MYSOLINE ) 50 MG tablet TAKE 1 TABLET BY MOUTH EVERY MORNING AND 6 TABLETS EVERY EVENING 06/21/24   Skeet, Adam R, DO  prochlorperazine  (COMPAZINE ) 10 MG tablet Take 1 tablet (10 mg total) by mouth every 6 (six) hours as needed for nausea or vomiting. 08/30/24    Debby Olam POUR, NP  sertraline  (ZOLOFT ) 100 MG tablet Take 1 tablet (100 mg total) by mouth daily. 10/02/24 12/31/24  Arfeen, Leni DASEN, MD  spironolactone  (ALDACTONE ) 100 MG tablet Take 100 mg by mouth 2 (two) times daily. 05/08/21   Maree Cesar HERO, MD  SYMBICORT  80-4.5 MCG/ACT inhaler USE 2 INHALATIONS BY MOUTH TWICE DAILY 02/29/24   Paseda, Folashade R, FNP  tiZANidine  (ZANAFLEX ) 4 MG tablet Take 4 mg by mouth 3 (three) times daily as needed for muscle spasms.    [provider]  VENTOLIN  HFA 108 (90 Base) MCG/ACT inhaler USE 2 INHALATIONS BY MOUTH EVERY 6 HOURS AS NEEDED FOR WHEEZING  OR SHORTNESS OF BREATH 08/08/23   Paseda, Folashade R, FNP  zolpidem  (AMBIEN  CR) 12.5 MG CR tablet Take 1 tablet (12.5 mg total) by mouth at bedtime as needed for sleep. 10/02/24   Arfeen, Leni DASEN, MD    Allergies: Ace inhibitors    Review of Systems  All other systems reviewed and are negative.   Updated Vital Signs BP 96/62 (BP Location: Right Arm)   Pulse 62   Temp 97.8 F (36.6 C) (Oral)   Resp 16   Ht 5' 6.5 (1.689 m)   Wt 110 kg   LMP 09/17/2017   SpO2 97%   BMI 38.56 kg/m   Physical Exam Vitals and nursing note reviewed.  Constitutional:      General: She is not in acute distress.    Appearance: She is well-developed.  HENT:     Head: Normocephalic and atraumatic.     Mouth/Throat:     Mouth: Mucous membranes are dry.  Eyes:     Conjunctiva/sclera: Conjunctivae normal.  Cardiovascular:     Rate and Rhythm: Normal rate and regular rhythm.  Pulmonary:     Effort: Pulmonary effort is normal. No respiratory distress.     Breath sounds: Normal breath sounds.  Abdominal:     Palpations: Abdomen is soft.     Tenderness: There is no abdominal tenderness.  Musculoskeletal:        General: No swelling.     Cervical back: Neck supple.  Skin:    General: Skin is warm and dry.     Capillary Refill: Capillary refill takes less than 2 seconds.  Neurological:     Mental Status: She is alert.   Psychiatric:        Mood and Affect: Mood normal.     (all labs ordered are listed, but only abnormal results are displayed) Labs Reviewed - No data to display  EKG: None  Radiology: No results found.   Procedures   Medications Ordered in the ED - No data to display  Clinical Course as of 12/17/24 1456  Mon Dec 17, 2024  1336 Glucose(!!): 777 [JL]  1338 Glucose, UA(!): >1,000 [JL]  1339 pH, Ven: 7.394 [JL]  1339 CO2: 25 [JL]  1339 Anion gap: 12 [JL]  1425 Hemoglobin A1C(!): 12.0 [JL]    Clinical Course User Index [JL] Jerrol Agent, MD                                 Medical Decision Making Amount and/or Complexity of Data Reviewed Labs: ordered. Decision-making details documented in ED Course.  Risk OTC drugs. Prescription drug management.    53 year old female with medical history significant for HTN, obesity, depression, GERD, CKD, colon cancer on chemotherapy, prediabetes who presents to the emergency department from her oncology appointment with concern for new onset diabetes and hyperglycemia.  The patient CBG was over 600 and she was sent to the emergency department for further evaluation.  She endorses several days of polyuria and polydipsia.  She denies any abdominal pain, fevers, chills.  She denies any burning while urinating, no cough, chest pain.  She has been more fatigued compared to usual.  On arrival, the patient was vitally stable, afebrile, not tachycardic or tachypneic, initially soft blood pressure 96/62, saturating 97% on room air.  Patient with dry mucous membranes on exam.  Presenting with polyuria and polydipsia concerning for new onset diabetes and hyperglycemia at her outpatient office visit.  Additionally considered HHS, DKA.  Workup initiated for the same.  IV access was obtained and the patient was administered over 2 L of IV fluids for volume resuscitation.  Initial labs concerning for hyperglycemia without evidence of DKA, VBG with a pH  of 7.39, BMP with a blood glucose of 777, urinalysis with glucosuria, negative for UTI, bicarbonate 25, anion gap 12.  Hemoglobin A1c was performed and was significantly elevated at 12.  Informed the patient of her diagnosis of diabetes.  Plan at time of signout to recheck the patient's BMP, if appropriately downtrending, patient can follow-up outpatient with endocrinology and her PCP for continued outpatient management of likely new onset diabetes.  Signout given to Dr. Armenta at 1500, ultimate disposition pending results of diagnostic testing and reassessment.       Final diagnoses:  None    ED Discharge Orders     None          Jerrol Agent, MD 12/17/24 1759  "

## 2024-12-17 NOTE — TOC CM/SW Note (Signed)
 TOC consult received for DM education. CM spoke with Consulting Civil Engineer. Patient's RN to provide DM education. Patient to be encouraged to follow-up with her pcp/endocrine as directed. Scripts for DM testing supplies to be provided.   Merilee Batty, MSN, RN Case Management [phone]

## 2024-12-18 ENCOUNTER — Other Ambulatory Visit (HOSPITAL_BASED_OUTPATIENT_CLINIC_OR_DEPARTMENT_OTHER): Payer: Self-pay

## 2024-12-18 ENCOUNTER — Other Ambulatory Visit: Payer: Self-pay

## 2024-12-19 ENCOUNTER — Other Ambulatory Visit (HOSPITAL_BASED_OUTPATIENT_CLINIC_OR_DEPARTMENT_OTHER): Payer: Self-pay

## 2024-12-19 ENCOUNTER — Inpatient Hospital Stay

## 2024-12-19 ENCOUNTER — Other Ambulatory Visit: Payer: Self-pay

## 2024-12-22 ENCOUNTER — Other Ambulatory Visit (HOSPITAL_BASED_OUTPATIENT_CLINIC_OR_DEPARTMENT_OTHER): Payer: Self-pay

## 2024-12-23 ENCOUNTER — Other Ambulatory Visit: Payer: Self-pay | Admitting: Oncology

## 2024-12-23 DIAGNOSIS — C182 Malignant neoplasm of ascending colon: Secondary | ICD-10-CM

## 2024-12-25 ENCOUNTER — Telehealth: Payer: Self-pay

## 2024-12-25 MED ORDER — SODIUM CHLORIDE 0.9 % IV SOLN
1800.0000 mg/m2 | INTRAVENOUS | Status: DC
  Filled 2024-12-25: qty 82

## 2024-12-25 NOTE — Telephone Encounter (Signed)
 Confirmed with patients daughter that appointments on Wednesday, 12/26/2024, will begin at 0900. She verbalized understanding and stated she would inform her mother.

## 2024-12-26 ENCOUNTER — Other Ambulatory Visit: Payer: Self-pay

## 2024-12-26 ENCOUNTER — Inpatient Hospital Stay

## 2024-12-26 VITALS — BP 142/91 | HR 71 | Temp 97.8°F | Resp 16 | Wt 241.0 lb

## 2024-12-26 DIAGNOSIS — C182 Malignant neoplasm of ascending colon: Secondary | ICD-10-CM

## 2024-12-26 LAB — CBC WITH DIFFERENTIAL (CANCER CENTER ONLY)
Abs Immature Granulocytes: 0.01 K/uL (ref 0.00–0.07)
Basophils Absolute: 0.1 K/uL (ref 0.0–0.1)
Basophils Relative: 1 %
Eosinophils Absolute: 0.3 K/uL (ref 0.0–0.5)
Eosinophils Relative: 4 %
HCT: 37.8 % (ref 36.0–46.0)
Hemoglobin: 13.1 g/dL (ref 12.0–15.0)
Immature Granulocytes: 0 %
Lymphocytes Relative: 32 %
Lymphs Abs: 2.1 K/uL (ref 0.7–4.0)
MCH: 30.8 pg (ref 26.0–34.0)
MCHC: 34.7 g/dL (ref 30.0–36.0)
MCV: 88.7 fL (ref 80.0–100.0)
Monocytes Absolute: 0.8 K/uL (ref 0.1–1.0)
Monocytes Relative: 12 %
Neutro Abs: 3.4 K/uL (ref 1.7–7.7)
Neutrophils Relative %: 51 %
Platelet Count: 304 K/uL (ref 150–400)
RBC: 4.26 MIL/uL (ref 3.87–5.11)
RDW: 12.9 % (ref 11.5–15.5)
WBC Count: 6.7 K/uL (ref 4.0–10.5)
nRBC: 0 % (ref 0.0–0.2)

## 2024-12-26 LAB — CMP (CANCER CENTER ONLY)
ALT: 8 U/L (ref 0–44)
AST: 16 U/L (ref 15–41)
Albumin: 4.3 g/dL (ref 3.5–5.0)
Alkaline Phosphatase: 172 U/L — ABNORMAL HIGH (ref 38–126)
Anion gap: 14 (ref 5–15)
BUN: 15 mg/dL (ref 6–20)
CO2: 23 mmol/L (ref 22–32)
Calcium: 10.6 mg/dL — ABNORMAL HIGH (ref 8.9–10.3)
Chloride: 89 mmol/L — ABNORMAL LOW (ref 98–111)
Creatinine: 1.01 mg/dL — ABNORMAL HIGH (ref 0.44–1.00)
GFR, Estimated: >60 mL/min
Glucose, Bld: 609 mg/dL (ref 70–99)
Potassium: 4.5 mmol/L (ref 3.5–5.1)
Sodium: 126 mmol/L — ABNORMAL LOW (ref 135–145)
Total Bilirubin: 0.4 mg/dL (ref 0.0–1.2)
Total Protein: 7.3 g/dL (ref 6.5–8.1)

## 2024-12-26 NOTE — Patient Instructions (Signed)

## 2024-12-26 NOTE — Progress Notes (Signed)
 Patient presents today for lab/flush and 5-FU home pump administration. On assessment patient reported she passed out this past Sunday (12/23/24) while on her way to the bathroom. She reports she fell  forward onto her abdomen and hit her right cheek on the floor. She states she returned to consciousness almost immediately and was able to get herself off the floor. She reports generalized achiness and discomfort, but denies any areas of isolated pain (other than her chronic back pain which was exasperated from her fall). She reports a decreased appetite for the past 2 weeks, but reports she has been diligent about increasing fluid intake. Denies constipation or diarrhea and reports last BM as 12/25/24.  Patient's blood glucose was 609 today. Per Dr. Cloretta: plan to hold 5-FU pump administration. Patient verbally confirms that she is taking SQ insulin  and lantus  as prescribed as well as PO metformin . Patient was advised to proceed to ER for elevated glucose management, but declined stating that last time she went it was a 9+ hour wait and she has an appointment with her PCP on 01/04/25. Desk nurse called patient's PCP office directly to see if they could see patient sooner, and was advised that their office will contact patient directly  Patient's port was deaccessed per facility protocol. Patient was also provided the number to reschedule her MRI. Patient ambulated out of clinic with rollater without incident   Vitals:   12/26/24 1003  BP: (!) 142/91  Pulse: 71  Resp: 16  Temp: 97.8 F (36.6 C)  SpO2: 95%

## 2024-12-26 NOTE — Progress Notes (Signed)
 CRITICAL VALUE STICKER  CRITICAL VALUE: Glucose:  609 RECEIVER (on-site recipient of call): Keene Crown, RN DATE & TIME NOTIFIED:  12/26/24   1020 MESSENGER (representative from lab): Thersia  MD NOTIFIED:  Olam Ned, NP TIME OF NOTIFICATION: 1021 RESPONSE:  Aware

## 2024-12-28 ENCOUNTER — Inpatient Hospital Stay

## 2024-12-28 ENCOUNTER — Telehealth: Payer: Self-pay | Admitting: *Deleted
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
## 2024-12-29 ENCOUNTER — Ambulatory Visit (HOSPITAL_COMMUNITY)

## 2024-12-31 ENCOUNTER — Inpatient Hospital Stay

## 2024-12-31 ENCOUNTER — Inpatient Hospital Stay: Admitting: Nurse Practitioner

## 2025-01-01 ENCOUNTER — Telehealth (HOSPITAL_BASED_OUTPATIENT_CLINIC_OR_DEPARTMENT_OTHER): Admitting: Psychiatry

## 2025-01-01 ENCOUNTER — Encounter (HOSPITAL_COMMUNITY): Payer: Self-pay | Admitting: Psychiatry

## 2025-01-01 ENCOUNTER — Other Ambulatory Visit (HOSPITAL_BASED_OUTPATIENT_CLINIC_OR_DEPARTMENT_OTHER): Payer: Self-pay

## 2025-01-01 ENCOUNTER — Other Ambulatory Visit: Payer: Self-pay

## 2025-01-01 VITALS — Wt 241.0 lb

## 2025-01-01 DIAGNOSIS — F411 Generalized anxiety disorder: Secondary | ICD-10-CM | POA: Diagnosis not present

## 2025-01-01 DIAGNOSIS — F330 Major depressive disorder, recurrent, mild: Secondary | ICD-10-CM

## 2025-01-01 DIAGNOSIS — F5101 Primary insomnia: Secondary | ICD-10-CM

## 2025-01-01 MED ORDER — SERTRALINE HCL 100 MG PO TABS
100.0000 mg | ORAL_TABLET | Freq: Every day | ORAL | 0 refills | Status: AC
  Filled 2025-01-01: qty 90, 90d supply, fill #0

## 2025-01-01 MED ORDER — ZOLPIDEM TARTRATE ER 12.5 MG PO TBCR
12.5000 mg | EXTENDED_RELEASE_TABLET | Freq: Every evening | ORAL | 2 refills | Status: AC | PRN
  Filled 2025-01-01 – 2025-01-02 (×2): qty 30, 30d supply, fill #0

## 2025-01-01 MED ORDER — BUPROPION HCL ER (XL) 300 MG PO TB24
300.0000 mg | ORAL_TABLET | Freq: Every day | ORAL | 0 refills | Status: AC
  Filled 2025-01-01: qty 90, 90d supply, fill #0

## 2025-01-01 NOTE — Progress Notes (Signed)
 " [facility] MD Virtual Progress Note   Patient Location: Home Provider Location: Home Office  I connect with patient by video and verified that I am speaking with correct person by using two identifiers. I discussed the limitations of evaluation and management by telemedicine and the availability of in person appointments. I also discussed with the patient that there may be a patient responsible charge related to this service. The patient expressed understanding and agreed to proceed.  Erin Bradford [id number] 54 y.o.  01/01/2025 10:44 AM  History of Present Illness:  Patient is evaluated by video session.  She is extremely tired and lying in the bed.  She reported since the last visit things are not going very well physically.  She had extremely high blood sugar and hemoglobin A1c above 12.  Patient told her chemotherapy is discontinued but she may need pump for few days.  She is not going outside unless her daughter or mother takes her.  She is frustrated because she was never diagnosed with diabetes and belief chemotherapy medicine may be causing high blood sugar.  She is now on metformin  and insulin .  She is in therapy with nausea but not consistent because she was in and out from the hospital.  She had a good support from her daughter.  Despite taking Ambien  she struggled with insomnia.  She is compliant with Wellbutrin  and Zoloft .  She feels sometime hopeless but denies any active or passive suicidal thoughts or homicidal thoughts.  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  She still gets some time numb but not worsening.  She denies any paranoia, hallucination.  She is hoping blood sugar go down with the help of insulin  and metformin .  She has no tremor or shakes or any EPS.  Past Psychiatric History: H/O depression.  No h/o inpatient, mania, psychosis or suicidal attempt.  Tried Prozac  which worked but had weight gain.  Tried  Vistaril , trazodone , Thorazine , Lunesta  3 mg, Doxepin  50 mg and saphris  5 mg with poor outcome.  Lamictal  caused rash and temazepam  caused headache.    Past Medical History:  Diagnosis Date   Abnormal laboratory test    Acid reflux    Allergy    Anemia    Anxiety    Arthritis    back   Asthma    Blood transfusion without reported diagnosis    BMI 39.0-39.9,adult    Cancer (HCC)    Chronic hypokalemia    Chronic kidney disease    mild   Depression    GERD (gastroesophageal reflux disease)    H/O: hysterectomy    Heart murmur    Hypertension    Insomnia    Low back pain    Obesity    Occasional tremors    Palpitations    dx with tachycardia    PONV (postoperative nausea and vomiting)    Pre-diabetes    Sacroiliitis    Tachycardia    Tiredness    Urinary frequency     Outpatient Encounter Medications as of 01/01/2025  Medication Sig   acetaminophen  (TYLENOL ) 500 MG tablet Take 2 tablets (1,000 mg total) by mouth every 6 (six) hours. (Patient not taking: Reported on 12/06/2024)   albuterol  (PROVENTIL ) (2.5 MG/3ML) 0.083% nebulizer solution USE 1 VIAL VIA NEBULIZER EVERY 6 HOURS AS NEEDED FOR WHEEZING OR  SHORTNESS OF BREATH (Patient not taking: Reported on 12/06/2024)   blood glucose meter kit and supplies KIT  Use up to four times daily as directed.   buPROPion  (WELLBUTRIN  XL) 300 MG 24 hr tablet Take 1 tablet (300 mg total) by mouth daily.   carvedilol  (COREG ) 25 MG tablet TAKE 1 TABLET BY MOUTH TWICE  DAILY   cetirizine  (ZYRTEC ) 10 MG tablet TAKE 1 TABLET(10 MG) BY MOUTH AT BEDTIME AS NEEDED FOR ALLERGIES   dexamethasone  (DECADRON ) 4 MG tablet Take 1 tablet (4 mg total) by mouth 2 (two) times daily with a meal. Take for 2 days beginning day pump is removed (Patient not taking: Reported on 12/06/2024)   diphenoxylate-atropine (LOMOTIL) 2.5-0.025 MG tablet Take 2 tablets by mouth 4 (four) times daily as needed for diarrhea or loose stools.   famotidine  (PEPCID ) 20 MG tablet  TAKE 1 TABLET(20 MG) BY MOUTH TWICE DAILY   fluticasone  (FLONASE ) 50 MCG/ACT nasal spray SHAKE LIQUID AND USE 2 SPRAYS IN EACH NOSTRIL DAILY   furosemide  (LASIX ) 20 MG tablet Take 20 mg by mouth 2 (two) times daily.    gabapentin  (NEURONTIN ) 300 MG capsule Take 600 mg by mouth 3 (three) times daily.   insulin  glargine (LANTUS ) 100 unit/mL SOPN Inject 10 Units into the skin at bedtime.   Insulin  Pen Needle 32G X 4 MM MISC Use as directed for nightly lantus  injections.   lidocaine -prilocaine  (EMLA ) cream Apply to port site 1-2 hours prior to use   loperamide  (IMODIUM  A-D) 2 MG tablet Take 1 tablet (2 mg total) by mouth 4 (four) times daily as needed for diarrhea or loose stools.   losartan  (COZAAR ) 100 MG tablet Take 100 mg by mouth daily.   magic mouthwash SOLN Take 5 mLs by mouth 4 (four) times daily as needed for mouth pain (swish x 1 minute and spit or swallow). (Patient not taking: Reported on 12/06/2024)   metFORMIN  (GLUCOPHAGE -XR) 500 MG 24 hr tablet Take 1 tablet daily in the morning with breakfast for a week.  Then add a second tablet and take 2 tablets daily in the morning.   ondansetron  (ZOFRAN ) 8 MG tablet Take 1 tablet (8 mg total) by mouth every 8 (eight) hours as needed for nausea or vomiting. Do not start until 72 hours after day 1 chemo   oxybutynin  (DITROPAN -XL) 10 MG 24 hr tablet TAKE 1 TABLET BY MOUTH AT  BEDTIME   oxyCODONE -acetaminophen  (PERCOCET) 10-325 MG tablet Take 1 tablet by mouth every 6 (six) hours as needed. (Patient not taking: Reported on 12/06/2024)   primidone  (MYSOLINE ) 50 MG tablet TAKE 1 TABLET BY MOUTH EVERY MORNING AND 6 TABLETS EVERY EVENING   prochlorperazine  (COMPAZINE ) 10 MG tablet Take 1 tablet (10 mg total) by mouth every 6 (six) hours as needed for nausea or vomiting.   sertraline  (ZOLOFT ) 100 MG tablet Take 1 tablet (100 mg total) by mouth daily.   spironolactone  (ALDACTONE ) 100 MG tablet Take 100 mg by mouth 2 (two) times daily.   SYMBICORT  80-4.5  MCG/ACT inhaler USE 2 INHALATIONS BY MOUTH TWICE DAILY   tiZANidine  (ZANAFLEX ) 4 MG tablet Take 4 mg by mouth 3 (three) times daily as needed for muscle spasms.   VENTOLIN  HFA 108 (90 Base) MCG/ACT inhaler USE 2 INHALATIONS BY MOUTH EVERY 6 HOURS AS NEEDED FOR WHEEZING  OR SHORTNESS OF BREATH   zolpidem  (AMBIEN  CR) 12.5 MG CR tablet Take 1 tablet (12.5 mg total) by mouth at bedtime as needed for sleep.   Facility-Administered Encounter Medications as of 01/01/2025  Medication   dextrose  5 % solution    Recent Results (  from the past 2160 hours)  CMP (Cancer Center only)     Status: Abnormal   Collection Time: 10/09/24  8:37 AM  Result Value Ref Range   Sodium 138 135 - 145 mmol/L   Potassium 3.5 3.5 - 5.1 mmol/L   Chloride 99 98 - 111 mmol/L   CO2 26 22 - 32 mmol/L   Glucose, Bld 146 (H) 70 - 99 mg/dL    Comment: Glucose reference range applies only to samples taken after fasting for at least 8 hours.   BUN 7 6 - 20 mg/dL   Creatinine 9.16 9.55 - 1.00 mg/dL   Calcium  9.8 8.9 - 10.3 mg/dL   Total Protein 7.4 6.5 - 8.1 g/dL   Albumin  4.6 3.5 - 5.0 g/dL   AST 31 15 - 41 U/L    Comment: HEMOLYSIS AT THIS LEVEL MAY AFFECT RESULT   ALT 20 0 - 44 U/L   Alkaline Phosphatase 199 (H) 38 - 126 U/L   Total Bilirubin 0.3 0.0 - 1.2 mg/dL   GFR, Estimated >39 >39 mL/min    Comment: (NOTE) Calculated using the CKD-EPI Creatinine Equation (2021)    Anion gap 13 5 - 15    Comment: Performed at [facility], [address] [place], [place] [address]  CBC with Differential (Cancer Center Only)     Status: Abnormal   Collection Time: 10/09/24  8:37 AM  Result Value Ref Range   WBC Count 12.1 (H) 4.0 - 10.5 K/uL   RBC 3.78 (L) 3.87 - 5.11 MIL/uL   Hemoglobin 12.1 12.0 - 15.0 g/dL   HCT 64.5 (L) 63.9 - 53.9 %   MCV 93.7 80.0 - 100.0 fL   MCH 32.0 26.0 - 34.0 pg   MCHC 34.2 30.0 - 36.0 g/dL   RDW 83.7 (H) 88.4 - 84.4 %   Platelet Count 184 150 - 400 K/uL   nRBC 0.2 0.0 - 0.2  %   Neutrophils Relative % 66 %   Neutro Abs 8.1 (H) 1.7 - 7.7 K/uL   Lymphocytes Relative 19 %   Lymphs Abs 2.3 0.7 - 4.0 K/uL   Monocytes Relative 8 %   Monocytes Absolute 1.0 0.1 - 1.0 K/uL   Eosinophils Relative 2 %   Eosinophils Absolute 0.3 0.0 - 0.5 K/uL   Basophils Relative 1 %   Basophils Absolute 0.1 0.0 - 0.1 K/uL   Immature Granulocytes 4 %   Abs Immature Granulocytes 0.51 (H) 0.00 - 0.07 K/uL    Comment: Performed at [facility], [address] [place], [place] [address]  CEA (Access[facility] ONLY     Status: None   Collection Time: 10/09/24  8:37 AM  Result Value Ref Range   CEA ([facility]) 2.30 0.00 - 5.00 ng/mL    Comment: (NOTE) This test was performed using Beckman Coulter's paramagnetic chemiluminescent immunoassay. Values obtained from different assay methods cannot be used interchangeably. Please note that up to 8% of patients who smoke may see values 5.1-10.0 ng/ml and 1% of patients who smoke may see CEA levels >10.0 ng/ml. Performed at [facility], [address] [place], [place] [address]   CMP (Cancer Center only)     Status: Abnormal   Collection Time: 10/22/24  8:25 AM  Result Value Ref Range   Sodium 136 135 - 145 mmol/L   Potassium 3.9 3.5 - 5.1 mmol/L   Chloride 99 98 - 111 mmol/L   CO2 26 22 - 32 mmol/L   Glucose,  Bld 305 (H) 70 - 99 mg/dL    Comment: Glucose reference range applies only to samples taken after fasting for at least 8 hours.   BUN 7 6 - 20 mg/dL   Creatinine 9.08 9.55 - 1.00 mg/dL   Calcium  9.4 8.9 - 10.3 mg/dL   Total Protein 6.7 6.5 - 8.1 g/dL   Albumin  4.2 3.5 - 5.0 g/dL   AST 34 15 - 41 U/L   ALT 21 0 - 44 U/L   Alkaline Phosphatase 231 (H) 38 - 126 U/L   Total Bilirubin 0.2 0.0 - 1.2 mg/dL   GFR, Estimated >39 >39 mL/min    Comment: (NOTE) Calculated using the CKD-EPI Creatinine Equation (2021)    Anion gap 11 5 - 15    Comment: Performed at [facility], [address], [place], [place] [address]  CBC with Differential (Cancer Center Only)     Status: Abnormal   Collection Time: 10/22/24  8:25 AM  Result Value Ref Range   WBC Count 14.4 (H) 4.0 - 10.5 K/uL   RBC 3.52 (L) 3.87 - 5.11 MIL/uL   Hemoglobin 11.7 (L) 12.0 - 15.0 g/dL   HCT 66.1 (L) 63.9 - 53.9 %   MCV 96.0 80.0 - 100.0 fL   MCH 33.2 26.0 - 34.0 pg   MCHC 34.6 30.0 - 36.0 g/dL   RDW 85.0 88.4 - 84.4 %   Platelet Count 265 150 - 400 K/uL   nRBC 0.2 0.0 - 0.2 %   Neutrophils Relative % 62 %   Neutro Abs 9.1 (H) 1.7 - 7.7 K/uL   Lymphocytes Relative 21 %   Lymphs Abs 3.0 0.7 - 4.0 K/uL   Monocytes Relative 10 %   Monocytes Absolute 1.5 (H) 0.1 - 1.0 K/uL   Eosinophils Relative 2 %   Eosinophils Absolute 0.3 0.0 - 0.5 K/uL   Basophils Relative 1 %   Basophils Absolute 0.1 0.0 - 0.1 K/uL   Immature Granulocytes 4 %   Abs Immature Granulocytes 0.54 (H) 0.00 - 0.07 K/uL    Comment: Performed at [facility], [address], [place], [place] [address]  [facility]A (Access[facility] ONLY     Status: None   Collection Time: 10/22/24  8:25 AM  Result Value Ref Range   CEA ([facility]) 2.40 0.00 - 5.00 ng/mL    Comment: (NOTE) This test was performed using Beckman Coulter's paramagnetic chemiluminescent immunoassay. Values obtained from different assay methods cannot be used interchangeably. Please note that up to 8% of patients who smoke may see values 5.1-10.0 ng/ml and 1% of patients who smoke may see CEA levels >10.0 ng/ml. Performed at [facility], [address], [place], [place] [address]   CMP (Cancer Center only)     Status: Abnormal   Collection Time: 11/05/24 10:00 AM  Result Value Ref Range   Sodium 136 135 - 145 mmol/L   Potassium 3.4 (L) 3.5 - 5.1 mmol/L   Chloride 95 (L) 98 - 111 mmol/L   CO2 27 22 - 32 mmol/L   Glucose, Bld 292 (H) 70 - 99 mg/dL    Comment: Glucose reference range applies only to samples taken after fasting for at  least 8 hours.   BUN 8 6 - 20 mg/dL   Creatinine 9.04 9.55 - 1.00 mg/dL   Calcium  10.1 8.9 - 10.3 mg/dL   Total Protein 7.3 6.5 - 8.1 g/dL   Albumin  4.6 3.5 - 5.0 g/dL   AST 33 15 -  41 U/L   ALT 29 0 - 44 U/L   Alkaline Phosphatase 280 (H) 38 - 126 U/L   Total Bilirubin 0.4 0.0 - 1.2 mg/dL   GFR, Estimated >39 >39 mL/min    Comment: (NOTE) Calculated using the CKD-EPI Creatinine Equation (2021)    Anion gap 13 5 - 15    Comment: Performed at [facility], [address] [place], [place] [address]  CBC with Differential (Cancer Center Only)     Status: Abnormal   Collection Time: 11/05/24 10:00 AM  Result Value Ref Range   WBC Count 15.9 (H) 4.0 - 10.5 K/uL   RBC 3.80 (L) 3.87 - 5.11 MIL/uL   Hemoglobin 12.2 12.0 - 15.0 g/dL   HCT 64.0 (L) 63.9 - 53.9 %   MCV 94.5 80.0 - 100.0 fL   MCH 32.1 26.0 - 34.0 pg   MCHC 34.0 30.0 - 36.0 g/dL   RDW 85.5 88.4 - 84.4 %   Platelet Count 206 150 - 400 K/uL   nRBC 0.4 (H) 0.0 - 0.2 %   Neutrophils Relative % 63 %   Neutro Abs 10.0 (H) 1.7 - 7.7 K/uL   Lymphocytes Relative 17 %   Lymphs Abs 2.7 0.7 - 4.0 K/uL   Monocytes Relative 9 %   Monocytes Absolute 1.4 (H) 0.1 - 1.0 K/uL   Eosinophils Relative 1 %   Eosinophils Absolute 0.1 0.0 - 0.5 K/uL   Basophils Relative 1 %   Basophils Absolute 0.2 (H) 0.0 - 0.1 K/uL   Immature Granulocytes 9 %   Abs Immature Granulocytes 1.49 (H) 0.00 - 0.07 K/uL    Comment: Performed at [facility], [address], [place], [place] [address]  CEA (Access)     Status: None   Collection Time: 11/05/24 10:00 AM  Result Value Ref Range   CEA ([facility]) 3.39 0.00 - 5.00 ng/mL    Comment: (NOTE) This test was performed using Beckman Coulter's paramagnetic chemiluminescent immunoassay. Values obtained from different assay methods cannot be used interchangeably. Please note that up to 8% of patients who smoke may see values 5.1-10.0 ng/ml and 1% of patients who smoke may  see CEA levels >10.0 ng/ml. Performed at [facility], [address] [place], [place] [address]   CMP (Cancer Center only)     Status: Abnormal   Collection Time: 12/06/24 11:20 AM  Result Value Ref Range   Sodium 133 (L) 135 - 145 mmol/L   Potassium 3.9 3.5 - 5.1 mmol/L   Chloride 94 (L) 98 - 111 mmol/L   CO2 27 22 - 32 mmol/L   Glucose, Bld 404 (H) 70 - 99 mg/dL    Comment: Glucose reference range applies only to samples taken after fasting for at least 8 hours.   BUN 11 6 - 20 mg/dL   Creatinine 8.91 (H) 9.55 - 1.00 mg/dL   Calcium  10.1 8.9 - 10.3 mg/dL   Total Protein 7.5 6.5 - 8.1 g/dL   Albumin  4.5 3.5 - 5.0 g/dL   AST 22 15 - 41 U/L   ALT 14 0 - 44 U/L   Alkaline Phosphatase 196 (H) 38 - 126 U/L   Total Bilirubin 0.4 0.0 - 1.2 mg/dL   GFR, Estimated >39 >39 mL/min    Comment: (NOTE) Calculated using the CKD-EPI Creatinine Equation (2021)    Anion gap 13 5 - 15    Comment: Performed at [facility], [address] [place], [place] [address]  CBC with Differential (Cancer Center Only)     Status: None   Collection Time: 12/06/24 11:20 AM  Result Value Ref Range   WBC Count 8.5 4.0 - 10.5 K/uL   RBC 4.14 3.87 - 5.11 MIL/uL   Hemoglobin 13.0 12.0 - 15.0 g/dL   HCT 62.4 63.9 - 53.9 %   MCV 90.6 80.0 - 100.0 fL   MCH 31.4 26.0 - 34.0 pg   MCHC 34.7 30.0 - 36.0 g/dL   RDW 87.6 88.4 - 84.4 %   Platelet Count 268 150 - 400 K/uL   nRBC 0.0 0.0 - 0.2 %   Neutrophils Relative % 59 %   Neutro Abs 5.0 1.7 - 7.7 K/uL   Lymphocytes Relative 27 %   Lymphs Abs 2.3 0.7 - 4.0 K/uL   Monocytes Relative 10 %   Monocytes Absolute 0.8 0.1 - 1.0 K/uL   Eosinophils Relative 3 %   Eosinophils Absolute 0.3 0.0 - 0.5 K/uL   Basophils Relative 1 %   Basophils Absolute 0.1 0.0 - 0.1 K/uL   Immature Granulocytes 0 %   Abs Immature Granulocytes 0.02 0.00 - 0.07 K/uL    Comment: Performed at [facility], [address],  [place], [place] [address]  CEA (Access[facility] ONLY     Status: None   Collection Time: 12/06/24 11:20 AM  Result Value Ref Range   CEA ([facility]) 3.48 0.00 - 5.00 ng/mL    Comment: (NOTE) This test was performed using Beckman Coulter's paramagnetic chemiluminescent immunoassay. Values obtained from different assay methods cannot be used interchangeably. Please note that up to 8% of patients who smoke may see values 5.1-10.0 ng/ml and 1% of patients who smoke may see CEA levels >10.0 ng/ml. Performed at [facility], [address], [place], [place] [address]   Comprehensive metabolic panel     Status: Abnormal   Collection Time: 12/17/24  8:40 AM  Result Value Ref Range   Sodium 132 (L) 135 - 145 mmol/L   Potassium 4.1 3.5 - 5.1 mmol/L   Chloride 92 (L) 98 - 111 mmol/L   CO2 28 22 - 32 mmol/L   Glucose, Bld 799 (HH) 70 - 99 mg/dL    Comment: Critical Value, Read Back and verified with constance [email or web address] and jj @ 0935 on [id number] Glucose reference range applies only to samples taken after fasting for at least 8 hours.    BUN 10 6 - 20 mg/dL   Creatinine, Ser 8.89 (H) 0.44 - 1.00 mg/dL   Calcium  10.8 (H) 8.9 - 10.3 mg/dL   Total Protein 7.7 6.5 - 8.1 g/dL   Albumin  4.8 3.5 - 5.0 g/dL   AST 19 15 - 41 U/L   ALT 15 0 - 44 U/L   Alkaline Phosphatase 185 (H) 38 - 126 U/L   Total Bilirubin 0.4 0.0 - 1.2 mg/dL   GFR, Estimated 59 (L) >60 mL/min    Comment: (NOTE) Calculated using the CKD-EPI Creatinine Equation (2021)    Anion gap 12 5 - 15    Comment: Performed at [facility], [address] [place], [place] [address]  CBC with Differential     Status: None   Collection Time: 12/17/24  8:40 AM  Result Value Ref Range   WBC 6.4 4.0 - 10.5 K/uL   RBC 4.21 3.87 - 5.11 MIL/uL   Hemoglobin 13.1 12.0 - 15.0 g/dL   HCT 62.2 63.9 - 53.9 %   MCV 89.5 80.0 - 100.0 fL  MCH 31.1 26.0 - 34.0 pg   MCHC 34.7 30.0 - 36.0 g/dL   RDW 87.3 88.4 - 84.4 %    Platelets 299 150 - 400 K/uL   nRBC 0.0 0.0 - 0.2 %   Neutrophils Relative % 49 %   Neutro Abs 3.2 1.7 - 7.7 K/uL   Lymphocytes Relative 35 %   Lymphs Abs 2.2 0.7 - 4.0 K/uL   Monocytes Relative 12 %   Monocytes Absolute 0.8 0.1 - 1.0 K/uL   Eosinophils Relative 3 %   Eosinophils Absolute 0.2 0.0 - 0.5 K/uL   Basophils Relative 1 %   Basophils Absolute 0.1 0.0 - 0.1 K/uL   Immature Granulocytes 0 %   Abs Immature Granulocytes 0.01 0.00 - 0.07 K/uL    Comment: Performed at [facility], [address] [place], [place] [address]  Basic metabolic panel     Status: Abnormal   Collection Time: 12/17/24 11:32 AM  Result Value Ref Range   Sodium 128 (L) 135 - 145 mmol/L   Potassium 4.1 3.5 - 5.1 mmol/L   Chloride 90 (L) 98 - 111 mmol/L   CO2 25 22 - 32 mmol/L   Glucose, Bld 777 (HH) 70 - 99 mg/dL    Comment: Critical Value, Read Back and verified with rn j pegram at 1250 12/17/24 ac Glucose reference range applies only to samples taken after fasting for at least 8 hours.    BUN 10 6 - 20 mg/dL   Creatinine, Ser 8.92 (H) 0.44 - 1.00 mg/dL   Calcium  10.2 8.9 - 10.3 mg/dL   GFR, Estimated >39 >39 mL/min    Comment: (NOTE) Calculated using the CKD-EPI Creatinine Equation (2021)    Anion gap 12 5 - 15    Comment: Performed at [facility], [address] [place], [place] [address]  Beta-hydroxybutyric acid     Status: Abnormal   Collection Time: 12/17/24 11:32 AM  Result Value Ref Range   Beta-Hydroxybutyric Acid 0.67 (H) 0.05 - 0.27 mmol/L    Comment: (NOTE) This is a modified FDA-approved test that has been validated and its performance characteristics determined by the reporting laboratory. This laboratory is certified under the Clinical Laboratory Improvement Amendments (CLIA) as qualified to perform high complexity clinical laboratory testing.  Performed at [facility], [address] [place], [place] [address]   CBC with  Differential (PN[facility])     Status: Abnormal   Collection Time: 12/17/24 11:32 AM  Result Value Ref Range   WBC 5.9 4.0 - 10.5 K/uL   RBC 3.95 3.87 - 5.11 MIL/uL   Hemoglobin 12.2 12.0 - 15.0 g/dL   HCT 64.5 (L) 63.9 - 53.9 %   MCV 89.6 80.0 - 100.0 fL   MCH 30.9 26.0 - 34.0 pg   MCHC 34.5 30.0 - 36.0 g/dL   RDW 87.4 88.4 - 84.4 %   Platelets 271 150 - 400 K/uL   nRBC 0.0 0.0 - 0.2 %   Neutrophils Relative % 61 %   Neutro Abs 3.6 1.7 - 7.7 K/uL   Lymphocytes Relative 26 %   Lymphs Abs 1.5 0.7 - 4.0 K/uL   Monocytes Relative 10 %   Monocytes Absolute 0.6 0.1 - 1.0 K/uL   Eosinophils Relative 2 %   Eosinophils Absolute 0.1 0.0 - 0.5 K/uL   Basophils Relative 1 %   Basophils Absolute 0.0 0.0 - 0.1 K/uL   Immature Granulocytes 0 %   Abs Immature Granulocytes 0.02  0.00 - 0.07 K/uL    Comment: Performed at [facility], [address], [place], [place] [address]  Hemoglobin A1c     Status: Abnormal   Collection Time: 12/17/24 11:32 AM  Result Value Ref Range   Hgb A1c MFr Bld 12.0 (H) 4.8 - 5.6 %    Comment: (NOTE) Diagnosis of Diabetes The following HbA1c ranges recommended by the American Diabetes Association (ADA) may be used as an aid in the diagnosis of diabetes mellitus.  Hemoglobin             Suggested A1C NGSP%              Diagnosis  <5.7                   Non Diabetic  5.7-6.4                Pre-Diabetic  >6.4                   Diabetic  <7.0                   Glycemic control for                       adults with diabetes.     Mean Plasma Glucose 297.7 mg/dL    Comment: Performed at [facility], [address] [place], [place] [address]  I-Stat venous blood gas, ED     Status: Abnormal   Collection Time: 12/17/24 11:36 AM  Result Value Ref Range   pH, Ven 7.394 7.25 - 7.43   pCO2, Ven 43.6 (L) 44 - 60 mmHg   pO2, Ven 31 (LL) 32 - 45 mmHg   Bicarbonate 26.6 20.0 - 28.0 mmol/L   TCO2 28 22 - 32 mmol/L   O2 Saturation 58 %    Acid-Base Excess 1.0 0.0 - 2.0 mmol/L   Sodium 126 (L) 135 - 145 mmol/L   Potassium 4.0 3.5 - 5.1 mmol/L   Calcium , Ion 1.11 (L) 1.15 - 1.40 mmol/L   HCT 38.0 36.0 - 46.0 %   Hemoglobin 12.9 12.0 - 15.0 g/dL   Patient temperature 01.3 F    Collection site IV start    Drawn by Nurse    Sample type VENOUS    Comment VALUES EXPECTED, NO REPEAT   Urinalysis, Routine w reflex microscopic -Urine, Clean Catch     Status: Abnormal   Collection Time: 12/17/24 12:24 PM  Result Value Ref Range   Color, Urine COLORLESS (A) YELLOW   APPearance CLEAR CLEAR   Specific Gravity, Urine 1.033 (H) 1.005 - 1.030   pH 5.5 5.0 - 8.0   Glucose, UA >1,000 (A) NEGATIVE mg/dL   Hgb urine dipstick NEGATIVE NEGATIVE   Bilirubin Urine NEGATIVE NEGATIVE   Ketones, ur NEGATIVE NEGATIVE mg/dL   Protein, ur NEGATIVE NEGATIVE mg/dL   Nitrite NEGATIVE NEGATIVE   Leukocytes,Ua NEGATIVE NEGATIVE   RBC / HPF 0-5 0 - 5 RBC/hpf   WBC, UA 0-5 0 - 5 WBC/hpf   Bacteria, UA NONE SEEN NONE SEEN   Squamous Epithelial / HPF 0-5 0 - 5 /HPF    Comment: Performed at [facility], [address] [place], [place] [address]  Basic metabolic panel     Status: Abnormal   Collection Time: 12/17/24  3:16 PM  Result Value Ref Range   Sodium 135 135 - 145 mmol/L   Potassium  3.7 3.5 - 5.1 mmol/L   Chloride 97 (L) 98 - 111 mmol/L   CO2 28 22 - 32 mmol/L   Glucose, Bld 432 (H) 70 - 99 mg/dL    Comment: Glucose reference range applies only to samples taken after fasting for at least 8 hours.   BUN 8 6 - 20 mg/dL   Creatinine, Ser 9.13 0.44 - 1.00 mg/dL   Calcium  10.0 8.9 - 10.3 mg/dL   GFR, Estimated >39 >39 mL/min    Comment: (NOTE) Calculated using the CKD-EPI Creatinine Equation (2021)    Anion gap 10 5 - 15    Comment: Performed at [facility], [address] [place], [place] [address]  Beta-hydroxybutyric acid     Status: Abnormal   Collection Time: 12/17/24  3:16 PM  Result Value  Ref Range   Beta-Hydroxybutyric Acid 0.45 (H) 0.05 - 0.27 mmol/L    Comment: (NOTE) This is a modified FDA-approved test that has been validated and its performance characteristics determined by the reporting laboratory. This laboratory is certified under the Clinical Laboratory Improvement Amendments (CLIA) as qualified to perform high complexity clinical laboratory testing.  Performed at [facility], [address], [place], [place] [address]   CBG monitoring, ED     Status: Abnormal   Collection Time: 12/17/24  5:13 PM  Result Value Ref Range   Glucose-Capillary 383 (H) 70 - 99 mg/dL    Comment: Glucose reference range applies only to samples taken after fasting for at least 8 hours.  CMP (Cancer Center only)     Status: Abnormal   Collection Time: 12/26/24  9:14 AM  Result Value Ref Range   Sodium 126 (L) 135 - 145 mmol/L   Potassium 4.5 3.5 - 5.1 mmol/L   Chloride 89 (L) 98 - 111 mmol/L   CO2 23 22 - 32 mmol/L   Glucose, Bld 609 (HH) 70 - 99 mg/dL    Comment: Critical Value, Read Back and verified with rn e wood at 1020 12/26/24 ac Glucose reference range applies only to samples taken after fasting for at least 8 hours.    BUN 15 6 - 20 mg/dL   Creatinine 8.98 (H) 9.55 - 1.00 mg/dL   Calcium  10.6 (H) 8.9 - 10.3 mg/dL   Total Protein 7.3 6.5 - 8.1 g/dL   Albumin  4.3 3.5 - 5.0 g/dL   AST 16 15 - 41 U/L    Comment: HEMOLYSIS AT THIS LEVEL MAY AFFECT RESULT Hemolysis at this level may affect result     ALT 8 0 - 44 U/L   Alkaline Phosphatase 172 (H) 38 - 126 U/L   Total Bilirubin 0.4 0.0 - 1.2 mg/dL   GFR, Estimated >39 >39 mL/min    Comment: (NOTE) Calculated using the CKD-EPI Creatinine Equation (2021)    Anion gap 14 5 - 15    Comment: Performed at [facility], [address], [place], [place] [address]  CBC with Differential (Cancer Center Only)     Status: None   Collection Time: 12/26/24  9:14 AM  Result Value Ref Range   WBC Count  6.7 4.0 - 10.5 K/uL   RBC 4.26 3.87 - 5.11 MIL/uL   Hemoglobin 13.1 12.0 - 15.0 g/dL   HCT 62.1 63.9 - 53.9 %   MCV 88.7 80.0 - 100.0 fL   MCH 30.8 26.0 - 34.0 pg   MCHC 34.7 30.0 - 36.0 g/dL   RDW 87.0 88.4 - 84.4 %  Platelet Count 304 150 - 400 K/uL   nRBC 0.0 0.0 - 0.2 %   Neutrophils Relative % 51 %   Neutro Abs 3.4 1.7 - 7.7 K/uL   Lymphocytes Relative 32 %   Lymphs Abs 2.1 0.7 - 4.0 K/uL   Monocytes Relative 12 %   Monocytes Absolute 0.8 0.1 - 1.0 K/uL   Eosinophils Relative 4 %   Eosinophils Absolute 0.3 0.0 - 0.5 K/uL   Basophils Relative 1 %   Basophils Absolute 0.1 0.0 - 0.1 K/uL   Immature Granulocytes 0 %   Abs Immature Granulocytes 0.01 0.00 - 0.07 K/uL    Comment: Performed at [facility], [address], [place], [place] [address]     Psychiatric Specialty Exam: Physical Exam  Review of Systems  Constitutional:  Positive for fatigue.  Neurological:  Positive for numbness.    Weight 241 lb (109.3 kg), last menstrual period 09/17/2017.There is no height or weight on file to calculate BMI.  General Appearance: Casual  Eye Contact:  Fair  Speech:  Slow  Volume:  Decreased  Mood:  tired  Affect:  Appropriate  Thought Process:  Goal Directed  Orientation:  Full (Time, Place, and Person)  Thought Content:  Logical  Suicidal Thoughts:  No  Homicidal Thoughts:  No  Memory:  Immediate;   Good Recent;   Good Remote;   Fair  Judgement:  Intact  Insight:  Present  Psychomotor Activity:  Decreased  Concentration:  Concentration: Fair and Attention Span: Fair  Recall:  Fair  Fund of Knowledge:  Good  Language:  Good  Akathisia:  No  Handed:  Right  AIMS (if indicated):     Assets:  Communication Skills Desire for Improvement Housing Resilience Social Support  ADL's:  Intact  Cognition:  WNL  Sleep:  ok with Ambien         12/17/2024    8:00 AM 12/14/2024    3:24 PM 12/06/2024   11:37 AM 11/05/2024   10:00 AM 10/22/2024     8:45 AM  Depression screen PHQ 2/9  Decreased Interest 0 1 1 1 1   Down, Depressed, Hopeless 1 1 1 1 1   PHQ - 2 Score 1 2 2 2 2   Altered sleeping   0 1 0  Tired, decreased energy   1 1 1   Change in appetite   0 0   Feeling bad or failure about yourself    0 1 0  Trouble concentrating   0 1 0  Moving slowly or fidgety/restless   0 1 0  Suicidal thoughts   0 0 0  PHQ-9 Score   3 7 3       Data saved with a previous flowsheet row definition    Assessment/Plan: MDD (major depressive disorder), recurrent episode, mild - Plan: buPROPion  (WELLBUTRIN  XL) 300 MG 24 hr tablet, sertraline  (ZOLOFT ) 100 MG tablet  Primary insomnia - Plan: zolpidem  (AMBIEN  CR) 12.5 MG CR tablet  GAD (generalized anxiety disorder) - Plan: sertraline  (ZOLOFT ) 100 MG tablet  Patient is a 54 year old female with colon cancer status postchemotherapy and now diagnosed with hyperglycemia.  Reviewed blood work results and collateral information.  Her blood sugar was above 777 and hemoglobin A1c above 12.  She is now on insulin  and metformin .  Discussed anxiety and insomnia.  In the past she had tried multiple medication but they did not work.  She is taking Ambien  CR 12.5 which helps some of the sleep.  Discussed insomnia could  be situational as patient is having a lot of other health issues.  I offered other medication but due to prior treatment failure with the medication and concern of the weight gain with antipsychotic medication, we discussed to keep the Ambien  CR 12.5 for now and if her sleep remains an issue after a better control of blood sugar we will consider switching to a different medication.  She agreed with the plan.  Will continue Zoloft  100 mg daily and Wellbutrin  XL 300 mg daily.  She is seeing a therapist at cancer center.  Recommend to call back if she is any question or any concern.  Follow-up in 3 months.  Follow Up Instructions:     I discussed the assessment and treatment plan with the patient. The  patient was provided an opportunity to ask questions and all were answered. The patient agreed with the plan and demonstrated an understanding of the instructions.   The patient was advised to call back or seek an in-person evaluation if the symptoms worsen or if the condition fails to improve as anticipated.    Collaboration of Care: Other provider involved in patient's care AEB notes are available in epic to review  Patient/Guardian was advised Release of Information must be obtained prior to any record release in order to collaborate their care with an outside provider. Patient/Guardian was advised if they have not already done so to contact the registration department to sign all necessary forms in order for us  to release information regarding their care.   Consent: Patient/Guardian gives verbal consent for treatment and assignment of benefits for services provided during this visit. Patient/Guardian expressed understanding and agreed to proceed.     Total encounter time 28 minutes which includes face-to-face time, chart reviewed, care coordination, order entry and documentation during this encounter.   Note: This document was prepared by [employer] voice dictation technology and any errors that results from this process are unintentional.    Leni ONEIDA Client, MD 01/01/2025   "

## 2025-01-02 ENCOUNTER — Inpatient Hospital Stay

## 2025-01-02 ENCOUNTER — Other Ambulatory Visit: Payer: Self-pay

## 2025-01-02 ENCOUNTER — Other Ambulatory Visit (HOSPITAL_BASED_OUTPATIENT_CLINIC_OR_DEPARTMENT_OTHER): Payer: Self-pay

## 2025-01-05 ENCOUNTER — Ambulatory Visit (HOSPITAL_BASED_OUTPATIENT_CLINIC_OR_DEPARTMENT_OTHER): Admission: RE | Admit: 2025-01-05 | Discharge: 2025-01-05 | Attending: Oncology | Admitting: Oncology

## 2025-01-05 ENCOUNTER — Other Ambulatory Visit: Payer: Self-pay | Admitting: Oncology

## 2025-01-05 DIAGNOSIS — C182 Malignant neoplasm of ascending colon: Secondary | ICD-10-CM | POA: Diagnosis present

## 2025-01-05 MED ORDER — GADOBUTROL 1 MMOL/ML IV SOLN
10.0000 mL | Freq: Once | INTRAVENOUS | Status: AC | PRN
  Administered 2025-01-05: 10 mL via INTRAVENOUS
  Filled 2025-01-05: qty 10

## 2025-01-07 ENCOUNTER — Ambulatory Visit: Payer: Self-pay | Admitting: Oncology

## 2025-01-08 ENCOUNTER — Other Ambulatory Visit: Payer: Self-pay | Admitting: Nurse Practitioner

## 2025-01-08 DIAGNOSIS — C182 Malignant neoplasm of ascending colon: Secondary | ICD-10-CM

## 2025-01-09 ENCOUNTER — Inpatient Hospital Stay

## 2025-01-09 ENCOUNTER — Other Ambulatory Visit: Payer: Self-pay | Admitting: *Deleted

## 2025-01-09 ENCOUNTER — Inpatient Hospital Stay: Attending: Oncology

## 2025-01-09 ENCOUNTER — Telehealth: Payer: Self-pay

## 2025-01-09 ENCOUNTER — Telehealth: Payer: Self-pay | Admitting: Genetic Counselor

## 2025-01-09 ENCOUNTER — Inpatient Hospital Stay: Admitting: Oncology

## 2025-01-09 ENCOUNTER — Other Ambulatory Visit (HOSPITAL_BASED_OUTPATIENT_CLINIC_OR_DEPARTMENT_OTHER): Payer: Self-pay

## 2025-01-09 ENCOUNTER — Telehealth: Payer: Self-pay | Admitting: *Deleted

## 2025-01-09 ENCOUNTER — Other Ambulatory Visit: Payer: Self-pay | Admitting: Oncology

## 2025-01-09 VITALS — BP 98/65 | HR 70 | Temp 97.8°F | Resp 18 | Ht 66.0 in | Wt 240.5 lb

## 2025-01-09 DIAGNOSIS — G620 Drug-induced polyneuropathy: Secondary | ICD-10-CM | POA: Diagnosis not present

## 2025-01-09 DIAGNOSIS — Z5189 Encounter for other specified aftercare: Secondary | ICD-10-CM | POA: Insufficient documentation

## 2025-01-09 DIAGNOSIS — Z5111 Encounter for antineoplastic chemotherapy: Secondary | ICD-10-CM | POA: Insufficient documentation

## 2025-01-09 DIAGNOSIS — I10 Essential (primary) hypertension: Secondary | ICD-10-CM | POA: Diagnosis not present

## 2025-01-09 DIAGNOSIS — C182 Malignant neoplasm of ascending colon: Secondary | ICD-10-CM

## 2025-01-09 DIAGNOSIS — Z803 Family history of malignant neoplasm of breast: Secondary | ICD-10-CM | POA: Insufficient documentation

## 2025-01-09 DIAGNOSIS — Z800 Family history of malignant neoplasm of digestive organs: Secondary | ICD-10-CM | POA: Insufficient documentation

## 2025-01-09 DIAGNOSIS — R918 Other nonspecific abnormal finding of lung field: Secondary | ICD-10-CM | POA: Diagnosis not present

## 2025-01-09 DIAGNOSIS — C787 Secondary malignant neoplasm of liver and intrahepatic bile duct: Secondary | ICD-10-CM | POA: Diagnosis not present

## 2025-01-09 DIAGNOSIS — R350 Frequency of micturition: Secondary | ICD-10-CM | POA: Insufficient documentation

## 2025-01-09 DIAGNOSIS — C180 Malignant neoplasm of cecum: Secondary | ICD-10-CM | POA: Diagnosis not present

## 2025-01-09 LAB — CBC WITH DIFFERENTIAL (CANCER CENTER ONLY)
Abs Immature Granulocytes: 0.02 K/uL (ref 0.00–0.07)
Basophils Absolute: 0.1 K/uL (ref 0.0–0.1)
Basophils Relative: 1 %
Eosinophils Absolute: 0.3 K/uL (ref 0.0–0.5)
Eosinophils Relative: 4 %
HCT: 36.2 % (ref 36.0–46.0)
Hemoglobin: 12.5 g/dL (ref 12.0–15.0)
Immature Granulocytes: 0 %
Lymphocytes Relative: 29 %
Lymphs Abs: 2.1 K/uL (ref 0.7–4.0)
MCH: 30.8 pg (ref 26.0–34.0)
MCHC: 34.5 g/dL (ref 30.0–36.0)
MCV: 89.2 fL (ref 80.0–100.0)
Monocytes Absolute: 0.7 K/uL (ref 0.1–1.0)
Monocytes Relative: 10 %
Neutro Abs: 4 K/uL (ref 1.7–7.7)
Neutrophils Relative %: 56 %
Platelet Count: 334 K/uL (ref 150–400)
RBC: 4.06 MIL/uL (ref 3.87–5.11)
RDW: 12.3 % (ref 11.5–15.5)
WBC Count: 7.3 K/uL (ref 4.0–10.5)
nRBC: 0 % (ref 0.0–0.2)

## 2025-01-09 LAB — CMP (CANCER CENTER ONLY)
ALT: 13 U/L (ref 0–44)
AST: 16 U/L (ref 15–41)
Albumin: 4.1 g/dL (ref 3.5–5.0)
Alkaline Phosphatase: 154 U/L — ABNORMAL HIGH (ref 38–126)
Anion gap: 12 (ref 5–15)
BUN: 11 mg/dL (ref 6–20)
CO2: 26 mmol/L (ref 22–32)
Calcium: 10.2 mg/dL (ref 8.9–10.3)
Chloride: 91 mmol/L — ABNORMAL LOW (ref 98–111)
Creatinine: 0.99 mg/dL (ref 0.44–1.00)
GFR, Estimated: >60 mL/min
Glucose, Bld: 503 mg/dL (ref 70–99)
Potassium: 4.4 mmol/L (ref 3.5–5.1)
Sodium: 129 mmol/L — ABNORMAL LOW (ref 135–145)
Total Bilirubin: 0.3 mg/dL (ref 0.0–1.2)
Total Protein: 6.9 g/dL (ref 6.5–8.1)

## 2025-01-09 LAB — CEA (ACCESS): CEA (CHCC): 5.46 ng/mL — ABNORMAL HIGH (ref 0.00–5.00)

## 2025-01-09 NOTE — Telephone Encounter (Signed)
 done

## 2025-01-09 NOTE — Progress Notes (Signed)
 Patient seen by Dr. Arley Hof today  Vitals are within treatment parameters:Yes  Decreased 4 pounds  Labs are within treatment parameters: Yes  Glucose- 503 Treatment plan has been signed: No   Per physician team, Patient will not be receiving treatment today.

## 2025-01-09 NOTE — Progress Notes (Signed)
 DISCONTINUE ON PATHWAY REGIMEN - Colorectal     A cycle is every 14 days:     Oxaliplatin       Leucovorin       Fluorouracil       Fluorouracil    **Always confirm dose/schedule in your pharmacy ordering system**  PRIOR TREATMENT: COS67: mFOLFOX6 q14 Days x 6 Months  START ON PATHWAY REGIMEN - Colorectal     A cycle is every 14 days:     Bevacizumab-xxxx      Irinotecan      Leucovorin       Fluorouracil       Fluorouracil    **Always confirm dose/schedule in your pharmacy ordering system**  Patient Characteristics: Distant Metastases, Nonsurgical Candidate, Non-KRAS G12C, RAS Mutation Positive/Unknown (BRAF V600 Wild-Type/Unknown), Standard Cytotoxic Therapy, Second Line Standard Cytotoxic Therapy, Bevacizumab Eligible Therapeutic Status: Distant Metastases Tumor Location: Colon BRAF Mutation Status: Wild-Type (no mutation) KRAS/NRAS Mutation Status: Non-KRAS G12C, RAS Mutation Positive Microsatellite/Mismatch Repair Status: MSS/pMMR Preferred Therapy Approach: Standard Cytotoxic Therapy Standard Cytotoxic Line of Therapy: Second Line Standard Cytotoxic Therapy Bevacizumab Eligibility: Eligible Intent of Therapy: Non-Curative / Palliative Intent, Discussed with Patient

## 2025-01-09 NOTE — Patient Instructions (Signed)

## 2025-01-09 NOTE — Telephone Encounter (Signed)
 Molecular Testing Review Molecular test report from [facility] reviewed by dentist.   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   Please discuss with patient and refer to genetic counseling if interested.   Burnard Ogren, MS, [facility] Licensed, [email or web address] phone: [phone]

## 2025-01-09 NOTE — Telephone Encounter (Signed)
 Attempted to reach patients PCP at [facility] in [place], spoke with Joe medical assistant in regards to speaking with a nurse to discuss PCP or diabetes team increasing Lantus  and or Metformin  due to patients CBG continuously being elevated, 503 today per provider, patient is non compliant in checking CBG.Stated that the nurse is at another location and a provider is not available to speak at the moment to discuss. Message sent over to nurse to return phone call. Alternate phone number [phone] to reach [facility]. Will make provider aware.

## 2025-01-09 NOTE — Progress Notes (Signed)
 " [facility] OFFICE PROGRESS NOTE   Diagnosis: Colon cancer  INTERVAL HISTORY:   Ms. Goodloe turns as scheduled.  She was last treated with 5-fluorouracil  12/06/2024.  Treatment has remained on hold due to marked hyperglycemia.  She reports she has seen her primary provider and has started metformin  and insulin .  She is not checking her blood sugar at home.  She plans to obtain a continuous glucose monitor today.  She feels tired.  Objective:  Vital signs in last 24 hours:  Blood pressure 98/65, pulse 70, temperature 97.8 F (36.6 C), temperature source Temporal, resp. rate 18, height 5' 6 (1.676 m), weight 240 lb 8 oz (109.1 kg), last menstrual period 09/17/2017, SpO2 95%.    HEENT: No thrush, slight soft fullness in the left supraclavicular fossa Lymphatics: No cervical, supraclavicular, axillary, or inguinal nodes Resp: Lungs clear bilaterally Cardio: Regular rate and rhythm GI: No hepatosplenomegaly, nontender Vascular: No leg edema   Portacath/PICC-without erythema  Lab Results:  Lab Results  Component Value Date   WBC 7.3 01/09/2025   HGB 12.5 01/09/2025   HCT 36.2 01/09/2025   MCV 89.2 01/09/2025   PLT 334 01/09/2025   NEUTROABS 4.0 01/09/2025    CMP  Lab Results  Component Value Date   NA 126 (L) 12/26/2024   K 4.5 12/26/2024   CL 89 (L) 12/26/2024   CO2 23 12/26/2024   GLUCOSE 609 (HH) 12/26/2024   BUN 15 12/26/2024   CREATININE 1.01 (H) 12/26/2024   CALCIUM  10.6 (H) 12/26/2024   PROT 7.3 12/26/2024   ALBUMIN  4.3 12/26/2024   AST 16 12/26/2024   ALT 8 12/26/2024   ALKPHOS 172 (H) 12/26/2024   BILITOT 0.4 12/26/2024   GFRNONAA >60 12/26/2024   GFRAA >60 07/19/2018    Lab Results  Component Value Date   CEA 5.46 (H) 01/09/2025    No results found for: INR, LABPROT  Imaging:  MR LIVER W WO CONTRAST Result Date: 01/06/2025 CLINICAL DATA:  Colon cancer restaging, follow-up liver metastases. EXAM: MRI ABDOMEN WITHOUT AND  WITH CONTRAST TECHNIQUE: Multiplanar multisequence MR imaging of the abdomen was performed both before and after the administration of intravenous contrast. CONTRAST:  10 mL Gadavist  COMPARISON:  09/21/2024 FINDINGS: Lower chest: No acute abnormality. Hepatobiliary: Multiple (approximately 10) new and enlarged rim enhancing, diffusion restricting liver metastases, largest in the posterior right lobe of the liver, hepatic segment VI measuring 1.8 x 1.6 cm (series 14, image 39). Hepatomegaly, maximum coronal span 21.3 cm. No gallstones, gallbladder wall thickening, or biliary dilatation. Pancreas: Unremarkable. No pancreatic ductal dilatation or surrounding inflammatory changes. Spleen: Normal in size without significant abnormality. Adrenals/Urinary Tract: Small benign right adrenal nodules, presumed adenomata. Kidneys are normal, without renal calculi, solid lesion, or hydronephrosis. Stomach/Bowel: Stomach is within normal limits. No evidence of bowel wall thickening, distention, or inflammatory changes. Vascular/Lymphatic: No significant vascular findings are present. No enlarged abdominal lymph nodes. Other: No abdominal wall hernia or abnormality. No ascites. Musculoskeletal: No acute or significant osseous findings. IMPRESSION: 1. Multiple (approximately 10) new and enlarged rim enhancing, diffusion restricting liver metastases. 2. Hepatomegaly. Electronically Signed   By: Marolyn JONETTA Jaksch M.D.   On: 01/06/2025 16:41    Medications: I have reviewed the patient's current medications.   Assessment/Plan: Colon cancer, cecum, stage IIIb (pT4a, PN1a), Ileocecectomy 04/12/2024-moderately differentiated adenocarcinoma of the cecum, no macroscopic tumor perforation, carcinoma invades into the serosal surface, no lymphovascular perineural invasion, negative resection margins, 1/3 nodes, 1 tumor deposit, normal mismatch repair  protein expression, microsatellite stable Foundation 1: MSS, tumor mutation burden 7, HRD  signature negative, KRAS G 12D CT abdomen/pelvis 04/11/2024: Dilated appendix with extensive periappendiceal inflammatory changes and a 5 cm fluid collection, no enlarged abdominal pelvic lymph nodes, unchanged 9 mm right adrenal nodule from 2018 CT chest 04/21/2024: 1.4 cm anterior mediastinal nodule or node, bilateral tiny nonspecific pulmonary nodules PET scan 06/08/2024-developing metastatic lesions right hemiabdominal mesenteric lymph nodes as well as at least 2 liver metastases.  Anterior superior mediastinal soft tissue nodule is similar to the previous chest CT, no abnormal uptake. Cycle 1 FOLFOX 06/12/2024 MRI liver 06/19/2024-numerous small rim-enhancing hypovascular masses seen throughout the liver.  Right lower quadrant mesenteric lymphadenopathy measuring 1.8 cm. Cycle 2 FOLFOX 06/25/2024 Cycle 3 FOLFOX 07/12/2024 Cycle 4 FOLFOX 07/26/2024, oxaliplatin  dose reduced due to persistent cold sensitivity and mild thrombocytopenia Cycle 5 FOLFOX 08/09/2024 Cycle 6 FOLFOX 08/30/2024 Treatment held 09/13/2024 due to neuropathy, diarrhea following cycle 6, tearing MRI 09/21/2024-significant interval decrease in size and conspicuity of the numerous bilobar hepatic lesions.  No new lesions identified.  Decreased size of right lower quadrant mesenteric lymph nodes. Cycle 7 FOLFOX 09/24/2024, 5-fluorouracil  dose reduced due to diarrhea and tearing Cycle 8 FOLFOX 10/09/2024 Cycle 9 FOLFOX 10/22/2024 Cycle 10 FOLFOX 11/05/2024, oxaliplatin  held due to asthenia/fatigue, Udenyca  held Cycle 11 FOLFOX 12/06/2024, oxaliplatin  held due to neuropathy, Udenyca  held 12/14/2024 CT chest: Stable well-circumscribed anterior mediastinal lesion, stable small lung nodules, no suspicious lung nodule Cycle 12 FOLFOX 12/17/2024, oxaliplatin  held due to neuropathy, 5-FU bolus held due to diarrhea, Udenyca  held 01/05/2025 MRI liver: Multiple new and enlarging rim-enhancing metastases, hepatomegaly Depression Urinary  frequency Hypertension Tremors Tachycardia Asthma Family history of breast and colon cancer Colonoscopy-needs colonoscopy approximately 6 months from diagnosis Diabetes      Disposition: Ms. Bjorn has metastatic colon cancer.  I reviewed the restaging MRI findings and images with her.  The MRI is consistent with disease progression in the liver.  The CEA is higher.  I recommend discontinuing FOLFOX.  The right sided tumor had a K-ras G 12D mutation.  I recommend FOLFIRI/bevacizumab.  We reviewed potential toxicities associated with irinotecan including the chance of nausea/vomiting, alopecia, hematologic toxicity, infection, bleeding, and diarrhea.  We reviewed the allergic reaction, hypertension, bleeding, thromboembolic disease, delayed wound healing, CNS toxicity, and nephrotoxicity associated with bevacizumab.  She agrees to proceed.  We will submit genotype testing for irinotecan metabolism.  She will be scheduled for cycle 1 FOLFIRI/bevacizumab on 01/16/2025.   I strongly encouraged her to work on getting the blood sugar under better control.  We will contact her primary provider today.  Arley Hof, MD  01/09/2025  11:18 AM   "

## 2025-01-10 ENCOUNTER — Other Ambulatory Visit: Payer: Self-pay | Admitting: Nurse Practitioner

## 2025-01-10 ENCOUNTER — Encounter: Payer: Self-pay | Admitting: Oncology

## 2025-01-10 ENCOUNTER — Other Ambulatory Visit: Payer: Self-pay

## 2025-01-10 ENCOUNTER — Other Ambulatory Visit (HOSPITAL_BASED_OUTPATIENT_CLINIC_OR_DEPARTMENT_OTHER): Payer: Self-pay

## 2025-01-10 ENCOUNTER — Other Ambulatory Visit (HOSPITAL_COMMUNITY): Payer: Self-pay

## 2025-01-10 DIAGNOSIS — C182 Malignant neoplasm of ascending colon: Secondary | ICD-10-CM

## 2025-01-10 MED ORDER — DIPHENOXYLATE-ATROPINE 2.5-0.025 MG PO TABS
1.0000 | ORAL_TABLET | Freq: Four times a day (QID) | ORAL | 0 refills | Status: AC | PRN
  Filled 2025-01-10: qty 45, 6d supply, fill #0

## 2025-01-10 NOTE — Progress Notes (Signed)
 Pharmacist Chemotherapy Monitoring - Initial Assessment    Anticipated start date: 01/16/25   The following has been reviewed per standard work regarding the patient's treatment regimen: The patient's diagnosis, treatment plan and drug doses, and organ/hematologic function Lab orders and baseline tests specific to treatment regimen  The treatment plan start date, drug sequencing, and pre-medications Prior authorization status  Patient's documented medication list, including drug-drug interaction screen and prescriptions for anti-emetics and supportive care specific to the treatment regimen The drug concentrations, fluid compatibility, administration routes, and timing of the medications to be used The patient's access for treatment and lifetime cumulative dose history, if applicable  The patient's medication allergies and previous infusion related reactions, if applicable   Changes made to treatment plan:  N/A  Follow up needed:  Pending authorization for treatment    Krystie Leiter Kreul, RPH, 01/10/2025  3:00 PM

## 2025-01-11 ENCOUNTER — Telehealth: Payer: Self-pay

## 2025-01-11 ENCOUNTER — Inpatient Hospital Stay

## 2025-01-11 ENCOUNTER — Telehealth: Payer: Self-pay | Admitting: Oncology

## 2025-01-11 NOTE — Telephone Encounter (Signed)
 Eleanor Lamer PCP from [facility] returned phone call in regards to patient being seen in our office on 01/09/25, per  CBG-503. Dr. Cloretta requested reaching out to PCP to make aware, adjustments on Lantus , Metformin  would need to be done. PCP stated patient was seen in their office on 01/04/25, CBG was unable to complete due to glucose monitor being unable to read. Metformin  was 500 mg PO BID increased to 1,000 mgs PO BID, stated new prescription was sent to pharmacy, patient had already picked up prescription that was written in the ED that does not include the increased changes in the medication, PCP has attempted to contacted patient to follow-up on being complaint with medication adjustments, PCP also increased Lantus  from 10 units to 12 units at bedtime. [id number]- Attempted to reach patient to follow-up with PCP, will send mychart message, PCP is made aware.

## 2025-01-11 NOTE — Telephone Encounter (Signed)
 PT called to see if her txt date  was correct, reached out to Nurse and confirmed times and dates with PT.

## 2025-01-13 ENCOUNTER — Other Ambulatory Visit: Payer: Self-pay | Admitting: Oncology

## 2025-01-14 ENCOUNTER — Encounter: Payer: Self-pay | Admitting: Oncology

## 2025-01-14 ENCOUNTER — Inpatient Hospital Stay

## 2025-01-14 ENCOUNTER — Inpatient Hospital Stay: Admitting: Nutrition

## 2025-01-14 ENCOUNTER — Inpatient Hospital Stay: Admitting: Nurse Practitioner

## 2025-01-14 NOTE — Telephone Encounter (Signed)
 Erin Bradford called to verify her appointment date/time. Was provided this information for 01/16/25 at 11:00. Also made her aware that daughter has called to ask for update on status based on visit on 01/09/25. Erin Bradford told nurse that daughter will be at 1/21 visit and can be updated at that time.

## 2025-01-16 ENCOUNTER — Other Ambulatory Visit (HOSPITAL_BASED_OUTPATIENT_CLINIC_OR_DEPARTMENT_OTHER): Payer: Self-pay

## 2025-01-16 ENCOUNTER — Inpatient Hospital Stay

## 2025-01-16 ENCOUNTER — Inpatient Hospital Stay (HOSPITAL_BASED_OUTPATIENT_CLINIC_OR_DEPARTMENT_OTHER): Admitting: Oncology

## 2025-01-16 ENCOUNTER — Other Ambulatory Visit: Payer: Self-pay | Admitting: Nurse Practitioner

## 2025-01-16 ENCOUNTER — Telehealth: Payer: Self-pay | Admitting: Nurse Practitioner

## 2025-01-16 ENCOUNTER — Inpatient Hospital Stay: Admitting: Nurse Practitioner

## 2025-01-16 VITALS — BP 118/68 | HR 69 | Temp 97.3°F | Resp 18

## 2025-01-16 VITALS — BP 121/80 | HR 73 | Temp 97.7°F | Resp 18 | Ht 66.0 in | Wt 239.9 lb

## 2025-01-16 DIAGNOSIS — C182 Malignant neoplasm of ascending colon: Secondary | ICD-10-CM

## 2025-01-16 DIAGNOSIS — Z5111 Encounter for antineoplastic chemotherapy: Secondary | ICD-10-CM | POA: Diagnosis not present

## 2025-01-16 LAB — CBC WITH DIFFERENTIAL (CANCER CENTER ONLY)
Abs Immature Granulocytes: 0.03 K/uL (ref 0.00–0.07)
Basophils Absolute: 0.1 K/uL (ref 0.0–0.1)
Basophils Relative: 1 %
Eosinophils Absolute: 0.3 K/uL (ref 0.0–0.5)
Eosinophils Relative: 4 %
HCT: 34.6 % — ABNORMAL LOW (ref 36.0–46.0)
Hemoglobin: 12 g/dL (ref 12.0–15.0)
Immature Granulocytes: 0 %
Lymphocytes Relative: 18 %
Lymphs Abs: 1.6 K/uL (ref 0.7–4.0)
MCH: 30.8 pg (ref 26.0–34.0)
MCHC: 34.7 g/dL (ref 30.0–36.0)
MCV: 88.7 fL (ref 80.0–100.0)
Monocytes Absolute: 0.8 K/uL (ref 0.1–1.0)
Monocytes Relative: 9 %
Neutro Abs: 6 K/uL (ref 1.7–7.7)
Neutrophils Relative %: 68 %
Platelet Count: 326 K/uL (ref 150–400)
RBC: 3.9 MIL/uL (ref 3.87–5.11)
RDW: 12.4 % (ref 11.5–15.5)
WBC Count: 8.8 K/uL (ref 4.0–10.5)
nRBC: 0 % (ref 0.0–0.2)

## 2025-01-16 LAB — CMP (CANCER CENTER ONLY)
ALT: 16 U/L (ref 0–44)
AST: 20 U/L (ref 15–41)
Albumin: 4.2 g/dL (ref 3.5–5.0)
Alkaline Phosphatase: 148 U/L — ABNORMAL HIGH (ref 38–126)
Anion gap: 13 (ref 5–15)
BUN: 8 mg/dL (ref 6–20)
CO2: 25 mmol/L (ref 22–32)
Calcium: 9.8 mg/dL (ref 8.9–10.3)
Chloride: 96 mmol/L — ABNORMAL LOW (ref 98–111)
Creatinine: 0.9 mg/dL (ref 0.44–1.00)
GFR, Estimated: >60 mL/min
Glucose, Bld: 428 mg/dL — ABNORMAL HIGH (ref 70–99)
Potassium: 3.8 mmol/L (ref 3.5–5.1)
Sodium: 133 mmol/L — ABNORMAL LOW (ref 135–145)
Total Bilirubin: 0.3 mg/dL (ref 0.0–1.2)
Total Protein: 7 g/dL (ref 6.5–8.1)

## 2025-01-16 LAB — MISC LABCORP TEST (SEND OUT): Labcorp test code: 511200

## 2025-01-16 LAB — TOTAL PROTEIN, URINE DIPSTICK: Protein, ur: NEGATIVE mg/dL

## 2025-01-16 MED ORDER — SODIUM CHLORIDE 0.9 % IV SOLN: INTRAVENOUS | Status: DC

## 2025-01-16 MED ORDER — SODIUM CHLORIDE 0.9 % IV SOLN
5.0000 mg/kg | Freq: Once | INTRAVENOUS | Status: AC
  Administered 2025-01-16: 500 mg via INTRAVENOUS
  Filled 2025-01-16: qty 16

## 2025-01-16 MED ORDER — PALONOSETRON HCL INJECTION 0.25 MG/5ML
0.2500 mg | Freq: Once | INTRAVENOUS | Status: AC
  Administered 2025-01-16: 0.25 mg via INTRAVENOUS
  Filled 2025-01-16: qty 5

## 2025-01-16 MED ORDER — SODIUM CHLORIDE 0.9 % IV SOLN
1800.0000 mg/m2 | INTRAVENOUS | Status: DC
  Administered 2025-01-16: 4050 mg via INTRAVENOUS
  Filled 2025-01-16: qty 81

## 2025-01-16 MED ORDER — SODIUM CHLORIDE 0.9 % IV SOLN
300.0000 mg/m2 | Freq: Once | INTRAVENOUS | Status: DC
  Filled 2025-01-16: qty 33.8

## 2025-01-16 MED ORDER — SODIUM CHLORIDE 0.9 % IV SOLN
300.0000 mg/m2 | Freq: Once | INTRAVENOUS | Status: AC
  Administered 2025-01-16: 676 mg via INTRAVENOUS
  Filled 2025-01-16: qty 33.8

## 2025-01-16 MED ORDER — DEXAMETHASONE SOD PHOSPHATE PF 10 MG/ML IJ SOLN
5.0000 mg | Freq: Once | INTRAMUSCULAR | Status: AC
  Administered 2025-01-16: 5 mg via INTRAVENOUS
  Filled 2025-01-16: qty 1

## 2025-01-16 MED ORDER — SODIUM CHLORIDE 0.9 % IV SOLN
135.0000 mg/m2 | Freq: Once | INTRAVENOUS | Status: AC
  Administered 2025-01-16: 300 mg via INTRAVENOUS
  Filled 2025-01-16: qty 15

## 2025-01-16 MED ORDER — ATROPINE SULFATE 1 MG/ML IV SOLN
0.5000 mg | Freq: Once | INTRAVENOUS | Status: AC | PRN
  Administered 2025-01-16: 0.5 mg via INTRAVENOUS
  Filled 2025-01-16: qty 1

## 2025-01-16 NOTE — Patient Instructions (Addendum)
 [facility] - A DEPT OF [facility]  Discharge Instructions: Thank you for choosing [facility] to provide your oncology and hematology care.   If you have a lab appointment with the Cancer Center, please go directly to the Cancer Center and check in at the registration area.   Wear comfortable clothing and clothing appropriate for easy access to any Portacath or PICC line.   We strive to give you quality time with your provider. You may need to reschedule your appointment if you arrive late (15 or more minutes).  Arriving late affects you and other patients whose appointments are after yours.  Also, if you miss three or more appointments without notifying the office, you may be dismissed from the clinic at the providers discretion.      For prescription refill requests, have your pharmacy contact our office and allow 72 hours for refills to be completed.    Today you received the following chemotherapy and/or immunotherapy agents: bevacizumab , irinotecan , leucovorin , fluorouracil        To help prevent nausea and vomiting after your treatment, we encourage you to take your nausea medication as directed.  BELOW ARE SYMPTOMS THAT SHOULD BE REPORTED IMMEDIATELY: *FEVER GREATER THAN 100.4 F (38 C) OR HIGHER *CHILLS OR SWEATING *NAUSEA AND VOMITING THAT IS NOT CONTROLLED WITH YOUR NAUSEA MEDICATION *UNUSUAL SHORTNESS OF BREATH *UNUSUAL BRUISING OR BLEEDING *URINARY PROBLEMS (pain or burning when urinating, or frequent urination) *BOWEL PROBLEMS (unusual diarrhea, constipation, pain near the anus) TENDERNESS IN MOUTH AND THROAT WITH OR WITHOUT PRESENCE OF ULCERS (sore throat, sores in mouth, or a toothache) UNUSUAL RASH, SWELLING OR PAIN  UNUSUAL VAGINAL DISCHARGE OR ITCHING   Items with * indicate a potential emergency and should be followed up as soon as possible or go to the Emergency Department if any problems should occur.  Please show the  CHEMOTHERAPY ALERT CARD or IMMUNOTHERAPY ALERT CARD at check-in to the Emergency Department and triage nurse.  Should you have questions after your visit or need to cancel or reschedule your appointment, please contact [facility] - A DEPT OF [facility]  Dept: [phone]  and follow the prompts.  Office hours are 8:00 a.m. to 4:30 p.m. Monday - Friday. Please note that voicemails left after 4:00 p.m. may not be returned until the following business day.  We are closed weekends and major holidays. You have access to a nurse at all times for urgent questions. Please call the main number to the clinic Dept: [phone] and follow the prompts.   For any non-urgent questions, you may also contact your provider using MyChart. We now offer e-Visits for anyone 37 and older to request care online for non-urgent symptoms. For details visit [email or web address].   Also download the MyChart app! Go to the app store, search MyChart, open the app, select [place], and log in with your MyChart username and password. Bevacizumab  Injection What is this medication? BEVACIZUMAB  (be va SIZ yoo mab) treats some types of cancer. It works by blocking a protein that causes cancer cells to grow and multiply. This helps to slow or stop the spread of cancer cells. It is a monoclonal antibody. This medicine may be used for other purposes; ask your health care provider or pharmacist if you have questions. COMMON BRAND NAME(S): Alymsys , Avastin , MVASI , Vegzalma, Zirabev  What should I tell my care team before I take this medication? They need to know if you have  any of these conditions: Blood clots Coughing up blood Having or recent surgery Heart failure High blood pressure History of a connection between 2 or more body parts that do not usually connect (fistula) History of a tear in your stomach or intestines Protein in your urine An unusual or allergic reaction to bevacizumab ,  other medications, foods, dyes, or preservatives Pregnant or trying to get pregnant Breast-feeding How should I use this medication? This medication is injected into a vein. It is given by your care team in a hospital or clinic setting. Talk to your care team the use of this medication in children. Special care may be needed. Overdosage: If you think you have taken too much of this medicine contact a poison control center or emergency room at once. NOTE: This medicine is only for you. Do not share this medicine with others. What if I miss a dose? Keep appointments for follow-up doses. It is important not to miss your dose. Call your care team if you are unable to keep an appointment. What may interact with this medication? Interactions are not expected. This list may not describe all possible interactions. Give your health care provider a list of all the medicines, herbs, non-prescription drugs, or dietary supplements you use. Also tell them if you smoke, drink alcohol , or use illegal drugs. Some items may interact with your medicine. What should I watch for while using this medication? Your condition will be monitored carefully while you are receiving this medication. You may need blood work while taking this medication. This medication may make you feel generally unwell. This is not uncommon as chemotherapy can affect healthy cells as well as cancer cells. Report any side effects. Continue your course of treatment even though you feel ill unless your care team tells you to stop. This medication may increase your risk to bruise or bleed. Call your care team if you notice any unusual bleeding. Before having surgery, talk to your care team to make sure it is ok. This medication can increase the risk of poor healing of your surgical site or wound. You will need to stop this medication for 28 days before surgery. After surgery, wait at least 28 days before restarting this medication. Make sure the  surgical site or wound is healed enough before restarting this medication. Talk to your care team if questions. Talk to your care team if you may be pregnant. Serious birth defects can occur if you take this medication during pregnancy and for 6 months after the last dose. Contraception is recommended while taking this medication and for 6 months after the last dose. Your care team can help you find the option that works for you. Do not breastfeed while taking this medication and for 6 months after the last dose. This medication can cause infertility. Talk to your care team if you are concerned about your fertility. What side effects may I notice from receiving this medication? Side effects that you should report to your care team as soon as possible: Allergic reactions--skin rash, itching, hives, swelling of the face, lips, tongue, or throat Bleeding--bloody or black, tar-like stools, vomiting blood or brown material that looks like coffee grounds, red or dark brown urine, small red or purple spots on skin, unusual bruising or bleeding Blood clot--pain, swelling, or warmth in the leg, shortness of breath, chest pain Heart attack--pain or tightness in the chest, shoulders, arms, or jaw, nausea, shortness of breath, cold or clammy skin, feeling faint or lightheaded Heart failure--shortness of  breath, swelling of the ankles, feet, or hands, sudden weight gain, unusual weakness or fatigue Increase in blood pressure Infection--fever, chills, cough, sore throat, wounds that don't heal, pain or trouble when passing urine, general feeling of discomfort or being unwell Infusion reactions--chest pain, shortness of breath or trouble breathing, feeling faint or lightheaded Kidney injury--decrease in the amount of urine, swelling of the ankles, hands, or feet Stomach pain that is severe, does not go away, or gets worse Stroke--sudden numbness or weakness of the face, arm, or leg, trouble speaking, confusion,  trouble walking, loss of balance or coordination, dizziness, severe headache, change in vision Sudden and severe headache, confusion, change in vision, seizures, which may be signs of posterior reversible encephalopathy syndrome (PRES) Side effects that usually do not require medical attention (report to your care team if they continue or are bothersome): Back pain Change in taste Diarrhea Dry skin Increased tears Nosebleed This list may not describe all possible side effects. Call your doctor for medical advice about side effects. You may report side effects to FDA at 1-800-FDA-1088. Where should I keep my medication? This medication is given in a hospital or clinic. It will not be stored at home. NOTE: This sheet is a summary. It may not cover all possible information. If you have questions about this medicine, talk to your doctor, pharmacist, or health care provider.  2024 Elsevier/Gold Standard (2022-04-30 00:00:00)

## 2025-01-16 NOTE — Progress Notes (Signed)
 Patient seen by Dr. Arley Hof today  Vitals are within treatment parameters:Yes   Labs are within treatment parameters: No (Please specify and give further instructions.) OK to treat with glucose 428  Treatment plan has been signed: Yes   Per physician team, Patient is ready for treatment. Please note the following modifications: Dose reduction of dexamethasone  to 5 mg. OK to give 1st avastin  today before she provides urine sample. Still needs to provide sample before leaving today.   Also routed today's CMP results to new PCP, Lauraine Pereyra and requested appointment asap for uncontrolled diabetes. Also noted her difficulty with dosing the pen for 12 unit dose that was ordered by prior PCP. Placed dietician referral and provided patient Glucerna sample and coupons.

## 2025-01-16 NOTE — Progress Notes (Signed)
 " [facility] OFFICE PROGRESS NOTE   Diagnosis: Colon cancer  INTERVAL HISTORY:   Ms. Sek returns as scheduled.  She generally feels well.  Good appetite.  She is now wearing a Dexcom monitor.  She is taking insulin  and metformin .  She has obtained a new primary provider, but not yet have an appointment.  Her blood sugar remains elevated in the 300 range, but is improved.  She is following a diabetic diet. She had a recent episode of dizziness and presyncope.  She is drinking adequate fluids.  Objective:  Vital signs in last 24 hours:  Blood pressure 121/80, pulse 73, temperature 97.7 F (36.5 C), temperature source Temporal, resp. rate 18, height 5' 6 (1.676 m), weight 239 lb 14.4 oz (108.8 kg), last menstrual period 09/17/2017, SpO2 93%.    HEENT: No thrush or ulcers Resp: Clear bilaterally Cardio: Regular rate and rhythm GI: No hepatosplenomegaly, mild tenderness at the right costal margin over the rib Vascular: No leg edema  Portacath/PICC-without erythema  Lab Results:  Lab Results  Component Value Date   WBC 8.8 01/16/2025   HGB 12.0 01/16/2025   HCT 34.6 (L) 01/16/2025   MCV 88.7 01/16/2025   PLT 326 01/16/2025   NEUTROABS 6.0 01/16/2025    CMP  Lab Results  Component Value Date   NA 133 (L) 01/16/2025   K 3.8 01/16/2025   CL 96 (L) 01/16/2025   CO2 25 01/16/2025   GLUCOSE 428 (H) 01/16/2025   BUN 8 01/16/2025   CREATININE 0.90 01/16/2025   CALCIUM  9.8 01/16/2025   PROT 7.0 01/16/2025   ALBUMIN  4.2 01/16/2025   AST 20 01/16/2025   ALT 16 01/16/2025   ALKPHOS 148 (H) 01/16/2025   BILITOT 0.3 01/16/2025   GFRNONAA >60 01/16/2025   GFRAA >60 07/19/2018    Lab Results  Component Value Date   CEA 5.46 (H) 01/09/2025    No results found for: INR, LABPROT  Imaging:  No results found.  Medications: I have reviewed the patient's current medications.   Assessment/Plan: Colon cancer, cecum, stage IIIb (pT4a,  PN1a), Ileocecectomy 04/12/2024-moderately differentiated adenocarcinoma of the cecum, no macroscopic tumor perforation, carcinoma invades into the serosal surface, no lymphovascular perineural invasion, negative resection margins, 1/3 nodes, 1 tumor deposit, normal mismatch repair protein expression, microsatellite stable Foundation 1: MSS, tumor mutation burden 7, HRD signature negative, KRAS G 12D CT abdomen/pelvis 04/11/2024: Dilated appendix with extensive periappendiceal inflammatory changes and a 5 cm fluid collection, no enlarged abdominal pelvic lymph nodes, unchanged 9 mm right adrenal nodule from 2018 CT chest 04/21/2024: 1.4 cm anterior mediastinal nodule or node, bilateral tiny nonspecific pulmonary nodules PET scan 06/08/2024-developing metastatic lesions right hemiabdominal mesenteric lymph nodes as well as at least 2 liver metastases.  Anterior superior mediastinal soft tissue nodule is similar to the previous chest CT, no abnormal uptake. Cycle 1 FOLFOX 06/12/2024 MRI liver 06/19/2024-numerous small rim-enhancing hypovascular masses seen throughout the liver.  Right lower quadrant mesenteric lymphadenopathy measuring 1.8 cm. Cycle 2 FOLFOX 06/25/2024 Cycle 3 FOLFOX 07/12/2024 Cycle 4 FOLFOX 07/26/2024, oxaliplatin  dose reduced due to persistent cold sensitivity and mild thrombocytopenia Cycle 5 FOLFOX 08/09/2024 Cycle 6 FOLFOX 08/30/2024 Treatment held 09/13/2024 due to neuropathy, diarrhea following cycle 6, tearing MRI 09/21/2024-significant interval decrease in size and conspicuity of the numerous bilobar hepatic lesions.  No new lesions identified.  Decreased size of right lower quadrant mesenteric lymph nodes. Cycle 7 FOLFOX 09/24/2024, 5-fluorouracil  dose reduced due to diarrhea and tearing Cycle 8 FOLFOX  10/09/2024 Cycle 9 FOLFOX 10/22/2024 Cycle 10 FOLFOX 11/05/2024, oxaliplatin  held due to asthenia/fatigue, Udenyca  held Cycle 11 FOLFOX 12/06/2024, oxaliplatin  held due to neuropathy,  Udenyca  held 12/14/2024 CT chest: Stable well-circumscribed anterior mediastinal lesion, stable small lung nodules, no suspicious lung nodule Cycle 12 FOLFOX 12/17/2024, oxaliplatin  held due to neuropathy, 5-FU bolus held due to diarrhea, Udenyca  held 01/05/2025 MRI liver: Multiple new and enlarging rim-enhancing metastases, hepatomegaly 01/16/2025 FOLFIRI/bevacizumab   Depression Urinary frequency Hypertension Tremors Tachycardia Asthma Family history of breast and colon cancer Colonoscopy-needs colonoscopy approximately 6 months from diagnosis Diabetes       Disposition: Ms Colt appears stable.  She has persistent hyperglycemia, but is now wearing a Dexcom and has a provider for management of diabetes.  We will contact the primary provider to request an urgent visit and an insulin  sliding scale.  She will complete cycle 1 FOLFIRI/bevacizumab  today.  I decreased the Decadron  dose in the chemotherapy premedication regimen in an attempt to avoid further hyperglycemia.  She will return for an office visit and chemotherapy in 2 weeks.  I encouraged her to follow a diabetic diet.  We made a referral to the Cancer center nutritionist.  Arley Hof, MD  01/16/2025  2:13 PM    "

## 2025-01-16 NOTE — Patient Instructions (Signed)

## 2025-01-16 NOTE — Telephone Encounter (Signed)
 PT's daughter called to let us  know that they will be running a few minutes behind.

## 2025-01-18 ENCOUNTER — Inpatient Hospital Stay

## 2025-01-18 VITALS — BP 116/76 | HR 74 | Temp 98.3°F | Resp 18

## 2025-01-18 DIAGNOSIS — C182 Malignant neoplasm of ascending colon: Secondary | ICD-10-CM

## 2025-01-18 DIAGNOSIS — Z5111 Encounter for antineoplastic chemotherapy: Secondary | ICD-10-CM | POA: Diagnosis not present

## 2025-01-18 MED ORDER — PEGFILGRASTIM-CBQV 6 MG/0.6ML ~~LOC~~ SOSY
6.0000 mg | PREFILLED_SYRINGE | Freq: Once | SUBCUTANEOUS | Status: AC
  Administered 2025-01-18: 6 mg via SUBCUTANEOUS

## 2025-01-18 NOTE — Patient Instructions (Signed)

## 2025-01-23 ENCOUNTER — Inpatient Hospital Stay: Admitting: Nurse Practitioner

## 2025-01-23 ENCOUNTER — Inpatient Hospital Stay

## 2025-01-25 ENCOUNTER — Inpatient Hospital Stay

## 2025-01-28 ENCOUNTER — Other Ambulatory Visit: Payer: Self-pay | Admitting: Oncology

## 2025-01-30 ENCOUNTER — Other Ambulatory Visit (HOSPITAL_BASED_OUTPATIENT_CLINIC_OR_DEPARTMENT_OTHER): Payer: Self-pay

## 2025-01-30 ENCOUNTER — Inpatient Hospital Stay

## 2025-01-30 ENCOUNTER — Telehealth: Payer: Self-pay

## 2025-01-30 ENCOUNTER — Inpatient Hospital Stay: Admitting: Nurse Practitioner

## 2025-01-30 VITALS — BP 140/102 | HR 89 | Temp 97.9°F | Resp 18 | Ht 66.0 in | Wt 235.9 lb

## 2025-01-30 DIAGNOSIS — C182 Malignant neoplasm of ascending colon: Secondary | ICD-10-CM

## 2025-01-30 DIAGNOSIS — J45909 Unspecified asthma, uncomplicated: Secondary | ICD-10-CM

## 2025-01-30 DIAGNOSIS — I10 Essential (primary) hypertension: Secondary | ICD-10-CM

## 2025-01-30 DIAGNOSIS — Z803 Family history of malignant neoplasm of breast: Secondary | ICD-10-CM

## 2025-01-30 DIAGNOSIS — R350 Frequency of micturition: Secondary | ICD-10-CM | POA: Diagnosis not present

## 2025-01-30 DIAGNOSIS — R000 Tachycardia, unspecified: Secondary | ICD-10-CM

## 2025-01-30 DIAGNOSIS — C180 Malignant neoplasm of cecum: Secondary | ICD-10-CM | POA: Diagnosis not present

## 2025-01-30 DIAGNOSIS — C787 Secondary malignant neoplasm of liver and intrahepatic bile duct: Secondary | ICD-10-CM | POA: Diagnosis not present

## 2025-01-30 DIAGNOSIS — Z800 Family history of malignant neoplasm of digestive organs: Secondary | ICD-10-CM | POA: Diagnosis not present

## 2025-01-30 DIAGNOSIS — R251 Tremor, unspecified: Secondary | ICD-10-CM | POA: Diagnosis not present

## 2025-01-30 DIAGNOSIS — F32A Depression, unspecified: Secondary | ICD-10-CM | POA: Diagnosis not present

## 2025-01-30 DIAGNOSIS — E119 Type 2 diabetes mellitus without complications: Secondary | ICD-10-CM | POA: Diagnosis not present

## 2025-01-30 LAB — CMP (CANCER CENTER ONLY)
ALT: 13 U/L (ref 0–44)
AST: 18 U/L (ref 15–41)
Albumin: 4.2 g/dL (ref 3.5–5.0)
Alkaline Phosphatase: 183 U/L — ABNORMAL HIGH (ref 38–126)
Anion gap: 14 (ref 5–15)
BUN: 7 mg/dL (ref 6–20)
CO2: 26 mmol/L (ref 22–32)
Calcium: 9.4 mg/dL (ref 8.9–10.3)
Chloride: 102 mmol/L (ref 98–111)
Creatinine: 0.88 mg/dL (ref 0.44–1.00)
GFR, Estimated: >60 mL/min
Glucose, Bld: 216 mg/dL — ABNORMAL HIGH (ref 70–99)
Potassium: 2.9 mmol/L — ABNORMAL LOW (ref 3.5–5.1)
Sodium: 141 mmol/L (ref 135–145)
Total Bilirubin: 0.4 mg/dL (ref 0.0–1.2)
Total Protein: 7 g/dL (ref 6.5–8.1)

## 2025-01-30 LAB — CBC WITH DIFFERENTIAL (CANCER CENTER ONLY)
Abs Immature Granulocytes: 0.28 10*3/uL — ABNORMAL HIGH (ref 0.00–0.07)
Basophils Absolute: 0.1 10*3/uL (ref 0.0–0.1)
Basophils Relative: 1 %
Eosinophils Absolute: 0.2 10*3/uL (ref 0.0–0.5)
Eosinophils Relative: 2 %
HCT: 38.2 % (ref 36.0–46.0)
Hemoglobin: 12.6 g/dL (ref 12.0–15.0)
Immature Granulocytes: 3 %
Lymphocytes Relative: 24 %
Lymphs Abs: 2.2 10*3/uL (ref 0.7–4.0)
MCH: 30.1 pg (ref 26.0–34.0)
MCHC: 33 g/dL (ref 30.0–36.0)
MCV: 91.4 fL (ref 80.0–100.0)
Monocytes Absolute: 0.7 10*3/uL (ref 0.1–1.0)
Monocytes Relative: 7 %
Neutro Abs: 5.8 10*3/uL (ref 1.7–7.7)
Neutrophils Relative %: 63 %
Platelet Count: 187 10*3/uL (ref 150–400)
RBC: 4.18 MIL/uL (ref 3.87–5.11)
RDW: 14.1 % (ref 11.5–15.5)
WBC Count: 9.2 10*3/uL (ref 4.0–10.5)
nRBC: 0 % (ref 0.0–0.2)

## 2025-01-30 LAB — MAGNESIUM: Magnesium: 1.4 mg/dL — ABNORMAL LOW (ref 1.7–2.4)

## 2025-01-30 MED ORDER — MAGNESIUM SULFATE 2 GM/50ML IV SOLN
2.0000 g | Freq: Once | INTRAVENOUS | Status: AC
  Administered 2025-01-30: 2 g via INTRAVENOUS
  Filled 2025-01-30: qty 50

## 2025-01-30 MED ORDER — SODIUM CHLORIDE 0.9 % IV SOLN: Freq: Once | INTRAVENOUS | Status: AC

## 2025-01-30 MED ORDER — PROCHLORPERAZINE MALEATE 10 MG PO TABS
10.0000 mg | ORAL_TABLET | Freq: Four times a day (QID) | ORAL | 3 refills | Status: AC | PRN
  Filled 2025-01-30: qty 30, 8d supply, fill #0

## 2025-01-30 MED ORDER — POTASSIUM CHLORIDE 10 MEQ/100ML IV SOLN
10.0000 meq | INTRAVENOUS | Status: AC
  Administered 2025-01-30 (×4): 10 meq via INTRAVENOUS
  Filled 2025-01-30: qty 100

## 2025-01-30 MED ORDER — POTASSIUM CHLORIDE CRYS ER 20 MEQ PO TBCR
20.0000 meq | EXTENDED_RELEASE_TABLET | Freq: Two times a day (BID) | ORAL | 1 refills | Status: AC
  Filled 2025-01-30: qty 36, 18d supply, fill #0

## 2025-01-30 MED ORDER — ONDANSETRON HCL 8 MG PO TABS
8.0000 mg | ORAL_TABLET | Freq: Once | ORAL | Status: AC
  Administered 2025-01-30: 8 mg via ORAL
  Filled 2025-01-30: qty 1

## 2025-01-30 NOTE — Patient Instructions (Signed)

## 2025-01-30 NOTE — Progress Notes (Signed)
 Patient seen by Olam Ned NP today  Vitals are within treatment parameters:Yes   Labs are within treatment parameters: Yes Called Lab and added Magnesium .  Treatment plan has been signed: Yes   Per physician team, Patient will not be receiving treatment today. She will be receiving 1 L NS IVF and 40 meq KCL.

## 2025-01-30 NOTE — Progress Notes (Signed)
 " [facility] OFFICE PROGRESS NOTE   Diagnosis: Colon cancer  INTERVAL HISTORY:   Erin Bradford returns as scheduled.  She completed cycle 1 FOLFIRI/bevacizumab  01/16/2025.  She began having diarrhea day 1 during the chemotherapy.  The diarrhea has persisted.  She estimates 4-6 watery bowel movements a day.  She is taking Lomotil  with slight improvement.  She has taken up to 4 Lomotil  tablets in a 24-hour timeframe.  She notes she is passing something in her stool, estimates 20 abnormal shaped objects.  She does not feel these are pill fragments or anything she has ingested.  She developed nausea day 2.  She continues to have intermittent nausea.  She had a fever of 101.2 on day 2.  She took Tylenol .  No infection symptoms.  No further fever.  No mouth sores.  She continues to have left neck and shoulder pain.  The pain worsened about 4 days ago.  She feels like her shoulder is out of socket.  No known injury or unusual activity.  She reports improved blood sugar control.  Objective:  Vital signs in last 24 hours:  Blood pressure (!) 140/102, pulse 89, temperature 97.9 F (36.6 C), temperature source Temporal, resp. rate 18, height 5' 6 (1.676 m), weight 235 lb 14.4 oz (107 kg), last menstrual period 09/17/2017, SpO2 96%.    HEENT: No thrush or ulcers. Resp: Lungs clear bilaterally. Cardio: Regular rate and rhythm. GI: No hepatomegaly. Vascular: No leg edema.  Left arm appears slightly larger than the right arm.  No prominence of the vein pattern across the upper chest/back.   Musculoskeletal: Decreased range of motion left shoulder. Neuro: Upper extremity motor strength is intact. Skin: Palms without erythema. Port-A-Cath without erythema.  Lab Results:  Lab Results  Component Value Date   WBC 9.2 01/30/2025   HGB 12.6 01/30/2025   HCT 38.2 01/30/2025   MCV 91.4 01/30/2025   PLT 187 01/30/2025   NEUTROABS 5.8 01/30/2025    Imaging:  No results  found.  Medications: I have reviewed the patient's current medications.  Assessment/Plan: Colon cancer, cecum, stage IIIb (pT4a, PN1a), Ileocecectomy 04/12/2024-moderately differentiated adenocarcinoma of the cecum, no macroscopic tumor perforation, carcinoma invades into the serosal surface, no lymphovascular perineural invasion, negative resection margins, 1/3 nodes, 1 tumor deposit, normal mismatch repair protein expression, microsatellite stable Foundation 1: MSS, tumor mutation burden 7, HRD signature negative, KRAS G 12D CT abdomen/pelvis 04/11/2024: Dilated appendix with extensive periappendiceal inflammatory changes and a 5 cm fluid collection, no enlarged abdominal pelvic lymph nodes, unchanged 9 mm right adrenal nodule from 2018 CT chest 04/21/2024: 1.4 cm anterior mediastinal nodule or node, bilateral tiny nonspecific pulmonary nodules PET scan 06/08/2024-developing metastatic lesions right hemiabdominal mesenteric lymph nodes as well as at least 2 liver metastases.  Anterior superior mediastinal soft tissue nodule is similar to the previous chest CT, no abnormal uptake. Cycle 1 FOLFOX 06/12/2024 MRI liver 06/19/2024-numerous small rim-enhancing hypovascular masses seen throughout the liver.  Right lower quadrant mesenteric lymphadenopathy measuring 1.8 cm. Cycle 2 FOLFOX 06/25/2024 Cycle 3 FOLFOX 07/12/2024 Cycle 4 FOLFOX 07/26/2024, oxaliplatin  dose reduced due to persistent cold sensitivity and mild thrombocytopenia Cycle 5 FOLFOX 08/09/2024 Cycle 6 FOLFOX 08/30/2024 Treatment held 09/13/2024 due to neuropathy, diarrhea following cycle 6, tearing MRI 09/21/2024-significant interval decrease in size and conspicuity of the numerous bilobar hepatic lesions.  No new lesions identified.  Decreased size of right lower quadrant mesenteric lymph nodes. Cycle 7 FOLFOX 09/24/2024, 5-fluorouracil  dose reduced due to diarrhea and  tearing Cycle 8 FOLFOX 10/09/2024 Cycle 9 FOLFOX 10/22/2024 Cycle 10 FOLFOX  11/05/2024, oxaliplatin  held due to asthenia/fatigue, Udenyca  held Cycle 11 FOLFOX 12/06/2024, oxaliplatin  held due to neuropathy, Udenyca  held 12/14/2024 CT chest: Stable well-circumscribed anterior mediastinal lesion, stable small lung nodules, no suspicious lung nodule Cycle 12 FOLFOX 12/17/2024, oxaliplatin  held due to neuropathy, 5-FU bolus held due to diarrhea, Udenyca  held 01/05/2025 MRI liver: Multiple new and enlarging rim-enhancing metastases, hepatomegaly 01/16/2025 FOLFIRI/bevacizumab  Treatment held 01/30/2025 due to diarrhea, received IV fluids and IV potassium   Depression Urinary frequency Hypertension Tremors Tachycardia Asthma Family history of breast and colon cancer Colonoscopy-needs colonoscopy approximately 6 months from diagnosis Diabetes Homozygous UGT1A1    Disposition: Erin Bradford appears stable.  She has completed 1 cycle of FOLFIRI/bevacizumab .  She continues to have significant diarrhea, poorly controlled.  We are holding today's treatment and rescheduling for 1 week.  She will receive IV fluids with potassium.  We reviewed dosing instructions for Imodium  and Lomotil .  She will begin oral potassium tomorrow.  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  She understands this could indicate slow metabolism of irinotecan .  Irinotecan  was dose reduced with cycle 1, plan further dose reduction with cycle 2.  Plain x-ray of the left shoulder to evaluate increased pain.  She requests referral to a liver surgeon to see if she is a candidate for resection.  She understands it is unlikely resection will be recommended.  We discussed evaluation for hepatic infusion pump.  She is not interested in the pump and would prefer to see someone in [place].  Referral placed to Dr. Dasie.  Etiology of abnormal appearing stool is unclear.  Question pill fragment or related to dietary intake.  She will contact the office if this persists and we will refer to GI.  She  will return for follow-up and possible treatment in 1 week.  We are available to see her sooner if needed.  Patient seen with Dr. Cloretta.  01/05/2025 MRI report/images reviewed with Erin Bradford and her family.    Olam Ned ANP/GNP-BC   01/30/2025  10:16 AM  This was a shared visit with Olam Ned.  Erin Bradford completed 1 cycle of FOLFIRI/bevacizumab .  She has persistent diarrhea.  Genotype testing revealed she is predicted to be a slow metabolizer of irinotecan .  Irinotecan  was dose reduced with cycle 1.  We plan to further dose reduced irinotecan  with cycle 2.  We recommended she use both Imodium  and Lomotil  for diarrhea.  She was treated with intravenous fluids and potassium replacement today.  She requested a surgical consultation to consider resection of liver lesions.  We reviewed the MRI images with her.  I think it is unlikely she will be a surgical candidate based on the number and distribution of the liver metastases.  Arvella Cloretta, MD      "

## 2025-01-30 NOTE — Telephone Encounter (Signed)
 Spoke with pt daughter in regards to her FMLA forms being completed,faxed,and confirmation received. Pt daughter verbalized understanding. Her copy was emailed upon request.

## 2025-02-01 ENCOUNTER — Inpatient Hospital Stay

## 2025-02-04 ENCOUNTER — Inpatient Hospital Stay: Admitting: Nutrition

## 2025-02-04 ENCOUNTER — Inpatient Hospital Stay: Admitting: Nurse Practitioner

## 2025-02-04 ENCOUNTER — Inpatient Hospital Stay

## 2025-02-05 ENCOUNTER — Inpatient Hospital Stay

## 2025-02-05 ENCOUNTER — Inpatient Hospital Stay: Admitting: Nurse Practitioner

## 2025-02-06 ENCOUNTER — Inpatient Hospital Stay

## 2025-02-07 ENCOUNTER — Inpatient Hospital Stay

## 2025-02-08 ENCOUNTER — Inpatient Hospital Stay

## 2025-02-13 ENCOUNTER — Inpatient Hospital Stay

## 2025-02-13 ENCOUNTER — Inpatient Hospital Stay: Admitting: Oncology

## 2025-02-15 ENCOUNTER — Inpatient Hospital Stay

## 2025-02-20 ENCOUNTER — Inpatient Hospital Stay

## 2025-02-20 ENCOUNTER — Inpatient Hospital Stay: Admitting: Nurse Practitioner

## 2025-02-22 ENCOUNTER — Inpatient Hospital Stay

## 2025-04-01 ENCOUNTER — Telehealth (HOSPITAL_COMMUNITY): Admitting: Psychiatry
